# Patient Record
Sex: Female | Born: 1950 | Race: White | Hispanic: No | Smoking: Current every day smoker
Health system: Southern US, Community
[De-identification: ages and names within clinical notes are randomized; demographics above are authoritative.]

## PROBLEM LIST (undated history)

## (undated) ENCOUNTER — Emergency Department (HOSPITAL_COMMUNITY): Discharge status: Absent without leave | Payer: Medicare Other | Source: Home / Self Care

## (undated) DIAGNOSIS — IMO0001 Reserved for inherently not codable concepts without codable children: Secondary | ICD-10-CM

## (undated) DIAGNOSIS — M109 Gout, unspecified: Secondary | ICD-10-CM

## (undated) DIAGNOSIS — E1140 Type 2 diabetes mellitus with diabetic neuropathy, unspecified: Secondary | ICD-10-CM

## (undated) DIAGNOSIS — K219 Gastro-esophageal reflux disease without esophagitis: Secondary | ICD-10-CM

## (undated) DIAGNOSIS — F329 Major depressive disorder, single episode, unspecified: Secondary | ICD-10-CM

## (undated) DIAGNOSIS — R519 Headache, unspecified: Secondary | ICD-10-CM

## (undated) DIAGNOSIS — M1990 Unspecified osteoarthritis, unspecified site: Secondary | ICD-10-CM

## (undated) DIAGNOSIS — L853 Xerosis cutis: Secondary | ICD-10-CM

## (undated) DIAGNOSIS — G629 Polyneuropathy, unspecified: Secondary | ICD-10-CM

## (undated) DIAGNOSIS — D649 Anemia, unspecified: Secondary | ICD-10-CM

## (undated) DIAGNOSIS — K5792 Diverticulitis of intestine, part unspecified, without perforation or abscess without bleeding: Secondary | ICD-10-CM

## (undated) DIAGNOSIS — F32A Depression, unspecified: Secondary | ICD-10-CM

## (undated) DIAGNOSIS — K5900 Constipation, unspecified: Secondary | ICD-10-CM

## (undated) DIAGNOSIS — H547 Unspecified visual loss: Secondary | ICD-10-CM

## (undated) DIAGNOSIS — I10 Essential (primary) hypertension: Secondary | ICD-10-CM

## (undated) DIAGNOSIS — F419 Anxiety disorder, unspecified: Secondary | ICD-10-CM

## (undated) DIAGNOSIS — N189 Chronic kidney disease, unspecified: Secondary | ICD-10-CM

## (undated) DIAGNOSIS — K922 Gastrointestinal hemorrhage, unspecified: Secondary | ICD-10-CM

## (undated) DIAGNOSIS — R51 Headache: Secondary | ICD-10-CM

## (undated) DIAGNOSIS — C4490 Unspecified malignant neoplasm of skin, unspecified: Secondary | ICD-10-CM

## (undated) HISTORY — PX: TONSILLECTOMY: SUR1361

## (undated) HISTORY — DX: Gastro-esophageal reflux disease without esophagitis: K21.9

## (undated) HISTORY — DX: Unspecified malignant neoplasm of skin, unspecified: C44.90

## (undated) HISTORY — DX: Gastrointestinal hemorrhage, unspecified: K92.2

## (undated) HISTORY — DX: Unspecified visual loss: H54.7

## (undated) HISTORY — PX: ABDOMINAL HYSTERECTOMY: SHX81

## (undated) HISTORY — PX: SKIN CANCER EXCISION: SHX779

## (undated) HISTORY — PX: ADENOIDECTOMY: SUR15

## (undated) HISTORY — DX: Essential (primary) hypertension: I10

## (undated) HISTORY — PX: BACK SURGERY: SHX140

## (undated) HISTORY — PX: FOOT SURGERY: SHX648

## (undated) HISTORY — PX: APPENDECTOMY: SHX54

---

## 1999-02-27 ENCOUNTER — Ambulatory Visit (HOSPITAL_COMMUNITY): Admission: RE | Admit: 1999-02-27 | Discharge: 1999-02-27 | Payer: Self-pay | Admitting: *Deleted

## 2009-07-28 ENCOUNTER — Encounter: Payer: Self-pay | Admitting: Cardiology

## 2010-02-09 ENCOUNTER — Encounter: Payer: Self-pay | Admitting: Cardiology

## 2010-02-10 ENCOUNTER — Encounter: Payer: Self-pay | Admitting: Cardiology

## 2010-02-16 ENCOUNTER — Encounter: Payer: Self-pay | Admitting: Cardiology

## 2010-02-17 ENCOUNTER — Ambulatory Visit: Payer: Self-pay | Admitting: Cardiology

## 2010-02-17 ENCOUNTER — Encounter (INDEPENDENT_AMBULATORY_CARE_PROVIDER_SITE_OTHER): Payer: Self-pay | Admitting: *Deleted

## 2010-02-17 DIAGNOSIS — I10 Essential (primary) hypertension: Secondary | ICD-10-CM

## 2010-02-17 DIAGNOSIS — E1121 Type 2 diabetes mellitus with diabetic nephropathy: Secondary | ICD-10-CM | POA: Insufficient documentation

## 2010-02-17 DIAGNOSIS — R072 Precordial pain: Secondary | ICD-10-CM

## 2010-02-17 DIAGNOSIS — R5381 Other malaise: Secondary | ICD-10-CM

## 2010-02-17 DIAGNOSIS — R5383 Other fatigue: Secondary | ICD-10-CM

## 2010-02-17 DIAGNOSIS — R0602 Shortness of breath: Secondary | ICD-10-CM

## 2010-02-25 ENCOUNTER — Ambulatory Visit: Payer: Self-pay | Admitting: Cardiology

## 2010-02-25 ENCOUNTER — Encounter: Payer: Self-pay | Admitting: Cardiology

## 2010-03-03 ENCOUNTER — Encounter: Payer: Self-pay | Admitting: Cardiology

## 2010-03-04 ENCOUNTER — Encounter: Payer: Self-pay | Admitting: Cardiology

## 2010-03-04 DIAGNOSIS — N259 Disorder resulting from impaired renal tubular function, unspecified: Secondary | ICD-10-CM | POA: Insufficient documentation

## 2010-03-18 ENCOUNTER — Ambulatory Visit: Payer: Self-pay | Admitting: Cardiology

## 2010-03-31 ENCOUNTER — Encounter: Payer: Self-pay | Admitting: Cardiology

## 2010-10-06 NOTE — Letter (Signed)
Summary: External Correspondence/ OFFICE VISIT [place]  External Correspondence/ OFFICE VISIT [place]   Imported By: Bartholomew Boards 02/13/2010 14:07:56  _____________________________________________________________________  External Attachment:    Type:   Image     Comment:   External Document

## 2010-10-06 NOTE — Letter (Signed)
Summary: External Correspondence/ VISIT DR. Lowanda Foster  External Correspondence/ VISIT DR. Lowanda Foster   Imported By: Bartholomew Boards 04/10/2010 14:44:53  _____________________________________________________________________  External Attachment:    Type:   Image     Comment:   External Document

## 2010-10-06 NOTE — Miscellaneous (Signed)
Summary: fish oil  Clinical Lists Changes  Medications: Added new medication of FISH OIL 1000 MG CAPS (OMEGA-3 FATTY ACIDS) Take 1 tablet by mouth two times a day

## 2010-10-06 NOTE — Letter (Signed)
Summary: Internal Other/ PATIENT HISTORY FORM  Internal Other/ PATIENT HISTORY FORM   Imported By: Bartholomew Boards 02/18/2010 11:01:40  _____________________________________________________________________  External Attachment:    Type:   Image     Comment:   External Document

## 2010-10-06 NOTE — Miscellaneous (Signed)
Summary: Orders Update - Nephrology  Clinical Lists Changes  Problems: Added new problem of RENAL INSUFFICIENCY (ICD-588.9) Orders: Added new Referral order of Nephrology Referral (Nephro) - Signed

## 2010-10-06 NOTE — Assessment & Plan Note (Signed)
Summary: NP-ABN EKG & ANGINA W/SOB NIDDM   Visit Type:  Initial Consult Primary Provider:  Dr.Cynthia Melina Copa  CC:  some sob with chest pain.  History of Present Illness: the patient is a60-year-old female with history of multiple cardiac risk factors including diabetes mellitus, tobacco use since age 60, family history as well as hypertension. Cholesterol status is unknown. The patient has been referred for 2 months history of increasing shortness of breath upon exertion. The patient works as a Quarry manager and is noticed when she does house chores for her residence that she becomes very dyspneic. Just walking to the mailbox into the dumpster she states makes her dyspneic and also causes substernal chest pain. She states that just pressures heavy but without radiation to the neck or shoulder. There is some associated diaphoresis. Typically her symptoms abate 1-2 minutes after resting. She found that over the last week her symptoms have slightly improved. She also has noticed that her symptoms worsen if she does any exercise soon after eating.  The patient's blood pressure is poorly controlled she states she has difficulty affording her medications. She has lower extremity pain secondary to diabetic neuropathy which has improved with Cymbalta. She denies any palpitations or syncope. She denies any orthopnea PND. Of note is that sometimes her blood pressures over 200 mmHg when she checks it at home.   Current Medications (verified): 1)  Naprosyn 500 Mg Tabs (Naproxen) .... Take 1 Tab Two Times A Day 2)  Amlodipine Besylate 10 Mg Tabs (Amlodipine Besylate) .... Take 1 Tab Daily 3)  Caltrate 600+d Plus 600-400 Mg-Unit Tabs (Calcium Carbonate-Vit D-Min) .... Take 1 Tab Daily 4)  Omeprazole 20 Mg Cpdr (Omeprazole) .... Take 1 Tab Daily 5)  Bystolic 10 Mg Tabs (Nebivolol Hcl) .... Take 1 Tab Daily 6)  Metformin Hcl 500 Mg Tabs (Metformin Hcl) .... Take 1 Tab Two Times A Day 7)  Hydrocodone-Acetaminophen 5-500 Mg  Tabs (Hydrocodone-Acetaminophen) .... Take As Directed For Pain 8)  Alprazolam 0.5 Mg Tabs (Alprazolam) .... Take 1 Tab Two Times A Day 9)  Cymbalta 60 Mg Cpep (Duloxetine Hcl) .... Take 1 Tab Daily 10)  Apidra 100 Unit/ml Soln (Insulin Glulisine) .... 7 Units Before Each Meal 11)  Levemir 100 Unit/ml Soln (Insulin Detemir) .... 40 Units At Bedtime  Allergies (verified): 1)  ! Penicillin  Past History:  Past Medical History: diabetes mellitus Hypertension GERD Back surgery 2 foot surgeries Surgery for skin cancer.  Family History: Reviewed history and no changes required. patient's mother had a CVA and died from heart failure. Father had apparently a leg amputation done died from myocardial infarction she has several siblings who have no coronary artery disease.  Social History: Reviewed history and no changes required. the patient smokes less than one pack a day but she has been smoking since age 24. She works as a Quarry manager.  Review of Systems       The patient complains of fatigue, chest pain, shortness of breath, and muscle weakness.  The patient denies malaise, fever, weight gain/loss, vision loss, decreased hearing, hoarseness, palpitations, prolonged cough, wheezing, sleep apnea, coughing up blood, abdominal pain, blood in stool, nausea, vomiting, diarrhea, heartburn, incontinence, blood in urine, joint pain, leg swelling, rash, skin lesions, headache, fainting, dizziness, depression, anxiety, enlarged lymph nodes, easy bruising or bleeding, and environmental allergies.    Vital Signs:  Patient profile:   60 year old female Height:      64 inches Weight:      193  pounds BMI:     33.25 Pulse rate:   74 / minute BP sitting:   187 / 79  (right arm)  Vitals Entered By: Doretha Sou, CNA (February 17, 2010 1:48 PM)  Physical Exam  Additional Exam:  General: Well-developed, well-nourished in no distress head: Normocephalic and atraumatic eyes PERRLA/EOMI intact, conjunctiva and  lids normal nose: No deformity or lesions mouth normal dentition, normal posterior pharynx neck: Supple, no JVD.  No masses, thyromegaly or abnormal cervical nodes lungs: Normal breath sounds bilaterally without wheezing.  Normal percussion heart: regular rate and rhythm with normal S1 and S2, no S3 or S4.  PMI is normal.  No pathological murmurs abdomen: Normal bowel sounds, abdomen is soft and nontender without masses, organomegaly or hernias noted.  No hepatosplenomegaly musculoskeletal: Back normal, normal gait muscle strength and tone normal pulsus: Pulse is normal in all 4 extremities Extremities: 2+ peripheral pitting edema neurologic: Alert and oriented x 3 skin: Intact without lesions or rashes cervical nodes: No significant adenopathy psychologic: Normal affect    EKG  Procedure date:  02/17/2010  Findings:      normal sinus rhythm. Significant left ventricular hypertrophy by EKG inferior Q waves. Nonspecific ST-T wave changes.  Impression & Recommendations:  Problem # 1:  CHEST PAIN, PRECORDIAL (ICD-786.51) the patient has a high pretest probability for coronary artery disease. I recommended a diagnostic heart catheterization but the patient was initially proceed with medical therapy and a noninvasive workup. I have ordered a dobutamine stress echo.the patient will also be started on Imdur and sublingual nitroglycerin. Her updated medication list for this problem includes:    Amlodipine Besylate 10 Mg Tabs (Amlodipine besylate) .Marland Kitchen... Take 1 tab daily    Bystolic 10 Mg Tabs (Nebivolol hcl) .Marland Kitchen... Take 1 tab daily    Aspirin Ec 325 Mg Tbec (Aspirin) .Marland Kitchen... Take one tablet by mouth daily    Lisinopril 10 Mg Tabs (Lisinopril) .Marland Kitchen... Take one tablet by mouth daily  Orders: T-Comprehensive Metabolic Panel ([id number]) T-CBC No Diff [id number]) T-Lipid Profile [id number]) T-TSH [id number]) Echo- Dobutamine (Dobutamine Echo)  Problem # 2:  SHORTNESS OF BREATH  (ICD-786.05) shortness of breath is likely an angina equivalent although the patient is a long-standing history of tobacco abuse and could have COPD/emphysema. Stress testing is negative she needs a further workup with pulmonary function studies. Her updated medication list for this problem includes:    Amlodipine Besylate 10 Mg Tabs (Amlodipine besylate) .Marland Kitchen... Take 1 tab daily    Bystolic 10 Mg Tabs (Nebivolol hcl) .Marland Kitchen... Take 1 tab daily    Aspirin Ec 325 Mg Tbec (Aspirin) .Marland Kitchen... Take one tablet by mouth daily    Lisinopril 10 Mg Tabs (Lisinopril) .Marland Kitchen... Take one tablet by mouth daily    Chlorthalidone 25 Mg Tabs (Chlorthalidone) .Marland Kitchen... Take one tablet by mouth daily  Orders: T-Comprehensive Metabolic Panel ([id number]) T-CBC No Diff [id number]) T-Lipid Profile [id number]) T-TSH [id number]) Echo- Dobutamine (Dobutamine Echo)  Problem # 3:  HYPERTENSION (ICD-401.9) blood pressure is poorly controlled and we will add lisinopril 10 milligrams p.o. q. daily to her medical regimen as well as chlorthalidone 25 mg p.o. q. daily. Worked work will be obtained in one week including a CBC and electrolyte panel. Her updated medication list for this problem includes:    Amlodipine Besylate 10 Mg Tabs (Amlodipine besylate) .Marland Kitchen... Take 1 tab daily    Bystolic 10 Mg Tabs (Nebivolol hcl) .Marland Kitchen... Take 1 tab daily    Aspirin Ec 325 Mg Tbec (Aspirin) .Marland Kitchen... Take  one tablet by mouth daily    Lisinopril 10 Mg Tabs (Lisinopril) .Marland Kitchen... Take one tablet by mouth daily    Chlorthalidone 25 Mg Tabs (Chlorthalidone) .Marland Kitchen... Take one tablet by mouth daily  Patient Instructions: 1)  Your physician discussed the risks, benefits and indications for preventive aspirin therapy. It is recommended that you start (or continue) taking 325 mg of aspirin a day. 2)  Start Lisinopirl 10mg  by mouth once daily. 3)  Start Chlorthalidone 25mg  by mouth every morning. 4)  Your physician recommends that you go to the [facility] for lab  work: DO IN 1 WEEK. Do not eat or drink after midnight.  5)  Your physician has requested that you have a dobutamine echocardiogram.  For further information please visit [email or web address].  Please follow instruction sheet as given. 6)  Follow up appt with Dr. Lutricia Feil on Wed, March 18, 2010 at 1:30pm. Prescriptions: CHLORTHALIDONE 25 MG TABS (CHLORTHALIDONE) Take one tablet by mouth daily  #30 x 6   Entered by:   Gurney Maxin, RN, BSN   Authorized by:   Terald Sleeper, MD, [facility]   Signed by:   Gurney Maxin, RN, BSN on 02/17/2010   Method used:   Electronically to        [facility] (retail)       [address]       [place], [place]  [address]       Ph: [phone]       Fax: [phone]   [id number]   Handout requested. LISINOPRIL 10 MG TABS (LISINOPRIL) Take one tablet by mouth daily  #30 x 6   Entered by:   Gurney Maxin, RN, BSN   Authorized by:   Terald Sleeper, MD, [facility]   Signed by:   Gurney Maxin, RN, BSN on 02/17/2010   Method used:   Electronically to        [facility] (retail)       [address]       [place], [place]  [address]       Ph: [phone]       Fax: [phone]   RxID:   [id number]   Handout requested.

## 2010-10-06 NOTE — Medication Information (Signed)
Summary: RX Folder/ [facility] MED LIST   Imported By: Bartholomew Boards 02/13/2010 14:06:21  _____________________________________________________________________  External Attachment:    Type:   Image     Comment:   External Document

## 2010-10-06 NOTE — Letter (Signed)
Summary: Dobutamine Echocardiogram  [facility] [place]. [address]   [place], [place] [address]   Phone: [phone]  Fax: [phone]      [facility]  Dobutamine Echocardiogram    [facility]  Appointment Date:_  Appointment Time:_   Your doctor has ordered a Dobutamine echo to help determine the condition of our heart. If you take blood pressure medication, ask your doctor if you should take your medications the day of the procedure. Do not eat and drink 4 hours before the test is scheduled.  You will be asked to undress from the waist up and given a hospital gown to wear, so dress comfortaly from the waist down.  You will need to register at the Outpatient Department at [facility], located at the front enttrance of the hospital. Make sure to bring your insurance information and a form of ID.  Your appointment will last approximately one and one-half hours.   Do not take Bystolic for 24 hours before test.  If the results of your test are normal or stable, you will receive a letter. If they are abnormal, the nurse will contact you by phone.

## 2010-10-06 NOTE — Assessment & Plan Note (Signed)
Summary: 1 MO F/U PER 02-20-23 OV-JM   Visit Type:  Follow-up Primary Provider:  Dr.Cynthia Melina Copa   History of Present Illness: the patient is a 60 year old female with history of multiple cardiac risk factors including diabetes mellitus and tobacco use as well as a family history and hypertension.  The patient was consulted for shortness of breath and chest pain of 614 2011.  A dobutamine stress echocardiographic study showed however no ischemia and LV function was normal.  However I ordered blood work and the patient has a significant anemia with a hemoglobin of 8.7 in addition her creatinine is 3.09 with EGFR 15 ml/min consistent with chronic renal failure.  Potassium is 5.0.  Glucose is 173.I do not have previous blood work on this patient.  The patient currently does not report chest pain but does report ongoing shortness of breath.  She also has evidence of COPD.  Her blood pressure is better controlled but not optimal.  She has developed a cough secondary to lisinopril which was previously started.  Preventive Screening-Counseling & Management  Alcohol-Tobacco     Smoking Status: current     Smoking Cessation Counseling: yes     Packs/Day: 1/2 PPD  Current Medications (verified): 1)  Naprosyn 500 Mg Tabs (Naproxen) .... Take 1 Tablet By Mouth Two Times A Day 2)  Amlodipine Besylate 10 Mg Tabs (Amlodipine Besylate) .... Take 1 Tablet By Mouth Once A Day 3)  Caltrate 600+d Plus 600-400 Mg-Unit Tabs (Calcium Carbonate-Vit D-Min) .... Take 1 Tablet By Mouth Two Times A Day 4)  Omeprazole 20 Mg Cpdr (Omeprazole) .... Take 1 Tablet By Mouth Once A Day 5)  Bystolic 10 Mg Tabs (Nebivolol Hcl) .... Take 1 Tablet By Mouth Once A Day 6)  Metformin Hcl 500 Mg Tabs (Metformin Hcl) .... Take 2 Tablet By Mouth Two Times A Day 7)  Hydrocodone-Acetaminophen 5-500 Mg Tabs (Hydrocodone-Acetaminophen) .... Take 1 Tablet By Mouth Three Times A Day As Needed 8)  Alprazolam 0.5 Mg Tabs (Alprazolam) .... Take 1  Tablet By Mouth Two Times A Day 9)  Cymbalta 60 Mg Cpep (Duloxetine Hcl) .... Take 1 Tablet By Mouth Once A Day 10)  Apidra 100 Unit/ml Soln (Insulin Glulisine) .... 7 Units Before Each Meal 11)  Levemir 100 Unit/ml Soln (Insulin Detemir) .... 40 Units At Bedtime 12)  Aspirin Ec 325 Mg Tbec (Aspirin) .... Take One Tablet By Mouth Daily 13)  Chlorthalidone 50 Mg Tabs (Chlorthalidone) .... Take 1 Tablet By Mouth Once A Day 14)  Fish Oil 1000 Mg Caps (Omega-3 Fatty Acids) .... Take 1 Tablet By Mouth Two Times A Day 15)  Hydralazine Hcl 10 Mg Tabs (Hydralazine Hcl) .... Take 2 Tabs (20mg ) Three Times A Day  Allergies: 1)  ! Penicillin  Comments:  Nurse/Medical Assistant: The patient's medication bottles and allergies were reviewed with the patient and were updated in the Medication and Allergy Lists.  Past History:  Past Medical History: Last updated: February 19, 2010 diabetes mellitus Hypertension GERD Back surgery 2 foot surgeries Surgery for skin cancer.  Past Surgical History: Last updated: 02/19/2010 Hysterectomy Tonsillectomy Adenoidectomy operations on feet  Family History: Last updated: 2010/02/19 Father died of heart attack age 8 Mother had multiple and myeloma & diabetes  Social History: Last updated: February 19, 2010 Has no children  Risk Factors: Smoking Status: current (03/18/2010) Packs/Day: 1/2 PPD (03/18/2010)  Social History: Smoking Status:  current Packs/Day:  1/2 PPD  Review of Systems       The patient complains of  shortness of breath.  The patient denies fatigue, malaise, fever, weight gain/loss, vision loss, decreased hearing, hoarseness, chest pain, palpitations, prolonged cough, wheezing, sleep apnea, coughing up blood, abdominal pain, blood in stool, nausea, vomiting, diarrhea, heartburn, incontinence, blood in urine, muscle weakness, joint pain, leg swelling, rash, skin lesions, headache, fainting, dizziness, depression, anxiety, enlarged lymph nodes,  easy bruising or bleeding, and environmental allergies.    Vital Signs:  Patient profile:   60 year old female Height:      64 inches Weight:      188 pounds Pulse rate:   68 / minute BP sitting:   150 / 78  (left arm) Cuff size:   large  Vitals Entered By: Georgina Peer (March 18, 2010 1:19 PM)  Physical Exam  Additional Exam:  General: Well-developed, well-nourished in no distress head: Normocephalic and atraumatic eyes PERRLA/EOMI intact, conjunctiva and lids normal nose: No deformity or lesions mouth normal dentition, normal posterior pharynx neck: Supple, no JVD.  No masses, thyromegaly or abnormal cervical nodes lungs: Normal breath sounds bilaterally without wheezing.  Normal percussion heart: regular rate and rhythm with normal S1 and S2, no S3 or S4.  PMI is normal.  No pathological murmurs abdomen: Normal bowel sounds, abdomen is soft and nontender without masses, organomegaly or hernias noted.  No hepatosplenomegaly musculoskeletal: Back normal, normal gait muscle strength and tone normal pulsus: Pulse is normal in all 4 extremities Extremities: 2+ peripheral pitting edema neurologic: Alert and oriented x 3 skin: Intact without lesions or rashes cervical nodes: No significant adenopathy psychologic: Normal affect    Impression & Recommendations:  Problem # 1:  RENAL INSUFFICIENCY (ICD-588.9) patient will be referred to nephrology.  I suspect the renal insufficiencies chronic and what likely related to diabetes and hypertension although she needs a complete workup.  Problem # 2:  SHORTNESS OF BREATH (ICD-786.05) shortness of breath is likely multifactorial related to her anemia which is significant, hypertension which is not controlled and COPD.  However, the patient is noted to havethe patient does not appear to have high risk anatomybased on noninvasive stress testing. The following medications were removed from the medication list:    Lisinopril 10 Mg Tabs  (Lisinopril) .Marland Kitchen... Take one tablet by mouth daily Her updated medication list for this problem includes:    Amlodipine Besylate 10 Mg Tabs (Amlodipine besylate) .Marland Kitchen... Take 1 tablet by mouth once a day    Bystolic 10 Mg Tabs (Nebivolol hcl) .Marland Kitchen... Take 1 tablet by mouth once a day    Aspirin Ec 325 Mg Tbec (Aspirin) .Marland Kitchen... Take one tablet by mouth daily    Chlorthalidone 50 Mg Tabs (Chlorthalidone) .Marland Kitchen... Take 1 tablet by mouth once a day  Problem # 3:  HYPERTENSION (ICD-401.9) will discontinue lisinopril particularly in light of her renal insufficiency and start hydralazine 20 mg p.o. t.i.d. The following medications were removed from the medication list:    Lisinopril 10 Mg Tabs (Lisinopril) .Marland Kitchen... Take one tablet by mouth daily Her updated medication list for this problem includes:    Amlodipine Besylate 10 Mg Tabs (Amlodipine besylate) .Marland Kitchen... Take 1 tablet by mouth once a day    Bystolic 10 Mg Tabs (Nebivolol hcl) .Marland Kitchen... Take 1 tablet by mouth once a day    Aspirin Ec 325 Mg Tbec (Aspirin) .Marland Kitchen... Take one tablet by mouth daily    Chlorthalidone 50 Mg Tabs (Chlorthalidone) .Marland Kitchen... Take 1 tablet by mouth once a day    Hydralazine Hcl 10 Mg Tabs (Hydralazine hcl) .Marland KitchenMarland KitchenMarland KitchenMarland Kitchen  Take 2 tabs (20mg ) three times a day  Patient Instructions: 1)  Stop Lisinopril 2)  Change to Hydralazine 20mg  three times a day  3)  Increase Chlorthalidone to 50mg  daily 4)  Follow up in  3 months Prescriptions: HYDRALAZINE HCL 10 MG TABS (HYDRALAZINE HCL) take 2 tabs (20mg ) three times a day  #180 x 6   Entered by:   Lovina Reach, LPN   Authorized by:   Terald Sleeper, MD, [facility]   Signed by:   Lovina Reach, LPN on [id number]   Method used:   Electronically to        [facility] (retail)       [address]       [place], [place]  [address]       Ph: [phone]       Fax: [phone]   [id number]   Handout requested. CHLORTHALIDONE 50 MG TABS (CHLORTHALIDONE) Take 1 tablet  by mouth once a day  #30 x 6   Entered by:   Lovina Reach, LPN   Authorized by:   Terald Sleeper, MD, [facility]   Signed by:   Lovina Reach, LPN on [id number]   Method used:   Electronically to        [facility] (retail)       [address]       [place], [place]  [address]       Ph: [phone]       Fax: [phone]   RxID:   [id number]

## 2011-03-02 ENCOUNTER — Encounter: Payer: Self-pay | Admitting: Cardiology

## 2013-06-15 ENCOUNTER — Encounter (HOSPITAL_COMMUNITY): Payer: Self-pay | Admitting: Pharmacy Technician

## 2013-06-27 NOTE — Patient Instructions (Signed)
Your procedure is scheduled on: 07/02/2013  Report to [facility] at  0900  AM.  Call this number if you have problems the morning of surgery: [phone]   Do not eat food or drink liquids :After Midnight.      Take these medicines the morning of surgery with A SIP OF WATER: xanax, cymbalta, norvasc, percocet, protonix   Do not wear jewelry, make-up or nail polish.  Do not wear lotions, powders, or perfumes.   Do not shave 48 hours prior to surgery.  Do not bring valuables to the hospital.  Contacts, dentures or bridgework may not be worn into surgery.  Leave suitcase in the car. After surgery it may be brought to your room.  For patients admitted to the hospital, checkout time is 11:00 AM the day of discharge.   Patients discharged the day of surgery will not be allowed to drive home.  :     Please read over the following fact sheets that you were given: Coughing and Deep Breathing, Surgical Site Infection Prevention, Anesthesia Post-op Instructions and Care and Recovery After Surgery    Cataract A cataract is a clouding of the lens of the eye. When a lens becomes cloudy, vision is reduced based on the degree and nature of the clouding. Many cataracts reduce vision to some degree. Some cataracts make people more near-sighted as they develop. Other cataracts increase glare. Cataracts that are ignored and become worse can sometimes look white. The white color can be seen through the pupil. CAUSES   Aging. However, cataracts may occur at any age, even in newborns.   Certain drugs.   Trauma to the eye.   Certain diseases such as diabetes.   Specific eye diseases such as chronic inflammation inside the eye or a sudden attack of a rare form of glaucoma.   Inherited or acquired medical problems.  SYMPTOMS   Gradual, progressive drop in vision in the affected eye.   Severe, rapid visual loss. This most often happens when trauma is the cause.  DIAGNOSIS  To detect a cataract, an eye  doctor examines the lens. Cataracts are best diagnosed with an exam of the eyes with the pupils enlarged (dilated) by drops.  TREATMENT  For an early cataract, vision may improve by using different eyeglasses or stronger lighting. If that does not help your vision, surgery is the only effective treatment. A cataract needs to be surgically removed when vision loss interferes with your everyday activities, such as driving, reading, or watching TV. A cataract may also have to be removed if it prevents examination or treatment of another eye problem. Surgery removes the cloudy lens and usually replaces it with a substitute lens (intraocular lens, IOL).  At a time when both you and your doctor agree, the cataract will be surgically removed. If you have cataracts in both eyes, only one is usually removed at a time. This allows the operated eye to heal and be out of danger from any possible problems after surgery (such as infection or poor wound healing). In rare cases, a cataract may be doing damage to your eye. In these cases, your caregiver may advise surgical removal right away. The vast majority of people who have cataract surgery have better vision afterward. HOME CARE INSTRUCTIONS  If you are not planning surgery, you may be asked to do the following:  Use different eyeglasses.   Use stronger or brighter lighting.   Ask your eye doctor about reducing your medicine dose  or changing medicines if it is thought that a medicine caused your cataract. Changing medicines does not make the cataract go away on its own.   Become familiar with your surroundings. Poor vision can lead to injury. Avoid bumping into things on the affected side. You are at a higher risk for tripping or falling.   Exercise extreme care when driving or operating machinery.   Wear sunglasses if you are sensitive to bright light or experiencing problems with glare.  SEEK IMMEDIATE MEDICAL CARE IF:   You have a worsening or sudden  vision loss.   You notice redness, swelling, or increasing pain in the eye.   You have a fever.  Document Released: 08/23/2005 Document Revised: 08/12/2011 Document Reviewed: 04/16/2011 [facility] Patient Information 2012 [place].PATIENT INSTRUCTIONS POST-ANESTHESIA  IMMEDIATELY FOLLOWING SURGERY:  Do not drive or operate machinery for the first twenty four hours after surgery.  Do not make any important decisions for twenty four hours after surgery or while taking narcotic pain medications or sedatives.  If you develop intractable nausea and vomiting or a severe headache please notify your doctor immediately.  FOLLOW-UP:  Please make an appointment with your surgeon as instructed. You do not need to follow up with anesthesia unless specifically instructed to do so.  WOUND CARE INSTRUCTIONS (if applicable):  Keep a dry clean dressing on the anesthesia/puncture wound site if there is drainage.  Once the wound has quit draining you may leave it open to air.  Generally you should leave the bandage intact for twenty four hours unless there is drainage.  If the epidural site drains for more than 36-48 hours please call the anesthesia department.  QUESTIONS?:  Please feel free to call your physician or the hospital operator if you have any questions, and they will be happy to assist you.

## 2013-06-28 ENCOUNTER — Encounter (HOSPITAL_COMMUNITY)
Admission: RE | Admit: 2013-06-28 | Discharge: 2013-06-28 | Disposition: A | Discharge status: Home / Self Care | Payer: Medicare Other | Source: Ambulatory Visit | Attending: Ophthalmology | Admitting: Ophthalmology

## 2013-06-28 ENCOUNTER — Encounter (HOSPITAL_COMMUNITY): Payer: Self-pay

## 2013-06-28 ENCOUNTER — Other Ambulatory Visit: Payer: Self-pay

## 2013-06-28 DIAGNOSIS — Z01818 Encounter for other preprocedural examination: Secondary | ICD-10-CM | POA: Insufficient documentation

## 2013-06-28 DIAGNOSIS — Z01812 Encounter for preprocedural laboratory examination: Secondary | ICD-10-CM | POA: Insufficient documentation

## 2013-06-28 HISTORY — DX: Major depressive disorder, single episode, unspecified: F32.9

## 2013-06-28 HISTORY — DX: Anemia, unspecified: D64.9

## 2013-06-28 HISTORY — DX: Gout, unspecified: M10.9

## 2013-06-28 HISTORY — DX: Depression, unspecified: F32.A

## 2013-06-28 HISTORY — DX: Chronic kidney disease, unspecified: N18.9

## 2013-06-28 HISTORY — DX: Anxiety disorder, unspecified: F41.9

## 2013-06-28 HISTORY — DX: Unspecified osteoarthritis, unspecified site: M19.90

## 2013-06-28 LAB — BASIC METABOLIC PANEL
BUN: 41 mg/dL — ABNORMAL HIGH (ref 6–23)
Calcium: 9.9 mg/dL (ref 8.4–10.5)
Creatinine, Ser: 2.51 mg/dL — ABNORMAL HIGH (ref 0.50–1.10)
GFR calc Af Amer: 22 mL/min — ABNORMAL LOW (ref >90)
GFR calc non Af Amer: 19 mL/min — ABNORMAL LOW (ref >90)
Glucose, Bld: 181 mg/dL — ABNORMAL HIGH (ref 70–99)
Potassium: 4.7 mEq/L (ref 3.5–5.1)

## 2013-06-28 LAB — HEMOGLOBIN AND HEMATOCRIT, BLOOD: Hemoglobin: 9.1 g/dL — ABNORMAL LOW (ref 12.0–15.0)

## 2013-06-29 MED ORDER — LIDOCAINE HCL (PF) 1 % IJ SOLN
INTRAMUSCULAR | Status: AC
  Filled 2013-06-29: qty 2

## 2013-06-29 MED ORDER — CYCLOPENTOLATE-PHENYLEPHRINE OP SOLN OPTIME - NO CHARGE
OPHTHALMIC | Status: AC
  Filled 2013-06-29: qty 2

## 2013-06-29 MED ORDER — LIDOCAINE HCL 3.5 % OP GEL
OPHTHALMIC | Status: AC
  Filled 2013-06-29: qty 1

## 2013-06-29 MED ORDER — PHENYLEPHRINE HCL 2.5 % OP SOLN
OPHTHALMIC | Status: AC
  Filled 2013-06-29: qty 15

## 2013-06-29 MED ORDER — TETRACAINE HCL 0.5 % OP SOLN
OPHTHALMIC | Status: AC
  Filled 2013-06-29: qty 2

## 2013-06-29 MED ORDER — NEOMYCIN-POLYMYXIN-DEXAMETH 3.5-10000-0.1 OP SUSP
OPHTHALMIC | Status: AC
  Filled 2013-06-29: qty 5

## 2013-06-29 NOTE — OR Nursing (Signed)
Abnormal labs reported to Dr. Patsey Berthold,   order given.

## 2013-07-02 ENCOUNTER — Ambulatory Visit (HOSPITAL_COMMUNITY)
Admission: RE | Admit: 2013-07-02 | Discharge: 2013-07-02 | Disposition: A | Discharge status: Home / Self Care | Payer: Medicare Other | Source: Ambulatory Visit | Attending: Ophthalmology | Admitting: Ophthalmology

## 2013-07-02 ENCOUNTER — Encounter (HOSPITAL_COMMUNITY): Payer: Medicare Other | Admitting: Anesthesiology

## 2013-07-02 ENCOUNTER — Encounter (HOSPITAL_COMMUNITY): Payer: Self-pay | Admitting: *Deleted

## 2013-07-02 ENCOUNTER — Encounter (HOSPITAL_COMMUNITY)
Admission: RE | Disposition: A | Discharge status: Home / Self Care | Payer: Self-pay | Source: Ambulatory Visit | Attending: Ophthalmology

## 2013-07-02 ENCOUNTER — Ambulatory Visit (HOSPITAL_COMMUNITY): Payer: Medicare Other | Admitting: Anesthesiology

## 2013-07-02 DIAGNOSIS — Z01812 Encounter for preprocedural laboratory examination: Secondary | ICD-10-CM | POA: Insufficient documentation

## 2013-07-02 DIAGNOSIS — E119 Type 2 diabetes mellitus without complications: Secondary | ICD-10-CM | POA: Insufficient documentation

## 2013-07-02 DIAGNOSIS — H2589 Other age-related cataract: Secondary | ICD-10-CM | POA: Insufficient documentation

## 2013-07-02 DIAGNOSIS — I10 Essential (primary) hypertension: Secondary | ICD-10-CM | POA: Insufficient documentation

## 2013-07-02 HISTORY — PX: CATARACT EXTRACTION W/PHACO: SHX586

## 2013-07-02 SURGERY — PHACOEMULSIFICATION, CATARACT, WITH IOL INSERTION
Anesthesia: Monitor Anesthesia Care | Site: Eye | Laterality: Left | Wound class: Clean

## 2013-07-02 MED ORDER — MIDAZOLAM HCL 2 MG/2ML IJ SOLN
INTRAMUSCULAR | Status: AC
  Filled 2013-07-02: qty 2

## 2013-07-02 MED ORDER — SODIUM CHLORIDE 0.9 % IV SOLN
Freq: Once | INTRAVENOUS | Status: AC
  Administered 2013-07-02: 500 mL via INTRAVENOUS

## 2013-07-02 MED ORDER — FENTANYL CITRATE 0.05 MG/ML IJ SOLN
INTRAMUSCULAR | Status: AC
  Filled 2013-07-02: qty 2

## 2013-07-02 MED ORDER — EPINEPHRINE HCL 1 MG/ML IJ SOLN
INTRAOCULAR | Status: DC | PRN
  Administered 2013-07-02: 10:00:00

## 2013-07-02 MED ORDER — EPINEPHRINE HCL 1 MG/ML IJ SOLN
INTRAMUSCULAR | Status: AC
  Filled 2013-07-02: qty 1

## 2013-07-02 MED ORDER — CYCLOPENTOLATE-PHENYLEPHRINE 0.2-1 % OP SOLN
1.0000 [drp] | OPHTHALMIC | Status: AC
Start: 2013-07-02 — End: 2013-07-02
  Administered 2013-07-02 (×3): 1 [drp] via OPHTHALMIC

## 2013-07-02 MED ORDER — MIDAZOLAM HCL 5 MG/5ML IJ SOLN
INTRAMUSCULAR | Status: DC | PRN
  Administered 2013-07-02: 2 mg via INTRAVENOUS

## 2013-07-02 MED ORDER — MIDAZOLAM HCL 2 MG/2ML IJ SOLN
1.0000 mg | INTRAMUSCULAR | Status: DC | PRN
  Administered 2013-07-02: 2 mg via INTRAVENOUS

## 2013-07-02 MED ORDER — POVIDONE-IODINE 5 % OP SOLN
OPHTHALMIC | Status: DC | PRN
  Administered 2013-07-02: 1 via OPHTHALMIC

## 2013-07-02 MED ORDER — LIDOCAINE HCL (PF) 1 % IJ SOLN
INTRAMUSCULAR | Status: DC | PRN
  Administered 2013-07-02: .3 mL

## 2013-07-02 MED ORDER — PROVISC 10 MG/ML IO SOLN
INTRAOCULAR | Status: DC | PRN
  Administered 2013-07-02: 8.5 mg via INTRAOCULAR

## 2013-07-02 MED ORDER — LACTATED RINGERS IV SOLN: INTRAVENOUS | Status: DC

## 2013-07-02 MED ORDER — LIDOCAINE 3.5 % OP GEL OPTIME - NO CHARGE
OPHTHALMIC | Status: DC | PRN
  Administered 2013-07-02: 2 [drp] via OPHTHALMIC

## 2013-07-02 MED ORDER — FENTANYL CITRATE 0.05 MG/ML IJ SOLN
25.0000 ug | INTRAMUSCULAR | Status: AC
  Administered 2013-07-02 (×2): 25 ug via INTRAVENOUS

## 2013-07-02 MED ORDER — LIDOCAINE HCL 3.5 % OP GEL
1.0000 "application " | Freq: Once | OPHTHALMIC | Status: AC
  Administered 2013-07-02: 1 via OPHTHALMIC

## 2013-07-02 MED ORDER — BSS IO SOLN
INTRAOCULAR | Status: DC | PRN
  Administered 2013-07-02: 15 mL via INTRAOCULAR

## 2013-07-02 MED ORDER — TETRACAINE HCL 0.5 % OP SOLN
1.0000 [drp] | OPHTHALMIC | Status: AC
  Administered 2013-07-02 (×3): 1 [drp] via OPHTHALMIC

## 2013-07-02 MED ORDER — SODIUM CHLORIDE 0.9 % IV SOLN
INTRAVENOUS | Status: DC | PRN
  Administered 2013-07-02: 10:00:00 via INTRAVENOUS

## 2013-07-02 MED ORDER — NEOMYCIN-POLYMYXIN-DEXAMETH 3.5-10000-0.1 OP SUSP
OPHTHALMIC | Status: DC | PRN
  Administered 2013-07-02: 2 [drp] via OPHTHALMIC

## 2013-07-02 MED ORDER — PHENYLEPHRINE HCL 2.5 % OP SOLN
1.0000 [drp] | OPHTHALMIC | Status: AC
  Administered 2013-07-02 (×3): 1 [drp] via OPHTHALMIC

## 2013-07-02 SURGICAL SUPPLY — 31 items
CAPSULAR TENSION RING-AMO (OPHTHALMIC RELATED) IMPLANT
CLOTH BEACON ORANGE TIMEOUT ST (SAFETY) ×1 IMPLANT
EYE SHIELD UNIVERSAL CLEAR (GAUZE/BANDAGES/DRESSINGS) ×1 IMPLANT
GLOVE BIO SURGEON STRL SZ 6.5 (GLOVE) IMPLANT
GLOVE BIOGEL PI IND STRL 6.5 (GLOVE) ×1 IMPLANT
GLOVE BIOGEL PI IND STRL 7.0 (GLOVE) IMPLANT
GLOVE BIOGEL PI IND STRL 7.5 (GLOVE) IMPLANT
GLOVE BIOGEL PI INDICATOR 6.5 (GLOVE) ×1
GLOVE BIOGEL PI INDICATOR 7.0 (GLOVE) ×1
GLOVE BIOGEL PI INDICATOR 7.5 (GLOVE)
GLOVE ECLIPSE 6.5 STRL STRAW (GLOVE) IMPLANT
GLOVE ECLIPSE 7.0 STRL STRAW (GLOVE) IMPLANT
GLOVE ECLIPSE 7.5 STRL STRAW (GLOVE) IMPLANT
GLOVE EXAM NITRILE LRG STRL (GLOVE) IMPLANT
GLOVE EXAM NITRILE MD LF STRL (GLOVE) IMPLANT
GLOVE SKINSENSE NS SZ6.5 (GLOVE)
GLOVE SKINSENSE NS SZ7.0 (GLOVE)
GLOVE SKINSENSE STRL SZ6.5 (GLOVE) IMPLANT
GLOVE SKINSENSE STRL SZ7.0 (GLOVE) IMPLANT
KIT VITRECTOMY (OPHTHALMIC RELATED) IMPLANT
PAD ARMBOARD 7.5X6 YLW CONV (MISCELLANEOUS) ×1 IMPLANT
PROC W NO LENS (INTRAOCULAR LENS)
PROC W SPEC LENS (INTRAOCULAR LENS)
PROCESS W NO LENS (INTRAOCULAR LENS) IMPLANT
PROCESS W SPEC LENS (INTRAOCULAR LENS) IMPLANT
RING MALYGIN (MISCELLANEOUS) IMPLANT
SIGHTPATH CAT PROC W REG LENS (Ophthalmic Related) ×2 IMPLANT
SYR TB 1ML LL NO SAFETY (SYRINGE) ×1 IMPLANT
TAPE CLOTH SOFT 2X10 (GAUZE/BANDAGES/DRESSINGS) ×1 IMPLANT
VISCOELASTIC ADDITIONAL (OPHTHALMIC RELATED) IMPLANT
WATER STERILE IRR 250ML POUR (IV SOLUTION) ×1 IMPLANT

## 2013-07-02 NOTE — Op Note (Signed)
Date of Admission: 07/02/2013  Date of Surgery: 07/02/2013  Pre-Op Dx: Cataract  Left  Eye  Post-Op Dx: Combined Cataract  Left  Eye,  Dx Code 366.19  Surgeon: [name], M.D.  Assistants: None  Anesthesia: Topical with MAC  Indications: Painless, progressive loss of vision with compromise of daily activities.  Surgery: Cataract Extraction with Intraocular lens Implant Left Eye  Discription: The patient had dilating drops and viscous lidocaine placed into the left eye in the pre-op holding area. After transfer to the operating room, a time out was performed. The patient was then prepped and draped. Beginning with a 67 degree blade a paracentesis port was made at the surgeon's 2 o'clock position. The anterior chamber was then filled with 1% non-preserved lidocaine. This was followed by filling the anterior chamber with Provisc. A 2.46mm keratome blade was used to make a clear corneal incision at the temporal limbus. A bent cystatome needle was used to create a continuous tear capsulotomy. Hydrodissection was performed with balanced salt solution on a Fine canula. The lens nucleus was then removed using the phacoemulsification handpiece. Residual cortex was removed with the I&A handpiece. The anterior chamber and capsular bag were refilled with Provisc. A posterior chamber intraocular lens was placed into the capsular bag with it's injector. The implant was positioned with the Kuglan hook. The Provisc was then removed from the anterior chamber and capsular bag with the I&A handpiece. Stromal hydration of the main incision and paracentesis port was performed with BSS on a Fine canula. The wounds were tested for leak which was negative. The patient tolerated the procedure well. There were no operative complications. The patient was then transferred to the recovery room in stable condition.  Complications: None  Specimen: None  EBL: None  Prosthetic device: B&L enVista, MX60, power 16.0D, SN  [id number].

## 2013-07-02 NOTE — H&P (Signed)
I have reviewed the H&P, the patient was re-examined, and I have identified no interval changes in medical condition and plan of care since the history and physical of record  

## 2013-07-02 NOTE — Transfer of Care (Signed)
Immediate Anesthesia Transfer of Care Note  Patient: Tanya Lucero  Procedure(s) Performed: Procedure(s) with comments: CATARACT EXTRACTION PHACO AND INTRAOCULAR LENS PLACEMENT (IOC) (Left) - CDE:  6.46  Patient Location: Short Stay  Anesthesia Type:MAC  Level of Consciousness: awake, alert , oriented and patient cooperative  Airway & Oxygen Therapy: Patient Spontanous Breathing  Post-op Assessment: Report given to PACU RN  Post vital signs: Reviewed and stable  Complications: No apparent anesthesia complications

## 2013-07-02 NOTE — Anesthesia Postprocedure Evaluation (Signed)
  Anesthesia Post-op Note  Patient: Tanya Lucero  Procedure(s) Performed: Procedure(s) with comments: CATARACT EXTRACTION PHACO AND INTRAOCULAR LENS PLACEMENT (IOC) (Left) - CDE:  6.46  Patient Location: Short Stay  Anesthesia Type:MAC  Level of Consciousness: awake, alert , oriented and patient cooperative  Airway and Oxygen Therapy: Patient Spontanous Breathing  Post-op Pain: none  Post-op Assessment: Post-op Vital signs reviewed, Patient's Cardiovascular Status Stable, Respiratory Function Stable, Patent Airway and Pain level controlled  Post-op Vital Signs: Reviewed and stable  Complications: No apparent anesthesia complications

## 2013-07-02 NOTE — Anesthesia Preprocedure Evaluation (Signed)
Anesthesia Evaluation  Patient identified by MRN, date of birth, ID band Patient awake    Reviewed: Allergy & Precautions, H&P , NPO status , Patient's Chart, lab work & pertinent test results  Airway Mallampati: II TM Distance: >3 FB     Dental  (+) Teeth Intact   Pulmonary shortness of breath,  breath sounds clear to auscultation        Cardiovascular hypertension, Pt. on medications Rhythm:Regular Rate:Normal     Neuro/Psych PSYCHIATRIC DISORDERS Anxiety Depression    GI/Hepatic GERD-  Medicated,  Endo/Other  diabetes, Type 2, Oral Hypoglycemic Agents  Renal/GU Renal disease     Musculoskeletal   Abdominal   Peds  Hematology   Anesthesia Other Findings   Reproductive/Obstetrics                           Anesthesia Physical Anesthesia Plan  ASA: III  Anesthesia Plan: MAC   Post-op Pain Management:    Induction: Intravenous  Airway Management Planned: Nasal Cannula  Additional Equipment:   Intra-op Plan:   Post-operative Plan:   Informed Consent: I have reviewed the patients History and Physical, chart, labs and discussed the procedure including the risks, benefits and alternatives for the proposed anesthesia with the patient or authorized representative who has indicated his/her understanding and acceptance.     Plan Discussed with:   Anesthesia Plan Comments:         Anesthesia Quick Evaluation

## 2013-07-04 ENCOUNTER — Encounter (HOSPITAL_COMMUNITY): Payer: Self-pay | Admitting: Ophthalmology

## 2013-07-09 ENCOUNTER — Encounter (HOSPITAL_COMMUNITY): Payer: Self-pay | Admitting: Pharmacy Technician

## 2013-07-10 ENCOUNTER — Encounter (HOSPITAL_COMMUNITY): Payer: Self-pay | Admitting: *Deleted

## 2013-07-10 NOTE — Progress Notes (Signed)
Pt's pre-op labwork showed Hgb 9.1 and Hmt 26.2. Dr. Patsey Berthold made aware, no orders given.

## 2013-07-11 ENCOUNTER — Encounter (HOSPITAL_COMMUNITY)
Admission: RE | Admit: 2013-07-11 | Discharge: 2013-07-11 | Disposition: A | Discharge status: Home / Self Care | Payer: Medicare Other | Source: Ambulatory Visit

## 2013-07-11 MED ORDER — TETRACAINE HCL 0.5 % OP SOLN
OPHTHALMIC | Status: AC
  Filled 2013-07-11: qty 2

## 2013-07-11 MED ORDER — NEOMYCIN-POLYMYXIN-DEXAMETH 3.5-10000-0.1 OP SUSP
OPHTHALMIC | Status: AC
  Filled 2013-07-11: qty 5

## 2013-07-11 MED ORDER — CYCLOPENTOLATE-PHENYLEPHRINE OP SOLN OPTIME - NO CHARGE
OPHTHALMIC | Status: AC
  Filled 2013-07-11: qty 2

## 2013-07-11 MED ORDER — PHENYLEPHRINE HCL 2.5 % OP SOLN
OPHTHALMIC | Status: AC
  Filled 2013-07-11: qty 15

## 2013-07-11 MED ORDER — LIDOCAINE HCL (PF) 1 % IJ SOLN
INTRAMUSCULAR | Status: AC
  Filled 2013-07-11: qty 2

## 2013-07-11 MED ORDER — LIDOCAINE HCL 3.5 % OP GEL
OPHTHALMIC | Status: AC
  Filled 2013-07-11: qty 1

## 2013-07-12 ENCOUNTER — Encounter (HOSPITAL_COMMUNITY)
Admission: RE | Disposition: A | Discharge status: Home / Self Care | Payer: Self-pay | Source: Ambulatory Visit | Attending: Ophthalmology

## 2013-07-12 ENCOUNTER — Encounter (HOSPITAL_COMMUNITY): Payer: Medicare Other | Admitting: Anesthesiology

## 2013-07-12 ENCOUNTER — Ambulatory Visit (HOSPITAL_COMMUNITY): Payer: Medicare Other | Admitting: Anesthesiology

## 2013-07-12 ENCOUNTER — Encounter (HOSPITAL_COMMUNITY): Payer: Self-pay | Admitting: Ophthalmology

## 2013-07-12 ENCOUNTER — Ambulatory Visit (HOSPITAL_COMMUNITY)
Admission: RE | Admit: 2013-07-12 | Discharge: 2013-07-12 | Disposition: A | Discharge status: Home / Self Care | Payer: Medicare Other | Source: Ambulatory Visit | Attending: Ophthalmology | Admitting: Ophthalmology

## 2013-07-12 DIAGNOSIS — E119 Type 2 diabetes mellitus without complications: Secondary | ICD-10-CM | POA: Insufficient documentation

## 2013-07-12 DIAGNOSIS — I10 Essential (primary) hypertension: Secondary | ICD-10-CM | POA: Insufficient documentation

## 2013-07-12 DIAGNOSIS — Z01812 Encounter for preprocedural laboratory examination: Secondary | ICD-10-CM | POA: Insufficient documentation

## 2013-07-12 DIAGNOSIS — H2589 Other age-related cataract: Secondary | ICD-10-CM | POA: Insufficient documentation

## 2013-07-12 HISTORY — PX: CATARACT EXTRACTION W/PHACO: SHX586

## 2013-07-12 LAB — GLUCOSE, CAPILLARY: Glucose-Capillary: 112 mg/dL — ABNORMAL HIGH (ref 70–99)

## 2013-07-12 SURGERY — PHACOEMULSIFICATION, CATARACT, WITH IOL INSERTION
Anesthesia: Monitor Anesthesia Care | Site: Eye | Laterality: Right | Wound class: Clean

## 2013-07-12 MED ORDER — PHENYLEPHRINE HCL 2.5 % OP SOLN
1.0000 [drp] | OPHTHALMIC | Status: AC
  Administered 2013-07-12 (×3): 1 [drp] via OPHTHALMIC

## 2013-07-12 MED ORDER — EPINEPHRINE HCL 1 MG/ML IJ SOLN
INTRAOCULAR | Status: DC | PRN
  Administered 2013-07-12: 13:00:00

## 2013-07-12 MED ORDER — LIDOCAINE 3.5 % OP GEL OPTIME - NO CHARGE
OPHTHALMIC | Status: DC | PRN
  Administered 2013-07-12: 2 [drp] via OPHTHALMIC

## 2013-07-12 MED ORDER — MIDAZOLAM HCL 2 MG/2ML IJ SOLN
INTRAMUSCULAR | Status: AC
  Filled 2013-07-12: qty 2

## 2013-07-12 MED ORDER — LIDOCAINE HCL 3.5 % OP GEL: 1.0000 "application " | Freq: Once | OPHTHALMIC | Status: DC

## 2013-07-12 MED ORDER — CYCLOPENTOLATE-PHENYLEPHRINE 0.2-1 % OP SOLN
1.0000 [drp] | OPHTHALMIC | Status: AC
  Administered 2013-07-12 (×3): 1 [drp] via OPHTHALMIC

## 2013-07-12 MED ORDER — MIDAZOLAM HCL 2 MG/2ML IJ SOLN
1.0000 mg | INTRAMUSCULAR | Status: DC | PRN
  Administered 2013-07-12: 2 mg via INTRAVENOUS

## 2013-07-12 MED ORDER — TETRACAINE HCL 0.5 % OP SOLN
1.0000 [drp] | OPHTHALMIC | Status: AC
  Administered 2013-07-12 (×3): 1 [drp] via OPHTHALMIC

## 2013-07-12 MED ORDER — FENTANYL CITRATE 0.05 MG/ML IJ SOLN
25.0000 ug | INTRAMUSCULAR | Status: AC
  Administered 2013-07-12 (×2): 25 ug via INTRAVENOUS

## 2013-07-12 MED ORDER — POVIDONE-IODINE 5 % OP SOLN
OPHTHALMIC | Status: DC | PRN
  Administered 2013-07-12: 1 via OPHTHALMIC

## 2013-07-12 MED ORDER — NEOMYCIN-POLYMYXIN-DEXAMETH 3.5-10000-0.1 OP SUSP
OPHTHALMIC | Status: DC | PRN
  Administered 2013-07-12: 2 [drp] via OPHTHALMIC

## 2013-07-12 MED ORDER — PROVISC 10 MG/ML IO SOLN
INTRAOCULAR | Status: DC | PRN
  Administered 2013-07-12: 0.85 mL via INTRAOCULAR

## 2013-07-12 MED ORDER — LACTATED RINGERS IV SOLN
INTRAVENOUS | Status: DC
  Administered 2013-07-12: 1000 mL via INTRAVENOUS

## 2013-07-12 MED ORDER — LIDOCAINE HCL (PF) 1 % IJ SOLN
INTRAMUSCULAR | Status: DC | PRN
  Administered 2013-07-12: .4 mL

## 2013-07-12 MED ORDER — BSS IO SOLN
INTRAOCULAR | Status: DC | PRN
  Administered 2013-07-12: 15 mL via INTRAOCULAR

## 2013-07-12 MED ORDER — FENTANYL CITRATE 0.05 MG/ML IJ SOLN
INTRAMUSCULAR | Status: AC
  Filled 2013-07-12: qty 2

## 2013-07-12 SURGICAL SUPPLY — 32 items
CAPSULAR TENSION RING-AMO (OPHTHALMIC RELATED) IMPLANT
CLOTH BEACON ORANGE TIMEOUT ST (SAFETY) ×1 IMPLANT
EYE SHIELD UNIVERSAL CLEAR (GAUZE/BANDAGES/DRESSINGS) ×1 IMPLANT
GLOVE BIO SURGEON STRL SZ 6.5 (GLOVE) IMPLANT
GLOVE BIOGEL PI IND STRL 6.5 (GLOVE) ×1 IMPLANT
GLOVE BIOGEL PI IND STRL 7.0 (GLOVE) IMPLANT
GLOVE BIOGEL PI IND STRL 7.5 (GLOVE) IMPLANT
GLOVE BIOGEL PI INDICATOR 6.5 (GLOVE) ×1
GLOVE BIOGEL PI INDICATOR 7.0 (GLOVE)
GLOVE BIOGEL PI INDICATOR 7.5 (GLOVE)
GLOVE ECLIPSE 6.5 STRL STRAW (GLOVE) IMPLANT
GLOVE ECLIPSE 7.0 STRL STRAW (GLOVE) IMPLANT
GLOVE ECLIPSE 7.5 STRL STRAW (GLOVE) IMPLANT
GLOVE EXAM NITRILE LRG STRL (GLOVE) ×1 IMPLANT
GLOVE EXAM NITRILE MD LF STRL (GLOVE) IMPLANT
GLOVE SKINSENSE NS SZ6.5 (GLOVE)
GLOVE SKINSENSE NS SZ7.0 (GLOVE)
GLOVE SKINSENSE STRL SZ6.5 (GLOVE) IMPLANT
GLOVE SKINSENSE STRL SZ7.0 (GLOVE) IMPLANT
KIT VITRECTOMY (OPHTHALMIC RELATED) IMPLANT
PAD ARMBOARD 7.5X6 YLW CONV (MISCELLANEOUS) ×1 IMPLANT
PROC W NO LENS (INTRAOCULAR LENS)
PROC W SPEC LENS (INTRAOCULAR LENS)
PROCESS W NO LENS (INTRAOCULAR LENS) IMPLANT
PROCESS W SPEC LENS (INTRAOCULAR LENS) IMPLANT
RING MALYGIN (MISCELLANEOUS) IMPLANT
SIGHTPATH CAT PROC W REG LENS (Ophthalmic Related) ×2 IMPLANT
SYR TB 1ML LL NO SAFETY (SYRINGE) ×1 IMPLANT
TAPE SURG TRANSPORE 1 IN (GAUZE/BANDAGES/DRESSINGS) IMPLANT
TAPE SURGICAL TRANSPORE 1 IN (GAUZE/BANDAGES/DRESSINGS) ×1
VISCOELASTIC ADDITIONAL (OPHTHALMIC RELATED) IMPLANT
WATER STERILE IRR 250ML POUR (IV SOLUTION) ×1 IMPLANT

## 2013-07-12 NOTE — Op Note (Signed)
Date of Admission: 07/12/2013  Date of Surgery: 07/12/2013  Pre-Op Dx: Cataract  Right  Eye  Post-Op Dx: Cataract  Right  Eye,  Dx Code 366.19  Surgeon: [name], M.D.  Assistants: None  Anesthesia: Topical with MAC  Indications: Painless, progressive loss of vision with compromise of daily activities.  Surgery: Cataract Extraction with Intraocular lens Implant Right Eye  Discription: The patient had dilating drops and viscous lidocaine placed into the right eye in the pre-op holding area. After transfer to the operating room, a time out was performed. The patient was then prepped and draped. Beginning with a 49 degree blade a paracentesis port was made at the surgeon's 2 o'clock position. The anterior chamber was then filled with 1% non-preserved lidocaine. This was followed by filling the anterior chamber with Provisc.  A 2.17mm keratome blade was used to make a clear corneal incision at the temporal limbus.  A bent cystatome needle was used to create a continuous tear capsulotomy. Hydrodissection was performed with balanced salt solution on a Fine canula. The lens nucleus was then removed using the phacoemulsification handpiece. Residual cortex was removed with the I&A handpiece. The anterior chamber and capsular bag were refilled with Provisc. A posterior chamber intraocular lens was placed into the capsular bag with it's injector. The implant was positioned with the Kuglan hook. The Provisc was then removed from the anterior chamber and capsular bag with the I&A handpiece. Stromal hydration of the main incision and paracentesis port was performed with BSS on a Fine canula. The wounds were tested for leak which was negative. The patient tolerated the procedure well. There were no operative complications. The patient was then transferred to the recovery room in stable condition.  Complications: None  Specimen: None  EBL: None  Prosthetic device: B&L enVista, MX60, power 16.0D, SN  [id number].

## 2013-07-12 NOTE — Anesthesia Preprocedure Evaluation (Signed)
Anesthesia Evaluation  Patient identified by MRN, date of birth, ID band Patient awake    Reviewed: Allergy & Precautions, H&P , NPO status , Patient's Chart, lab work & pertinent test results  Airway Mallampati: II TM Distance: >3 FB     Dental  (+) Teeth Intact   Pulmonary shortness of breath,  breath sounds clear to auscultation        Cardiovascular hypertension, Pt. on medications Rhythm:Regular Rate:Normal     Neuro/Psych PSYCHIATRIC DISORDERS Anxiety Depression    GI/Hepatic GERD-  Medicated,  Endo/Other  diabetes, Type 2, Oral Hypoglycemic Agents  Renal/GU Renal disease     Musculoskeletal   Abdominal   Peds  Hematology   Anesthesia Other Findings   Reproductive/Obstetrics                           Anesthesia Physical Anesthesia Plan  ASA: III  Anesthesia Plan: MAC   Post-op Pain Management:    Induction: Intravenous  Airway Management Planned: Nasal Cannula  Additional Equipment:   Intra-op Plan:   Post-operative Plan:   Informed Consent: I have reviewed the patients History and Physical, chart, labs and discussed the procedure including the risks, benefits and alternatives for the proposed anesthesia with the patient or authorized representative who has indicated his/her understanding and acceptance.     Plan Discussed with:   Anesthesia Plan Comments:         Anesthesia Quick Evaluation

## 2013-07-12 NOTE — Transfer of Care (Signed)
Immediate Anesthesia Transfer of Care Note  Patient: Tanya Lucero  Procedure(s) Performed: Procedure(s) with comments: CATARACT EXTRACTION PHACO AND INTRAOCULAR LENS PLACEMENT (IOC) (Right) - CDE:12.48  Patient Location: Short Stay  Anesthesia Type:MAC  Level of Consciousness: awake  Airway & Oxygen Therapy: Patient Spontanous Breathing  Post-op Assessment: Report given to PACU RN  Post vital signs: Reviewed  Complications: No apparent anesthesia complications

## 2013-07-12 NOTE — Transfer of Care (Signed)
Immediate Anesthesia Transfer of Care Note  Patient: Tanya Lucero  Procedure(s) Performed: Procedure(s) with comments: CATARACT EXTRACTION PHACO AND INTRAOCULAR LENS PLACEMENT (IOC) (Right) - CDE:12.48  Patient Location: Short Stay  Anesthesia Type:MAC  Level of Consciousness: awake, alert  and oriented  Airway & Oxygen Therapy: Patient Spontanous Breathing  Post-op Assessment: Report given to PACU RN  Post vital signs: Reviewed and stable  Complications: No apparent anesthesia complications

## 2013-07-12 NOTE — Brief Op Note (Signed)
TOOK ONE HALF OF INSULIN DOSE YESTERDAY AT 2200  22 UNITS

## 2013-07-12 NOTE — H&P (Signed)
I have reviewed the H&P, the patient was re-examined, and I have identified no interval changes in medical condition and plan of care since the history and physical of record  

## 2013-07-12 NOTE — Anesthesia Postprocedure Evaluation (Signed)
  Anesthesia Post-op Note  Patient: Tanya Lucero  Procedure(s) Performed: Procedure(s) with comments: CATARACT EXTRACTION PHACO AND INTRAOCULAR LENS PLACEMENT (IOC) (Right) - CDE:12.48  Patient Location: Short Stay  Anesthesia Type:MAC  Level of Consciousness: awake, alert  and oriented  Airway and Oxygen Therapy: Patient Spontanous Breathing  Post-op Pain: none  Post-op Assessment: Post-op Vital signs reviewed, Patient's Cardiovascular Status Stable, Respiratory Function Stable, Patent Airway and No signs of Nausea or vomiting  Post-op Vital Signs: Reviewed and stable  Complications: No apparent anesthesia complications

## 2013-07-16 ENCOUNTER — Encounter (HOSPITAL_COMMUNITY): Payer: Self-pay | Admitting: Ophthalmology

## 2014-08-20 ENCOUNTER — Other Ambulatory Visit: Payer: Self-pay | Admitting: *Deleted

## 2014-08-20 DIAGNOSIS — Z01810 Encounter for preprocedural cardiovascular examination: Secondary | ICD-10-CM

## 2014-08-20 DIAGNOSIS — N184 Chronic kidney disease, stage 4 (severe): Secondary | ICD-10-CM

## 2014-08-26 ENCOUNTER — Encounter: Payer: Self-pay | Admitting: Vascular Surgery

## 2014-08-27 ENCOUNTER — Ambulatory Visit (HOSPITAL_COMMUNITY)
Admission: RE | Admit: 2014-08-27 | Discharge: 2014-08-27 | Disposition: A | Discharge status: Home / Self Care | Payer: Medicare Other | Source: Ambulatory Visit | Attending: Vascular Surgery | Admitting: Vascular Surgery

## 2014-08-27 ENCOUNTER — Other Ambulatory Visit: Payer: Self-pay

## 2014-08-27 ENCOUNTER — Encounter: Payer: Self-pay | Admitting: Vascular Surgery

## 2014-08-27 ENCOUNTER — Other Ambulatory Visit: Payer: Self-pay | Admitting: Vascular Surgery

## 2014-08-27 ENCOUNTER — Ambulatory Visit (INDEPENDENT_AMBULATORY_CARE_PROVIDER_SITE_OTHER): Payer: Medicare Other | Admitting: Vascular Surgery

## 2014-08-27 VITALS — BP 158/79 | HR 90 | Resp 14 | Ht 64.0 in | Wt 201.0 lb

## 2014-08-27 DIAGNOSIS — Z01810 Encounter for preprocedural cardiovascular examination: Secondary | ICD-10-CM

## 2014-08-27 DIAGNOSIS — N184 Chronic kidney disease, stage 4 (severe): Secondary | ICD-10-CM

## 2014-08-27 NOTE — Progress Notes (Signed)
Patient name: Tanya Lucero MRN: [id number] DOB: 1951/08/11 Sex: female   Referred by: Lowanda Foster  Reason for referral:  Chief Complaint  Patient presents with  . New Evaluation    eval for access placement    HISTORY OF PRESENT ILLNESS: Patient resents today for evaluation of AV access placement. She is a chronic kidney disease stage IV and is seen today for discussion of AV access. She has not been on dialysis. She is right-handed.  Past Medical History  Diagnosis Date  . Diabetes mellitus   . Hypertension   . GERD (gastroesophageal reflux disease)   . Skin cancer   . Anxiety   . Depression   . Chronic kidney disease     neuphrotic syndrome-05/2013  . Arthritis   . Gout   . Anemia     Past Surgical History  Procedure Laterality Date  . Back surgery    . Foot surgery Right     x2-heel spur   . Skin cancer excision      forehead  . Abdominal hysterectomy    . Tonsillectomy    . Adenoidectomy    . Cataract extraction w/phaco Left 07/02/2013    Procedure: CATARACT EXTRACTION PHACO AND INTRAOCULAR LENS PLACEMENT (IOC);  Surgeon: [name], MD;  Location: [facility];  Service: Ophthalmology;  Laterality: Left;  CDE:  6.46  . Cataract extraction w/phaco Right 07/12/2013    Procedure: CATARACT EXTRACTION PHACO AND INTRAOCULAR LENS PLACEMENT (IOC);  Surgeon: [name], MD;  Location: [facility];  Service: Ophthalmology;  Laterality: Right;  CDE:12.48    History   Social History  . Marital Status: Divorced    Spouse Name: N/A    Number of Children: N/A  . Years of Education: N/A   Occupational History  . Not on file.   Social History Main Topics  . Smoking status: Former Smoker -- 0.50 packs/day for 40 years    Types: Cigarettes    Quit date: 05/29/2013  . Smokeless tobacco: Never Used  . Alcohol Use: No  . Drug Use: No  . Sexual Activity: Yes    Birth Control/ Protection: Surgical   Other Topics Concern  . Not on file   Social History Narrative   No  children    Family History  Problem Relation Age of Onset  . Multiple myeloma Mother   . Diabetes Mother   . Heart attack Father 57    Allergies as of 08/27/2014 - Review Complete 08/27/2014  Allergen Reaction Noted  . Penicillins      Current Outpatient Prescriptions on File Prior to Visit  Medication Sig Dispense Refill  . ALPRAZolam (XANAX) 0.5 MG tablet Take 0.5 mg by mouth 2 (two) times daily.      Marland Kitchen amLODipine (NORVASC) 10 MG tablet Take 10 mg by mouth daily.      . Calcium Carbonate-Vitamin D (CALTRATE 600+D) 600-400 MG-UNIT per tablet Take 1 tablet by mouth 2 (two) times daily.      . DULoxetine (CYMBALTA) 60 MG capsule Take 60 mg by mouth daily.      Marland Kitchen glipiZIDE (GLUCOTROL) 5 MG tablet Take 5 mg by mouth daily.    Marland Kitchen oxyCODONE-acetaminophen (PERCOCET/ROXICET) 5-325 MG per tablet Take 1 tablet by mouth every 6 (six) hours as needed for pain.    . pantoprazole (PROTONIX) 40 MG tablet Take 40 mg by mouth daily.    Marland Kitchen aspirin EC 81 MG tablet Take 81 mg by mouth daily.    Marland Kitchen  fish oil-omega-3 fatty acids 1000 MG capsule Take 1 capsule by mouth 2 (two) times daily.      . insulin aspart (NOVOLOG) 100 UNIT/ML injection Inject 15-35 Units into the skin 4 (four) times daily. 15 units with each meal and 35 units at bedtime    . naproxen (NAPROSYN) 500 MG tablet Take 500 mg by mouth 2 (two) times daily with a meal.       No current facility-administered medications on file prior to visit.     REVIEW OF SYSTEMS:  Positives indicated with an "X"  CARDIOVASCULAR:  _0  chest pain   _1  chest pressure   _2  palpitations   _3  orthopnea   [x ] dyspnea on exertion   _4  claudication   _5  rest pain   _6  DVT   _7  phlebitis PULMONARY:   [x ] productive cough   _8  asthma   x_9  wheezing NEUROLOGIC:   [x ] weakness  x_10  paresthesias  _11  aphasia  _12  amaurosis  [x ] dizziness HEMATOLOGIC:   _13  bleeding problems   _14  clotting disorders MUSCULOSKELETAL:  _15  joint pain   _16  joint  swelling GASTROINTESTINAL: _17   blood in stool  _18   hematemesis GENITOURINARY:  _19   dysuria  _20   hematuria PSYCHIATRIC:  _21  history of major depression INTEGUMENTARY:  _22  rashes  _23  ulcers CONSTITUTIONAL:  _24  fever   _25  chills  PHYSICAL EXAMINATION:  General: The patient is a well-nourished female, in no acute distress. Vital signs are BP 158/79 mmHg  Pulse 90  Resp 14  Ht _26  (1.626 m)  Wt 201 lb (91.173 kg)  BMI 34.48 kg/m2 Pulmonary: There is a good air exchange   Musculoskeletal: There are no major deformities.  There is no significant extremity pain. Neurologic: No focal weakness or paresthesias are detected, Skin: There are no ulcer or rashes noted. Psychiatric: The patient has normal affect. Cardiovascular: 2+ radial pulses bilaterally   Surface veins are moderate size in the right and smaller on the left   [facility] Vascular Lab Studies:  Ordered and Independently Reviewed normal triphasic arterial signals at the level of the wrist. Does have a moderate-sized cephalic vein bilaterally. I imaged this myself with SonoSite ultrasound this does appear to be better throughout her right arm and is more superficial. The rain vein run significantly deeper on the left   Impression and Plan:  I discussed options with patient. Explained the possible options of catheter placement for immediate hemodialysis, AV fistula and AV graft placement. Explained the purpose of a to have fistula working prior to initiation for dialysis. Her right vein does appear to be her best option for a radiocephalic fistula. We will coordinate this at her earliest convenience as an outpatient    [facility] [place] Office: [phone]

## 2014-09-12 ENCOUNTER — Encounter (HOSPITAL_COMMUNITY): Payer: Self-pay | Admitting: *Deleted

## 2014-09-12 MED ORDER — VANCOMYCIN HCL IN DEXTROSE 1-5 GM/200ML-% IV SOLN
1000.0000 mg | INTRAVENOUS | Status: AC
  Administered 2014-09-13: 1000 mg via INTRAVENOUS
  Filled 2014-09-12 (×3): qty 200

## 2014-09-12 MED ORDER — SODIUM CHLORIDE 0.9 % IV SOLN
INTRAVENOUS | Status: DC
Start: 2014-09-12 — End: 2014-09-13
  Administered 2014-09-13: 07:00:00 via INTRAVENOUS

## 2014-09-12 MED ORDER — CHLORHEXIDINE GLUCONATE CLOTH 2 % EX PADS: 6.0000 | MEDICATED_PAD | Freq: Once | CUTANEOUS | Status: DC

## 2014-09-13 ENCOUNTER — Ambulatory Visit (HOSPITAL_COMMUNITY): Payer: Medicare Other | Admitting: Anesthesiology

## 2014-09-13 ENCOUNTER — Telehealth: Payer: Self-pay | Admitting: Vascular Surgery

## 2014-09-13 ENCOUNTER — Ambulatory Visit (HOSPITAL_COMMUNITY)
Admission: RE | Admit: 2014-09-13 | Discharge: 2014-09-13 | Disposition: A | Discharge status: Home / Self Care | Payer: Medicare Other | Source: Ambulatory Visit | Attending: Vascular Surgery | Admitting: Vascular Surgery

## 2014-09-13 ENCOUNTER — Other Ambulatory Visit: Payer: Self-pay | Admitting: *Deleted

## 2014-09-13 ENCOUNTER — Encounter (HOSPITAL_COMMUNITY)
Admission: RE | Disposition: A | Discharge status: Home / Self Care | Payer: Self-pay | Source: Ambulatory Visit | Attending: Vascular Surgery

## 2014-09-13 ENCOUNTER — Encounter (HOSPITAL_COMMUNITY): Payer: Self-pay | Admitting: Anesthesiology

## 2014-09-13 DIAGNOSIS — F419 Anxiety disorder, unspecified: Secondary | ICD-10-CM | POA: Insufficient documentation

## 2014-09-13 DIAGNOSIS — N186 End stage renal disease: Secondary | ICD-10-CM

## 2014-09-13 DIAGNOSIS — Z880 Allergy status to penicillin: Secondary | ICD-10-CM | POA: Insufficient documentation

## 2014-09-13 DIAGNOSIS — F329 Major depressive disorder, single episode, unspecified: Secondary | ICD-10-CM | POA: Diagnosis not present

## 2014-09-13 DIAGNOSIS — Z794 Long term (current) use of insulin: Secondary | ICD-10-CM | POA: Diagnosis not present

## 2014-09-13 DIAGNOSIS — Z85828 Personal history of other malignant neoplasm of skin: Secondary | ICD-10-CM | POA: Diagnosis not present

## 2014-09-13 DIAGNOSIS — N184 Chronic kidney disease, stage 4 (severe): Secondary | ICD-10-CM | POA: Diagnosis present

## 2014-09-13 DIAGNOSIS — Z87891 Personal history of nicotine dependence: Secondary | ICD-10-CM | POA: Insufficient documentation

## 2014-09-13 DIAGNOSIS — Z807 Family history of other malignant neoplasms of lymphoid, hematopoietic and related tissues: Secondary | ICD-10-CM | POA: Diagnosis not present

## 2014-09-13 DIAGNOSIS — Z4931 Encounter for adequacy testing for hemodialysis: Secondary | ICD-10-CM

## 2014-09-13 DIAGNOSIS — I129 Hypertensive chronic kidney disease with stage 1 through stage 4 chronic kidney disease, or unspecified chronic kidney disease: Secondary | ICD-10-CM | POA: Diagnosis not present

## 2014-09-13 DIAGNOSIS — K219 Gastro-esophageal reflux disease without esophagitis: Secondary | ICD-10-CM | POA: Insufficient documentation

## 2014-09-13 DIAGNOSIS — E119 Type 2 diabetes mellitus without complications: Secondary | ICD-10-CM | POA: Insufficient documentation

## 2014-09-13 DIAGNOSIS — M109 Gout, unspecified: Secondary | ICD-10-CM | POA: Diagnosis not present

## 2014-09-13 DIAGNOSIS — Z833 Family history of diabetes mellitus: Secondary | ICD-10-CM | POA: Insufficient documentation

## 2014-09-13 DIAGNOSIS — Z8249 Family history of ischemic heart disease and other diseases of the circulatory system: Secondary | ICD-10-CM | POA: Diagnosis not present

## 2014-09-13 HISTORY — DX: Xerosis cutis: L85.3

## 2014-09-13 HISTORY — DX: Headache, unspecified: R51.9

## 2014-09-13 HISTORY — DX: Headache: R51

## 2014-09-13 HISTORY — DX: Reserved for inherently not codable concepts without codable children: IMO0001

## 2014-09-13 HISTORY — DX: Constipation, unspecified: K59.00

## 2014-09-13 HISTORY — DX: Type 2 diabetes mellitus with diabetic neuropathy, unspecified: E11.40

## 2014-09-13 HISTORY — PX: AV FISTULA PLACEMENT: SHX1204

## 2014-09-13 LAB — GLUCOSE, CAPILLARY
Glucose-Capillary: 238 mg/dL — ABNORMAL HIGH (ref 70–99)
Glucose-Capillary: 251 mg/dL — ABNORMAL HIGH (ref 70–99)

## 2014-09-13 LAB — POCT I-STAT 4, (NA,K, GLUC, HGB,HCT)
Glucose, Bld: 237 mg/dL — ABNORMAL HIGH (ref 70–99)
HEMATOCRIT: 27 % — AB (ref 36.0–46.0)
Hemoglobin: 9.2 g/dL — ABNORMAL LOW (ref 12.0–15.0)
POTASSIUM: 3.6 mmol/L (ref 3.5–5.1)
Sodium: 141 mmol/L (ref 135–145)

## 2014-09-13 SURGERY — ARTERIOVENOUS (AV) FISTULA CREATION
Anesthesia: Monitor Anesthesia Care | Site: Arm Lower | Laterality: Right

## 2014-09-13 MED ORDER — FENTANYL CITRATE 0.05 MG/ML IJ SOLN
INTRAMUSCULAR | Status: DC | PRN
  Administered 2014-09-13: 50 ug via INTRAVENOUS
  Administered 2014-09-13: 25 ug via INTRAVENOUS

## 2014-09-13 MED ORDER — OXYCODONE HCL 5 MG PO TABS
ORAL_TABLET | ORAL | Status: AC
  Filled 2014-09-13: qty 1

## 2014-09-13 MED ORDER — FENTANYL CITRATE 0.05 MG/ML IJ SOLN
INTRAMUSCULAR | Status: AC
  Filled 2014-09-13: qty 5

## 2014-09-13 MED ORDER — INSULIN ASPART 100 UNIT/ML ~~LOC~~ SOLN
4.0000 [IU] | Freq: Once | SUBCUTANEOUS | Status: AC
  Administered 2014-09-13: 4 [IU] via SUBCUTANEOUS

## 2014-09-13 MED ORDER — 0.9 % SODIUM CHLORIDE (POUR BTL) OPTIME
TOPICAL | Status: DC | PRN
  Administered 2014-09-13: 1000 mL

## 2014-09-13 MED ORDER — INSULIN ASPART 100 UNIT/ML ~~LOC~~ SOLN
SUBCUTANEOUS | Status: AC
  Filled 2014-09-13: qty 4

## 2014-09-13 MED ORDER — FENTANYL CITRATE 0.05 MG/ML IJ SOLN: 25.0000 ug | INTRAMUSCULAR | Status: DC | PRN

## 2014-09-13 MED ORDER — LIDOCAINE HCL (CARDIAC) 20 MG/ML IV SOLN
INTRAVENOUS | Status: AC
  Filled 2014-09-13: qty 5

## 2014-09-13 MED ORDER — PROPOFOL INFUSION 10 MG/ML OPTIME
INTRAVENOUS | Status: DC | PRN
  Administered 2014-09-13: 100 ug/kg/min via INTRAVENOUS

## 2014-09-13 MED ORDER — HEPARIN SODIUM (PORCINE) 1000 UNIT/ML IJ SOLN
INTRAMUSCULAR | Status: AC
  Filled 2014-09-13: qty 1

## 2014-09-13 MED ORDER — ROCURONIUM BROMIDE 50 MG/5ML IV SOLN
INTRAVENOUS | Status: AC
  Filled 2014-09-13: qty 1

## 2014-09-13 MED ORDER — SODIUM CHLORIDE 0.9 % IR SOLN
Status: DC | PRN
  Administered 2014-09-13: 500 mL

## 2014-09-13 MED ORDER — PROMETHAZINE HCL 25 MG/ML IJ SOLN: 6.2500 mg | INTRAMUSCULAR | Status: DC | PRN

## 2014-09-13 MED ORDER — SODIUM CHLORIDE 0.9 % IV SOLN
INTRAVENOUS | Status: DC | PRN
  Administered 2014-09-13: 07:00:00 via INTRAVENOUS

## 2014-09-13 MED ORDER — MIDAZOLAM HCL 2 MG/2ML IJ SOLN
INTRAMUSCULAR | Status: AC
  Filled 2014-09-13: qty 2

## 2014-09-13 MED ORDER — OXYCODONE HCL 5 MG PO TABS
5.0000 mg | ORAL_TABLET | Freq: Once | ORAL | Status: AC
  Administered 2014-09-13: 5 mg via ORAL

## 2014-09-13 MED ORDER — LIDOCAINE-EPINEPHRINE 0.5 %-1:200000 IJ SOLN
INTRAMUSCULAR | Status: AC
  Filled 2014-09-13: qty 1

## 2014-09-13 MED ORDER — LIDOCAINE-EPINEPHRINE 0.5 %-1:200000 IJ SOLN
INTRAMUSCULAR | Status: DC | PRN
  Administered 2014-09-13: 5 mL

## 2014-09-13 MED ORDER — OXYCODONE-ACETAMINOPHEN 10-325 MG PO TABS: 1.0000 | ORAL_TABLET | Freq: Every day | ORAL | Status: DC

## 2014-09-13 MED ORDER — MIDAZOLAM HCL 5 MG/5ML IJ SOLN
INTRAMUSCULAR | Status: DC | PRN
  Administered 2014-09-13: 2 mg via INTRAVENOUS

## 2014-09-13 MED ORDER — ONDANSETRON HCL 4 MG/2ML IJ SOLN
INTRAMUSCULAR | Status: AC
  Filled 2014-09-13: qty 2

## 2014-09-13 SURGICAL SUPPLY — 40 items
APL SKNCLS STERI-STRIP NONHPOA (GAUZE/BANDAGES/DRESSINGS) ×1
ARMBAND PINK RESTRICT EXTREMIT (MISCELLANEOUS) ×3 IMPLANT
BENZOIN TINCTURE PRP APPL 2/3 (GAUZE/BANDAGES/DRESSINGS) ×3 IMPLANT
BLADE SURG 10 STRL SS (BLADE) ×3 IMPLANT
CANISTER SUCTION 2500CC (MISCELLANEOUS) ×3 IMPLANT
CANNULA VESSEL 3MM 2 BLNT TIP (CANNULA) ×2 IMPLANT
CLIP LIGATING EXTRA MED SLVR (CLIP) ×3 IMPLANT
CLIP LIGATING EXTRA SM BLUE (MISCELLANEOUS) ×3 IMPLANT
CLOSURE WOUND 1/2 X4 (GAUZE/BANDAGES/DRESSINGS) ×1
COVER PROBE W GEL 5X96 (DRAPES) ×3 IMPLANT
COVER SURGICAL LIGHT HANDLE (MISCELLANEOUS) ×3 IMPLANT
DECANTER SPIKE VIAL GLASS SM (MISCELLANEOUS) ×3 IMPLANT
ELECT REM PT RETURN 9FT ADLT (ELECTROSURGICAL) ×3
ELECTRODE REM PT RTRN 9FT ADLT (ELECTROSURGICAL) ×1 IMPLANT
GAUZE SPONGE 4X4 12PLY STRL (GAUZE/BANDAGES/DRESSINGS) ×3 IMPLANT
GEL ULTRASOUND 20GR AQUASONIC (MISCELLANEOUS) IMPLANT
GLOVE BIO SURGEON STRL SZ 6.5 (GLOVE) ×1 IMPLANT
GLOVE BIO SURGEONS STRL SZ 6.5 (GLOVE) ×1
GLOVE BIOGEL PI IND STRL 6.5 (GLOVE) IMPLANT
GLOVE BIOGEL PI INDICATOR 6.5 (GLOVE) ×2
GLOVE SKINSENSE NS SZ7.0 (GLOVE) ×2
GLOVE SKINSENSE STRL SZ7.0 (GLOVE) IMPLANT
GLOVE SS BIOGEL STRL SZ 7.5 (GLOVE) ×1 IMPLANT
GLOVE SUPERSENSE BIOGEL SZ 7.5 (GLOVE) ×2
GOWN STRL REUS W/ TWL LRG LVL3 (GOWN DISPOSABLE) ×3 IMPLANT
GOWN STRL REUS W/TWL LRG LVL3 (GOWN DISPOSABLE) ×9
KIT BASIN OR (CUSTOM PROCEDURE TRAY) ×3 IMPLANT
KIT ROOM TURNOVER OR (KITS) ×3 IMPLANT
NS IRRIG 1000ML POUR BTL (IV SOLUTION) ×3 IMPLANT
PACK CV ACCESS (CUSTOM PROCEDURE TRAY) ×3 IMPLANT
PAD ARMBOARD 7.5X6 YLW CONV (MISCELLANEOUS) ×6 IMPLANT
PROBE PENCIL 8 MHZ STRL DISP (MISCELLANEOUS) IMPLANT
SPONGE GAUZE 4X4 12PLY STER LF (GAUZE/BANDAGES/DRESSINGS) ×3 IMPLANT
STRIP CLOSURE SKIN 1/2X4 (GAUZE/BANDAGES/DRESSINGS) ×2 IMPLANT
SUT PROLENE 6 0 CC (SUTURE) ×3 IMPLANT
SUT VIC AB 3-0 SH 27 (SUTURE) ×3
SUT VIC AB 3-0 SH 27X BRD (SUTURE) ×1 IMPLANT
TAPE CLOTH SURG 4X10 WHT LF (GAUZE/BANDAGES/DRESSINGS) ×2 IMPLANT
UNDERPAD 30X30 INCONTINENT (UNDERPADS AND DIAPERS) ×3 IMPLANT
WATER STERILE IRR 1000ML POUR (IV SOLUTION) ×3 IMPLANT

## 2014-09-13 NOTE — Op Note (Signed)
    OPERATIVE REPORT  DATE OF SURGERY: 09/13/2014  PATIENT: Tanya Lucero, 64 y.o. female MRN: [id number]  DOB: 08-03-1951  PRE-OPERATIVE DIAGNOSIS: Chronic renal insufficiency  POST-OPERATIVE DIAGNOSIS:  Same  PROCEDURE: Right wrist Cimino radiocephalic AV fistula  SURGEON:  Curt Jews, M.D.  PHYSICIAN ASSISTANT: Trinh  ANESTHESIA:  Local with sedation  EBL: Minimal ml  Total I/O In: 400 [I.V.:400] Out: -   BLOOD ADMINISTERED: None  DRAINS: None  SPECIMEN: None  COUNTS CORRECT:  YES  PLAN OF CARE: PACU   PATIENT DISPOSITION:  PACU - hemodynamically stable  PROCEDURE DETAILS: Patient was taken to the operating placed supine position where the area of the right arm was prepped and draped in usual sterile fashion. SonoSite ultrasound was used to revisualize cephalic vein which was of good caliber and patent throughout the forearm. Using local anesthesia incision made between the level of the cephalic vein and the radial artery at the wrist. The cephalic vein was mobilized. It was relatively thin-walled but of good caliber. Tributary branches were ligated with 30 divided. The vein was mobilized and was ligated distally and was transected and mobilized to the level of the radial artery. The radial artery was exposed the same incision. The artery was of good caliber with no atherosclerotic change. Tributary branches of the radial artery were ligated with 4-0 silk ties and divided. The artery was occluded proximally and distally and was opened with an 11 blade and extended longitudinally with Potts scissors. The vein was cut to appropriate length and was spatulated and sewn end-to-side to the artery with a running 6-0 Prolene suture. Clamps were removed and good thrill was noted. The wounds were irrigated with saline. Hemostasis tablet cautery. Wounds were closed with 3-0 Vicryl the subcutaneous and subcuticular tissue. Benzoin and Steri-Strips were applied   Curt Jews,  M.D. 09/13/2014 8:43 AM

## 2014-09-13 NOTE — Anesthesia Postprocedure Evaluation (Signed)
  Anesthesia Post-op Note  Patient: Tanya Lucero  Procedure(s) Performed: Procedure(s): Creation of Right Arm RADIOCEPHALIC ARTERIOVENOUS (AV) FISTULA  (Right)  Patient Location: PACU  Anesthesia Type:MAC  Level of Consciousness: awake and alert   Airway and Oxygen Therapy: Patient Spontanous Breathing  Post-op Pain: none  Post-op Assessment: Post-op Vital signs reviewed  Post-op Vital Signs: stable  Last Vitals:  Filed Vitals:   09/13/14 0930  BP: 169/80  Pulse: 88  Temp:   Resp: 13    Complications: No apparent anesthesia complications

## 2014-09-13 NOTE — Transfer of Care (Signed)
Immediate Anesthesia Transfer of Care Note  Patient: Tanya Lucero  Procedure(s) Performed: Procedure(s): Creation of Right Arm RADIOCEPHALIC ARTERIOVENOUS (AV) FISTULA  (Right)  Patient Location: PACU  Anesthesia Type:MAC  Level of Consciousness: awake, alert  and oriented  Airway & Oxygen Therapy: Patient Spontanous Breathing and Patient connected to nasal cannula oxygen  Post-op Assessment: Report given to PACU RN, Post -op Vital signs reviewed and stable and Patient moving all extremities X 4  Post vital signs: Reviewed and stable  Complications: No apparent anesthesia complications

## 2014-09-13 NOTE — Anesthesia Procedure Notes (Addendum)
Procedure Name: MAC Date/Time: 09/13/2014 7:28 AM Performed by: Neldon Newport Pre-anesthesia Checklist: Patient being monitored, Suction available, Emergency Drugs available, Patient identified and Timeout performed Patient Re-evaluated:Patient Re-evaluated prior to inductionOxygen Delivery Method: Simple face mask Placement Confirmation: positive ETCO2

## 2014-09-13 NOTE — H&P (View-Only) (Signed)
   Patient name: Tanya Lucero MRN: [id number] DOB: 06/18/1951 Sex: female   Referred by: [place]  Reason for referral:  Chief Complaint  Patient presents with  . New Evaluation    eval for access placement    HISTORY OF PRESENT ILLNESS: Patient resents today for evaluation of AV access placement. She is a chronic kidney disease stage IV and is seen today for discussion of AV access. She has not been on dialysis. She is right-handed.  Past Medical History  Diagnosis Date  . Diabetes mellitus   . Hypertension   . GERD (gastroesophageal reflux disease)   . Skin cancer   . Anxiety   . Depression   . Chronic kidney disease     neuphrotic syndrome-05/2013  . Arthritis   . Gout   . Anemia     Past Surgical History  Procedure Laterality Date  . Back surgery    . Foot surgery Right     x2-heel spur   . Skin cancer excision      forehead  . Abdominal hysterectomy    . Tonsillectomy    . Adenoidectomy    . Cataract extraction w/phaco Left 07/02/2013    Procedure: CATARACT EXTRACTION PHACO AND INTRAOCULAR LENS PLACEMENT (IOC);  Surgeon: Kerry Hunt, MD;  Location: [facility];  Service: Ophthalmology;  Laterality: Left;  CDE:  6.46  . Cataract extraction w/phaco Right 07/12/2013    Procedure: CATARACT EXTRACTION PHACO AND INTRAOCULAR LENS PLACEMENT (IOC);  Surgeon: Kerry Hunt, MD;  Location: [facility];  Service: Ophthalmology;  Laterality: Right;  CDE:12.48    History   Social History  . Marital Status: Divorced    Spouse Name: N/A    Number of Children: N/A  . Years of Education: N/A   Occupational History  . Not on file.   Social History Main Topics  . Smoking status: Former Smoker -- 0.50 packs/day for 40 years    Types: Cigarettes    Quit date: 05/29/2013  . Smokeless tobacco: Never Used  . Alcohol Use: No  . Drug Use: No  . Sexual Activity: Yes    Birth Control/ Protection: Surgical   Other Topics Concern  . Not on file   Social History Narrative   No  children    Family History  Problem Relation Age of Onset  . Multiple myeloma Mother   . Diabetes Mother   . Heart attack Father 83    Allergies as of 08/27/2014 - Review Complete 08/27/2014  Allergen Reaction Noted  . Penicillins      Current Outpatient Prescriptions on File Prior to Visit  Medication Sig Dispense Refill  . ALPRAZolam (XANAX) 0.5 MG tablet Take 0.5 mg by mouth 2 (two) times daily.      . amLODipine (NORVASC) 10 MG tablet Take 10 mg by mouth daily.      . Calcium Carbonate-Vitamin D (CALTRATE 600+D) 600-400 MG-UNIT per tablet Take 1 tablet by mouth 2 (two) times daily.      . DULoxetine (CYMBALTA) 60 MG capsule Take 60 mg by mouth daily.      . glipiZIDE (GLUCOTROL) 5 MG tablet Take 5 mg by mouth daily.    . oxyCODONE-acetaminophen (PERCOCET/ROXICET) 5-325 MG per tablet Take 1 tablet by mouth every 6 (six) hours as needed for pain.    . pantoprazole (PROTONIX) 40 MG tablet Take 40 mg by mouth daily.    . aspirin EC 81 MG tablet Take 81 mg by mouth daily.    .   fish oil-omega-3 fatty acids 1000 MG capsule Take 1 capsule by mouth 2 (two) times daily.      . insulin aspart (NOVOLOG) 100 UNIT/ML injection Inject 15-35 Units into the skin 4 (four) times daily. 15 units with each meal and 35 units at bedtime    . naproxen (NAPROSYN) 500 MG tablet Take 500 mg by mouth 2 (two) times daily with a meal.       No current facility-administered medications on file prior to visit.     REVIEW OF SYSTEMS:  Positives indicated with an "X"  CARDIOVASCULAR:  [ ] chest pain   [ ] chest pressure   [ ] palpitations   [ ] orthopnea   [x ] dyspnea on exertion   [ ] claudication   [ ] rest pain   [ ] DVT   [ ] phlebitis PULMONARY:   [x ] productive cough   [ ] asthma   x[ ] wheezing NEUROLOGIC:   [x ] weakness  x[ ] paresthesias  [ ] aphasia  [ ] amaurosis  [x ] dizziness HEMATOLOGIC:   [ ] bleeding problems   [ ] clotting disorders MUSCULOSKELETAL:  [ ] joint pain   [ ] joint  swelling GASTROINTESTINAL: [ ]  blood in stool  [ ]  hematemesis GENITOURINARY:  [ ]  dysuria  [ ]  hematuria PSYCHIATRIC:  [ ] history of major depression INTEGUMENTARY:  [ ] rashes  [ ] ulcers CONSTITUTIONAL:  [ ] fever   [ ] chills  PHYSICAL EXAMINATION:  General: The patient is a well-nourished female, in no acute distress. Vital signs are BP 158/79 mmHg  Pulse 90  Resp 14  Ht 5' 4" (1.626 m)  Wt 201 lb (91.173 kg)  BMI 34.48 kg/m2 Pulmonary: There is a good air exchange   Musculoskeletal: There are no major deformities.  There is no significant extremity pain. Neurologic: No focal weakness or paresthesias are detected, Skin: There are no ulcer or rashes noted. Psychiatric: The patient has normal affect. Cardiovascular: 2+ radial pulses bilaterally   Surface veins are moderate size in the right and smaller on the left   [facility] Vascular Lab Studies:  Ordered and Independently Reviewed normal triphasic arterial signals at the level of the wrist. Does have a moderate-sized cephalic vein bilaterally. I imaged this myself with SonoSite ultrasound this does appear to be better throughout her right arm and is more superficial. The rain vein run significantly deeper on the left   Impression and Plan:  I discussed options with patient. Explained the possible options of catheter placement for immediate hemodialysis, AV fistula and AV graft placement. Explained the purpose of a to have fistula working prior to initiation for dialysis. Her right vein does appear to be her best option for a radiocephalic fistula. We will coordinate this at her earliest convenience as an outpatient    Kathlee Barnhardt [facility] Office: [phone]         

## 2014-09-13 NOTE — Telephone Encounter (Addendum)
-----   Message from Mena Goes, RN sent at 09/13/2014  9:16 AM EST ----- Regarding: Schedule   ----- Message -----    From: Alvia Grove, PA-C    Sent: 09/13/2014   8:37 AM      To: [facility] Charge Pool  S/p right radial cephalic AVF [id number]  F/u with Dr. Donnetta Hutching in 4 weeks with duplex  Thanks Kim   09/13/2014: no answer, no vm- mailed letter, dpm

## 2014-09-13 NOTE — Anesthesia Preprocedure Evaluation (Addendum)
Anesthesia Evaluation  Patient identified by MRN, date of birth, ID band Patient awake    Reviewed: Allergy & Precautions, NPO status , Patient's Chart, lab work & pertinent test results  Airway Mallampati: II       Dental  (+) Poor Dentition, Dental Advidsory Given   Pulmonary shortness of breath and with exertion, former smoker,  breath sounds clear to auscultation        Cardiovascular hypertension, Rhythm:Regular Rate:Normal     Neuro/Psych  Headaches, PSYCHIATRIC DISORDERS    GI/Hepatic GERD-  Poorly Controlled and Medicated,  Endo/Other  diabetes  Renal/GU ESRFRenal disease     Musculoskeletal  (+) Arthritis -,   Abdominal   Peds  Hematology  (+) anemia ,   Anesthesia Other Findings   Reproductive/Obstetrics                            Anesthesia Physical Anesthesia Plan  ASA: III  Anesthesia Plan: MAC   Post-op Pain Management:    Induction: Intravenous  Airway Management Planned: Natural Airway and Simple Face Mask  Additional Equipment:   Intra-op Plan:   Post-operative Plan:   Informed Consent:   Dental advisory given and Dental Advisory Given  Plan Discussed with: CRNA, Surgeon and Anesthesiologist  Anesthesia Plan Comments:        Anesthesia Quick Evaluation

## 2014-09-13 NOTE — Interval H&P Note (Signed)
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 After consideration of risks, benefits and other options for treatment, the patient has consented to  Procedure(s): RADIOCEPHALIC ARTERIOVENOUS (AV) FISTULA CREATION (Right) as a surgical intervention .  The patient's history has been reviewed, patient examined, no change in status, stable for surgery.  I have reviewed the patient's chart and labs.  Questions were answered to the patient's satisfaction.     Charlyn Vialpando

## 2014-09-16 ENCOUNTER — Encounter (HOSPITAL_COMMUNITY): Payer: Self-pay | Admitting: Vascular Surgery

## 2014-10-11 ENCOUNTER — Encounter: Payer: Self-pay | Admitting: Nephrology

## 2014-10-14 ENCOUNTER — Encounter: Payer: Self-pay | Admitting: Vascular Surgery

## 2014-10-15 ENCOUNTER — Ambulatory Visit (INDEPENDENT_AMBULATORY_CARE_PROVIDER_SITE_OTHER): Payer: Self-pay | Admitting: Vascular Surgery

## 2014-10-15 ENCOUNTER — Ambulatory Visit (HOSPITAL_COMMUNITY)
Admission: RE | Admit: 2014-10-15 | Discharge: 2014-10-15 | Disposition: A | Discharge status: Home / Self Care | Payer: Medicare Other | Source: Ambulatory Visit | Attending: Vascular Surgery | Admitting: Vascular Surgery

## 2014-10-15 ENCOUNTER — Encounter: Payer: Self-pay | Admitting: Vascular Surgery

## 2014-10-15 VITALS — BP 179/82 | HR 102 | Ht 64.0 in | Wt 202.0 lb

## 2014-10-15 DIAGNOSIS — N186 End stage renal disease: Secondary | ICD-10-CM | POA: Insufficient documentation

## 2014-10-15 DIAGNOSIS — Z4931 Encounter for adequacy testing for hemodialysis: Secondary | ICD-10-CM | POA: Insufficient documentation

## 2014-10-15 DIAGNOSIS — N184 Chronic kidney disease, stage 4 (severe): Secondary | ICD-10-CM

## 2014-10-15 NOTE — Progress Notes (Signed)
Patient resisted for follow-up of right wrist Cimino AV fistula creation by myself on 09/13/2014. She reports that she is not on hemodialysis and has stable renal insufficiency currently.  On physical exam: She has excellent Jase Himmelberger maturation in her Cimino fistula. Her cephalic vein is close to the surface and has good size maturation. She has an excellent thrill in the fistula as well.  She underwent venous duplex today of this. This shows good size maturation with a diameter ranging between 5 and 6 mm throughout its course. She does have some elevated velocities at the anastomosis but this may be related to the configuration anastomosis. She certainly does not have any clinical evidence of flow limitation.  Impression and plan: Excellent Deatra Mcmahen maturation of her fistula. She will continue her usual activities. She will follow-up with [place] on as-needed basis. I think that she has a very good chance of having a usable fistula if she progresses to renal failure. Would recommend waiting a total of 3 months from the time of placement before access of her fistula

## 2015-10-28 ENCOUNTER — Encounter (INDEPENDENT_AMBULATORY_CARE_PROVIDER_SITE_OTHER): Payer: Self-pay | Admitting: *Deleted

## 2015-11-19 ENCOUNTER — Encounter (INDEPENDENT_AMBULATORY_CARE_PROVIDER_SITE_OTHER): Payer: Self-pay | Admitting: *Deleted

## 2015-11-19 ENCOUNTER — Ambulatory Visit (INDEPENDENT_AMBULATORY_CARE_PROVIDER_SITE_OTHER): Payer: Medicare Other | Admitting: Internal Medicine

## 2015-11-19 ENCOUNTER — Other Ambulatory Visit (INDEPENDENT_AMBULATORY_CARE_PROVIDER_SITE_OTHER): Payer: Self-pay | Admitting: *Deleted

## 2015-11-19 ENCOUNTER — Other Ambulatory Visit (INDEPENDENT_AMBULATORY_CARE_PROVIDER_SITE_OTHER): Payer: Self-pay | Admitting: Internal Medicine

## 2015-11-19 ENCOUNTER — Encounter (INDEPENDENT_AMBULATORY_CARE_PROVIDER_SITE_OTHER): Payer: Self-pay | Admitting: Internal Medicine

## 2015-11-19 VITALS — BP 136/84 | HR 64 | Temp 97.8°F | Ht 64.0 in | Wt 192.8 lb

## 2015-11-19 DIAGNOSIS — R195 Other fecal abnormalities: Secondary | ICD-10-CM

## 2015-11-19 NOTE — Progress Notes (Signed)
Subjective:    Patient ID: Tanya Lucero, female    DOB: 1950/12/12, 65 y.o.   MRN: [id number]  HPI Referred by Dr. Hinda Lenis for 3 positive stool cards. Hx of CRF, stage 5 and is followed by Dr. Hinda Lenis. Has been seeing Dr Hinda Lenis for about 2 years.  Patient states she really cannot tell me if she has seen any blood in her stools.  Her stools are dark per patient from the iron she takes. Hx of chronic anemia.  Appetite is good. No weight loss. No abdominal pain. She usually has a BM 1-2 a day.   Hx of chronic renal failure, diabetes for several years, and hypertension. Fistula in rtfore arm.  Hx of anemia for several months.  Receives Procrit injections 12,000 units every 2 weeks for anemia.   Hx of colon cancer in a 1st and 2nd cousin who are now deceased.   10/17/15 Iron 55.43, TIBC 201, %Saturation 28 10-17-15: Hemoglobin 8.7, Hematocrit 25.8 09/30/2015 Ferritin 434 10/17/2015 Glucose 210, BUN 55, Creatinine 3.70, GFR 12,     Review of Systems Past Medical History  Diagnosis Date  . Hypertension   . GERD (gastroesophageal reflux disease)   . Skin cancer   . Anxiety   . Depression   . Chronic kidney disease     neuphrotic syndrome-05/2013  . Arthritis   . Gout   . Anemia   . Diabetes mellitus     Type 2  . Shortness of breath dyspnea   . Headache   . Diabetic neuropathy ([place])   . Constipation   . Dry skin     Past Surgical History  Procedure Laterality Date  . Foot surgery Right     x2-heel spur   . Skin cancer excision      forehead  . Abdominal hysterectomy    . Tonsillectomy    . Adenoidectomy    . Cataract extraction w/phaco Left 07/02/2013    Procedure: CATARACT EXTRACTION PHACO AND INTRAOCULAR LENS PLACEMENT (IOC);  Surgeon: [name], MD;  Location: [facility];  Service: Ophthalmology;  Laterality: Left;  CDE:  6.46  . Cataract extraction w/phaco Right 07/12/2013    Procedure: CATARACT EXTRACTION PHACO AND INTRAOCULAR LENS PLACEMENT (IOC);  Surgeon:  [name], MD;  Location: [facility];  Service: Ophthalmology;  Laterality: Right;  CDE:12.48  . Back surgery      lumbar -   . Appendectomy      taken out with hysterectomy  . Av fistula placement Right 09/13/2014    Procedure: Creation of Right Arm RADIOCEPHALIC ARTERIOVENOUS (AV) FISTULA ;  Surgeon: [name], MD;  Location: [facility];  Service: Vascular;  Laterality: Right;    Allergies  Allergen Reactions  . Penicillins     REACTION: rash    Current Outpatient Prescriptions on File Prior to Visit  Medication Sig Dispense Refill  . acetaminophen (TYLENOL) 325 MG tablet Take 650 mg by mouth every 6 (six) hours as needed for headache.    . ALPRAZolam (XANAX) 0.5 MG tablet Take 0.5 mg by mouth 2 (two) times daily.      Marland Kitchen b complex vitamins capsule Take 1 capsule by mouth daily.    . calcium carbonate (OS-CAL) 600 MG TABS tablet Take 600 mg by mouth daily with breakfast.    . cholecalciferol (VITAMIN D) 1000 UNITS tablet Take 1,000 Units by mouth daily.    Marland Kitchen docusate sodium (COLACE) 100 MG capsule Take 100 mg by mouth 2 (two) times  daily.    . DULoxetine (CYMBALTA) 60 MG capsule Take 60 mg by mouth daily.      . ferrous fumarate-iron polysaccharide complex (TANDEM) 162-115.2 MG CAPS Take 1 capsule by mouth daily with breakfast.    . furosemide (LASIX) 20 MG tablet Take 20 mg by mouth daily.    Marland Kitchen gabapentin (NEURONTIN) 300 MG capsule Take 300 mg by mouth 3 (three) times daily.    Marland Kitchen glipiZIDE (GLUCOTROL) 5 MG tablet Take 5 mg by mouth 2 (two) times daily before a meal.     . hydrALAZINE (APRESOLINE) 50 MG tablet Take 1 tablet by mouth daily.  3  . insulin lispro protamine-lispro (HUMALOG 75/25 MIX) (75-25) 100 UNIT/ML SUSP injection Inject 22 Units into the skin 3 (three) times daily before meals.     . IRON PO Take 1 tablet by mouth daily.     Marland Kitchen oxyCODONE-acetaminophen (PERCOCET) 10-325 MG per tablet Take 1 tablet by mouth at bedtime. 15 tablet 0  . sodium bicarbonate 650 MG tablet Take 650  mg by mouth 3 (three) times daily.     No current facility-administered medications on file prior to visit.        Objective:   Physical Exam Blood pressure 136/84, pulse 64, temperature 97.8 F (36.6 C), height 5\' 4"  (1.626 m), weight 192 lb 12.8 oz (87.454 kg). Alert and oriented. Skin warm and dry. Oral mucosa is moist.   . Sclera anicteric, conjunctivae is pink. Thyroid not enlarged. No cervical lymphadenopathy. Lungs clear. Heart regular rate and rhythm.  Abdomen is soft. Bowel sounds are positive. No hepatomegaly. No abdominal masses felt. No tenderness.  No edema to lower extremities.  Stool brown and guaiac negative   Lot [id number] Ex 9/17     Assessment & Plan:  Guaiac + stool. She has never undergone a colonoscopy in the past. Colonic neoplasm needs to be ruled out.  The risks and benefits such as perforation, bleeding, and infection were reviewed with the patient and is agreeable.

## 2015-11-19 NOTE — Patient Instructions (Signed)
Colonoscopy.  The risks and benefits such as perforation, bleeding, and infection were reviewed with the patient and is agreeable. 

## 2015-11-19 NOTE — Telephone Encounter (Signed)
Patient needs trilyte 

## 2015-11-20 MED ORDER — PEG 3350-KCL-NA BICARB-NACL 420 G PO SOLR: 4000.0000 mL | Freq: Once | ORAL | Status: DC

## 2015-11-21 ENCOUNTER — Encounter (INDEPENDENT_AMBULATORY_CARE_PROVIDER_SITE_OTHER): Payer: Self-pay

## 2015-12-11 ENCOUNTER — Ambulatory Visit (HOSPITAL_COMMUNITY)
Admission: RE | Admit: 2015-12-11 | Discharge: 2015-12-11 | Disposition: A | Discharge status: Home / Self Care | Payer: Medicare Other | Source: Ambulatory Visit | Attending: Internal Medicine | Admitting: Internal Medicine

## 2015-12-11 ENCOUNTER — Encounter (HOSPITAL_COMMUNITY)
Admission: RE | Disposition: A | Discharge status: Home / Self Care | Payer: Self-pay | Source: Ambulatory Visit | Attending: Internal Medicine

## 2015-12-11 ENCOUNTER — Encounter (HOSPITAL_COMMUNITY): Payer: Self-pay

## 2015-12-11 DIAGNOSIS — D127 Benign neoplasm of rectosigmoid junction: Secondary | ICD-10-CM | POA: Insufficient documentation

## 2015-12-11 DIAGNOSIS — Z79891 Long term (current) use of opiate analgesic: Secondary | ICD-10-CM | POA: Diagnosis not present

## 2015-12-11 DIAGNOSIS — N189 Chronic kidney disease, unspecified: Secondary | ICD-10-CM | POA: Insufficient documentation

## 2015-12-11 DIAGNOSIS — I129 Hypertensive chronic kidney disease with stage 1 through stage 4 chronic kidney disease, or unspecified chronic kidney disease: Secondary | ICD-10-CM | POA: Diagnosis not present

## 2015-12-11 DIAGNOSIS — K635 Polyp of colon: Secondary | ICD-10-CM | POA: Diagnosis not present

## 2015-12-11 DIAGNOSIS — E1140 Type 2 diabetes mellitus with diabetic neuropathy, unspecified: Secondary | ICD-10-CM | POA: Diagnosis not present

## 2015-12-11 DIAGNOSIS — D123 Benign neoplasm of transverse colon: Secondary | ICD-10-CM | POA: Diagnosis not present

## 2015-12-11 DIAGNOSIS — F419 Anxiety disorder, unspecified: Secondary | ICD-10-CM | POA: Diagnosis not present

## 2015-12-11 DIAGNOSIS — R195 Other fecal abnormalities: Secondary | ICD-10-CM | POA: Diagnosis present

## 2015-12-11 DIAGNOSIS — K644 Residual hemorrhoidal skin tags: Secondary | ICD-10-CM | POA: Diagnosis not present

## 2015-12-11 DIAGNOSIS — D500 Iron deficiency anemia secondary to blood loss (chronic): Secondary | ICD-10-CM | POA: Insufficient documentation

## 2015-12-11 DIAGNOSIS — E1122 Type 2 diabetes mellitus with diabetic chronic kidney disease: Secondary | ICD-10-CM | POA: Diagnosis not present

## 2015-12-11 DIAGNOSIS — F329 Major depressive disorder, single episode, unspecified: Secondary | ICD-10-CM | POA: Diagnosis not present

## 2015-12-11 DIAGNOSIS — Z79899 Other long term (current) drug therapy: Secondary | ICD-10-CM | POA: Insufficient documentation

## 2015-12-11 DIAGNOSIS — F17210 Nicotine dependence, cigarettes, uncomplicated: Secondary | ICD-10-CM | POA: Insufficient documentation

## 2015-12-11 DIAGNOSIS — K5730 Diverticulosis of large intestine without perforation or abscess without bleeding: Secondary | ICD-10-CM | POA: Diagnosis not present

## 2015-12-11 DIAGNOSIS — K219 Gastro-esophageal reflux disease without esophagitis: Secondary | ICD-10-CM | POA: Diagnosis not present

## 2015-12-11 DIAGNOSIS — Z794 Long term (current) use of insulin: Secondary | ICD-10-CM | POA: Insufficient documentation

## 2015-12-11 DIAGNOSIS — M1990 Unspecified osteoarthritis, unspecified site: Secondary | ICD-10-CM | POA: Insufficient documentation

## 2015-12-11 DIAGNOSIS — D120 Benign neoplasm of cecum: Secondary | ICD-10-CM | POA: Diagnosis not present

## 2015-12-11 HISTORY — PX: COLONOSCOPY: SHX5424

## 2015-12-11 LAB — GLUCOSE, CAPILLARY: Glucose-Capillary: 188 mg/dL — ABNORMAL HIGH (ref 65–99)

## 2015-12-11 SURGERY — COLONOSCOPY
Anesthesia: Moderate Sedation

## 2015-12-11 MED ORDER — MIDAZOLAM HCL 5 MG/5ML IJ SOLN
INTRAMUSCULAR | Status: AC
  Filled 2015-12-11: qty 10

## 2015-12-11 MED ORDER — SODIUM CHLORIDE 0.9 % IV SOLN
INTRAVENOUS | Status: DC
  Administered 2015-12-11: 14:00:00 via INTRAVENOUS

## 2015-12-11 MED ORDER — MEPERIDINE HCL 50 MG/ML IJ SOLN
INTRAMUSCULAR | Status: DC | PRN
  Administered 2015-12-11 (×2): 25 mg via INTRAVENOUS

## 2015-12-11 MED ORDER — SIMETHICONE 40 MG/0.6ML PO SUSP
ORAL | Status: DC | PRN
  Administered 2015-12-11: 15:00:00

## 2015-12-11 MED ORDER — MEPERIDINE HCL 50 MG/ML IJ SOLN
INTRAMUSCULAR | Status: AC
  Filled 2015-12-11: qty 1

## 2015-12-11 MED ORDER — MIDAZOLAM HCL 5 MG/5ML IJ SOLN
INTRAMUSCULAR | Status: DC | PRN
  Administered 2015-12-11: 2 mg via INTRAVENOUS
  Administered 2015-12-11: 1 mg via INTRAVENOUS
  Administered 2015-12-11: 3 mg via INTRAVENOUS
  Administered 2015-12-11: 2 mg via INTRAVENOUS

## 2015-12-11 NOTE — Discharge Instructions (Signed)
No aspirin or NSAIDs for 1 week. Resume usual medications and high fiber diet. No driving for 24 hours. Physician will call with biopsy results.  Colonoscopy, Care After Refer to this sheet in the next few weeks. These instructions provide you with information on caring for yourself after your procedure. Your health care provider may also give you more specific instructions. Your treatment has been planned according to current medical practices, but problems sometimes occur. Call your health care provider if you have any problems or questions after your procedure. WHAT TO EXPECT AFTER THE PROCEDURE  After your procedure, it is typical to have the following:  A small amount of blood in your stool.  Moderate amounts of gas and mild abdominal cramping or bloating. HOME CARE INSTRUCTIONS  Do not drive, operate machinery, or sign important documents for 24 hours.  You may shower and resume your regular physical activities, but move at a slower pace for the first 24 hours.  Take frequent rest periods for the first 24 hours.  Walk around or put a warm pack on your abdomen to help reduce abdominal cramping and bloating.  Drink enough fluids to keep your urine clear or pale yellow.  You may resume your normal diet as instructed by your health care provider. Avoid heavy or fried foods that are hard to digest.  Avoid drinking alcohol for 24 hours or as instructed by your health care provider.  Only take over-the-counter or prescription medicines as directed by your health care provider.  If a tissue sample (biopsy) was taken during your procedure:  Do not take aspirin or blood thinners for 7 days, or as instructed by your health care provider.  Do not drink alcohol for 7 days, or as instructed by your health care provider.  Eat soft foods for the first 24 hours. SEEK MEDICAL CARE IF: You have persistent spotting of blood in your stool 2-3 days after the procedure. SEEK IMMEDIATE MEDICAL  CARE IF:  You have more than a small spotting of blood in your stool.  You pass large blood clots in your stool.  Your abdomen is swollen (distended).  You have nausea or vomiting.  You have a fever.  You have increasing abdominal pain that is not relieved with medicine.   This information is not intended to replace advice given to you by your health care provider. Make sure you discuss any questions you have with your health care provider.   Document Released: 04/06/2004 Document Revised: 06/13/2013 Document Reviewed: 04/30/2013 Elsevier Interactive Patient Education 2016 Elsevier Inc.  Colon Polyps Polyps are lumps of extra tissue growing inside the body. Polyps can grow in the large intestine (colon). Most colon polyps are noncancerous (benign). However, some colon polyps can become cancerous over time. Polyps that are larger than a pea may be harmful. To be safe, caregivers remove and test all polyps. CAUSES  Polyps form when mutations in the genes cause your cells to grow and divide even though no more tissue is needed. RISK FACTORS There are a number of risk factors that can increase your chances of getting colon polyps. They include:  Being older than 50 years.  Family history of colon polyps or colon cancer.  Long-term colon diseases, such as colitis or Crohn disease.  Being overweight.  Smoking.  Being inactive.  Drinking too much alcohol. SYMPTOMS  Most small polyps do not cause symptoms. If symptoms are present, they may include:  Blood in the stool. The stool may look dark red  or black.  Constipation or diarrhea that lasts longer than 1 week. DIAGNOSIS People often do not know they have polyps until their caregiver finds them during a regular checkup. Your caregiver can use 4 tests to check for polyps:  Digital rectal exam. The caregiver wears gloves and feels inside the rectum. This test would find polyps only in the rectum.  Barium enema. The caregiver  puts a liquid called barium into your rectum before taking X-rays of your colon. Barium makes your colon look white. Polyps are dark, so they are easy to see in the X-ray pictures.  Sigmoidoscopy. A thin, flexible tube (sigmoidoscope) is placed into your rectum. The sigmoidoscope has a light and tiny camera in it. The caregiver uses the sigmoidoscope to look at the last third of your colon.  Colonoscopy. This test is like sigmoidoscopy, but the caregiver looks at the entire colon. This is the most common method for finding and removing polyps. TREATMENT  Any polyps will be removed during a sigmoidoscopy or colonoscopy. The polyps are then tested for cancer. PREVENTION  To help lower your risk of getting more colon polyps:  Eat plenty of fruits and vegetables. Avoid eating fatty foods.  Do not smoke.  Avoid drinking alcohol.  Exercise every day.  Lose weight if recommended by your caregiver.  Eat plenty of calcium and folate. Foods that are rich in calcium include milk, cheese, and broccoli. Foods that are rich in folate include chickpeas, kidney beans, and spinach. HOME CARE INSTRUCTIONS Keep all follow-up appointments as directed by your caregiver. You may need periodic exams to check for polyps. SEEK MEDICAL CARE IF: You notice bleeding during a bowel movement.   This information is not intended to replace advice given to you by your health care provider. Make sure you discuss any questions you have with your health care provider.   Document Released: 05/19/2004 Document Revised: 09/13/2014 Document Reviewed: 11/02/2011 Elsevier Interactive Patient Education 2016 Elsevier Inc.  High-Fiber Diet Fiber, also called dietary fiber, is a type of carbohydrate found in fruits, vegetables, whole grains, and beans. A high-fiber diet can have many health benefits. Your health care provider may recommend a high-fiber diet to help:  Prevent constipation. Fiber can make your bowel movements  more regular.  Lower your cholesterol.  Relieve hemorrhoids, uncomplicated diverticulosis, or irritable bowel syndrome.  Prevent overeating as part of a weight-loss plan.  Prevent heart disease, type 2 diabetes, and certain cancers. WHAT IS MY PLAN? The recommended daily intake of fiber includes:  38 grams for men under age 78.  52 grams for men over age 21.  64 grams for women under age 62.  32 grams for women over age 74. You can get the recommended daily intake of dietary fiber by eating a variety of fruits, vegetables, grains, and beans. Your health care provider may also recommend a fiber supplement if it is not possible to get enough fiber through your diet. WHAT DO I NEED TO KNOW ABOUT A HIGH-FIBER DIET?  Fiber supplements have not been widely studied for their effectiveness, so it is better to get fiber through food sources.  Always check the fiber content on thenutrition facts label of any prepackaged food. Look for foods that contain at least 5 grams of fiber per serving.  Ask your dietitian if you have questions about specific foods that are related to your condition, especially if those foods are not listed in the following section.  Increase your daily fiber consumption gradually. Increasing your  intake of dietary fiber too quickly may cause bloating, cramping, or gas.  Drink plenty of water. Water helps you to digest fiber. WHAT FOODS CAN I EAT? Grains Whole-grain breads. Multigrain cereal. Oats and oatmeal. Brown rice. Barley. Bulgur wheat. [place]. Bran muffins. Popcorn. Rye wafer crackers. Vegetables Sweet potatoes. Spinach. Kale. Artichokes. Cabbage. Broccoli. Green peas. Carrots. Squash. Fruits Berries. Pears. Apples. Oranges. Avocados. Prunes and raisins. Dried figs. Meats and Other Protein Sources [place], kidney, pinto, and soy beans. Split peas. Lentils. Nuts and seeds. Dairy Fiber-fortified yogurt. Beverages Fiber-fortified soy milk. Fiber-fortified  orange juice. Other Fiber bars. The items listed above may not be a complete list of recommended foods or beverages. Contact your dietitian for more options. WHAT FOODS ARE NOT RECOMMENDED? Grains White bread. Pasta made with refined flour. White rice. Vegetables Fried potatoes. Canned vegetables. Well-cooked vegetables.  Fruits Fruit juice. Cooked, strained fruit. Meats and Other Protein Sources Fatty cuts of meat. Fried Sales executive or fried fish. Dairy Milk. Yogurt. Cream cheese. Sour cream. Beverages Soft drinks. Other Cakes and pastries. Butter and oils. The items listed above may not be a complete list of foods and beverages to avoid. Contact your dietitian for more information. WHAT ARE SOME TIPS FOR INCLUDING HIGH-FIBER FOODS IN MY DIET?  Eat a wide variety of high-fiber foods.  Make sure that half of all grains consumed each day are whole grains.  Replace breads and cereals made from refined flour or white flour with whole-grain breads and cereals.  Replace white rice with brown rice, bulgur wheat, or millet.  Start the day with a breakfast that is high in fiber, such as a cereal that contains at least 5 grams of fiber per serving.  Use beans in place of meat in soups, salads, or pasta.  Eat high-fiber snacks, such as berries, raw vegetables, nuts, or popcorn.   This information is not intended to replace advice given to you by your health care provider. Make sure you discuss any questions you have with your health care provider.   Document Released: 08/23/2005 Document Revised: 09/13/2014 Document Reviewed: 02/05/2014 Elsevier Interactive Patient Education [employer].

## 2015-12-11 NOTE — Op Note (Signed)
[facility] Patient Name: Tanya Lucero Procedure Date: 12/11/2015 2:41 PM MRN: [id number] Date of Birth: 04/19/1951 Attending MD: Hildred Laser , MD CSN: [id number] Age: 65 Admit Type: Outpatient Procedure:                Colonoscopy Indications:              Heme positive stool, Iron deficiency anemia                            secondary to chronic blood loss Providers:                Hildred Laser, MD, Rometta Emery, RN, Bonnetta Barry, Technician Referring MD:             Fran Lowes MD (Referring MD) Medicines:                Meperidine 50 mg IV, Midazolam 8 mg IV Complications:            No immediate complications. Estimated Blood Loss:     Estimated blood loss was minimal. Procedure:                Pre-Anesthesia Assessment:                           - Prior to the procedure, a History and Physical                            was performed, and patient medications and                            allergies were reviewed. The patient's tolerance of                            previous anesthesia was also reviewed. The risks                            and benefits of the procedure and the sedation                            options and risks were discussed with the patient.                            All questions were answered, and informed consent                            was obtained. Prior Anticoagulants: The patient has                            taken no previous anticoagulant or antiplatelet                            agents. ASA Grade Assessment: III - A patient with  severe systemic disease. After reviewing the risks                            and benefits, the patient was deemed in                            satisfactory condition to undergo the procedure.                           After obtaining informed consent, the colonoscope                            was passed under direct vision. Throughout the                           procedure, the patient's blood pressure, pulse, and                            oxygen saturations were monitored continuously. The                            [id number]) scope was introduced through                            the anus and advanced to the the cecum, identified                            by appendiceal orifice and ileocecal valve. The                            colonoscopy was performed without difficulty. The                            patient tolerated the procedure well. The quality                            of the bowel preparation was adequate. The                            ileocecal valve, appendiceal orifice, and rectum                            were photographed. Scope In: 2:57:39 PM Scope Out: 3:25:45 PM Scope Withdrawal Time: 0 hours 23 minutes 3 seconds  Total Procedure Duration: 0 hours 28 minutes 6 seconds  Findings:      Two sessile polyps were found in the recto-sigmoid colon and cecum. The       polyps were 4 to 5 mm in size. These polyps were removed with a cold       snare. Resection and retrieval were complete. The pathology specimen was       placed into Bottle Number 2. Estimated blood loss was minimal.      A 12 mm polyp was found in the hepatic flexure. The polyp was       semi-pedunculated. The polyp was removed  with a hot snare. Resection and       retrieval were complete.      A 8 mm polyp was found in the transverse colon. The polyp was sessile.       The polyp was removed with a hot snare. Resection and retrieval were       complete.      A few small-mouthed diverticula were found in the sigmoid colon.      External hemorrhoids were found during retroflexion. The hemorrhoids       were small. Impression:               - Two 4 to 5 mm polyps at the recto-sigmoid colon                            and in the cecum, removed with a cold snare.                            Resected and retrieved.                            - One 12 mm polyp at the hepatic flexure, removed                            with a hot snare. Resected and retrieved.                           - One 8 mm polyp in the transverse colon, removed                            with a hot snare. Resected and retrieved.                           - Diverticulosis in the sigmoid colon.                           - External hemorrhoids. Moderate Sedation:      Moderate (conscious) sedation was administered by the endoscopy nurse       and supervised by the endoscopist. The following parameters were       monitored: oxygen saturation, heart rate, blood pressure, CO2       capnography and response to care. Total physician intraservice time was       31 minutes. Recommendation:           - Patient has a contact number available for                            emergencies. The signs and symptoms of potential                            delayed complications were discussed with the                            patient. Return to normal activities tomorrow.  Written discharge instructions were provided to the                            patient.                           - High fiber diet today.                           - Continue present medications.                           - No aspirin, ibuprofen, naproxen, or other                            non-steroidal anti-inflammatory drugs for 7 days                            after polyp removal.                           - Await pathology results.                           - Repeat colonoscopy for surveillance based on                            pathology results. Procedure Code(s):        --- Professional ---                           [phone], Colonoscopy, flexible; with removal of                            tumor(s), polyp(s), or other lesion(s) by snare                            technique                           99152, Moderate sedation services provided by the                             same physician or other qualified health care                            professional performing the diagnostic or                            therapeutic service that the sedation supports,                            requiring the presence of an independent trained                            observer to assist in the monitoring of the  patient's level of consciousness and physiological                            status; initial 15 minutes of intraservice time,                            patient age 65 years or older                           [phone], Moderate sedation services; each additional                            15 minutes intraservice time Diagnosis Code(s):        --- Professional ---                           D12.7, Benign neoplasm of rectosigmoid junction                           D12.0, Benign neoplasm of cecum                           D12.3, Benign neoplasm of transverse colon (hepatic                            flexure or splenic flexure)                           K64.4, Residual hemorrhoidal skin tags                           R19.5, Other fecal abnormalities                           K57.30, Diverticulosis of large intestine without                            perforation or abscess without bleeding CPT copyright 2016 American [employer]. All rights reserved. The codes documented in this report are preliminary and upon coder review may  be revised to meet current compliance requirements. Hildred Laser, MD Hildred Laser, MD 12/11/2015 3:36:31 PM This report has been signed electronically. Number of Addenda: 0

## 2015-12-11 NOTE — H&P (Signed)
Tanya Lucero is an 65 y.o. female.   Chief Complaint: Patient is here for colonoscopy. HPI: Patient is 65 year old Caucasian female with multiple medical problems including chronic kidney disease who also has chronic anemia. She was recently found to have heme-positive stools. She denies melena or rectal bleeding and diarrhea. She has good appetite. She has chronic GERD and heartburns well controlled with therapy. She does not take OTC NSAIDs. Family history is negative for CRC.  Past Medical History  Diagnosis Date  . Hypertension   . GERD (gastroesophageal reflux disease)   . Anxiety   . Depression   . Chronic kidney disease     neuphrotic syndrome-05/2013  . Arthritis   . Gout   . Anemia   . Diabetes mellitus     Type 2  . Shortness of breath dyspnea   . Headache   . Diabetic neuropathy ([place])   . Constipation   . Dry skin   . Skin cancer     Past Surgical History  Procedure Laterality Date  . Foot surgery Right     x2-heel spur   . Skin cancer excision      forehead  . Abdominal hysterectomy    . Tonsillectomy    . Adenoidectomy    . Cataract extraction w/phaco Left 07/02/2013    Procedure: CATARACT EXTRACTION PHACO AND INTRAOCULAR LENS PLACEMENT (IOC);  Surgeon: [name], MD;  Location: [facility];  Service: Ophthalmology;  Laterality: Left;  CDE:  6.46  . Cataract extraction w/phaco Right 07/12/2013    Procedure: CATARACT EXTRACTION PHACO AND INTRAOCULAR LENS PLACEMENT (IOC);  Surgeon: [name], MD;  Location: [facility];  Service: Ophthalmology;  Laterality: Right;  CDE:12.48  . Back surgery      lumbar -   . Appendectomy      taken out with hysterectomy  . Av fistula placement Right 09/13/2014    Procedure: Creation of Right Arm RADIOCEPHALIC ARTERIOVENOUS (AV) FISTULA ;  Surgeon: [name], MD;  Location: [facility] OR;  Service: Vascular;  Laterality: Right;    Family History  Problem Relation Age of Onset  . Multiple myeloma Mother   . Diabetes Mother   . Heart  attack Father 39   Social History:  reports that she has been smoking Cigarettes.  She has a 20 pack-year smoking history. She has never used smokeless tobacco. She reports that she does not drink alcohol or use illicit drugs.  Allergies:  Allergies  Allergen Reactions  . Penicillins     REACTION: rash    Medications Prior to Admission  Medication Sig Dispense Refill  . acetaminophen (TYLENOL) 325 MG tablet Take 650 mg by mouth every 6 (six) hours as needed for headache.    . ALPRAZolam (XANAX) 0.5 MG tablet Take 0.5 mg by mouth 2 (two) times daily.      Marland Kitchen amLODipine (NORVASC) 10 MG tablet Take 10 mg by mouth daily.    Marland Kitchen b complex vitamins capsule Take 1 capsule by mouth daily.    . calcitRIOL (ROCALTROL) 0.25 MCG capsule Take 0.25 mcg by mouth daily. M-W-F    . calcium carbonate (OS-CAL) 600 MG TABS tablet Take 600 mg by mouth daily with breakfast.    . carvedilol (COREG) 6.25 MG tablet Take 6.25 mg by mouth 2 (two) times daily with a meal.    . cholecalciferol (VITAMIN D) 1000 UNITS tablet Take 1,000 Units by mouth daily.    Marland Kitchen docusate sodium (COLACE) 100 MG capsule Take 100 mg  by mouth 2 (two) times daily.    . DULoxetine (CYMBALTA) 60 MG capsule Take 60 mg by mouth daily.      . ferrous fumarate-iron polysaccharide complex (TANDEM) 162-115.2 MG CAPS Take 1 capsule by mouth daily with breakfast.    . furosemide (LASIX) 20 MG tablet Take 20 mg by mouth daily.    Marland Kitchen gabapentin (NEURONTIN) 300 MG capsule Take 300 mg by mouth 3 (three) times daily.    Marland Kitchen glipiZIDE (GLUCOTROL) 5 MG tablet Take 5 mg by mouth 2 (two) times daily before a meal.     . hydrALAZINE (APRESOLINE) 50 MG tablet Take 1 tablet by mouth daily.  3  . insulin glargine (LANTUS) 100 UNIT/ML injection Inject 40 Units into the skin at bedtime.    . insulin lispro protamine-lispro (HUMALOG 75/25 MIX) (75-25) 100 UNIT/ML SUSP injection Inject 22 Units into the skin 3 (three) times daily before meals.     . IRON PO Take 1 tablet  by mouth daily.     Marland Kitchen oxyCODONE-acetaminophen (PERCOCET) 10-325 MG per tablet Take 1 tablet by mouth at bedtime. 15 tablet 0  . pantoprazole (PROTONIX) 40 MG tablet Take 40 mg by mouth daily.    . polyethylene glycol-electrolytes (NULYTELY/GOLYTELY) 420 g solution Take 4,000 mLs by mouth once. 4000 mL 0  . sodium bicarbonate 650 MG tablet Take 650 mg by mouth 3 (three) times daily.      Results for orders placed or performed during the hospital encounter of 12/11/15 (from the past 48 hour(s))  Glucose, capillary     Status: Abnormal   Collection Time: 12/11/15  2:04 PM  Result Value Ref Range   Glucose-Capillary 188 (H) 65 - 99 mg/dL   No results found.  ROS  Blood pressure 176/75, pulse 80, temperature 97.9 F (36.6 C), temperature source Oral, resp. rate 14, height '5\' 4"'  (1.626 m), weight 194 lb (87.998 kg), SpO2 100 %. Physical Exam  Constitutional: She appears well-developed and well-nourished.  HENT:  Mouth/Throat: Oropharynx is clear and moist.  Eyes: Conjunctivae are normal. No scleral icterus.  Neck: No thyromegaly present.  Cardiovascular: Normal rate, regular rhythm and normal heart sounds.   No murmur heard. Respiratory: Effort normal and breath sounds normal.  GI: Soft. She exhibits no distension and no mass. There is no tenderness.  Musculoskeletal: She exhibits edema (trace edema around ankles.).  Lymphadenopathy:    She has no cervical adenopathy.  Neurological: She is alert.  Skin: Skin is warm and dry.     Assessment/Plan Heme-positive stool. Chronic anemia. Iron studies not consistent with iron deficiency. Diagnostic colonoscopy.  [name], MD 12/11/2015, 2:48 PM

## 2015-12-12 ENCOUNTER — Encounter: Payer: Self-pay | Admitting: Vascular Surgery

## 2015-12-16 ENCOUNTER — Encounter (HOSPITAL_COMMUNITY): Payer: Self-pay | Admitting: Internal Medicine

## 2015-12-23 ENCOUNTER — Ambulatory Visit (INDEPENDENT_AMBULATORY_CARE_PROVIDER_SITE_OTHER): Payer: Medicare Other | Admitting: Vascular Surgery

## 2015-12-23 ENCOUNTER — Encounter: Payer: Self-pay | Admitting: Vascular Surgery

## 2015-12-23 VITALS — BP 164/72 | HR 76 | Temp 97.2°F | Resp 18 | Ht 64.0 in | Wt 161.2 lb

## 2015-12-23 DIAGNOSIS — N184 Chronic kidney disease, stage 4 (severe): Secondary | ICD-10-CM

## 2015-12-23 NOTE — Progress Notes (Signed)
Filed Vitals:   12/23/15 1531 12/23/15 1536  BP: 163/75 164/72  Pulse: 76   Temp: 97.2 F (36.2 C)   TempSrc: Oral   Resp: 18   Height: 5\' 4"  (1.626 m)   Weight: 161 lb 3.2 oz (73.12 kg)   SpO2: 96%

## 2015-12-23 NOTE — Progress Notes (Signed)
[facility]  Patient name: Tanya Lucero MRN: [id number] DOB: 04-30-1951 Sex: female  REASON FOR VISIT: Evaluation of right forearm AV fistula  HPI: Tanya Lucero is a 65 y.o. female seen today for evaluation of her right forearm AV fistula. This was created by myself on 09/13/2014 she is still not on hemodialysis. Her renal insufficiency has been progressive and her nephrologist is asked that we see her to determine if revision is necessary of her fistula. She has no steal symptoms.  Past Medical History  Diagnosis Date  . Hypertension   . GERD (gastroesophageal reflux disease)   . Anxiety   . Depression   . Chronic kidney disease     neuphrotic syndrome-05/2013  . Arthritis   . Gout   . Anemia   . Diabetes mellitus     Type 2  . Shortness of breath dyspnea   . Headache   . Diabetic neuropathy ([place])   . Constipation   . Dry skin   . Skin cancer   . Decreased vision     left eye    Family History  Problem Relation Age of Onset  . Multiple myeloma Mother   . Diabetes Mother   . Heart attack Father 66    SOCIAL HISTORY: Social History  Substance Use Topics  . Smoking status: Current Every Day Smoker -- 0.25 packs/day for 40 years    Types: Cigarettes  . Smokeless tobacco: Never Used     Comment: smokes 2 cigarettes three time s week  . Alcohol Use: No    Allergies  Allergen Reactions  . Penicillins     REACTION: rash    Current Outpatient Prescriptions  Medication Sig Dispense Refill  . acetaminophen (TYLENOL) 325 MG tablet Take 650 mg by mouth every 6 (six) hours as needed for headache.    . ALPRAZolam (XANAX) 0.5 MG tablet Take 0.5 mg by mouth 2 (two) times daily.      Marland Kitchen amLODipine (NORVASC) 10 MG tablet Take 10 mg by mouth daily.    Marland Kitchen b complex vitamins capsule Take 1 capsule by mouth daily.    . calcitRIOL (ROCALTROL) 0.25 MCG capsule Take 0.25 mcg by mouth daily. M-W-F    . calcium carbonate (OS-CAL) 600 MG TABS  tablet Take 600 mg by mouth daily with breakfast.    . carvedilol (COREG) 6.25 MG tablet Take 6.25 mg by mouth 2 (two) times daily with a meal.    . cholecalciferol (VITAMIN D) 1000 UNITS tablet Take 1,000 Units by mouth daily.    Marland Kitchen docusate sodium (COLACE) 100 MG capsule Take 100 mg by mouth 2 (two) times daily.    . DULoxetine (CYMBALTA) 60 MG capsule Take 60 mg by mouth daily.      . ferrous fumarate-iron polysaccharide complex (TANDEM) 162-115.2 MG CAPS Take 1 capsule by mouth daily with breakfast.    . furosemide (LASIX) 20 MG tablet Take 20 mg by mouth daily.    Marland Kitchen gabapentin (NEURONTIN) 300 MG capsule Take 300 mg by mouth 3 (three) times daily.    Marland Kitchen glipiZIDE (GLUCOTROL) 5 MG tablet Take 5 mg by mouth 2 (two) times daily before a meal.     . hydrALAZINE (APRESOLINE) 50 MG tablet Take 1 tablet by mouth daily.  3  . insulin glargine (LANTUS) 100 UNIT/ML injection Inject 40 Units into the skin at bedtime.    . insulin lispro protamine-lispro (HUMALOG 75/25 MIX) (75-25) 100 UNIT/ML SUSP injection  Inject 22 Units into the skin 3 (three) times daily before meals.     Marland Kitchen oxyCODONE-acetaminophen (PERCOCET) 10-325 MG per tablet Take 1 tablet by mouth at bedtime. 15 tablet 0  . pantoprazole (PROTONIX) 40 MG tablet Take 40 mg by mouth daily.    . sodium bicarbonate 650 MG tablet Take 650 mg by mouth 3 (three) times daily.    . IRON PO Take 1 tablet by mouth daily. Reported on 12/23/2015     No current facility-administered medications for this visit.    REVIEW OF SYSTEMS:  '[X]'  denotes positive finding, '[ ]'  denotes negative finding Cardiac  Comments:  Chest pain or chest pressure:    Shortness of breath upon exertion:    Short of breath when lying flat:    Irregular heart rhythm:        Vascular    Pain in calf, thigh, or hip brought on by ambulation: x hip  Pain in feet at night that wakes you up from your sleep:     Blood clot in your veins:    Leg swelling:  x       Pulmonary    Oxygen  at home:    Productive cough:     Wheezing:         Neurologic    Sudden weakness in arms or legs:  x   Sudden numbness in arms or legs:     Sudden onset of difficulty speaking or slurred speech:    Temporary loss of vision in one eye:  x   Problems with dizziness:  x       Gastrointestinal    Blood in stool:     Vomited blood:         Genitourinary    Burning when urinating:     Blood in urine:        Psychiatric    Major depression:         Hematologic    Bleeding problems:    Problems with blood clotting too easily:        Skin    Rashes or ulcers:        Constitutional    Fever or chills:      PHYSICAL EXAM: Filed Vitals:   12/23/15 1531 12/23/15 1536  BP: 163/75 164/72  Pulse: 76   Temp: 97.2 F (36.2 C)   TempSrc: Oral   Resp: 18   Height: '5\' 4"'  (1.626 m)   Weight: 161 lb 3.2 oz (73.12 kg)   SpO2: 96%     GENERAL: The patient is a well-nourished female, in no acute distress. The vital signs are documented above. VASCULAR: Palpable right radial pulse. Excellent thrill in her AV fistula. She does have a very tortuous course of her fistula from her wrist to her antecubital space. Has several sidebranches by physical exam PULMONARY: There is good air exchange   MUSCULOSKELETAL: There are no major deformities or cyanosis. NEUROLOGIC: No focal weakness or paresthesias are detected. SKIN: There are no ulcers or rashes noted. PSYCHIATRIC: The patient has a normal affect.   MEDICAL ISSUES: Discuss options with patient. I agree with her nephrologist that this would be very difficult to access for hemodialysis standpoint. She has extreme tortuosity of her cephalic vein in her forearm be difficult to find 2 places to safely access with a needle. I have recommended revision with the mobilization and straightening of her fistula. Explained that this would be through the initial incision at her wrist  and probably 1 at her antecubital space and one in her midforearm  with repeat tunneling of her fistula. She understands wished to proceed as soon as possible    Audreana Hancox [facility] [employer]: [phone]

## 2015-12-29 ENCOUNTER — Other Ambulatory Visit: Payer: Self-pay

## 2016-01-02 ENCOUNTER — Encounter (HOSPITAL_COMMUNITY): Payer: Self-pay | Admitting: *Deleted

## 2016-01-02 NOTE — Progress Notes (Signed)
   01/02/16 1624  OBSTRUCTIVE SLEEP APNEA  Have you ever been diagnosed with sleep apnea through a sleep study? No  Do you snore loudly (loud enough to be heard through closed doors)?  1  Do you often feel tired, fatigued, or sleepy during the daytime (such as falling asleep during driving or talking to someone)? 1  Has anyone observed you stop breathing during your sleep? 1  Do you have, or are you being treated for high blood pressure? 1  BMI more than 35 kg/m2? 0  Age > 78 (1-yes) 1  Female Gender (Yes=1) 0  Obstructive Sleep Apnea Score 5  Score 5 or greater  Results sent to PCP

## 2016-01-02 NOTE — Progress Notes (Signed)
Spoke with pt's sister, Gae Bon for pre-op call. She states pt does not have a cardiac history and does not complain of chest pain. She does have sob at times. Pt is diabetic, fasting blood sugar runs between 130 -300.   Instructed Ramona to have pt check her blood sugar at least 4 times a day for the next 2 days. Instructed her to have pt to take 1/2 of regular Lantus dose Sunday night (will take 20 units) and 1/2 of her Humalog 70/25 insulin Sunday evening dose. Instructed her to have pt check her blood sugar every 2 hours upon waking up until she leaves for the hospital. If blood sugar is 70 or less, pt is to treat with 1/2 cup of clear juice (apple or cranberry). Pt to recheck her blood sugar 15 minutes after drinking juice. If blood sugar is still 70 or less, instructed Ramona to call the Short Stay Unit and ask to speak with a nurse. She voiced understanding. These instructions given per Diabetes Medication Adjustment Guidelines Prior to Procedure and Surgery.  Pt's PCP is Dr. [name] Pt's nephrologist is Dr. Lowanda Foster

## 2016-01-04 MED ORDER — VANCOMYCIN HCL IN DEXTROSE 1-5 GM/200ML-% IV SOLN
1000.0000 mg | INTRAVENOUS | Status: AC
  Administered 2016-01-05: 1000 mg via INTRAVENOUS
  Filled 2016-01-04: qty 200

## 2016-01-04 MED ORDER — SODIUM CHLORIDE 0.9 % IV SOLN
INTRAVENOUS | Status: DC
  Administered 2016-01-05: 10 mL/h via INTRAVENOUS

## 2016-01-05 ENCOUNTER — Encounter (HOSPITAL_COMMUNITY)
Admission: RE | Disposition: A | Discharge status: Home / Self Care | Payer: Self-pay | Source: Ambulatory Visit | Attending: Vascular Surgery

## 2016-01-05 ENCOUNTER — Encounter (HOSPITAL_COMMUNITY): Payer: Self-pay | Admitting: *Deleted

## 2016-01-05 ENCOUNTER — Ambulatory Visit (HOSPITAL_COMMUNITY)
Admission: RE | Admit: 2016-01-05 | Discharge: 2016-01-05 | Disposition: A | Discharge status: Home / Self Care | Payer: Medicare Other | Source: Ambulatory Visit | Attending: Vascular Surgery | Admitting: Vascular Surgery

## 2016-01-05 ENCOUNTER — Ambulatory Visit (HOSPITAL_COMMUNITY): Payer: Medicare Other | Admitting: Certified Registered Nurse Anesthetist

## 2016-01-05 DIAGNOSIS — Z794 Long term (current) use of insulin: Secondary | ICD-10-CM | POA: Diagnosis not present

## 2016-01-05 DIAGNOSIS — F329 Major depressive disorder, single episode, unspecified: Secondary | ICD-10-CM | POA: Diagnosis not present

## 2016-01-05 DIAGNOSIS — K5900 Constipation, unspecified: Secondary | ICD-10-CM | POA: Diagnosis not present

## 2016-01-05 DIAGNOSIS — I868 Varicose veins of other specified sites: Secondary | ICD-10-CM | POA: Diagnosis present

## 2016-01-05 DIAGNOSIS — E1140 Type 2 diabetes mellitus with diabetic neuropathy, unspecified: Secondary | ICD-10-CM | POA: Diagnosis not present

## 2016-01-05 DIAGNOSIS — T82898A Other specified complication of vascular prosthetic devices, implants and grafts, initial encounter: Secondary | ICD-10-CM | POA: Insufficient documentation

## 2016-01-05 DIAGNOSIS — D649 Anemia, unspecified: Secondary | ICD-10-CM | POA: Diagnosis not present

## 2016-01-05 DIAGNOSIS — I120 Hypertensive chronic kidney disease with stage 5 chronic kidney disease or end stage renal disease: Secondary | ICD-10-CM | POA: Insufficient documentation

## 2016-01-05 DIAGNOSIS — K219 Gastro-esophageal reflux disease without esophagitis: Secondary | ICD-10-CM | POA: Insufficient documentation

## 2016-01-05 DIAGNOSIS — Z85828 Personal history of other malignant neoplasm of skin: Secondary | ICD-10-CM | POA: Insufficient documentation

## 2016-01-05 DIAGNOSIS — Z79899 Other long term (current) drug therapy: Secondary | ICD-10-CM | POA: Diagnosis not present

## 2016-01-05 DIAGNOSIS — Z992 Dependence on renal dialysis: Secondary | ICD-10-CM | POA: Diagnosis not present

## 2016-01-05 DIAGNOSIS — F17210 Nicotine dependence, cigarettes, uncomplicated: Secondary | ICD-10-CM | POA: Insufficient documentation

## 2016-01-05 DIAGNOSIS — N186 End stage renal disease: Secondary | ICD-10-CM | POA: Diagnosis not present

## 2016-01-05 DIAGNOSIS — E1122 Type 2 diabetes mellitus with diabetic chronic kidney disease: Secondary | ICD-10-CM | POA: Diagnosis not present

## 2016-01-05 DIAGNOSIS — F419 Anxiety disorder, unspecified: Secondary | ICD-10-CM | POA: Insufficient documentation

## 2016-01-05 DIAGNOSIS — Y832 Surgical operation with anastomosis, bypass or graft as the cause of abnormal reaction of the patient, or of later complication, without mention of misadventure at the time of the procedure: Secondary | ICD-10-CM | POA: Insufficient documentation

## 2016-01-05 HISTORY — DX: Polyneuropathy, unspecified: G62.9

## 2016-01-05 HISTORY — DX: Diverticulitis of intestine, part unspecified, without perforation or abscess without bleeding: K57.92

## 2016-01-05 HISTORY — PX: REVISON OF ARTERIOVENOUS FISTULA: SHX6074

## 2016-01-05 LAB — POCT I-STAT 4, (NA,K, GLUC, HGB,HCT)
GLUCOSE: 106 mg/dL — AB (ref 65–99)
HEMATOCRIT: 25 % — AB (ref 36.0–46.0)
Hemoglobin: 8.5 g/dL — ABNORMAL LOW (ref 12.0–15.0)
POTASSIUM: 4.7 mmol/L (ref 3.5–5.1)
Sodium: 144 mmol/L (ref 135–145)

## 2016-01-05 LAB — GLUCOSE, CAPILLARY: Glucose-Capillary: 134 mg/dL — ABNORMAL HIGH (ref 65–99)

## 2016-01-05 SURGERY — REVISON OF ARTERIOVENOUS FISTULA
Anesthesia: Monitor Anesthesia Care | Site: Arm Lower | Laterality: Right

## 2016-01-05 MED ORDER — SODIUM CHLORIDE 0.9 % IV SOLN
INTRAVENOUS | Status: DC | PRN
  Administered 2016-01-05: 500 mL

## 2016-01-05 MED ORDER — LIDOCAINE HCL (CARDIAC) 20 MG/ML IV SOLN
INTRAVENOUS | Status: DC | PRN
  Administered 2016-01-05: 40 mg via INTRATRACHEAL

## 2016-01-05 MED ORDER — LIDOCAINE-EPINEPHRINE 0.5 %-1:200000 IJ SOLN
INTRAMUSCULAR | Status: DC | PRN
  Administered 2016-01-05: 17 mL via INTRADERMAL

## 2016-01-05 MED ORDER — PROPOFOL 500 MG/50ML IV EMUL
INTRAVENOUS | Status: DC | PRN
  Administered 2016-01-05: 12:00:00 via INTRAVENOUS
  Administered 2016-01-05: 50 ug/kg/min via INTRAVENOUS

## 2016-01-05 MED ORDER — FENTANYL CITRATE (PF) 100 MCG/2ML IJ SOLN
INTRAMUSCULAR | Status: DC | PRN
  Administered 2016-01-05 (×2): 25 ug via INTRAVENOUS
  Administered 2016-01-05: 50 ug via INTRAVENOUS

## 2016-01-05 MED ORDER — FENTANYL CITRATE (PF) 250 MCG/5ML IJ SOLN
INTRAMUSCULAR | Status: AC
  Filled 2016-01-05: qty 5

## 2016-01-05 MED ORDER — ONDANSETRON HCL 4 MG/2ML IJ SOLN
INTRAMUSCULAR | Status: DC | PRN
  Administered 2016-01-05: 4 mg via INTRAVENOUS

## 2016-01-05 MED ORDER — CHLORHEXIDINE GLUCONATE CLOTH 2 % EX PADS: 6.0000 | MEDICATED_PAD | Freq: Once | CUTANEOUS | Status: DC

## 2016-01-05 MED ORDER — MIDAZOLAM HCL 2 MG/2ML IJ SOLN
INTRAMUSCULAR | Status: AC
  Filled 2016-01-05: qty 2

## 2016-01-05 MED ORDER — MIDAZOLAM HCL 5 MG/5ML IJ SOLN
INTRAMUSCULAR | Status: DC | PRN
  Administered 2016-01-05 (×2): 1 mg via INTRAVENOUS

## 2016-01-05 MED ORDER — DEXTROSE 5 % IV SOLN
INTRAVENOUS | Status: DC | PRN
  Administered 2016-01-05: 11:00:00 via INTRAVENOUS

## 2016-01-05 MED ORDER — OXYCODONE-ACETAMINOPHEN 10-325 MG PO TABS: 1.0000 | ORAL_TABLET | Freq: Four times a day (QID) | ORAL | Status: DC | PRN

## 2016-01-05 MED ORDER — PROPOFOL 10 MG/ML IV BOLUS
INTRAVENOUS | Status: DC | PRN
  Administered 2016-01-05: 10 mg via INTRAVENOUS
  Administered 2016-01-05: 20 mg via INTRAVENOUS

## 2016-01-05 MED ORDER — SODIUM CHLORIDE 0.9 % IR SOLN
Status: DC | PRN
  Administered 2016-01-05: 1000 mL

## 2016-01-05 MED ORDER — LIDOCAINE-EPINEPHRINE 0.5 %-1:200000 IJ SOLN
INTRAMUSCULAR | Status: AC
  Filled 2016-01-05: qty 1

## 2016-01-05 SURGICAL SUPPLY — 42 items
APL SKNCLS STERI-STRIP NONHPOA (GAUZE/BANDAGES/DRESSINGS) ×1
BANDAGE ELASTIC 4 VELCRO ST LF (GAUZE/BANDAGES/DRESSINGS) ×2 IMPLANT
BENZOIN TINCTURE PRP APPL 2/3 (GAUZE/BANDAGES/DRESSINGS) ×3 IMPLANT
BNDG GAUZE ELAST 4 BULKY (GAUZE/BANDAGES/DRESSINGS) ×2 IMPLANT
CANISTER SUCTION 2500CC (MISCELLANEOUS) ×3 IMPLANT
CANNULA VESSEL 3MM 2 BLNT TIP (CANNULA) ×2 IMPLANT
CLIP LIGATING EXTRA MED SLVR (CLIP) ×3 IMPLANT
CLIP LIGATING EXTRA SM BLUE (MISCELLANEOUS) ×3 IMPLANT
CLOSURE STERI-STRIP 1/2X4 (GAUZE/BANDAGES/DRESSINGS) ×1
CLOSURE WOUND 1/2 X4 (GAUZE/BANDAGES/DRESSINGS) ×1
CLSR STERI-STRIP ANTIMIC 1/2X4 (GAUZE/BANDAGES/DRESSINGS) ×1 IMPLANT
COVER PROBE W GEL 5X96 (DRAPES) ×1 IMPLANT
DECANTER SPIKE VIAL GLASS SM (MISCELLANEOUS) ×1 IMPLANT
ELECT REM PT RETURN 9FT ADLT (ELECTROSURGICAL) ×3
ELECTRODE REM PT RTRN 9FT ADLT (ELECTROSURGICAL) ×1 IMPLANT
GAUZE SPONGE 4X4 12PLY STRL (GAUZE/BANDAGES/DRESSINGS) ×3 IMPLANT
GEL ULTRASOUND 20GR AQUASONIC (MISCELLANEOUS) IMPLANT
GLOVE BIO SURGEON STRL SZ 6.5 (GLOVE) ×1 IMPLANT
GLOVE BIO SURGEONS STRL SZ 6.5 (GLOVE) ×1
GLOVE BIOGEL PI IND STRL 6.5 (GLOVE) IMPLANT
GLOVE BIOGEL PI IND STRL 7.0 (GLOVE) IMPLANT
GLOVE BIOGEL PI INDICATOR 6.5 (GLOVE) ×4
GLOVE BIOGEL PI INDICATOR 7.0 (GLOVE) ×4
GLOVE SS BIOGEL STRL SZ 7.5 (GLOVE) ×1 IMPLANT
GLOVE SUPERSENSE BIOGEL SZ 7.5 (GLOVE) ×2
GLOVE SURG SS PI 6.5 STRL IVOR (GLOVE) ×4 IMPLANT
GLOVE SURG SS PI 7.0 STRL IVOR (GLOVE) ×3 IMPLANT
GOWN STRL REUS W/ TWL LRG LVL3 (GOWN DISPOSABLE) ×3 IMPLANT
GOWN STRL REUS W/TWL LRG LVL3 (GOWN DISPOSABLE) ×9
KIT BASIN OR (CUSTOM PROCEDURE TRAY) ×3 IMPLANT
KIT ROOM TURNOVER OR (KITS) ×3 IMPLANT
NS IRRIG 1000ML POUR BTL (IV SOLUTION) ×3 IMPLANT
PACK CV ACCESS (CUSTOM PROCEDURE TRAY) ×3 IMPLANT
PAD ARMBOARD 7.5X6 YLW CONV (MISCELLANEOUS) ×6 IMPLANT
SPONGE GAUZE 4X4 12PLY STER LF (GAUZE/BANDAGES/DRESSINGS) ×3 IMPLANT
STRIP CLOSURE SKIN 1/2X4 (GAUZE/BANDAGES/DRESSINGS) ×2 IMPLANT
SUT PROLENE 6 0 CC (SUTURE) ×9 IMPLANT
SUT SILK 2 0 SH (SUTURE) ×2 IMPLANT
SUT VIC AB 3-0 SH 27 (SUTURE) ×6
SUT VIC AB 3-0 SH 27X BRD (SUTURE) ×1 IMPLANT
UNDERPAD 30X30 INCONTINENT (UNDERPADS AND DIAPERS) ×3 IMPLANT
WATER STERILE IRR 1000ML POUR (IV SOLUTION) ×3 IMPLANT

## 2016-01-05 NOTE — Discharge Instructions (Signed)
° ° °  01/05/2016 Tanya Lucero [id number] [birth year]  Surgeon(s): [name], MD  Procedure(s): REVISON OF ARTERIOVENOUS FISTULA- right forearm  x Do not stick graft for 4 weeks

## 2016-01-05 NOTE — Transfer of Care (Signed)
Immediate Anesthesia Transfer of Care Note  Patient: Tanya Lucero  Procedure(s) Performed: Procedure(s): REVISON OF ARTERIOVENOUS FISTULA- right forearm (Right)  Patient Location: PACU  Anesthesia Type:MAC  Level of Consciousness: awake, alert  and oriented  Airway & Oxygen Therapy: Patient Spontanous Breathing  Post-op Assessment: Report given to RN and Post -op Vital signs reviewed and stable  Post vital signs: Reviewed and stable  Last Vitals:  Filed Vitals:   01/05/16 0931  BP: 150/40  Pulse: 70  Temp: 36.7 C  Resp: 20    Last Pain: There were no vitals filed for this visit.    Patients Stated Pain Goal: 8 ([id number])  Complications: No apparent anesthesia complications

## 2016-01-05 NOTE — Op Note (Signed)
    OPERATIVE REPORT  DATE OF SURGERY: 01/05/2016  PATIENT: Tanya Lucero, 65 y.o. female MRN: [id number]  DOB: 12/09/1950  PRE-OPERATIVE DIAGNOSIS: Chronic renal insufficiency with torturous right  Forearm radiocephalic fistula  POST-OPERATIVE DIAGNOSIS:  Same  PROCEDURE: Mobilization and translocation of right cephalic fistula  SURGEON:  Curt Jews, M.D.  PHYSICIAN ASSISTANT: Avel Peace PA-C  ANESTHESIA:  Local with sedation  EBL: Minimal ml  Total I/O In: 450 [I.V.:450] Out: 25 [Blood:25]  BLOOD ADMINISTERED: None  DRAINS: None  SPECIMEN: None  COUNTS CORRECT:  YES  PLAN OF CARE: PACU   PATIENT DISPOSITION:  PACU - hemodynamically stable  PROCEDURE DETAILS: Patient was taken to the operating placed supine position where the area of the right arm was prepped and draped in usual sterile fashion. Incision was made using local anesthesia over the prior radiocephalic anastomosis. This was carried down by 6 cephalic vein at the radial anastomosis. SonoSite ultrasound was used for visualization of the fistula. It was a very good caliber throughout its course. The outlet was flow was into the basilic vein at the antecubital space. Separate incision was made using local anesthesia the antecubital space and carried down to isolate the fistula. Tributary branches were ligated. The cephalic vein outflow at the antecubital space was small and this was ligated and divided as well. Outflow was strictly into the basilic vein. A third incision was then made in the midforearm over the fistula. The fistula was mobilized in its entirety from the wrist to the antecubital space.  There were several large branches in the distal forearm. These were ligated with 3-0 silk ties and divided. The cephalic vein was occluded with a vascular clamp near the radial artery anastomosis was also occluded at the cubital space. The cephalic vein was transected and was brought out through the tunnel the vein had  been marked to prevent twisting. The vein was gently dilated was of excellent size. A new tunnel was created on the radial aspect of the forearm level of the incision at the wrist to the level of the antecubital incision. The vein was brought back through this tunnel back to the level of the radial artery to wrist. The vein was slightly shortened. The vein was spatulated and was sewn into into itself near the radial artery anastomosis with a running 6-0 Prolene suture. Clamps were removed and excellent thrill was noted. The wound irrigated with saline. Hemostasis tablet cautery. Wounds were closed with 3-0 Vicryl in the subcutaneous and subcuticular tissue. Sterile dressing was applied   Curt Jews, M.D. 01/05/2016 2:17 PM

## 2016-01-05 NOTE — Anesthesia Postprocedure Evaluation (Signed)
Anesthesia Post Note  Patient: Tanya Lucero  Procedure(s) Performed: Procedure(s) (LRB): REVISON OF ARTERIOVENOUS FISTULA- right forearm (Right)  Patient location during evaluation: PACU Anesthesia Type: MAC Level of consciousness: awake and alert Pain management: pain level controlled Vital Signs Assessment: post-procedure vital signs reviewed and stable Respiratory status: spontaneous breathing Cardiovascular status: stable Anesthetic complications: no    Last Vitals:  Filed Vitals:   01/05/16 1245 01/05/16 1300  BP:  155/56  Pulse: 66 68  Temp:  36.1 C  Resp: 14 14    Last Pain: There were no vitals filed for this visit.               Nolon Nations

## 2016-01-05 NOTE — H&P (View-Only) (Signed)
[facility]   Patient name: Tanya Lucero MRN: [id number] DOB: 1950-10-31 Sex: female  REASON FOR VISIT: Evaluation of right forearm AV fistula  HPI: Tanya Lucero is a 65 y.o. female seen today for evaluation of her right forearm AV fistula. This was created by myself on 09/13/2014 she is still not on hemodialysis. Her renal insufficiency has been progressive and her nephrologist is asked that we see her to determine if revision is necessary of her fistula. She has no steal symptoms.  Past Medical History  Diagnosis Date  . Hypertension   . GERD (gastroesophageal reflux disease)   . Anxiety   . Depression   . Chronic kidney disease     neuphrotic syndrome-05/2013  . Arthritis   . Gout   . Anemia   . Diabetes mellitus     Type 2  . Shortness of breath dyspnea   . Headache   . Diabetic neuropathy ([place])   . Constipation   . Dry skin   . Skin cancer   . Decreased vision     left eye    Family History  Problem Relation Age of Onset  . Multiple myeloma Mother   . Diabetes Mother   . Heart attack Father 80    SOCIAL HISTORY: Social History  Substance Use Topics  . Smoking status: Current Every Day Smoker -- 0.25 packs/day for 40 years    Types: Cigarettes  . Smokeless tobacco: Never Used     Comment: smokes 2 cigarettes three time s week  . Alcohol Use: No    Allergies  Allergen Reactions  . Penicillins     REACTION: rash    Current Outpatient Prescriptions  Medication Sig Dispense Refill  . acetaminophen (TYLENOL) 325 MG tablet Take 650 mg by mouth every 6 (six) hours as needed for headache.    . ALPRAZolam (XANAX) 0.5 MG tablet Take 0.5 mg by mouth 2 (two) times daily.      Marland Kitchen amLODipine (NORVASC) 10 MG tablet Take 10 mg by mouth daily.    Marland Kitchen b complex vitamins capsule Take 1 capsule by mouth daily.    . calcitRIOL (ROCALTROL) 0.25 MCG capsule Take 0.25 mcg by mouth daily. M-W-F    . calcium carbonate (OS-CAL) 600 MG TABS  tablet Take 600 mg by mouth daily with breakfast.    . carvedilol (COREG) 6.25 MG tablet Take 6.25 mg by mouth 2 (two) times daily with a meal.    . cholecalciferol (VITAMIN D) 1000 UNITS tablet Take 1,000 Units by mouth daily.    Marland Kitchen docusate sodium (COLACE) 100 MG capsule Take 100 mg by mouth 2 (two) times daily.    . DULoxetine (CYMBALTA) 60 MG capsule Take 60 mg by mouth daily.      . ferrous fumarate-iron polysaccharide complex (TANDEM) 162-115.2 MG CAPS Take 1 capsule by mouth daily with breakfast.    . furosemide (LASIX) 20 MG tablet Take 20 mg by mouth daily.    Marland Kitchen gabapentin (NEURONTIN) 300 MG capsule Take 300 mg by mouth 3 (three) times daily.    Marland Kitchen glipiZIDE (GLUCOTROL) 5 MG tablet Take 5 mg by mouth 2 (two) times daily before a meal.     . hydrALAZINE (APRESOLINE) 50 MG tablet Take 1 tablet by mouth daily.  3  . insulin glargine (LANTUS) 100 UNIT/ML injection Inject 40 Units into the skin at bedtime.    . insulin lispro protamine-lispro (HUMALOG 75/25 MIX) (75-25) 100 UNIT/ML SUSP injection  Inject 22 Units into the skin 3 (three) times daily before meals.     Marland Kitchen oxyCODONE-acetaminophen (PERCOCET) 10-325 MG per tablet Take 1 tablet by mouth at bedtime. 15 tablet 0  . pantoprazole (PROTONIX) 40 MG tablet Take 40 mg by mouth daily.    . sodium bicarbonate 650 MG tablet Take 650 mg by mouth 3 (three) times daily.    . IRON PO Take 1 tablet by mouth daily. Reported on 12/23/2015     No current facility-administered medications for this visit.    REVIEW OF SYSTEMS:  '[X]'  denotes positive finding, '[ ]'  denotes negative finding Cardiac  Comments:  Chest pain or chest pressure:    Shortness of breath upon exertion:    Short of breath when lying flat:    Irregular heart rhythm:        Vascular    Pain in calf, thigh, or hip brought on by ambulation: x hip  Pain in feet at night that wakes you up from your sleep:     Blood clot in your veins:    Leg swelling:  x       Pulmonary    Oxygen  at home:    Productive cough:     Wheezing:         Neurologic    Sudden weakness in arms or legs:  x   Sudden numbness in arms or legs:     Sudden onset of difficulty speaking or slurred speech:    Temporary loss of vision in one eye:  x   Problems with dizziness:  x       Gastrointestinal    Blood in stool:     Vomited blood:         Genitourinary    Burning when urinating:     Blood in urine:        Psychiatric    Major depression:         Hematologic    Bleeding problems:    Problems with blood clotting too easily:        Skin    Rashes or ulcers:        Constitutional    Fever or chills:      PHYSICAL EXAM: Filed Vitals:   12/23/15 1531 12/23/15 1536  BP: 163/75 164/72  Pulse: 76   Temp: 97.2 F (36.2 C)   TempSrc: Oral   Resp: 18   Height: '5\' 4"'  (1.626 m)   Weight: 161 lb 3.2 oz (73.12 kg)   SpO2: 96%     GENERAL: The patient is a well-nourished female, in no acute distress. The vital signs are documented above. VASCULAR: Palpable right radial pulse. Excellent thrill in her AV fistula. She does have a very tortuous course of her fistula from her wrist to her antecubital space. Has several sidebranches by physical exam PULMONARY: There is good air exchange   MUSCULOSKELETAL: There are no major deformities or cyanosis. NEUROLOGIC: No focal weakness or paresthesias are detected. SKIN: There are no ulcers or rashes noted. PSYCHIATRIC: The patient has a normal affect.   MEDICAL ISSUES: Discuss options with patient. I agree with her nephrologist that this would be very difficult to access for hemodialysis standpoint. She has extreme tortuosity of her cephalic vein in her forearm be difficult to find 2 places to safely access with a needle. I have recommended revision with the mobilization and straightening of her fistula. Explained that this would be through the initial incision at her wrist  and probably 1 at her antecubital space and one in her midforearm  with repeat tunneling of her fistula. She understands wished to proceed as soon as possible    Elester Apodaca [facility] [employer]: [phone]

## 2016-01-05 NOTE — Interval H&P Note (Signed)
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 After consideration of risks, benefits and other options for treatment, the patient has consented to  Procedure(s): REVISON OF ARTERIOVENOUS FISTULA- right forearm (Right) as a surgical intervention .  The patient's history has been reviewed, patient examined, no change in status, stable for surgery.  I have reviewed the patient's chart and labs.  Questions were answered to the patient's satisfaction.     Curt Jews

## 2016-01-05 NOTE — Anesthesia Preprocedure Evaluation (Signed)
Anesthesia Evaluation  Patient identified by MRN, date of birth, ID band Patient awake    Reviewed: Allergy & Precautions, NPO status , Patient's Chart, lab work & pertinent test results, reviewed documented beta blocker date and time   Airway Mallampati: II       Dental  (+) Poor Dentition, Dental Advidsory Given   Pulmonary shortness of breath and with exertion, Current Smoker, former smoker,    breath sounds clear to auscultation       Cardiovascular hypertension, Pt. on home beta blockers and Pt. on medications  Rhythm:Regular Rate:Normal     Neuro/Psych  Headaches, PSYCHIATRIC DISORDERS Anxiety Depression    GI/Hepatic GERD  Poorly Controlled and Medicated,  Endo/Other  diabetes  Renal/GU ESRFRenal disease     Musculoskeletal  (+) Arthritis ,   Abdominal   Peds  Hematology  (+) anemia ,   Anesthesia Other Findings   Reproductive/Obstetrics                             Anesthesia Physical  Anesthesia Plan  ASA: III  Anesthesia Plan: MAC   Post-op Pain Management:    Induction: Intravenous  Airway Management Planned: Natural Airway and Simple Face Mask  Additional Equipment:   Intra-op Plan:   Post-operative Plan:   Informed Consent: I have reviewed the patients History and Physical, chart, labs and discussed the procedure including the risks, benefits and alternatives for the proposed anesthesia with the patient or authorized representative who has indicated his/her understanding and acceptance.   Dental advisory given  Plan Discussed with: CRNA  Anesthesia Plan Comments:         Anesthesia Quick Evaluation

## 2016-01-06 ENCOUNTER — Telehealth: Payer: Self-pay | Admitting: Vascular Surgery

## 2016-01-06 ENCOUNTER — Encounter (HOSPITAL_COMMUNITY): Payer: Self-pay | Admitting: Vascular Surgery

## 2016-01-06 NOTE — Telephone Encounter (Signed)
-----   Message from Mena Goes, RN sent at 01/05/2016  2:16 PM EDT ----- Regarding: schedule   ----- Message -----    From: Alvia Grove, PA-C    Sent: 01/05/2016  11:54 AM      To: [facility] Charge Pool  S/p transposition of tortuous right forearm fistula 01/05/16  F/u with Dr. Donnetta Hutching in 4 weeks. No studies.  Thanks Maudie Mercury

## 2016-01-06 NOTE — Telephone Encounter (Signed)
sched appt 02/03/16 at 8:45. Hm# had no vm, spoke to pt's sister to inform pt of appt.

## 2016-01-28 ENCOUNTER — Encounter: Payer: Self-pay | Admitting: Vascular Surgery

## 2016-02-03 ENCOUNTER — Encounter: Payer: Self-pay | Admitting: Vascular Surgery

## 2016-02-03 ENCOUNTER — Ambulatory Visit (INDEPENDENT_AMBULATORY_CARE_PROVIDER_SITE_OTHER): Payer: Medicare Other | Admitting: Vascular Surgery

## 2016-02-03 VITALS — BP 158/80 | HR 74 | Ht 64.0 in | Wt 185.4 lb

## 2016-02-03 DIAGNOSIS — N184 Chronic kidney disease, stage 4 (severe): Secondary | ICD-10-CM

## 2016-02-03 NOTE — Progress Notes (Signed)
Patient name: Tanya Lucero MRN: [id number] DOB: 1951-07-13 Sex: female  REASON FOR VISIT: Follow-up of revision of right arm AV fistula on 01/05/2016.  HPI: Tanya Lucero is a 65 y.o. female today for follow-up of revision of her right forearm radiocephalic fistula. She had the nice size maturation but the vein was extremely tortuous and deep under the fat. She had the complete mobilization from the antecubital space to the wrist and then had the vein tunneled more laterally and reanastomosed. She has had no difficulty regarding her surgery. She was hospitalized with syncopal episode and continues to have some deterioration from an overall medical standpoint. He is here today with her daughter.  Current Outpatient Prescriptions  Medication Sig Dispense Refill  . acetaminophen (TYLENOL) 325 MG tablet Take 650 mg by mouth every 6 (six) hours as needed for headache.    Marland Kitchen amLODipine (NORVASC) 10 MG tablet Take 10 mg by mouth daily.    Marland Kitchen b complex vitamins capsule Take 1 capsule by mouth daily.    . calcitRIOL (ROCALTROL) 0.25 MCG capsule Take 0.25 mcg by mouth daily. M-W-F    . calcium carbonate (OS-CAL) 600 MG TABS tablet Take 600 mg by mouth daily with breakfast.    . carvedilol (COREG) 6.25 MG tablet Take 6.25 mg by mouth 2 (two) times daily with a meal.    . docusate sodium (COLACE) 100 MG capsule Take 100 mg by mouth 2 (two) times daily.    . ferrous fumarate-iron polysaccharide complex (TANDEM) 162-115.2 MG CAPS Take 1 capsule by mouth daily with breakfast.    . Ferrous Sulfate (FEROSUL PO) Take by mouth.    . furosemide (LASIX) 20 MG tablet Take 20 mg by mouth every evening.     . furosemide (LASIX) 40 MG tablet Take 40 mg by mouth every morning.    . gabapentin (NEURONTIN) 300 MG capsule Take 300 mg by mouth 3 (three) times daily.    Marland Kitchen glipiZIDE (GLUCOTROL) 5 MG tablet Take 5 mg by mouth 2 (two) times daily before a meal.     . hydrALAZINE (APRESOLINE) 50 MG tablet Take 1 tablet by  mouth 3 (three) times daily.   3  . insulin glargine (LANTUS) 100 UNIT/ML injection Inject 40 Units into the skin at bedtime.    . insulin lispro protamine-lispro (HUMALOG 75/25 MIX) (75-25) 100 UNIT/ML SUSP injection Inject 22 Units into the skin 3 (three) times daily before meals.     . IRON PO Take 1 tablet by mouth daily. Reported on 12/23/2015    . oxyCODONE-acetaminophen (PERCOCET) 10-325 MG tablet Take 1 tablet by mouth every 6 (six) hours as needed for pain. 15 tablet 0  . pantoprazole (PROTONIX) 40 MG tablet Take 40 mg by mouth every morning.     . sodium bicarbonate 650 MG tablet Take 1,300 mg by mouth 3 (three) times daily.     Marland Kitchen ALPRAZolam (XANAX) 0.5 MG tablet Take 0.5 mg by mouth 2 (two) times daily. Reported on 02/03/2016    . DULoxetine (CYMBALTA) 60 MG capsule Take 60 mg by mouth daily. Reported on 02/03/2016     No current facility-administered medications for this visit.      PHYSICAL EXAM: Filed Vitals:   02/03/16 0838 02/03/16 0840  BP: 168/78 158/80  Pulse: 74   Height: 5\' 4"  (1.626 m)   Weight: 185 lb 6.4 oz (84.097 kg)   SpO2: 98%     GENERAL: The patient is a well-nourished female, in no  acute distress. The vital signs are documented above. Her surgical incisions are all well-healed and her right forearm. Has excellent maturation in size of her fistula which runs extremely superficial under the skin and I does have some tortuosity near the wrist but is straight throughout her forearm*  MEDICAL ISSUES: Stable following surgical revision of her AV fistula. She is now one month out from the revision. Can use her right arm AV fistula for hemodialysis is any point. She barely is approaching the need for hemodialysis. Will follow-up with [place] on an as-needed basis  Ladon Heney [facility] [employer] [phone]

## 2016-02-26 ENCOUNTER — Encounter (INDEPENDENT_AMBULATORY_CARE_PROVIDER_SITE_OTHER): Payer: Self-pay | Admitting: *Deleted

## 2016-02-26 ENCOUNTER — Encounter (INDEPENDENT_AMBULATORY_CARE_PROVIDER_SITE_OTHER): Payer: Self-pay

## 2016-02-26 ENCOUNTER — Other Ambulatory Visit (INDEPENDENT_AMBULATORY_CARE_PROVIDER_SITE_OTHER): Payer: Self-pay | Admitting: *Deleted

## 2016-02-26 DIAGNOSIS — D509 Iron deficiency anemia, unspecified: Secondary | ICD-10-CM

## 2016-03-11 ENCOUNTER — Encounter (HOSPITAL_COMMUNITY): Payer: Self-pay

## 2016-03-11 ENCOUNTER — Ambulatory Visit (HOSPITAL_COMMUNITY)
Admission: RE | Admit: 2016-03-11 | Discharge: 2016-03-11 | Disposition: A | Discharge status: Home / Self Care | Payer: Medicare Other | Source: Ambulatory Visit | Attending: Internal Medicine | Admitting: Internal Medicine

## 2016-03-11 ENCOUNTER — Encounter (HOSPITAL_COMMUNITY)
Admission: RE | Disposition: A | Discharge status: Home / Self Care | Payer: Self-pay | Source: Ambulatory Visit | Attending: Internal Medicine

## 2016-03-11 ENCOUNTER — Ambulatory Visit (HOSPITAL_COMMUNITY): Admit: 2016-03-11 | Payer: Medicare Other | Admitting: Internal Medicine

## 2016-03-11 DIAGNOSIS — D509 Iron deficiency anemia, unspecified: Secondary | ICD-10-CM

## 2016-03-11 DIAGNOSIS — M1990 Unspecified osteoarthritis, unspecified site: Secondary | ICD-10-CM | POA: Insufficient documentation

## 2016-03-11 DIAGNOSIS — Z90710 Acquired absence of both cervix and uterus: Secondary | ICD-10-CM | POA: Diagnosis not present

## 2016-03-11 DIAGNOSIS — E1140 Type 2 diabetes mellitus with diabetic neuropathy, unspecified: Secondary | ICD-10-CM | POA: Diagnosis not present

## 2016-03-11 DIAGNOSIS — D500 Iron deficiency anemia secondary to blood loss (chronic): Secondary | ICD-10-CM | POA: Diagnosis not present

## 2016-03-11 DIAGNOSIS — Z85828 Personal history of other malignant neoplasm of skin: Secondary | ICD-10-CM | POA: Insufficient documentation

## 2016-03-11 DIAGNOSIS — M109 Gout, unspecified: Secondary | ICD-10-CM | POA: Insufficient documentation

## 2016-03-11 DIAGNOSIS — Z79899 Other long term (current) drug therapy: Secondary | ICD-10-CM | POA: Insufficient documentation

## 2016-03-11 DIAGNOSIS — R195 Other fecal abnormalities: Secondary | ICD-10-CM

## 2016-03-11 DIAGNOSIS — N189 Chronic kidney disease, unspecified: Secondary | ICD-10-CM | POA: Diagnosis not present

## 2016-03-11 DIAGNOSIS — I129 Hypertensive chronic kidney disease with stage 1 through stage 4 chronic kidney disease, or unspecified chronic kidney disease: Secondary | ICD-10-CM | POA: Insufficient documentation

## 2016-03-11 DIAGNOSIS — F419 Anxiety disorder, unspecified: Secondary | ICD-10-CM | POA: Insufficient documentation

## 2016-03-11 DIAGNOSIS — K317 Polyp of stomach and duodenum: Secondary | ICD-10-CM | POA: Insufficient documentation

## 2016-03-11 DIAGNOSIS — K3189 Other diseases of stomach and duodenum: Secondary | ICD-10-CM | POA: Diagnosis not present

## 2016-03-11 DIAGNOSIS — Z880 Allergy status to penicillin: Secondary | ICD-10-CM | POA: Diagnosis not present

## 2016-03-11 DIAGNOSIS — F329 Major depressive disorder, single episode, unspecified: Secondary | ICD-10-CM | POA: Insufficient documentation

## 2016-03-11 DIAGNOSIS — K219 Gastro-esophageal reflux disease without esophagitis: Secondary | ICD-10-CM | POA: Diagnosis not present

## 2016-03-11 DIAGNOSIS — Z794 Long term (current) use of insulin: Secondary | ICD-10-CM | POA: Diagnosis not present

## 2016-03-11 HISTORY — PX: ESOPHAGOGASTRODUODENOSCOPY: SHX5428

## 2016-03-11 LAB — GLUCOSE, CAPILLARY: GLUCOSE-CAPILLARY: 116 mg/dL — AB (ref 65–99)

## 2016-03-11 LAB — HEMOGLOBIN AND HEMATOCRIT, BLOOD
HEMATOCRIT: 25.8 % — AB (ref 36.0–46.0)
Hemoglobin: 8.6 g/dL — ABNORMAL LOW (ref 12.0–15.0)

## 2016-03-11 SURGERY — EGD (ESOPHAGOGASTRODUODENOSCOPY)
Anesthesia: Moderate Sedation

## 2016-03-11 SURGERY — DILATION, ESOPHAGUS
Anesthesia: Moderate Sedation

## 2016-03-11 MED ORDER — MEPERIDINE HCL 50 MG/ML IJ SOLN
INTRAMUSCULAR | Status: AC
  Filled 2016-03-11: qty 1

## 2016-03-11 MED ORDER — MEPERIDINE HCL 50 MG/ML IJ SOLN
INTRAMUSCULAR | Status: DC | PRN
  Administered 2016-03-11 (×2): 25 mg via INTRAVENOUS

## 2016-03-11 MED ORDER — MIDAZOLAM HCL 5 MG/5ML IJ SOLN
INTRAMUSCULAR | Status: DC | PRN
  Administered 2016-03-11 (×2): 2 mg via INTRAVENOUS

## 2016-03-11 MED ORDER — BUTAMBEN-TETRACAINE-BENZOCAINE 2-2-14 % EX AERO
INHALATION_SPRAY | CUTANEOUS | Status: DC | PRN
  Administered 2016-03-11: 2 via TOPICAL

## 2016-03-11 MED ORDER — ENALAPRILAT 1.25 MG/ML IV SOLN: 1.2500 mg | Freq: Once | INTRAVENOUS | Status: DC

## 2016-03-11 MED ORDER — ENALAPRILAT 1.25 MG/ML IV SOLN
INTRAVENOUS | Status: AC
  Filled 2016-03-11: qty 2

## 2016-03-11 MED ORDER — MIDAZOLAM HCL 5 MG/5ML IJ SOLN
INTRAMUSCULAR | Status: AC
  Filled 2016-03-11: qty 10

## 2016-03-11 MED ORDER — ENALAPRILAT 1.25 MG/ML IV SOLN
INTRAVENOUS | Status: DC | PRN
  Administered 2016-03-11: 1.25 mg via INTRAVENOUS

## 2016-03-11 MED ORDER — SODIUM CHLORIDE 0.9 % IV SOLN
INTRAVENOUS | Status: DC
  Administered 2016-03-11: 12:00:00 via INTRAVENOUS

## 2016-03-11 MED ORDER — SODIUM CHLORIDE 0.9 % IV SOLN: INTRAVENOUS | Status: DC

## 2016-03-11 NOTE — Discharge Instructions (Signed)
Discontinue ferrous sulfate but resume other medications as before. Resume usual diet. No driving for 24 hours. Physician will call with results of biopsy. Gastrointestinal Endoscopy, Care After Refer to this sheet in the next few weeks. These instructions provide you with information on caring for yourself after your procedure. Your caregiver may also give you more specific instructions. Your treatment has been planned according to current medical practices, but problems sometimes occur. Call your caregiver if you have any problems or questions after your procedure. HOME CARE INSTRUCTIONS  If you were given medicine to help you relax (sedative), do not drive, operate machinery, or sign important documents for 24 hours.  Avoid alcohol and hot or warm beverages for the first 24 hours after the procedure.  Only take over-the-counter or prescription medicines for pain, discomfort, or fever as directed by your caregiver. You may resume taking your normal medicines unless your caregiver tells you otherwise. Ask your caregiver when you may resume taking medicines that may cause bleeding, such as aspirin, clopidogrel, or warfarin.  You may return to your normal diet and activities on the day after your procedure, or as directed by your caregiver. Walking may help to reduce any bloated feeling in your abdomen.  Drink enough fluids to keep your urine clear or pale yellow.  You may gargle with salt water if you have a sore throat. SEEK IMMEDIATE MEDICAL CARE IF:  You have severe nausea or vomiting.  You have severe abdominal pain, abdominal cramps that last longer than 6 hours, or abdominal swelling (distention).  You have severe shoulder or back pain.  You have trouble swallowing.  You have shortness of breath, your breathing is shallow, or you are breathing faster than normal.  You have a fever or a rapid heartbeat.  You vomit blood or material that looks like coffee grounds.  You have  bloody, black, or tarry stools. MAKE SURE YOU:  Understand these instructions.  Will watch your condition.  Will get help right away if you are not doing well or get worse.   This information is not intended to replace advice given to you by your health care provider. Make sure you discuss any questions you have with your health care provider.   Document Released: 04/06/2004 Document Revised: 09/13/2014 Document Reviewed: 11/23/2011 Elsevier Interactive Patient Education [employer].

## 2016-03-11 NOTE — Op Note (Signed)
[facility] Patient Name: Tanya Lucero Procedure Date: 03/11/2016 12:35 PM MRN: [id number] Date of Birth: 1950/11/18 Attending MD: Hildred Laser , MD CSN: [id number] Age: 65 Admit Type: Outpatient Procedure:                Upper GI endoscopy Indications:              Iron deficiency anemia secondary to chronic blood                            loss, Heme positive stool Providers:                Hildred Laser, MD, Lurline Del, RN, Isabella Stalling,                            Technician Referring MD:             Peri Maris, MD Medicines:                Cetacaine spray, Meperidine 50 mg IV, Midazolam 4                            mg IV, enalapril [id number] mg IV Complications:            No immediate complications. Estimated Blood Loss:     Estimated blood loss was minimal. Procedure:                Pre-Anesthesia Assessment:                           - Prior to the procedure, a History and Physical                            was performed, and patient medications and                            allergies were reviewed. The patient's tolerance of                            previous anesthesia was also reviewed. The risks                            and benefits of the procedure and the sedation                            options and risks were discussed with the patient.                            All questions were answered, and informed consent                            was obtained. Prior Anticoagulants: The patient has                            taken no previous anticoagulant or antiplatelet  agents. ASA Grade Assessment: III - A patient with                            severe systemic disease. After reviewing the risks                            and benefits, the patient was deemed in                            satisfactory condition to undergo the procedure.                           After obtaining informed consent, the endoscope was           passed under direct vision. Throughout the                            procedure, the patient's blood pressure, pulse, and                            oxygen saturations were monitored continuously. The                            [id number]) scope was introduced through the                            mouth, and advanced to the second part of duodenum.                            The upper GI endoscopy was accomplished without                            difficulty. The patient tolerated the procedure                            well. Scope In: 12:59:27 PM Scope Out: 1:14:07 PM Total Procedure Duration: 0 hours 14 minutes 40 seconds  Findings:      The examined esophagus was normal.      The Z-line was regular and was found 40 cm from the incisors.      Linear inear streaks of nodular mucosa was found in the gastric antrum.       Biopsies were taken with a cold forceps for histology.      The exam of the stomach was otherwise normal.      The duodenal bulb was normal.      Mucosal changes characterized by diffuse pigmentation were found in the       second portion of the duodenum. Biopsies were taken with a cold forceps       for histology. Impression:               - Normal esophagus.                           - Z-line regular, 40 cm from the incisors.                           -  Nodular mucosa in the gastric antrum. 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 Biopsied.                           - Normal duodenal bulb.                           - Mucosal pigmentation in the duodenum. Biopsied. Moderate Sedation:      Moderate (conscious) sedation was administered by the endoscopy nurse       and supervised by the endoscopist. The following parameters were       monitored: oxygen saturation, heart rate, blood pressure, CO2       capnography and response to care. Total physician intraservice time was       22 minutes. Recommendation:           -  Patient has a contact number available for                            emergencies. The signs and symptoms of potential                            delayed complications were discussed with the                            patient. Return to normal activities tomorrow.                            Written discharge instructions were provided to the                            patient.                           - Resume previous diet today.                           - Continue present medications.                           - discontinue ferrous sulfate.                           - Await pathology results.                           - Check hemoglobin today. hemoglobin is 8.6 g today Procedure Code(s):        --- Professional ---                           [phone], Esophagogastroduodenoscopy, flexible,                            transoral; with biopsy, single or multiple  [id number], Moderate sedation services provided by the                            same physician or other qualified health care                            professional performing the diagnostic or                            therapeutic service that the sedation supports,                            requiring the presence of an independent trained                            observer to assist in the monitoring of the                            patient's level of consciousness and physiological                            status; initial 15 minutes of intraservice time,                            patient age 11 years or older Diagnosis Code(s):        --- Professional ---                           K31.89, Other diseases of stomach and duodenum                           D50.0, Iron deficiency anemia secondary to blood                            loss (chronic)                           R19.5, Other fecal abnormalities CPT copyright 2016 American Medical Association. All rights reserved. The codes documented in this  report are preliminary and upon coder review may  be revised to meet current compliance requirements. Hildred Laser, MD Hildred Laser, MD 03/11/2016 1:29:04 PM This report has been signed electronically. Number of Addenda: 0

## 2016-03-11 NOTE — H&P (Signed)
Tanya Lucero is an 65 y.o. female.   Chief Complaint: Patient is here for EGD. HPI: [name] is 65 year old Caucasian female with multiple medical problems including chronic kidney disease with GI of around 10% who has anemia and heme positive stool. She underwent colonoscopy in April this year with removal of 4 polyps in 2 over tubular adenomas. One point she was felt to have iron deficiency anemia but she has not responded to by mouth iron. She was briefly hospitalized at [facility] about 6 weeks ago and received 2 units of PRBCs. Her hemoglobin is dropping again. She is therefore undergoing diagnostic EGD. She has chronic GERD. She feels heartburn generally is well controlled with PPI.  Past Medical History  Diagnosis Date  . Hypertension   . GERD (gastroesophageal reflux disease)   . Anxiety   . Depression   . Chronic kidney disease     neuphrotic syndrome-05/2013  . Arthritis   . Gout   . Anemia   . Diabetes mellitus     Type 2  . Shortness of breath dyspnea   . Headache   . Diabetic neuropathy ([place])   . Constipation   . Dry skin   . Skin cancer   . Decreased vision     left eye  . Neuropathy ([place])   . Diverticulitis     Past Surgical History  Procedure Laterality Date  . Foot surgery Right     x2-heel spur   . Skin cancer excision      forehead  . Abdominal hysterectomy    . Tonsillectomy    . Adenoidectomy    . Cataract extraction w/phaco Left 07/02/2013    Procedure: CATARACT EXTRACTION PHACO AND INTRAOCULAR LENS PLACEMENT (IOC);  Surgeon: [name], MD;  Location: [facility];  Service: Ophthalmology;  Laterality: Left;  CDE:  6.46  . Cataract extraction w/phaco Right 07/12/2013    Procedure: CATARACT EXTRACTION PHACO AND INTRAOCULAR LENS PLACEMENT (IOC);  Surgeon: [name], MD;  Location: [facility];  Service: Ophthalmology;  Laterality: Right;  CDE:12.48  . Back surgery      lumbar -   . Appendectomy      taken out with hysterectomy  . Av fistula placement Right 09/13/2014     Procedure: Creation of Right Arm RADIOCEPHALIC ARTERIOVENOUS (AV) FISTULA ;  Surgeon: [name], MD;  Location: [facility];  Service: Vascular;  Laterality: Right;  . Colonoscopy N/A 12/11/2015    Procedure: COLONOSCOPY;  Surgeon: [name], MD;  [facility]: [facility];  Service: Endoscopy;  Laterality: N/A;  1:55  . Revison of arteriovenous fistula Right 01/05/2016    Procedure: REVISON OF ARTERIOVENOUS FISTULA- right forearm;  Surgeon: [name], MD;  [facility];  Service: Vascular;  Laterality: Right;    Family History  Problem Relation Age of Onset  . Multiple myeloma Mother   . Diabetes Mother   . Heart attack Father 13   Social History:  reports that she has been smoking Cigarettes.  She has a 10 pack-year smoking history. She has never used smokeless tobacco. She reports that she does not drink alcohol or use illicit drugs.  Allergies:  Allergies  Allergen Reactions  . Penicillins Rash    Medications Prior to Admission  Medication Sig Dispense Refill  . acetaminophen (TYLENOL) 325 MG tablet Take 650 mg by mouth every 6 (six) hours as needed for headache.    . ALPRAZolam (XANAX) 0.5 MG tablet Take 0.5 mg by mouth 2 (two)  times daily. Reported on 02/03/2016    . amLODipine (NORVASC) 10 MG tablet Take 10 mg by mouth daily.    Marland Kitchen b complex vitamins capsule Take 1 capsule by mouth daily.    . calcitRIOL (ROCALTROL) 0.25 MCG capsule Take 0.25 mcg by mouth daily. M-W-F    . calcium carbonate (OS-CAL) 600 MG TABS tablet Take 600 mg by mouth daily with breakfast.    . carvedilol (COREG) 6.25 MG tablet Take 6.25 mg by mouth 2 (two) times daily with a meal.    . docusate sodium (COLACE) 100 MG capsule Take 100 mg by mouth 2 (two) times daily.    . DULoxetine (CYMBALTA) 60 MG capsule Take 60 mg by mouth daily. Reported on 02/03/2016    . ferrous fumarate-iron polysaccharide complex (TANDEM) 162-115.2 MG CAPS Take 1 capsule by mouth daily with breakfast.    . Ferrous Sulfate  (FEROSUL PO) Take by mouth.    . furosemide (LASIX) 20 MG tablet Take 20 mg by mouth every evening.     . furosemide (LASIX) 40 MG tablet Take 40 mg by mouth every morning.    . gabapentin (NEURONTIN) 300 MG capsule Take 300 mg by mouth 3 (three) times daily.    Marland Kitchen glipiZIDE (GLUCOTROL) 5 MG tablet Take 5 mg by mouth 2 (two) times daily before a meal.     . hydrALAZINE (APRESOLINE) 50 MG tablet Take 1 tablet by mouth 3 (three) times daily.   3  . insulin glargine (LANTUS) 100 UNIT/ML injection Inject 65 Units into the skin at bedtime.     . insulin lispro protamine-lispro (HUMALOG 75/25 MIX) (75-25) 100 UNIT/ML SUSP injection Inject 22 Units into the skin 3 (three) times daily before meals.     Marland Kitchen oxyCODONE-acetaminophen (PERCOCET) 10-325 MG tablet Take 1 tablet by mouth every 6 (six) hours as needed for pain. 15 tablet 0  . pantoprazole (PROTONIX) 40 MG tablet Take 40 mg by mouth every morning.     . sodium bicarbonate 650 MG tablet Take 1,300 mg by mouth 3 (three) times daily.       No results found for this or any previous visit (from the past 48 hour(s)). No results found.  ROS  Blood pressure 181/73, pulse 72, temperature 98.5 F (36.9 C), temperature source Oral, resp. rate 11, height 5' 4" (1.626 m), weight 185 lb (83.915 kg), SpO2 96 %. Physical Exam  Constitutional: She appears well-developed and well-nourished.  HENT:  Mouth/Throat: Oropharynx is clear and moist.  Eyes:  Conjunctiva is pale. Sclera is nonicteric.  Neck: No thyromegaly present.  Cardiovascular: Normal rate and regular rhythm.   Murmur (faint systolic ejection murmur at left sternal border.) heard. Respiratory: Effort normal and breath sounds normal.  GI:  Abdomen is full. It is soft with mild midepigastric tenderness. No organomegaly or masses.     Assessment/Plan Anemia with heme positive stool and no bleeding lesion found on colonoscopy of April 2017. Chronic GERD. Diagnostic EGD.  Hildred Laser,  MD 03/11/2016, 12:39 PM

## 2016-03-17 ENCOUNTER — Encounter (HOSPITAL_COMMUNITY): Payer: Self-pay | Admitting: Internal Medicine

## 2016-05-07 ENCOUNTER — Emergency Department (HOSPITAL_COMMUNITY)
Admission: EM | Admit: 2016-05-07 | Discharge: 2016-05-07 | Disposition: A | Discharge status: Home / Self Care | Payer: Medicare Other | Attending: Emergency Medicine | Admitting: Emergency Medicine

## 2016-05-07 ENCOUNTER — Emergency Department (HOSPITAL_COMMUNITY): Payer: Medicare Other

## 2016-05-07 ENCOUNTER — Encounter (HOSPITAL_COMMUNITY): Payer: Self-pay | Admitting: Emergency Medicine

## 2016-05-07 DIAGNOSIS — I129 Hypertensive chronic kidney disease with stage 1 through stage 4 chronic kidney disease, or unspecified chronic kidney disease: Secondary | ICD-10-CM | POA: Insufficient documentation

## 2016-05-07 DIAGNOSIS — T82858A Stenosis of vascular prosthetic devices, implants and grafts, initial encounter: Secondary | ICD-10-CM

## 2016-05-07 DIAGNOSIS — Z794 Long term (current) use of insulin: Secondary | ICD-10-CM | POA: Diagnosis not present

## 2016-05-07 DIAGNOSIS — E1122 Type 2 diabetes mellitus with diabetic chronic kidney disease: Secondary | ICD-10-CM | POA: Insufficient documentation

## 2016-05-07 DIAGNOSIS — T829XXA Unspecified complication of cardiac and vascular prosthetic device, implant and graft, initial encounter: Secondary | ICD-10-CM

## 2016-05-07 DIAGNOSIS — Y69 Unspecified misadventure during surgical and medical care: Secondary | ICD-10-CM | POA: Diagnosis not present

## 2016-05-07 DIAGNOSIS — F17210 Nicotine dependence, cigarettes, uncomplicated: Secondary | ICD-10-CM | POA: Insufficient documentation

## 2016-05-07 DIAGNOSIS — N189 Chronic kidney disease, unspecified: Secondary | ICD-10-CM | POA: Insufficient documentation

## 2016-05-07 DIAGNOSIS — T827XXA Infection and inflammatory reaction due to other cardiac and vascular devices, implants and grafts, initial encounter: Secondary | ICD-10-CM | POA: Diagnosis not present

## 2016-05-07 DIAGNOSIS — Z85828 Personal history of other malignant neoplasm of skin: Secondary | ICD-10-CM | POA: Insufficient documentation

## 2016-05-07 DIAGNOSIS — Z79899 Other long term (current) drug therapy: Secondary | ICD-10-CM | POA: Diagnosis not present

## 2016-05-07 LAB — BASIC METABOLIC PANEL
ANION GAP: 10 (ref 5–15)
BUN: 65 mg/dL — AB (ref 6–20)
CHLORIDE: 109 mmol/L (ref 101–111)
CO2: 21 mmol/L — ABNORMAL LOW (ref 22–32)
Calcium: 8.5 mg/dL — ABNORMAL LOW (ref 8.9–10.3)
Creatinine, Ser: 4.37 mg/dL — ABNORMAL HIGH (ref 0.44–1.00)
GFR calc Af Amer: 11 mL/min — ABNORMAL LOW (ref >60)
GFR, EST NON AFRICAN AMERICAN: 10 mL/min — AB (ref >60)
GLUCOSE: 157 mg/dL — AB (ref 65–99)
POTASSIUM: 4.3 mmol/L (ref 3.5–5.1)
SODIUM: 140 mmol/L (ref 135–145)

## 2016-05-07 LAB — CBC
HCT: 27.1 % — ABNORMAL LOW (ref 36.0–46.0)
HEMOGLOBIN: 9.4 g/dL — AB (ref 12.0–15.0)
MCH: 32.3 pg (ref 26.0–34.0)
MCHC: 34.7 g/dL (ref 30.0–36.0)
MCV: 93.1 fL (ref 78.0–100.0)
PLATELETS: 141 10*3/uL — AB (ref 150–400)
RBC: 2.91 MIL/uL — AB (ref 3.87–5.11)
RDW: 15.2 % (ref 11.5–15.5)
WBC: 7.4 10*3/uL (ref 4.0–10.5)

## 2016-05-07 NOTE — ED Notes (Addendum)
[employer] called for transport. STates patient will have ride on the way.

## 2016-05-07 NOTE — ED Provider Notes (Signed)
[facility] DEPT Provider Note   CSN: [id number] Arrival date & time: 05/07/16  1337     History   Chief Complaint Chief Complaint  Patient presents with  . Vascular Access Problem    HPI Tanya Lucero is a 65 y.o. female.  HPI Patient presents to the emergency department from her dialysis center for difficulty with functioning of her right forearm AV fistula.  She's had this fistula for approximately a year and a half and never had issues with that before.  Her last full run of dialysis was on Monday 4 days ago.  On Wednesday when accessed for dialysis fistula she had some pain and swelling and therefore she was de-accessed.  Again today when her fistula was accessed she again had pain and swelling.  She was transferred to the ER for further evaluation and accepted in transfer by Dr. Rosana Hoes, vascular surgery.  On arrival to the emergency department the patient is without significant complaints.  She presents with a thrill in her right AV fistula no significant pain but does have mild swelling.  No fevers or chills.  No other complaints.  No shortness of breath.   Past Medical History:  Diagnosis Date  . Anemia   . Anxiety   . Arthritis   . Chronic kidney disease    neuphrotic syndrome-05/2013  . Constipation   . Decreased vision    left eye  . Depression   . Diabetes mellitus    Type 2  . Diabetic neuropathy ([place])   . Diverticulitis   . Dry skin   . GERD (gastroesophageal reflux disease)   . Gout   . Headache   . Hypertension   . Neuropathy ([place])   . Shortness of breath dyspnea   . Skin cancer     Patient Active Problem List   Diagnosis Date Noted  . RENAL INSUFFICIENCY 03/04/2010  . DM 02/17/2010  . HYPERTENSION 02/17/2010  . OTHER MALAISE AND FATIGUE 02/17/2010  . SHORTNESS OF BREATH 02/17/2010  . CHEST PAIN, PRECORDIAL 02/17/2010    Past Surgical History:  Procedure Laterality Date  . ABDOMINAL HYSTERECTOMY    . ADENOIDECTOMY    . APPENDECTOMY     taken out with hysterectomy  . AV FISTULA PLACEMENT Right 09/13/2014   Procedure: Creation of Right Arm RADIOCEPHALIC ARTERIOVENOUS (AV) FISTULA ;  Surgeon: [name], MD;  Location: [facility];  Service: Vascular;  Laterality: Right;  . BACK SURGERY     lumbar -   . CATARACT EXTRACTION W/PHACO Left 07/02/2013   Procedure: CATARACT EXTRACTION PHACO AND INTRAOCULAR LENS PLACEMENT (IOC);  Surgeon: [name], MD;  Location: [facility];  Service: Ophthalmology;  Laterality: Left;  CDE:  6.46  . CATARACT EXTRACTION W/PHACO Right 07/12/2013   Procedure: CATARACT EXTRACTION PHACO AND INTRAOCULAR LENS PLACEMENT (IOC);  Surgeon: [name], MD;  Location: [facility];  Service: Ophthalmology;  Laterality: Right;  CDE:12.48  . COLONOSCOPY N/A 12/11/2015   Procedure: COLONOSCOPY;  Surgeon: [name], MD;  [facility];  Service: Endoscopy;  Laterality: N/A;  1:55  . ESOPHAGOGASTRODUODENOSCOPY N/A 03/11/2016   Procedure: ESOPHAGOGASTRODUODENOSCOPY (EGD);  Surgeon: [name], MD;  Location: [facility];  Service: Endoscopy;  Laterality: N/A;  1230 - Last pt of the day-Dr. Laural Golden going to office to see pt's  . FOOT SURGERY Right    x2-heel spur   . REVISON OF ARTERIOVENOUS FISTULA Right 01/05/2016   Procedure: REVISON OF ARTERIOVENOUS FISTULA- right forearm;  Surgeon:  Larina Earthly, MD;  Location: [facility] OR;  Service: Vascular;  Laterality: Right;  . SKIN CANCER EXCISION     forehead  . TONSILLECTOMY      OB History    No data available       Home Medications    Prior to Admission medications   Medication Sig Start Date End Date Taking? Authorizing Provider  acetaminophen (TYLENOL) 325 MG tablet Take 650 mg by mouth every 6 (six) hours as needed for headache.    Historical Provider, MD  ALPRAZolam Prudy Feeler) 0.5 MG tablet Take 0.5 mg by mouth 2 (two) times daily. Reported on 02/03/2016    Historical Provider, MD  amLODipine (NORVASC) 10 MG tablet Take 10 mg by mouth daily.    Historical Provider, MD   b complex vitamins capsule Take 1 capsule by mouth daily.    Historical Provider, MD  calcitRIOL (ROCALTROL) 0.25 MCG capsule Take 0.25 mcg by mouth daily. M-W-F    Historical Provider, MD  calcium carbonate (OS-CAL) 600 MG TABS tablet Take 600 mg by mouth daily with breakfast.    Historical Provider, MD  carvedilol (COREG) 6.25 MG tablet Take 6.25 mg by mouth 2 (two) times daily with a meal.    Historical Provider, MD  docusate sodium (COLACE) 100 MG capsule Take 100 mg by mouth 2 (two) times daily.    Historical Provider, MD  DULoxetine (CYMBALTA) 60 MG capsule Take 60 mg by mouth daily. Reported on 02/03/2016    Historical Provider, MD  Ferrous Sulfate (FEROSUL PO) Take by mouth.    Historical Provider, MD  furosemide (LASIX) 20 MG tablet Take 20 mg by mouth every evening.     Historical Provider, MD  furosemide (LASIX) 40 MG tablet Take 40 mg by mouth every morning.    Historical Provider, MD  gabapentin (NEURONTIN) 300 MG capsule Take 300 mg by mouth 3 (three) times daily.    Historical Provider, MD  glipiZIDE (GLUCOTROL) 5 MG tablet Take 5 mg by mouth 2 (two) times daily before a meal.     Historical Provider, MD  hydrALAZINE (APRESOLINE) 50 MG tablet Take 1 tablet by mouth 3 (three) times daily.  09/03/14   Historical Provider, MD  insulin glargine (LANTUS) 100 UNIT/ML injection Inject 65 Units into the skin at bedtime.     Historical Provider, MD  insulin lispro protamine-lispro (HUMALOG 75/25 MIX) (75-25) 100 UNIT/ML SUSP injection Inject 22 Units into the skin 3 (three) times daily before meals.     Historical Provider, MD  oxyCODONE-acetaminophen (PERCOCET) 10-325 MG tablet Take 1 tablet by mouth every 6 (six) hours as needed for pain. 01/05/16   Raymond Gurney, PA-C  pantoprazole (PROTONIX) 40 MG tablet Take 40 mg by mouth every morning.     Historical Provider, MD  sodium bicarbonate 650 MG tablet Take 1,300 mg by mouth 3 (three) times daily.     Historical Provider, MD    Family  History Family History  Problem Relation Age of Onset  . Multiple myeloma Mother   . Diabetes Mother   . Heart attack Father 37    Social History Social History  Substance Use Topics  . Smoking status: Current Every Day Smoker    Packs/day: 0.25    Years: 40.00    Types: Cigarettes  . Smokeless tobacco: Never Used  . Alcohol use No     Allergies   Penicillins   Review of Systems Review of Systems  All other systems reviewed and are  negative.    Physical Exam Updated Vital Signs BP 154/63 (BP Location: Left Arm)   Pulse 77   Temp 98.1 F (36.7 C) (Oral)   Resp 16   Ht 5' 4" (1.626 m)   Wt 185 lb (83.9 kg)   SpO2 99%   BMI 31.76 kg/m   Physical Exam  Constitutional: She is oriented to person, place, and time. She appears well-developed and well-nourished.  HENT:  Head: Normocephalic.  Eyes: EOM are normal.  Neck: Normal range of motion.  Pulmonary/Chest: Effort normal.  Abdominal: She exhibits no distension.  Musculoskeletal: Normal range of motion.  Strong thrill in the distal aspect of the right anterior forearm fistula.  There is some mild swelling proximal to this.  There is no warmth or erythema or signs of infection of the right forearm.  Normal grip strength right hand.  Neurological: She is alert and oriented to person, place, and time.  Skin: Skin is warm. No erythema.  Psychiatric: She has a normal mood and affect.  Nursing note and vitals reviewed.    ED Treatments / Results  Labs (all labs ordered are listed, but only abnormal results are displayed) Labs Reviewed  CBC - Abnormal; Notable for the following:       Result Value   RBC 2.91 (*)    Hemoglobin 9.4 (*)    HCT 27.1 (*)    Platelets 141 (*)    All other components within normal limits  BASIC METABOLIC PANEL - Abnormal; Notable for the following:    CO2 21 (*)    Glucose, Bld 157 (*)    BUN 65 (*)    Creatinine, Ser 4.37 (*)    Calcium 8.5 (*)    GFR calc non Af Amer 10 (*)     GFR calc Af Amer 11 (*)    All other components within normal limits    EKG  EKG Interpretation None       Radiology [place] Dialysis Access  Result Date: 05/07/2016 CLINICAL DATA:  Arteriovenous fistula EXAM: ULTRASOUND DIALYSIS ACCESS COMPARISON:  None. FINDINGS: Images of vascular structures about the wrist are noted in both regular and color Doppler imaging. Doppler analysis demonstrates pulsatile flow. IMPRESSION: See above. Electronically Signed   By: Marybelle Killings M.D.   On: 05/07/2016 15:50    Procedures Procedures (including critical care time)  Medications Ordered in ED Medications - No data to display   Initial Impression / Assessment and Plan / ED Course  I have reviewed the triage vital signs and the nursing notes.  Pertinent labs & imaging results that were available during my care of the patient were reviewed by me and considered in my medical decision making (see chart for details).  Clinical Course    The patient was seen in consultation by Dr. Rosana Hoes.  She underwent ultrasound of her AV fistula which demonstrated pulsatile flow without obvious stenosis.  Based on her laboratory studies no indication for urgent/emergent dialysis as needed.  The case was discussed between vascular surgery and our nephrologist who will have the patient follow-up with her normal dialysis unit on Monday.  Please see vascular surgery consultation note for complete details  Final Clinical Impressions(s) / ED Diagnoses   Final diagnoses:  Complication of AV dialysis fistula, initial encounter    New Prescriptions New Prescriptions   No medications on file     Jola Schmidt, MD 05/07/16 1611

## 2016-05-07 NOTE — ED Triage Notes (Signed)
Patient states she went to dialysis on Wednesday and started having swelling and pain at graft site on right forearm. Patient states swelling and worsening pain today during dialysis. Site is swollen and hot. Patient was sent here by dialysis center.

## 2016-05-07 NOTE — Consult Note (Signed)
SURGICAL CONSULTATION NOTE (initial) - cpt: 03009 (ED)  HISTORY OF PRESENT ILLNESS (HPI):  65 y.o. 70 female with ESRD attributable to diabetes mellitus and HTN on MWF HD presented with inability of dialysis clinic to access her Right forearm radial-cephalic AV fistula today. She reports that she started hemodialysis 10 days ago via her Right forearm AV fistula. The AV fistula was used x3 for hemodialysis, most recently this past Monday. On Wednesday, however, the fistula became infiltrated when access of it was attempted, causing formation of a large and painful forearm hematoma. She at that time went home and was advised to apply ice to the hematoma. Today, she returned for hemodialysis, but the access center was again unable to access the fistula and, accordingly, referred the patient to [facility] for further evaluation and possible access + dialysis. Patient denies any SOB or lower extremity edema.  Surgery is consulted by ED physician Dr. Venora Maples in this context for evaluation and management of patient's AV fistula dysfunction.  PAST MEDICAL HISTORY (PMH):  Past Medical History:  Diagnosis Date  . Anemia   . Anxiety   . Arthritis   . Chronic kidney disease    neuphrotic syndrome-05/2013  . Constipation   . Decreased vision    left eye  . Depression   . Diabetes mellitus    Type 2  . Diabetic neuropathy ([place])   . Diverticulitis   . Dry skin   . GERD (gastroesophageal reflux disease)   . Gout   . Headache   . Hypertension   . Neuropathy ([place])   . Shortness of breath dyspnea   . Skin cancer      PAST SURGICAL HISTORY ([place]):  Past Surgical History:  Procedure Laterality Date  . ABDOMINAL HYSTERECTOMY    . ADENOIDECTOMY    . APPENDECTOMY     taken out with hysterectomy  . AV FISTULA PLACEMENT Right 09/13/2014   Procedure: Creation of Right Arm RADIOCEPHALIC ARTERIOVENOUS (AV) FISTULA ;  Surgeon: [name], MD;  Location: [facility];  Service: Vascular;  Laterality: Right;   . BACK SURGERY     lumbar -   . CATARACT EXTRACTION W/PHACO Left 07/02/2013   Procedure: CATARACT EXTRACTION PHACO AND INTRAOCULAR LENS PLACEMENT (IOC);  Surgeon: [name], MD;  [facility];  Service: Ophthalmology;  Laterality: Left;  CDE:  6.46  . CATARACT EXTRACTION W/PHACO Right 07/12/2013   Procedure: CATARACT EXTRACTION PHACO AND INTRAOCULAR LENS PLACEMENT (IOC);  Surgeon: [name], MD;  [facility];  Service: Ophthalmology;  Laterality: Right;  CDE:12.48  . COLONOSCOPY N/A 12/11/2015   Procedure: COLONOSCOPY;  Surgeon: [name], MD;  [facility];  Service: Endoscopy;  Laterality: N/A;  1:55  . ESOPHAGOGASTRODUODENOSCOPY N/A 03/11/2016   Procedure: ESOPHAGOGASTRODUODENOSCOPY (EGD);  Surgeon: [name], MD;  [facility];  Service: Endoscopy;  Laterality: N/A;  1230 - Last pt of the day-Dr. Laural Golden going to office to see pt's  . FOOT SURGERY Right    x2-heel spur   . REVISON OF ARTERIOVENOUS FISTULA Right 01/05/2016   Procedure: REVISON OF ARTERIOVENOUS FISTULA- right forearm;  Surgeon: [name], MD;  Location: [facility];  Service: Vascular;  Laterality: Right;  . SKIN CANCER EXCISION     forehead  . TONSILLECTOMY       MEDICATIONS:  Prior to Admission medications   Medication Sig Start Date End Date Taking? Authorizing Provider  acetaminophen (TYLENOL) 325 MG tablet Take 650 mg by mouth  every 6 (six) hours as needed for headache.    Historical Provider, MD  ALPRAZolam Duanne Moron) 0.5 MG tablet Take 0.5 mg by mouth 2 (two) times daily. Reported on 02/03/2016    Historical Provider, MD  amLODipine (NORVASC) 10 MG tablet Take 10 mg by mouth daily.    Historical Provider, MD  b complex vitamins capsule Take 1 capsule by mouth daily.    Historical Provider, MD  calcitRIOL (ROCALTROL) 0.25 MCG capsule Take 0.25 mcg by mouth daily. M-W-F    Historical Provider, MD  calcium carbonate (OS-CAL) 600 MG TABS tablet Take 600 mg by mouth daily with breakfast.     Historical Provider, MD  carvedilol (COREG) 6.25 MG tablet Take 6.25 mg by mouth 2 (two) times daily with a meal.    Historical Provider, MD  docusate sodium (COLACE) 100 MG capsule Take 100 mg by mouth 2 (two) times daily.    Historical Provider, MD  DULoxetine (CYMBALTA) 60 MG capsule Take 60 mg by mouth daily. Reported on 02/03/2016    Historical Provider, MD  Ferrous Sulfate (FEROSUL PO) Take by mouth.    Historical Provider, MD  furosemide (LASIX) 20 MG tablet Take 20 mg by mouth every evening.     Historical Provider, MD  furosemide (LASIX) 40 MG tablet Take 40 mg by mouth every morning.    Historical Provider, MD  gabapentin (NEURONTIN) 300 MG capsule Take 300 mg by mouth 3 (three) times daily.    Historical Provider, MD  glipiZIDE (GLUCOTROL) 5 MG tablet Take 5 mg by mouth 2 (two) times daily before a meal.     Historical Provider, MD  hydrALAZINE (APRESOLINE) 50 MG tablet Take 1 tablet by mouth 3 (three) times daily.  09/03/14   Historical Provider, MD  insulin glargine (LANTUS) 100 UNIT/ML injection Inject 65 Units into the skin at bedtime.     Historical Provider, MD  insulin lispro protamine-lispro (HUMALOG 75/25 MIX) (75-25) 100 UNIT/ML SUSP injection Inject 22 Units into the skin 3 (three) times daily before meals.     Historical Provider, MD  oxyCODONE-acetaminophen (PERCOCET) 10-325 MG tablet Take 1 tablet by mouth every 6 (six) hours as needed for pain. 01/05/16   Alvia Grove, PA-C  pantoprazole (PROTONIX) 40 MG tablet Take 40 mg by mouth every morning.     Historical Provider, MD  sodium bicarbonate 650 MG tablet Take 1,300 mg by mouth 3 (three) times daily.     Historical Provider, MD     ALLERGIES:  Allergies  Allergen Reactions  . Penicillins Rash     SOCIAL HISTORY:  Social History   Social History  . Marital status: Divorced    Spouse name: N/A  . Number of children: N/A  . Years of education: N/A   Occupational History  . Not on file.   Social History  Main Topics  . Smoking status: Current Every Day Smoker    Packs/day: 0.25    Years: 40.00    Types: Cigarettes  . Smokeless tobacco: Never Used  . Alcohol use No  . Drug use: No  . Sexual activity: Yes    Birth control/ protection: Surgical   Other Topics Concern  . Not on file   Social History Narrative   No children    The patient currently resides (home / rehab facility / nursing home): Skilled nursing facility The patient normally is (ambulatory / bedbound): Ambulatory  FAMILY HISTORY:  Family History  Problem Relation Age of Onset  . Multiple  myeloma Mother   . Diabetes Mother   . Heart attack Father 32     REVIEW OF SYSTEMS:  Constitutional: denies weight loss, fever, chills, or sweats  Eyes: denies any other vision changes, history of eye injury  ENT: denies sore throat, hearing problems  Respiratory: denies shortness of breath, wheezing  Cardiovascular: denies chest pain, palpitations  Gastrointestinal: denies abdominal pain, N/V, or diarrhea  Musculoskeletal: moderate Right forearm pain, denies any other joint pains or cramps  Skin: denies any other rashes or skin discolorations  Neurological: denies any other headache, dizziness, weakness  Psychiatric: denies any other depression, anxiety   All other review of systems were negative   VITAL SIGNS:  Temp:  [98.1 F (36.7 C)] 98.1 F (36.7 C) (09/01 1344) Pulse Rate:  [72-77] 73 (09/01 1600) Resp:  [14-16] 16 (09/01 1600) BP: (154-173)/(63-71) 173/71 (09/01 1600) SpO2:  [95 %-99 %] 95 % (09/01 1600) Weight:  [83.9 kg (185 lb)] 83.9 kg (185 lb) (09/01 1344)     Height: 5' 4" (162.6 cm) Weight: 83.9 kg (185 lb) BMI (Calculated): 31.8   INTAKE/OUTPUT:  This shift: No intake/output data recorded.  Last 2 shifts: _0 @   PHYSICAL EXAM:  Constitutional:  -- Normal body habitus  -- Awake, alert, and oriented x3  Eyes:  -- Pupils equally round and reactive to light  -- No scleral icterus  Ear,  nose, and throat:  -- No jugular venous distension  Pulmonary:  -- No crackles  -- Equal breath sounds bilaterally -- Breathing non-labored at rest Cardiovascular:  -- S1, S2 present  -- No pericardial rubs Gastrointestinal:  -- Abdomen soft, nontender, nondistended, no guarding/rebound  -- No abdominal masses appreciated, pulsatile or otherwise  Musculoskeletal / Integumentary:  -- Wounds or skin discoloration: Well-healed RUE radial-cephalic AVF with mid-forearm tender hematoma -- Extremities: B/L UE and LE FROM, hands and feet warm, no edema  Neurologic:  -- Motor function: intact and symmetric -- Sensation: intact and symmetric  Pulse/Doppler Exam: (p=palpable; d=doppler signals; 0=none)      Right    Left   AVF Easily palpable non-pulsatile thrill except where obscured and deeper due to surrounding Right forearm hematoma  Brach   p     p   Rad    p     p   Labs:  CBC:  CBC Latest Ref Rng & Units 05/07/2016 03/11/2016 01/05/2016  WBC 4.0 - 10.5 K/uL 7.4 - -  Hemoglobin 12.0 - 15.0 g/dL 9.4(L) 8.6(L) 8.5(L)  Hematocrit 36.0 - 46.0 % 27.1(L) 25.8(L) 25.0(L)  Platelets 150 - 400 K/uL 141(L) - -    BMP:  BMP Latest Ref Rng & Units 05/07/2016 01/05/2016 09/13/2014  Glucose 65 - 99 mg/dL 157(H) 106(H) 237(H)  BUN 6 - 20 mg/dL 65(H) - -  Creatinine 0.44 - 1.00 mg/dL 4.37(H) - -  Sodium 135 - 145 mmol/L 140 144 141  Potassium 3.5 - 5.1 mmol/L 4.3 4.7 3.6  Chloride 101 - 111 mmol/L 109 - -  CO2 22 - 32 mmol/L 21(L) - -  Calcium 8.9 - 10.3 mg/dL 8.5(L) - -    Imaging studies:  Right upper extremity radial-cephalic AV fistula duplex (05/07/2016) - plan determined in part based on personal review/interpretation of this study Patent Right upper extremity radial-cephalic AV fistula with large mid-forearm hematoma, increasing depth of fistula from skin surface, no evidence of focal stenosis or occlusion.  Assessment/Plan: (ICD-10's: T76.858A) 65 y.o. female with patent Right upper  extremity radial-cephalic AV  fistula and forearm hematoma, complicated by pertinent comorbidities including diabetes mellitus, HTN, .   - labs checked, no indication for urgent hemodialysis per Dr. Berton Bon  - Right upper extremity arteriovenous fistula personally reviewed and interpreted  - return to dialysis clinic for regularly scheduled dialysis on Monday per Dr. Berton Bon  - if RUE AVF still unable to be accessed on Monday, return to ED and may need to u/s-guided AVF access for inpatient HD  - okay for discharge/return to patient's home/facility  - DVT prophylaxis  All of the above findings and recommendations were discussed with the patient, and all of her questions were answered to her expressed satisfaction.  Thank you for the opportunity to participate in this patient's care.   -- Marilynne Drivers Rosana Hoes, MD, [place]: [facility] Office: [phone]

## 2016-10-19 ENCOUNTER — Ambulatory Visit (INDEPENDENT_AMBULATORY_CARE_PROVIDER_SITE_OTHER): Payer: Medicare Other | Admitting: Internal Medicine

## 2016-10-26 ENCOUNTER — Encounter (INDEPENDENT_AMBULATORY_CARE_PROVIDER_SITE_OTHER): Payer: Self-pay | Admitting: Internal Medicine

## 2016-10-26 ENCOUNTER — Encounter (INDEPENDENT_AMBULATORY_CARE_PROVIDER_SITE_OTHER): Payer: Self-pay

## 2016-10-26 ENCOUNTER — Ambulatory Visit (INDEPENDENT_AMBULATORY_CARE_PROVIDER_SITE_OTHER): Payer: Medicare Other | Admitting: Internal Medicine

## 2016-10-26 VITALS — BP 120/72 | HR 72 | Temp 98.3°F | Ht 64.0 in | Wt 196.8 lb

## 2016-10-26 DIAGNOSIS — D500 Iron deficiency anemia secondary to blood loss (chronic): Secondary | ICD-10-CM | POA: Diagnosis not present

## 2016-10-26 NOTE — Patient Instructions (Signed)
Given capsule

## 2016-10-26 NOTE — Progress Notes (Signed)
Subjective:    Patient ID: Tanya Lucero, female    DOB: 07/24/1951, 66 y.o.   MRN: [id number]  HPI Referred by Dr .Hinda Lenis for anemia.  Receives Epogen 14,000 units 3 x a week with dialysis. Takes Dialysis M-W-F Appetite is good. No weight loss.  Has a BM every day. Resident of [facility] in [place]  Her appetite is good. No weight loss.  Usually has a BM daily. States she really cannot tell if she has any blood in her stool.     09/27/2016 H and H 7.9 and 25.6. 10/02/2015 Ferritin 490, Iron Satu 23, Iron 45, TIBC 194, UIBC 149.    Hx of CRF, stage 5 and is followed by Dr. Hinda Lenis. Has been seeing Dr Hinda Lenis for about 2 years.  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  Heme positive stool.         Normal esophagus. Nodular mucosa in the gastric antrum. 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   Biopsy:  Duodenal biopsy shows normal mucosa and iron pigment/hemosiderin. Gastric biopsy shows hyperplastic polyp with iron pigment/hemosiderin. No changes to suggest GAVE. Patient advised to stop iron.. Send copy of recent H&H biopsy results to Dr. Hinda Lenis. Tammy, please ask staff at Dr. Rhona Leavens office to do Hemoccults on patient in one month If Hemoccult is positive will consider small bowel given capsule study.   12/21/2015 Colonoscopy:       were small. Impression:               - Two 4 to 5 mm polyps at the recto-sigmoid colon                            and in the cecum, removed with a cold snare.                            Resected and retrieved.                           - One 12 mm polyp at the hepatic flexure, removed                            with a hot snare. Resected and retrieved.                           - One 8 mm polyp in the transverse colon, removed                            with a hot snare. Resected and retrieved.                           - Diverticulosis in the sigmoid colon.                           - External  hemorrhoids.  Results reviewed with patient's sister Ramona. She had 4 polyps removed. 2 are tubular adenomas. Next colonoscopy in 5 years.   Review of Systems Past Medical History:  Diagnosis Date  . Anemia   . Anxiety   . Arthritis   . Chronic kidney disease  neuphrotic syndrome-05/2013  . Constipation   . Decreased vision    left eye  . Depression   . Diabetes mellitus    Type 2  . Diabetic neuropathy ([place])   . Diverticulitis   . Dry skin   . GERD (gastroesophageal reflux disease)   . Gout   . Headache   . Hypertension   . Neuropathy ([place])   . Shortness of breath dyspnea   . Skin cancer     Past Surgical History:  Procedure Laterality Date  . ABDOMINAL HYSTERECTOMY    . ADENOIDECTOMY    . APPENDECTOMY     taken out with hysterectomy  . AV FISTULA PLACEMENT Right 09/13/2014   Procedure: Creation of Right Arm RADIOCEPHALIC ARTERIOVENOUS (AV) FISTULA ;  Surgeon: [name], MD;  Location: [facility];  Service: Vascular;  Laterality: Right;  . BACK SURGERY     lumbar -   . CATARACT EXTRACTION W/PHACO Left 07/02/2013   Procedure: CATARACT EXTRACTION PHACO AND INTRAOCULAR LENS PLACEMENT (IOC);  Surgeon: [name], MD;  [facility];  Service: Ophthalmology;  Laterality: Left;  CDE:  6.46  . CATARACT EXTRACTION W/PHACO Right 07/12/2013   Procedure: CATARACT EXTRACTION PHACO AND INTRAOCULAR LENS PLACEMENT (IOC);  Surgeon: [name], MD;  [facility];  Service: Ophthalmology;  Laterality: Right;  CDE:12.48  . COLONOSCOPY N/A 12/11/2015   Procedure: COLONOSCOPY;  Surgeon: [name], MD;  [facility];  Service: Endoscopy;  Laterality: N/A;  1:55  . ESOPHAGOGASTRODUODENOSCOPY N/A 03/11/2016   Procedure: ESOPHAGOGASTRODUODENOSCOPY (EGD);  Surgeon: [name], MD;  [facility];  Service: Endoscopy;  Laterality: N/A;  1230 - Last pt of the day-Dr. Laural Golden going to office to see pt's  . FOOT SURGERY Right    x2-heel spur   . REVISON OF  ARTERIOVENOUS FISTULA Right 01/05/2016   Procedure: REVISON OF ARTERIOVENOUS FISTULA- right forearm;  Surgeon: [name], MD;  Location: [facility];  Service: Vascular;  Laterality: Right;  . SKIN CANCER EXCISION     forehead  . TONSILLECTOMY      Allergies  Allergen Reactions  . Penicillins Rash    Current Outpatient Prescriptions on File Prior to Visit  Medication Sig Dispense Refill  . acetaminophen (TYLENOL) 325 MG tablet Take 650 mg by mouth every 6 (six) hours as needed for headache.    . ALPRAZolam (XANAX) 0.5 MG tablet Take 0.5 mg by mouth 2 (two) times daily. Reported on 02/03/2016    . amLODipine (NORVASC) 10 MG tablet Take 10 mg by mouth daily.    Marland Kitchen b complex vitamins capsule Take 1 capsule by mouth daily.    . calcitRIOL (ROCALTROL) 0.25 MCG capsule Take 0.25 mcg by mouth daily. M-W-F    . calcium carbonate (OS-CAL) 600 MG TABS tablet Take 600 mg by mouth daily with breakfast.    . carvedilol (COREG) 6.25 MG tablet Take 6.25 mg by mouth 2 (two) times daily with a meal.    . docusate sodium (COLACE) 100 MG capsule Take 100 mg by mouth 2 (two) times daily.    . DULoxetine (CYMBALTA) 60 MG capsule Take 60 mg by mouth daily. Reported on 02/03/2016    . Ferrous Sulfate (FEROSUL PO) Take by mouth.    . furosemide (LASIX) 20 MG tablet Take 20 mg by mouth every evening.     . furosemide (LASIX) 40 MG tablet Take 40 mg by mouth every morning.    . gabapentin (NEURONTIN) 300  MG capsule Take 300 mg by mouth 3 (three) times daily.    Marland Kitchen glipiZIDE (GLUCOTROL) 5 MG tablet Take 5 mg by mouth 2 (two) times daily before a meal.     . hydrALAZINE (APRESOLINE) 50 MG tablet Take 1 tablet by mouth 3 (three) times daily.   3  . insulin glargine (LANTUS) 100 UNIT/ML injection Inject 60 Units into the skin at bedtime.     . pantoprazole (PROTONIX) 40 MG tablet Take 40 mg by mouth every morning.     . sodium bicarbonate 650 MG tablet Take 1,300 mg by mouth 3 (three) times daily.      No current  facility-administered medications on file prior to visit.        Objective:   Physical Exam Blood pressure 120/72, pulse 72, temperature 98.3 F (36.8 C), height 5\' 4"  (1.626 m), weight 196 lb 12.8 oz (89.3 kg). Alert and oriented. Skin warm and dry. Oral mucosa is moist.   . Sclera anicteric, conjunctivae is pink. Thyroid not enlarged. No cervical lymphadenopathy. Lungs clear. Heart regular rate and rhythm.  Abdomen is soft. Bowel sounds are positive. No hepatomegaly. No abdominal masses felt. No tenderness.  No edema to lower extremities.   Shunt rt arm. . Stool brown and guaiac positive.        Assessment & Plan:  IDA.  Given's capsule. I discussed with Dr Laural Golden.

## 2016-10-27 ENCOUNTER — Encounter (INDEPENDENT_AMBULATORY_CARE_PROVIDER_SITE_OTHER): Payer: Self-pay | Admitting: *Deleted

## 2016-10-27 DIAGNOSIS — D500 Iron deficiency anemia secondary to blood loss (chronic): Secondary | ICD-10-CM | POA: Insufficient documentation

## 2016-11-04 ENCOUNTER — Encounter (INDEPENDENT_AMBULATORY_CARE_PROVIDER_SITE_OTHER): Payer: Self-pay

## 2016-11-09 ENCOUNTER — Encounter (HOSPITAL_COMMUNITY)
Admission: RE | Disposition: A | Discharge status: Home / Self Care | Payer: Self-pay | Source: Ambulatory Visit | Attending: Internal Medicine

## 2016-11-09 ENCOUNTER — Ambulatory Visit (HOSPITAL_COMMUNITY)
Admission: RE | Admit: 2016-11-09 | Discharge: 2016-11-09 | Disposition: A | Discharge status: Home / Self Care | Payer: Medicare Other | Source: Ambulatory Visit | Attending: Internal Medicine | Admitting: Internal Medicine

## 2016-11-09 DIAGNOSIS — I120 Hypertensive chronic kidney disease with stage 5 chronic kidney disease or end stage renal disease: Secondary | ICD-10-CM | POA: Insufficient documentation

## 2016-11-09 DIAGNOSIS — Z79899 Other long term (current) drug therapy: Secondary | ICD-10-CM | POA: Insufficient documentation

## 2016-11-09 DIAGNOSIS — F329 Major depressive disorder, single episode, unspecified: Secondary | ICD-10-CM | POA: Diagnosis not present

## 2016-11-09 DIAGNOSIS — Z794 Long term (current) use of insulin: Secondary | ICD-10-CM | POA: Insufficient documentation

## 2016-11-09 DIAGNOSIS — F17210 Nicotine dependence, cigarettes, uncomplicated: Secondary | ICD-10-CM | POA: Insufficient documentation

## 2016-11-09 DIAGNOSIS — E1140 Type 2 diabetes mellitus with diabetic neuropathy, unspecified: Secondary | ICD-10-CM | POA: Insufficient documentation

## 2016-11-09 DIAGNOSIS — N186 End stage renal disease: Secondary | ICD-10-CM | POA: Insufficient documentation

## 2016-11-09 DIAGNOSIS — D500 Iron deficiency anemia secondary to blood loss (chronic): Secondary | ICD-10-CM

## 2016-11-09 DIAGNOSIS — R195 Other fecal abnormalities: Secondary | ICD-10-CM | POA: Diagnosis not present

## 2016-11-09 DIAGNOSIS — K219 Gastro-esophageal reflux disease without esophagitis: Secondary | ICD-10-CM | POA: Diagnosis not present

## 2016-11-09 DIAGNOSIS — K2970 Gastritis, unspecified, without bleeding: Secondary | ICD-10-CM | POA: Insufficient documentation

## 2016-11-09 DIAGNOSIS — E1122 Type 2 diabetes mellitus with diabetic chronic kidney disease: Secondary | ICD-10-CM | POA: Insufficient documentation

## 2016-11-09 DIAGNOSIS — F419 Anxiety disorder, unspecified: Secondary | ICD-10-CM | POA: Diagnosis not present

## 2016-11-09 DIAGNOSIS — D509 Iron deficiency anemia, unspecified: Secondary | ICD-10-CM | POA: Diagnosis present

## 2016-11-09 HISTORY — PX: GIVENS CAPSULE STUDY: SHX5432

## 2016-11-09 SURGERY — IMAGING PROCEDURE, GI TRACT, INTRALUMINAL, VIA CAPSULE

## 2016-11-09 MED ORDER — SIMETHICONE 40 MG/0.6ML PO SUSP
ORAL | Status: AC
  Filled 2016-11-09: qty 0.6

## 2016-11-09 NOTE — H&P (Signed)
Tanya Lucero is an 66 y.o. female.   Chief Complaint: Patient is here for small bowel given capsule study. HPI: Patient is 66 year old Caucasian female with multiple medical problems including chronic kidney disease who was evaluated last year for iron deficiency anemia and heme-positive stool. She underwent EGD and colonoscopy. She had 4 polyps removed from her colon and 2 were adenomas. Her hemoglobin has decreased. She is therefore returning for reevaluation.  Past Medical History:  Diagnosis Date  . Anemia   . Anxiety   . Arthritis   . Chronic kidney disease    neuphrotic syndrome-05/2013  . Constipation   . Decreased vision    left eye  . Depression   . Diabetes mellitus    Type 2  . Diabetic neuropathy ([place])   . Diverticulitis   . Dry skin   . GERD (gastroesophageal reflux disease)   . Gout   . Headache   . Hypertension   . Neuropathy ([place])   . Shortness of breath dyspnea   . Skin cancer     Past Surgical History:  Procedure Laterality Date  . ABDOMINAL HYSTERECTOMY    . ADENOIDECTOMY    . APPENDECTOMY     taken out with hysterectomy  . AV FISTULA PLACEMENT Right 09/13/2014   Procedure: Creation of Right Arm RADIOCEPHALIC ARTERIOVENOUS (AV) FISTULA ;  Surgeon: [name], MD;  Location: [facility];  Service: Vascular;  Laterality: Right;  . BACK SURGERY     lumbar -   . CATARACT EXTRACTION W/PHACO Left 07/02/2013   Procedure: CATARACT EXTRACTION PHACO AND INTRAOCULAR LENS PLACEMENT (IOC);  Surgeon: [name], MD;  [facility];  Service: Ophthalmology;  Laterality: Left;  CDE:  6.46  . CATARACT EXTRACTION W/PHACO Right 07/12/2013   Procedure: CATARACT EXTRACTION PHACO AND INTRAOCULAR LENS PLACEMENT (IOC);  Surgeon: [name], MD;  [facility];  Service: Ophthalmology;  Laterality: Right;  CDE:12.48  . COLONOSCOPY N/A 12/11/2015   Procedure: COLONOSCOPY;  Surgeon: [name], MD;  [facility];  Service: Endoscopy;  Laterality: N/A;  1:55  .  ESOPHAGOGASTRODUODENOSCOPY N/A 03/11/2016   Procedure: ESOPHAGOGASTRODUODENOSCOPY (EGD);  Surgeon: [name], MD;  [facility];  Service: Endoscopy;  Laterality: N/A;  1230 - Last pt of the day-Dr. Laural Golden going to office to see pt's  . FOOT SURGERY Right    x2-heel spur   . REVISON OF ARTERIOVENOUS FISTULA Right 01/05/2016   Procedure: REVISON OF ARTERIOVENOUS FISTULA- right forearm;  Surgeon: [name], MD;  Location: [facility];  Service: Vascular;  Laterality: Right;  . SKIN CANCER EXCISION     forehead  . TONSILLECTOMY      Family History  Problem Relation Age of Onset  . Multiple myeloma Mother   . Diabetes Mother   . Heart attack Father 58   Social History:  reports that she has been smoking Cigarettes.  She has a 10.00 pack-year smoking history. She has never used smokeless tobacco. She reports that she does not drink alcohol or use drugs.  Allergies:  Allergies  Allergen Reactions  . Penicillins Rash    No prescriptions prior to admission.    No results found for this or any previous visit (from the past 48 hour(s)). No results found.  ROS  Height _0  (1.626 m), weight 196 lb (88.9 kg). Physical Exam   Assessment/Plan Iron deficiency anemia secondary to chronic GI blood loss. No source found on EGD and colonoscopy of last  year. Small bowel given capsule study.  Hildred Laser, MD 11/09/2016, 10:10 AM

## 2016-11-10 ENCOUNTER — Encounter (HOSPITAL_COMMUNITY): Payer: Self-pay | Admitting: Internal Medicine

## 2016-11-12 NOTE — Op Note (Signed)
Small Bowel [place] Capsule Study Procedure date:  11/09/2016  Referring Provider:  Fran Lowes, MD PCP:  Dr. [name], DO  Indication for procedure:  Patient is 66 year old Caucasian female with multiple medical problems including end-stage renal disease on hemodialysis who was evaluated last year for iron deficiency anemia and chronic GI bleed. She underwent EGD and a colonoscopy. She returns with anemia and heme positive stool. She is undergoing small bowel given capsule study to complete her workup and hopefully find a source of GI blood loss.    Findings:   Patient was able to swallow given without any difficulty. Study duration 7 hours 54 minutes and 8 seconds. Gastric mucosa with erythema edema and to ileus covered with specks of blood or coffee-ground. No active bleeding noted. Diffuse pigmentation noted to do dental mucosa felt to be due to hemosiderin. This finding was also confirmed on EGD with biopsy in July 2017. No other small bowel lesions noted.  First Gastric image:  43 sec First Duodenal image: 4 min and 50 sec First Ileo-Cecal Valve image: 2 hrs 17 min and 48 sec First Cecal image: 2 hrs 18 min and 33 sec Gastric Passage time: 4 min Small Bowel Passage time:  2 hrs and 13 min  Summary & Recommendations:  Gastritis with specks of  blood and coffee-ground material coating the gastric mucosa. Nonspecific finding of extensive hemosiderin pigmentation of duodenal mucosa. No ulcers or AV malformations noted involving small bowel mucosa. [name] could possibly be losing blood from her upper GI tract. If she is having melena would consider another EGD.  Patient called twice but unable to contact her.

## 2016-11-30 ENCOUNTER — Telehealth (INDEPENDENT_AMBULATORY_CARE_PROVIDER_SITE_OTHER): Payer: Self-pay | Admitting: Internal Medicine

## 2016-11-30 NOTE — Telephone Encounter (Signed)
Gae Bon, patient's sister called to check on the results of her sisters capsule study.  She stated that the patient had this done 3 weeks ago and they've not heard anything.  Ramona is listed on the patients DPR and she states she has POA of the patient.  [phone]

## 2016-12-01 NOTE — Telephone Encounter (Signed)
Dr. Laural Golden has called the patient and has been unable to talk with her. She has been in dialysis, or eating.  Summary & Recommendations:  Gastritis with specks of  blood and coffee-ground material coating the gastric mucosa. Nonspecific finding of extensive hemosiderin pigmentation of duodenal mucosa. No ulcers or AV malformations noted involving small bowel mucosa. [name] could possibly be losing blood from her upper GI tract. If she is having melena would consider another EGD.  Patient called twice but unable to contact her.

## 2016-12-02 NOTE — Telephone Encounter (Signed)
Ramona ,patient's sister was called and told the results and the recommendation of Dr.Rehman. She states that she will check with her sister and those taking care of her to see if this is still going on. She also ask that all call be made to her as her sister,Talli cannot remember ect.  She also states that Dr.Bulter is still practicing and is in [facility] she will provide [place] a number so that we can be sure that the patient's reports are sent to her.

## 2016-12-17 ENCOUNTER — Other Ambulatory Visit: Payer: Self-pay | Admitting: Vascular Surgery

## 2016-12-17 DIAGNOSIS — M79605 Pain in left leg: Secondary | ICD-10-CM

## 2016-12-30 ENCOUNTER — Encounter (INDEPENDENT_AMBULATORY_CARE_PROVIDER_SITE_OTHER): Payer: Self-pay | Admitting: Internal Medicine

## 2016-12-30 ENCOUNTER — Encounter (INDEPENDENT_AMBULATORY_CARE_PROVIDER_SITE_OTHER): Payer: Self-pay

## 2016-12-30 ENCOUNTER — Other Ambulatory Visit (INDEPENDENT_AMBULATORY_CARE_PROVIDER_SITE_OTHER): Payer: Self-pay | Admitting: *Deleted

## 2016-12-30 ENCOUNTER — Ambulatory Visit (INDEPENDENT_AMBULATORY_CARE_PROVIDER_SITE_OTHER): Payer: Medicare Other | Admitting: Internal Medicine

## 2016-12-30 ENCOUNTER — Encounter: Payer: Self-pay | Admitting: Vascular Surgery

## 2016-12-30 VITALS — BP 140/74 | HR 76 | Temp 98.4°F | Ht 64.0 in | Wt 203.3 lb

## 2016-12-30 DIAGNOSIS — K922 Gastrointestinal hemorrhage, unspecified: Secondary | ICD-10-CM

## 2016-12-30 DIAGNOSIS — D508 Other iron deficiency anemias: Secondary | ICD-10-CM

## 2016-12-30 LAB — CBC WITH DIFFERENTIAL/PLATELET
BASOS ABS: 0 {cells}/uL (ref 0–200)
Basophils Relative: 0 %
Eosinophils Absolute: 112 cells/uL (ref 15–500)
Eosinophils Relative: 2 %
HEMATOCRIT: 29.1 % — AB (ref 35.0–45.0)
HEMOGLOBIN: 9.6 g/dL — AB (ref 11.7–15.5)
LYMPHS ABS: 1680 {cells}/uL (ref 850–3900)
Lymphocytes Relative: 30 %
MCH: 33.2 pg — AB (ref 27.0–33.0)
MCHC: 33 g/dL (ref 32.0–36.0)
MCV: 100.7 fL — AB (ref 80.0–100.0)
MONO ABS: 448 {cells}/uL (ref 200–950)
MPV: 8.8 fL (ref 7.5–12.5)
Monocytes Relative: 8 %
NEUTROS PCT: 60 %
Neutro Abs: 3360 cells/uL (ref 1500–7800)
Platelets: 150 10*3/uL (ref 140–400)
RBC: 2.89 MIL/uL — ABNORMAL LOW (ref 3.80–5.10)
RDW: 17 % — ABNORMAL HIGH (ref 11.0–15.0)
WBC: 5.6 10*3/uL (ref 3.8–10.8)

## 2016-12-30 NOTE — Patient Instructions (Signed)
Labs today. OV in 6 months.  

## 2016-12-30 NOTE — Progress Notes (Addendum)
Subjective:    Patient ID: Tanya Lucero, female    DOB: Feb 22, 1951, 66 y.o.   MRN: [id number]  HPI Here today for f/u. Hx of IDA and chronic GI bleed.  Underwent an EGD and colonoscopy in 2017. She tells me she is doing okay. She feels tired all the time. Underwent a Given Capsule last month.  She denies rectal bleeding. She says her stools are black from the iron. She has a BM daily. Appetite is good. No weight loss.  No abdominal pain. Lives in an assisted living facility in [place].      Hx of CKD and takes dialysis on M-W-F. Given Capsule 11/09/2016 Gastritis with specks of  blood and coffee-ground material coating the gastric mucosa. Nonspecific finding of extensive hemosiderin pigmentation of duodenal mucosa. No ulcers or AV malformations noted involving small bowel mucosa. [name] could possibly be losing blood from her upper GI tract. If she is having melena would consider another EGD.   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 Heme positive stool.  Impression:               - Normal esophagus.                           - Z-line regular, 40 cm from the incisors.                           - Nodular mucosa in the gastric antrum. 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 Biopsied.                           - Normal duodenal bulb.                           - Mucosal pigmentation in the duodenum. Biopsied.  12/11/2015 Colonoscopy: Guaiac positive stool:  Impression:               - Two 4 to 5 mm polyps at the recto-sigmoid colon                            and in the cecum, removed with a cold snare.                            Resected and retrieved.                           - One 12 mm polyp at the hepatic flexure, removed                            with a hot snare. Resected and retrieved.                           - One 8 mm polyp in the transverse colon, removed                            with a hot snare.  Resected and  retrieved.                           - Diverticulosis in the sigmoid colon.                           - External hemorrhoids.  He had 4 polyps removed. 2 are tubular adenomas. Next colonoscopy in 5 years. Hx o   Review of Systems Past Medical History:  Diagnosis Date  . Anemia   . Anxiety   . Arthritis   . Chronic kidney disease    neuphrotic syndrome-05/2013  . Constipation   . Decreased vision    left eye  . Depression   . Diabetes mellitus    Type 2  . Diabetic neuropathy ([place])   . Diverticulitis   . Dry skin   . GERD (gastroesophageal reflux disease)   . Gout   . Headache   . Hypertension   . Neuropathy   . Shortness of breath dyspnea   . Skin cancer     Past Surgical History:  Procedure Laterality Date  . ABDOMINAL HYSTERECTOMY    . ADENOIDECTOMY    . APPENDECTOMY     taken out with hysterectomy  . AV FISTULA PLACEMENT Right 09/13/2014   Procedure: Creation of Right Arm RADIOCEPHALIC ARTERIOVENOUS (AV) FISTULA ;  Surgeon: [name], MD;  Location: [facility];  Service: Vascular;  Laterality: Right;  . BACK SURGERY     lumbar -   . CATARACT EXTRACTION W/PHACO Left 07/02/2013   Procedure: CATARACT EXTRACTION PHACO AND INTRAOCULAR LENS PLACEMENT (IOC);  Surgeon: [name], MD;  [facility];  Service: Ophthalmology;  Laterality: Left;  CDE:  6.46  . CATARACT EXTRACTION W/PHACO Right 07/12/2013   Procedure: CATARACT EXTRACTION PHACO AND INTRAOCULAR LENS PLACEMENT (IOC);  Surgeon: [name], MD;  [facility]: [facility];  Service: Ophthalmology;  Laterality: Right;  CDE:12.48  . COLONOSCOPY N/A 12/11/2015   Procedure: COLONOSCOPY;  Surgeon: [name], MD;  [facility];  Service: Endoscopy;  Laterality: N/A;  1:55  . ESOPHAGOGASTRODUODENOSCOPY N/A 03/11/2016   Procedure: ESOPHAGOGASTRODUODENOSCOPY (EGD);  Surgeon: [name], MD;  [facility];  Service: Endoscopy;  Laterality: N/A;  1230 - Last pt of the day-Dr. Laural Golden going to office to see  pt's  . FOOT SURGERY Right    x2-heel spur   . GIVENS CAPSULE STUDY N/A 11/09/2016   Procedure: GIVENS CAPSULE STUDY;  Surgeon: [name], MD;  [facility];  Service: Endoscopy;  Laterality: N/A;  7:30  . REVISON OF ARTERIOVENOUS FISTULA Right 01/05/2016   Procedure: REVISON OF ARTERIOVENOUS FISTULA- right forearm;  Surgeon: [name], MD;  Location: [facility];  Service: Vascular;  Laterality: Right;  . SKIN CANCER EXCISION     forehead  . TONSILLECTOMY      Allergies  Allergen Reactions  . Penicillins Rash    Current Outpatient Prescriptions  Medication Sig Dispense Refill  . acetaminophen (TYLENOL) 325 MG tablet Take 650 mg by mouth every 6 (six) hours as needed for headache.    . ALPRAZolam (XANAX) 0.5 MG tablet Take 0.5 mg by mouth 2 (two) times daily. Reported on 02/03/2016    . amLODipine (NORVASC) 10 MG tablet Take 10 mg by mouth daily.    Marland Kitchen b complex vitamins capsule Take 1 capsule by mouth daily.    . calcium  carbonate (OS-CAL) 600 MG TABS tablet Take 600 mg by mouth daily with breakfast.    . carvedilol (COREG) 6.25 MG tablet Take 6.25 mg by mouth 2 (two) times daily with a meal.    . cholecalciferol (VITAMIN D) 1000 units tablet Take 1,000 Units by mouth daily.    Marland Kitchen docusate sodium (COLACE) 100 MG capsule Take 100 mg by mouth 2 (two) times daily.    . Ferrous Sulfate (FEROSUL PO) Take by mouth.    . furosemide (LASIX) 20 MG tablet Take 20 mg by mouth as needed.     . gabapentin (NEURONTIN) 300 MG capsule Take 300 mg by mouth 3 (three) times daily.    Marland Kitchen glipiZIDE (GLUCOTROL) 5 MG tablet Take 5 mg by mouth 2 (two) times daily before a meal.     . hydrALAZINE (APRESOLINE) 50 MG tablet Take 25 mg by mouth 3 (three) times daily.   3  . insulin aspart (NOVOLOG) 100 UNIT/ML injection Inject 10 Units into the skin 3 (three) times daily before meals.    . insulin glargine (LANTUS) 100 UNIT/ML injection Inject 60 Units into the skin at bedtime.     . pantoprazole  (PROTONIX) 40 MG tablet Take 40 mg by mouth every morning.     . sevelamer carbonate (RENVELA) 800 MG tablet Take 800 mg by mouth 3 (three) times daily with meals.     No current facility-administered medications for this visit.         Objective:   Physical Exam Blood pressure 140/74, pulse 76, temperature 98.4 F (36.9 C), height 5\' 4"  (1.626 m), weight 203 lb 4.8 oz (92.2 kg). Alert and oriented. Skin warm and dry. Oral mucosa is moist.   . Sclera anicteric, conjunctivae is pink. Thyroid not enlarged. No cervical lymphadenopathy. Lungs clear. Heart regular rate and rhythm.  Abdomen is soft. Bowel sounds are positive. No hepatomegaly. No abdominal masses felt. No tenderness.  No edema to lower extremities.         Assessment & Plan:  Chronic GI bleed. Will get a CBC today. OV in 6 months.

## 2017-01-03 ENCOUNTER — Other Ambulatory Visit (INDEPENDENT_AMBULATORY_CARE_PROVIDER_SITE_OTHER): Payer: Self-pay | Admitting: *Deleted

## 2017-01-03 DIAGNOSIS — D500 Iron deficiency anemia secondary to blood loss (chronic): Secondary | ICD-10-CM

## 2017-01-03 DIAGNOSIS — K922 Gastrointestinal hemorrhage, unspecified: Secondary | ICD-10-CM

## 2017-01-06 ENCOUNTER — Ambulatory Visit (HOSPITAL_COMMUNITY)
Admission: RE | Admit: 2017-01-06 | Discharge: 2017-01-06 | Disposition: A | Discharge status: Home / Self Care | Payer: Medicare Other | Source: Ambulatory Visit | Attending: Vascular Surgery | Admitting: Vascular Surgery

## 2017-01-06 DIAGNOSIS — M79605 Pain in left leg: Secondary | ICD-10-CM | POA: Insufficient documentation

## 2017-01-11 ENCOUNTER — Ambulatory Visit (INDEPENDENT_AMBULATORY_CARE_PROVIDER_SITE_OTHER): Payer: Medicare Other | Admitting: Family

## 2017-01-11 ENCOUNTER — Encounter: Payer: Self-pay | Admitting: Family

## 2017-01-11 ENCOUNTER — Ambulatory Visit: Payer: Medicare Other | Admitting: Vascular Surgery

## 2017-01-11 VITALS — BP 119/65 | HR 80 | Temp 99.2°F | Resp 20 | Ht 63.0 in | Wt 205.0 lb

## 2017-01-11 DIAGNOSIS — N186 End stage renal disease: Secondary | ICD-10-CM

## 2017-01-11 DIAGNOSIS — I770 Arteriovenous fistula, acquired: Secondary | ICD-10-CM

## 2017-01-11 DIAGNOSIS — M79641 Pain in right hand: Secondary | ICD-10-CM | POA: Diagnosis not present

## 2017-01-11 DIAGNOSIS — Z992 Dependence on renal dialysis: Secondary | ICD-10-CM | POA: Diagnosis not present

## 2017-01-11 NOTE — Progress Notes (Signed)
CC: Problem with Dialysis Access  History of Present Illness  Tanya Lucero is a 66 y.o. (February 09, 1951) female who is s/p revision of her right forearm radiocephalic fistula on 09-07-15 by Dr. Donnetta Hutching.  She had a nice sized maturation but the vein was extremely tortuous and deep under the fat. She had the complete mobilization from the antecubital space to the wrist and then had the vein tunneled more laterally and reanastomosed. She was hospitalized with syncopal episode and continues to have some deterioration from an overall medical standpoint.   She is here today with a staff member of the nursing facility.  She returns today with c/o pain and numbness in right hand for about a month, worse while on hemodialysis.  She dialyzes M-W-F at [facility].   She also reports a herniated disc in her neck tha has caused tingling and numbness in her hands, and neuropathy in her feet for uncontrolled DM; states her serum glucose checks at the NH are 300's -400's, states adjustments were made to her insulin doses.   She is a resident of [facility] facility.   The patient is right hand dominant. This is her first HD access placement. She has been on HD for over a year.   Past Medical History:  Diagnosis Date  . Anemia   . Anxiety   . Arthritis   . Chronic kidney disease    neuphrotic syndrome-05/2013  . Constipation   . Decreased vision    left eye  . Depression   . Diabetes mellitus    Type 2  . Diabetic neuropathy ([place])   . Diverticulitis   . Dry skin   . GERD (gastroesophageal reflux disease)   . Gout   . Headache   . Hypertension   . Neuropathy   . Shortness of breath dyspnea   . Skin cancer     Social History Social History  Substance Use Topics  . Smoking status: Current Every Day Smoker    Packs/day: 0.25    Years: 40.00    Types: Cigarettes  . Smokeless tobacco: Never Used  . Alcohol use No    Family History Family History  Problem Relation Age of Onset  .  Multiple myeloma Mother   . Diabetes Mother   . Heart attack Father 48    Surgical History Past Surgical History:  Procedure Laterality Date  . ABDOMINAL HYSTERECTOMY    . ADENOIDECTOMY    . APPENDECTOMY     taken out with hysterectomy  . AV FISTULA PLACEMENT Right 09/13/2014   Procedure: Creation of Right Arm RADIOCEPHALIC ARTERIOVENOUS (AV) FISTULA ;  Surgeon: [name], MD;  Location: [facility];  Service: Vascular;  Laterality: Right;  . BACK SURGERY     lumbar -   . CATARACT EXTRACTION W/PHACO Left 07/02/2013   Procedure: CATARACT EXTRACTION PHACO AND INTRAOCULAR LENS PLACEMENT (IOC);  Surgeon: [name], MD;  [facility];  Service: Ophthalmology;  Laterality: Left;  CDE:  6.46  . CATARACT EXTRACTION W/PHACO Right 07/12/2013   Procedure: CATARACT EXTRACTION PHACO AND INTRAOCULAR LENS PLACEMENT (IOC);  Surgeon: [name], MD;  [facility];  Service: Ophthalmology;  Laterality: Right;  CDE:12.48  . COLONOSCOPY N/A 12/11/2015   Procedure: COLONOSCOPY;  Surgeon: [name], MD;  [facility];  Service: Endoscopy;  Laterality: N/A;  1:55  . ESOPHAGOGASTRODUODENOSCOPY N/A 03/11/2016   Procedure: ESOPHAGOGASTRODUODENOSCOPY (EGD);  Surgeon: [name], MD;  [facility];  Service: Endoscopy;  Laterality: N/A;  1230 - Last pt of the day-Dr. Laural Golden going to office to see pt's  . FOOT SURGERY Right    x2-heel spur   . GIVENS CAPSULE STUDY N/A 11/09/2016   Procedure: GIVENS CAPSULE STUDY;  Surgeon: [name], MD;  [facility];  Service: Endoscopy;  Laterality: N/A;  7:30  . REVISON OF ARTERIOVENOUS FISTULA Right 01/05/2016   Procedure: REVISON OF ARTERIOVENOUS FISTULA- right forearm;  Surgeon: [name], MD;  Location: [facility];  Service: Vascular;  Laterality: Right;  . SKIN CANCER EXCISION     forehead  . TONSILLECTOMY      Allergies  Allergen Reactions  . Penicillins Rash    Current Outpatient Prescriptions  Medication Sig Dispense  Refill  . acetaminophen (TYLENOL) 325 MG tablet Take 650 mg by mouth every 6 (six) hours as needed for headache.    . ALPRAZolam (XANAX) 0.5 MG tablet Take 0.5 mg by mouth 2 (two) times daily. Reported on 02/03/2016    . amLODipine (NORVASC) 10 MG tablet Take 10 mg by mouth daily.    Marland Kitchen b complex vitamins capsule Take 1 capsule by mouth daily.    . calcium carbonate (OS-CAL) 600 MG TABS tablet Take 600 mg by mouth daily with breakfast.    . carvedilol (COREG) 6.25 MG tablet Take 6.25 mg by mouth 2 (two) times daily with a meal.    . cholecalciferol (VITAMIN D) 1000 units tablet Take 1,000 Units by mouth daily.    Marland Kitchen docusate sodium (COLACE) 100 MG capsule Take 100 mg by mouth 2 (two) times daily.    . Ferrous Sulfate (FEROSUL PO) Take by mouth.    . furosemide (LASIX) 20 MG tablet Take 20 mg by mouth as needed.     . gabapentin (NEURONTIN) 300 MG capsule Take 300 mg by mouth 3 (three) times daily.    Marland Kitchen glipiZIDE (GLUCOTROL) 5 MG tablet Take 5 mg by mouth 2 (two) times daily before a meal.     . hydrALAZINE (APRESOLINE) 50 MG tablet Take 25 mg by mouth 3 (three) times daily.   3  . insulin aspart (NOVOLOG) 100 UNIT/ML injection Inject 10 Units into the skin 3 (three) times daily before meals.    . insulin glargine (LANTUS) 100 UNIT/ML injection Inject 60 Units into the skin at bedtime.     . pantoprazole (PROTONIX) 40 MG tablet Take 40 mg by mouth every morning.     . sevelamer carbonate (RENVELA) 800 MG tablet Take 800 mg by mouth 3 (three) times daily with meals.     No current facility-administered medications for this visit.      REVIEW OF SYSTEMS: see HPI for pertinent positives and negatives    PHYSICAL EXAMINATION:  Vitals:   01/11/17 1534  BP: 119/65  Pulse: 80  Resp: 20  Temp: 99.2 F (37.3 C)  TempSrc: Oral  SpO2: 94%  Weight: 205 lb (93 kg)  Height: '5\' 3"'  (1.6 m)   Body mass index is 36.31 kg/m.  General: The patient appears their stated age. Using rolling walker to  ambulate.   HEENT:  No gross abnormalities Pulmonary: Respirations are non-labored, CTAB, adequate air movment Abdomen: Soft and non-tender with normal pitched bowel sounds. Musculoskeletal: There are no major deformities.  M/S 5/5 in upper and lower extremities, bilateral hand grip 5/5.  Neurologic: No focal weakness or paresthesias are detected, equal sensation to light touch in both hands. Skin: There are no  ulcers or rashes noted. Psychiatric: The patient has normal affect. Cardiovascular: There is a regular rate and rhythm without significant murmur appreciated. Bilateral pedal pulses are palpable. Left forearm AV fistula with palpable thrill. Bilateral radial pulses are 2+ palpable.   Non-Invasive Vascular Imaging  right arm Access Duplex  (Date: 01/11/2017):   Diameters:  1.03 cm (vein outflow)  PSV:  524 c/s  Resting radial velocity of 90 cm/s, with compression 77 cm/s, suggestive of no steal syndrome.    Medical Decision Making  Tanya Lucero is a 66 y.o. female who is s/p revision of her right forearm radiocephalic fistula on 09-09-62 by Dr. Donnetta Hutching.  Right arm AV fistula duplex with no evidence for steal syndrome. 2+ palpable right radial pulse, palpable thrill at AV fistula.  Consider other differentials for right hand tingling and numbness: known HNP of c-spine, known DM neuropathy.   Follow up with [place] as needed.   Vishnu Moeller, Sharmon Leyden, RN, MSN, FNP-C [facility] Office: [phone]  01/11/2017, 3:41 PM  Clinic MD: Early

## 2017-03-02 ENCOUNTER — Encounter (INDEPENDENT_AMBULATORY_CARE_PROVIDER_SITE_OTHER): Payer: Self-pay | Admitting: *Deleted

## 2017-03-02 ENCOUNTER — Other Ambulatory Visit (INDEPENDENT_AMBULATORY_CARE_PROVIDER_SITE_OTHER): Payer: Self-pay | Admitting: *Deleted

## 2017-03-02 DIAGNOSIS — D508 Other iron deficiency anemias: Secondary | ICD-10-CM

## 2017-03-31 LAB — CBC
HCT: 29.6 % — ABNORMAL LOW (ref 35.0–45.0)
Hemoglobin: 10.1 g/dL — ABNORMAL LOW (ref 11.7–15.5)
MCH: 33.7 pg — AB (ref 27.0–33.0)
MCHC: 34.1 g/dL (ref 32.0–36.0)
MCV: 98.7 fL (ref 80.0–100.0)
MPV: 9 fL (ref 7.5–12.5)
PLATELETS: 174 10*3/uL (ref 140–400)
RBC: 3 MIL/uL — ABNORMAL LOW (ref 3.80–5.10)
RDW: 17.5 % — AB (ref 11.0–15.0)
WBC: 6.5 10*3/uL (ref 3.8–10.8)

## 2017-04-12 ENCOUNTER — Telehealth (INDEPENDENT_AMBULATORY_CARE_PROVIDER_SITE_OTHER): Payer: Self-pay | Admitting: Internal Medicine

## 2017-04-12 NOTE — Telephone Encounter (Signed)
err

## 2017-07-05 ENCOUNTER — Encounter (INDEPENDENT_AMBULATORY_CARE_PROVIDER_SITE_OTHER): Payer: Self-pay

## 2017-07-05 ENCOUNTER — Encounter (INDEPENDENT_AMBULATORY_CARE_PROVIDER_SITE_OTHER): Payer: Self-pay | Admitting: Internal Medicine

## 2017-07-05 ENCOUNTER — Ambulatory Visit (INDEPENDENT_AMBULATORY_CARE_PROVIDER_SITE_OTHER): Payer: Medicare Other | Admitting: Internal Medicine

## 2017-07-05 VITALS — BP 132/72 | HR 60 | Temp 97.6°F | Ht 64.0 in | Wt 212.7 lb

## 2017-07-05 DIAGNOSIS — D500 Iron deficiency anemia secondary to blood loss (chronic): Secondary | ICD-10-CM

## 2017-07-05 DIAGNOSIS — K922 Gastrointestinal hemorrhage, unspecified: Secondary | ICD-10-CM

## 2017-07-05 HISTORY — DX: Gastrointestinal hemorrhage, unspecified: K92.2

## 2017-07-05 LAB — CBC WITH DIFFERENTIAL/PLATELET
BASOS PCT: 0.3 %
Basophils Absolute: 20 cells/uL (ref 0–200)
Eosinophils Absolute: 99 cells/uL (ref 15–500)
Eosinophils Relative: 1.5 %
HCT: 23.9 % — ABNORMAL LOW (ref 35.0–45.0)
Hemoglobin: 8.2 g/dL — ABNORMAL LOW (ref 11.7–15.5)
Lymphs Abs: 1498 cells/uL (ref 850–3900)
MCH: 34.7 pg — AB (ref 27.0–33.0)
MCHC: 34.3 g/dL (ref 32.0–36.0)
MCV: 101.3 fL — ABNORMAL HIGH (ref 80.0–100.0)
MPV: 9.5 fL (ref 7.5–12.5)
Monocytes Relative: 7.3 %
NEUTROS ABS: 4501 {cells}/uL (ref 1500–7800)
Neutrophils Relative %: 68.2 %
PLATELETS: 188 10*3/uL (ref 140–400)
RBC: 2.36 10*6/uL — ABNORMAL LOW (ref 3.80–5.10)
RDW: 17.7 % — AB (ref 11.0–15.0)
TOTAL LYMPHOCYTE: 22.7 %
WBC: 6.6 10*3/uL (ref 3.8–10.8)
WBCMIX: 482 {cells}/uL (ref 200–950)

## 2017-07-05 NOTE — Progress Notes (Addendum)
Subjective:    Patient ID: Tanya Lucero, female    DOB: [birth year], 66 y.o.   MRN: [id number]  HPI Here today for f/u. Last seen in April of this year.  Ix of IDA and chronic GI bleed. Underwent a Given Capsule in March of this year which revealed:   Findings:   Patient was able to swallow given without any difficulty. Study duration 7 hours 54 minutes and 8 seconds. Gastric mucosa with erythema edema and to ileus covered with specks of blood or coffee-ground. No active bleeding noted. Diffuse pigmentation noted to do dental mucosa felt to be due to hemosiderin. This finding was also confirmed on EGD with biopsy in July 2017. No other small bowel lesions noted. Hemoglobin has remained stable. She tells me she is okay. Just doesn't feel good. She says dialysis drains her. She tells me her stools are black from the iron. She is eating 3 meals a day.  Appetite is good.  No exercising.  Lives in an assisted living in [place].       Hx of CKD and takes dialysis on M-W-F.   CBC Latest Ref Rng & Units 03/31/2017 12/30/2016 05/07/2016  WBC 3.8 - 10.8 K/uL 6.5 5.6 7.4  Hemoglobin 11.7 - 15.5 g/dL 10.1(L) 9.6(L) 9.4(L)  Hematocrit 35.0 - 45.0 % 29.6(L) 29.1(L) 27.1(L)  Platelets 140 - 400 K/uL 174 150 141(L)        Given Capsule 11/09/2016 Gastritis with specks of blood and coffee-ground material coating the gastric mucosa. Nonspecific finding of extensive hemosiderin pigmentationof duodenal mucosa. No ulcers or AV malformations noted involving small bowel mucosa. [name] could possibly be losing blood from her upper GI tract. If she is having melena would consider another EGD.   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 Heme positive stool.  Impression: - Normal esophagus. - Z-line regular, 40 cm from the incisors. - Nodular mucosa in the gastric antrum. 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 Biopsied. - Normal duodenal bulb. - Mucosal pigmentation in the duodenum. Biopsied.  12/11/2015 Colonoscopy: Guaiac positive stool:  Impression: - Two 4 to 5 mm polyps at the recto-sigmoid colon  and in the cecum, removed with a cold snare.  Resected and retrieved. - One 12 mm polyp at the hepatic flexure, removed  with a hot snare. Resected and retrieved. - One 8 mm polyp in the transverse colon, removed  with a hot snare. Resected and retrieved. - Diverticulosis in the sigmoid colon. - External hemorrhoids.  He had 4 polyps removed. 2 are tubular adenomas. Next colonoscopy in 5 years. Hx o  Review of Systems Past Medical History:  Diagnosis Date  . Anemia   . Anxiety   . Arthritis   . Chronic kidney disease    neuphrotic syndrome-05/2013  . Constipation   . Decreased vision    left eye  . Depression   . Diabetes mellitus    Type 2  . Diabetic neuropathy ([place])   . Diverticulitis   . Dry skin   . GERD (gastroesophageal reflux disease)   . GI bleed 07/05/2017  . Gout   . Headache   . Hypertension   . Neuropathy   . Shortness of breath dyspnea   . Skin cancer     Past Surgical History:  Procedure Laterality Date  . ABDOMINAL HYSTERECTOMY    . ADENOIDECTOMY    . APPENDECTOMY     taken out with hysterectomy  . AV FISTULA PLACEMENT Right 09/13/2014  Procedure: Creation of Right Arm RADIOCEPHALIC ARTERIOVENOUS (AV) FISTULA ;  Surgeon: [name], MD;  Location: [facility];  Service: Vascular;  Laterality: Right;  . BACK SURGERY     lumbar -   . CATARACT EXTRACTION W/PHACO Left 07/02/2013   Procedure: CATARACT EXTRACTION PHACO AND INTRAOCULAR LENS PLACEMENT (IOC);  Surgeon:  [name], MD;  [facility];  Service: Ophthalmology;  Laterality: Left;  CDE:  6.46  . CATARACT EXTRACTION W/PHACO Right 07/12/2013   Procedure: CATARACT EXTRACTION PHACO AND INTRAOCULAR LENS PLACEMENT (IOC);  Surgeon: [name], MD;  [facility];  Service: Ophthalmology;  Laterality: Right;  CDE:12.48  . COLONOSCOPY N/A 12/11/2015   Procedure: COLONOSCOPY;  Surgeon: [name], MD;  [facility];  Service: Endoscopy;  Laterality: N/A;  1:55  . ESOPHAGOGASTRODUODENOSCOPY N/A 03/11/2016   Procedure: ESOPHAGOGASTRODUODENOSCOPY (EGD);  Surgeon: [name], MD;  [facility];  Service: Endoscopy;  Laterality: N/A;  1230 - Last pt of the day-Dr. Laural Golden going to office to see pt's  . FOOT SURGERY Right    x2-heel spur   . GIVENS CAPSULE STUDY N/A 11/09/2016   Procedure: GIVENS CAPSULE STUDY;  Surgeon: [name], MD;  [facility];  Service: Endoscopy;  Laterality: N/A;  7:30  . REVISON OF ARTERIOVENOUS FISTULA Right 01/05/2016   Procedure: REVISON OF ARTERIOVENOUS FISTULA- right forearm;  Surgeon: [name], MD;  Location: [facility];  Service: Vascular;  Laterality: Right;  . SKIN CANCER EXCISION     forehead  . TONSILLECTOMY      Allergies  Allergen Reactions  . Penicillins Rash    Current Outpatient Prescriptions on File Prior to Visit  Medication Sig Dispense Refill  . acetaminophen (TYLENOL) 325 MG tablet Take 650 mg by mouth every 6 (six) hours as needed for headache.    . ALPRAZolam (XANAX) 0.5 MG tablet Take 0.5 mg by mouth 2 (two) times daily. Reported on 02/03/2016    . amLODipine (NORVASC) 10 MG tablet Take 10 mg by mouth daily.    Marland Kitchen b complex vitamins capsule Take 1 capsule by mouth daily.    . carvedilol (COREG) 6.25 MG tablet Take 6.25 mg by mouth 2 (two) times daily with a meal.    . cholecalciferol (VITAMIN D) 1000 units tablet Take 1,000 Units by mouth daily.    Marland Kitchen docusate sodium (COLACE) 100 MG capsule Take 100 mg by mouth 2  (two) times daily.    . Ferrous Sulfate (FEROSUL PO) Take by mouth.    . furosemide (LASIX) 20 MG tablet Take 20 mg by mouth as needed.     . gabapentin (NEURONTIN) 300 MG capsule Take 300 mg by mouth 3 (three) times daily.    Marland Kitchen glipiZIDE (GLUCOTROL) 5 MG tablet Take 5 mg by mouth 2 (two) times daily before a meal.     . hydrALAZINE (APRESOLINE) 50 MG tablet Take 25 mg by mouth 3 (three) times daily.   3  . insulin glargine (LANTUS) 100 UNIT/ML injection Inject 55 Units into the skin at bedtime.     . pantoprazole (PROTONIX) 40 MG tablet Take 40 mg by mouth every morning.     . sevelamer carbonate (RENVELA) 800 MG tablet Take 800 mg by mouth 3 (three) times daily with meals.     No current facility-administered medications on file prior to visit.         Objective:   Physical Exam Blood pressure 132/72, pulse 60,  temperature 97.6 F (36.4 C), height 5\' 4"  (1.626 m), weight 212 lb 11.2 oz (96.5 kg). Alert and oriented. Skin warm and dry. Oral mucosa is moist.   . Sclera anicteric, conjunctivae is pink. Thyroid not enlarged. No cervical lymphadenopathy. Lungs clear. Heart regular rate and rhythm.  Abdomen is soft. Bowel sounds are positive. No hepatomegaly. No abdominal masses felt. No tenderness.  No edema to lower extremities.           Assessment & Plan:  IDA. GI bleed. CBC today. OV in 6 months.  3 stool cards home with caregiver who is present in room.

## 2017-07-05 NOTE — Patient Instructions (Signed)
CBC. OV in 6 months.

## 2017-07-14 ENCOUNTER — Telehealth (INDEPENDENT_AMBULATORY_CARE_PROVIDER_SITE_OTHER): Payer: Self-pay | Admitting: *Deleted

## 2017-07-14 NOTE — Telephone Encounter (Signed)
   Diagnosis:    Result(s)   Card 1: Positive    Card 2: Positive   Card 3: Positive    Completed by: Porter Nakama,LPN   HEMOCCULT SENSA DEVELOPER: LOT#:  [id number] EXPIRATION DATE: 2020/10   HEMOCCULT SENSA CARD:  LOT#:  [id number] EXPIRATION DATE: 05-20   CARD CONTROL RESULTS:  POSITIVE: Positive NEGATIVE: Negative   ADDITIONAL COMMENTS: Forwarded to Lelon Perla

## 2017-07-18 ENCOUNTER — Telehealth (INDEPENDENT_AMBULATORY_CARE_PROVIDER_SITE_OTHER): Payer: Self-pay | Admitting: Internal Medicine

## 2017-07-18 ENCOUNTER — Other Ambulatory Visit (INDEPENDENT_AMBULATORY_CARE_PROVIDER_SITE_OTHER): Payer: Self-pay | Admitting: Internal Medicine

## 2017-07-18 DIAGNOSIS — D500 Iron deficiency anemia secondary to blood loss (chronic): Secondary | ICD-10-CM

## 2017-07-18 DIAGNOSIS — R195 Other fecal abnormalities: Secondary | ICD-10-CM

## 2017-07-18 NOTE — Progress Notes (Signed)
Ann, EGD. I discussed with Dr Laural Golden. Needs to be soon. IDA and guaiac positive stool

## 2017-07-18 NOTE — Telephone Encounter (Signed)
Will discuss with Dr.Rehman. 

## 2017-07-18 NOTE — Telephone Encounter (Signed)
Tanya Lucero, EGD. Needs to be soon. I discussed with Dr Laural Golden. Anemia and guaiac positive stool

## 2017-07-18 NOTE — Telephone Encounter (Signed)
err

## 2017-07-19 ENCOUNTER — Encounter (INDEPENDENT_AMBULATORY_CARE_PROVIDER_SITE_OTHER): Payer: Self-pay | Admitting: *Deleted

## 2017-07-19 DIAGNOSIS — R195 Other fecal abnormalities: Secondary | ICD-10-CM | POA: Insufficient documentation

## 2017-07-19 NOTE — Telephone Encounter (Signed)
EGD sch'd 07/21/17 at 100 (1200), spoke to [facility], instructions faxed to [facility] @ [phone]

## 2017-07-21 ENCOUNTER — Encounter (HOSPITAL_COMMUNITY)
Admission: RE | Disposition: A | Discharge status: Home / Self Care | Payer: Self-pay | Source: Ambulatory Visit | Attending: Internal Medicine

## 2017-07-21 ENCOUNTER — Other Ambulatory Visit: Payer: Self-pay

## 2017-07-21 ENCOUNTER — Ambulatory Visit (HOSPITAL_COMMUNITY)
Admission: RE | Admit: 2017-07-21 | Discharge: 2017-07-21 | Disposition: A | Discharge status: Home / Self Care | Payer: Medicare Other | Source: Ambulatory Visit | Attending: Internal Medicine | Admitting: Internal Medicine

## 2017-07-21 ENCOUNTER — Encounter (HOSPITAL_COMMUNITY): Payer: Self-pay | Admitting: *Deleted

## 2017-07-21 DIAGNOSIS — K31819 Angiodysplasia of stomach and duodenum without bleeding: Secondary | ICD-10-CM | POA: Insufficient documentation

## 2017-07-21 DIAGNOSIS — Z8249 Family history of ischemic heart disease and other diseases of the circulatory system: Secondary | ICD-10-CM | POA: Insufficient documentation

## 2017-07-21 DIAGNOSIS — Z794 Long term (current) use of insulin: Secondary | ICD-10-CM | POA: Insufficient documentation

## 2017-07-21 DIAGNOSIS — K3189 Other diseases of stomach and duodenum: Secondary | ICD-10-CM | POA: Insufficient documentation

## 2017-07-21 DIAGNOSIS — F17210 Nicotine dependence, cigarettes, uncomplicated: Secondary | ICD-10-CM | POA: Diagnosis not present

## 2017-07-21 DIAGNOSIS — F329 Major depressive disorder, single episode, unspecified: Secondary | ICD-10-CM | POA: Insufficient documentation

## 2017-07-21 DIAGNOSIS — Z85828 Personal history of other malignant neoplasm of skin: Secondary | ICD-10-CM | POA: Insufficient documentation

## 2017-07-21 DIAGNOSIS — E1122 Type 2 diabetes mellitus with diabetic chronic kidney disease: Secondary | ICD-10-CM | POA: Diagnosis not present

## 2017-07-21 DIAGNOSIS — M1990 Unspecified osteoarthritis, unspecified site: Secondary | ICD-10-CM | POA: Insufficient documentation

## 2017-07-21 DIAGNOSIS — K219 Gastro-esophageal reflux disease without esophagitis: Secondary | ICD-10-CM | POA: Insufficient documentation

## 2017-07-21 DIAGNOSIS — Z79899 Other long term (current) drug therapy: Secondary | ICD-10-CM | POA: Insufficient documentation

## 2017-07-21 DIAGNOSIS — D130 Benign neoplasm of esophagus: Secondary | ICD-10-CM | POA: Insufficient documentation

## 2017-07-21 DIAGNOSIS — K317 Polyp of stomach and duodenum: Secondary | ICD-10-CM | POA: Diagnosis not present

## 2017-07-21 DIAGNOSIS — Z90710 Acquired absence of both cervix and uterus: Secondary | ICD-10-CM | POA: Diagnosis not present

## 2017-07-21 DIAGNOSIS — K921 Melena: Secondary | ICD-10-CM | POA: Insufficient documentation

## 2017-07-21 DIAGNOSIS — M109 Gout, unspecified: Secondary | ICD-10-CM | POA: Insufficient documentation

## 2017-07-21 DIAGNOSIS — D500 Iron deficiency anemia secondary to blood loss (chronic): Secondary | ICD-10-CM | POA: Insufficient documentation

## 2017-07-21 DIAGNOSIS — N186 End stage renal disease: Secondary | ICD-10-CM | POA: Insufficient documentation

## 2017-07-21 DIAGNOSIS — I120 Hypertensive chronic kidney disease with stage 5 chronic kidney disease or end stage renal disease: Secondary | ICD-10-CM | POA: Insufficient documentation

## 2017-07-21 DIAGNOSIS — Z880 Allergy status to penicillin: Secondary | ICD-10-CM | POA: Insufficient documentation

## 2017-07-21 DIAGNOSIS — K228 Other specified diseases of esophagus: Secondary | ICD-10-CM | POA: Diagnosis not present

## 2017-07-21 DIAGNOSIS — R195 Other fecal abnormalities: Secondary | ICD-10-CM | POA: Insufficient documentation

## 2017-07-21 DIAGNOSIS — F419 Anxiety disorder, unspecified: Secondary | ICD-10-CM | POA: Insufficient documentation

## 2017-07-21 DIAGNOSIS — Z8719 Personal history of other diseases of the digestive system: Secondary | ICD-10-CM | POA: Diagnosis not present

## 2017-07-21 DIAGNOSIS — K766 Portal hypertension: Secondary | ICD-10-CM | POA: Diagnosis not present

## 2017-07-21 HISTORY — PX: ESOPHAGOGASTRODUODENOSCOPY: SHX5428

## 2017-07-21 LAB — GLUCOSE, CAPILLARY
Glucose-Capillary: 113 mg/dL — ABNORMAL HIGH (ref 65–99)
Glucose-Capillary: 53 mg/dL — ABNORMAL LOW (ref 65–99)
Glucose-Capillary: 104 mg/dL — ABNORMAL HIGH (ref 65–99)
Glucose-Capillary: 67 mg/dL (ref 65–99)

## 2017-07-21 SURGERY — EGD (ESOPHAGOGASTRODUODENOSCOPY)
Anesthesia: Moderate Sedation

## 2017-07-21 MED ORDER — FENTANYL CITRATE (PF) 100 MCG/2ML IJ SOLN
INTRAMUSCULAR | Status: AC
  Filled 2017-07-21: qty 2

## 2017-07-21 MED ORDER — DEXTROSE 50 % IV SOLN
INTRAVENOUS | Status: AC
  Filled 2017-07-21: qty 50

## 2017-07-21 MED ORDER — MIDAZOLAM HCL 5 MG/5ML IJ SOLN
INTRAMUSCULAR | Status: AC
  Filled 2017-07-21: qty 10

## 2017-07-21 MED ORDER — STERILE WATER FOR IRRIGATION IR SOLN
Status: DC | PRN
  Administered 2017-07-21: 13:00:00

## 2017-07-21 MED ORDER — SODIUM CHLORIDE 0.9 % IV SOLN
INTRAVENOUS | Status: DC
  Administered 2017-07-21: 13:00:00 via INTRAVENOUS

## 2017-07-21 MED ORDER — FENTANYL CITRATE (PF) 100 MCG/2ML IJ SOLN
INTRAMUSCULAR | Status: DC | PRN
  Administered 2017-07-21 (×2): 25 ug via INTRAVENOUS

## 2017-07-21 MED ORDER — MEPERIDINE HCL 50 MG/ML IJ SOLN
INTRAMUSCULAR | Status: AC
  Filled 2017-07-21: qty 1

## 2017-07-21 MED ORDER — LIDOCAINE VISCOUS 2 % MT SOLN
OROMUCOSAL | Status: DC | PRN
  Administered 2017-07-21: 6 mL via OROMUCOSAL

## 2017-07-21 MED ORDER — MIDAZOLAM HCL 5 MG/5ML IJ SOLN
INTRAMUSCULAR | Status: DC | PRN
  Administered 2017-07-21: 2 mg via INTRAVENOUS
  Administered 2017-07-21: 1 mg via INTRAVENOUS

## 2017-07-21 MED ORDER — LIDOCAINE VISCOUS 2 % MT SOLN
OROMUCOSAL | Status: AC
  Filled 2017-07-21: qty 15

## 2017-07-21 MED ORDER — DEXTROSE 50 % IV SOLN
INTRAVENOUS | Status: DC | PRN
  Administered 2017-07-21: 12.5 g via INTRAVENOUS

## 2017-07-21 NOTE — Op Note (Signed)
[facility] Patient Name: Tanya Lucero Procedure Date: 07/21/2017 12:31 PM MRN: [id number] Date of Birth: 05/11/1951 Attending MD: Hildred Laser , MD CSN: [id number] Age: 66 Admit Type: Outpatient Procedure:                Upper GI endoscopy Indications:              Iron deficiency anemia secondary to chronic blood                            loss, Heme positive stool, Melena Providers:                Hildred Laser, MD, Hinton Rao, RN, Zoila Shutter,                            Technologist Referring MD:             [name], DO and Cornell Barman, MD Medicines:                Lidocaine spray, Fentanyl 50 micrograms IV,                            Midazolam 3 mg IV Complications:            No immediate complications. Estimated Blood Loss:     Estimated blood loss was minimal. Procedure:                Pre-Anesthesia Assessment:                           - Prior to the procedure, a History and Physical                            was performed, and patient medications and                            allergies were reviewed. The patient's tolerance of                            previous anesthesia was also reviewed. The risks                            and benefits of the procedure and the sedation                            options and risks were discussed with the patient.                            All questions were answered, and informed consent                            was obtained. Prior Anticoagulants: The patient has                            taken no previous anticoagulant or antiplatelet  agents. ASA Grade Assessment: III - A patient with                            severe systemic disease. After reviewing the risks                            and benefits, the patient was deemed in                            satisfactory condition to undergo the procedure.                           After obtaining informed consent, the endoscope was                             passed under direct vision. Throughout the                            procedure, the patient's blood pressure, pulse, and                            oxygen saturations were monitored continuously. The                            [id number]-[id number] ([id number]) scope was introduced through the                            mouth, and advanced to the second part of duodenum.                            The upper GI endoscopy was accomplished without                            difficulty. The patient tolerated the procedure                            well. Scope In: 1:24:04 PM Scope Out: 1:33:07 PM Total Procedure Duration: 0 hours 9 minutes 3 seconds  Findings:      A single 3 mm polyp was found 28 cm from the incisors. Biopsies were       taken with a cold forceps for histology.      The Z-line was irregular and was found 34 cm from the incisors.      Mild portal hypertensive gastropathy was found in the gastric fundus.      Mild gastric antral vascular ectasia without bleeding was present in the       gastric antrum and in the prepyloric region of the stomach.      A few small sessile polyps were found in the gastric antrum.      The cardia, gastric body and pylorus were normal.      The duodenal bulb and second portion of the duodenum were normal. Impression:               - Esophageal polyp(s) were found. Biopsied.                           -  Z-line irregular, 34 cm from the incisors.                           - Portal hypertensive gastropathy.                           - Gastric antral vascular ectasia without bleeding.                           - Two gastric polyps.                           - Normal cardia, gastric body and pylorus.                           - Normal duodenal bulb and second portion of the                            duodenum. Moderate Sedation:      Moderate (conscious) sedation was administered by the endoscopy nurse       and supervised by the  endoscopist. The following parameters were       monitored: oxygen saturation, heart rate, blood pressure, CO2       capnography and response to care. Total physician intraservice time was       14 minutes. Recommendation:           - Patient has a contact number available for                            emergencies. The signs and symptoms of potential                            delayed complications were discussed with the                            patient. Return to normal activities tomorrow.                            Written discharge instructions were provided to the                            patient.                           - Resume previous diet today.                           - Continue present medications.                           - Await pathology results.                           - Hold po iron for now.                           - Serum iron,TIBC and  Ferritin at the time of                            Dialysis, Procedure Code(s):        --- Professional ---                           [phone], Esophagogastroduodenoscopy, flexible,                            transoral; with biopsy, single or multiple                           99152, Moderate sedation services provided by the                            same physician or other qualified health care                            professional performing the diagnostic or                            therapeutic service that the sedation supports,                            requiring the presence of an independent trained                            observer to assist in the monitoring of the                            patient's level of consciousness and physiological                            status; initial 15 minutes of intraservice time,                            patient age 10 years or older Diagnosis Code(s):        --- Professional ---                           K22.8, Other specified diseases of esophagus                            K76.6, Portal hypertension                           K31.89, Other diseases of stomach and duodenum                           K31.819, Angiodysplasia of stomach and duodenum                            without bleeding  K31.7, Polyp of stomach and duodenum                           D50.0, Iron deficiency anemia secondary to blood                            loss (chronic)                           R19.5, Other fecal abnormalities                           K92.1, Melena (includes Hematochezia) CPT copyright 2016 American Medical Association. All rights reserved. The codes documented in this report are preliminary and upon coder review may  be revised to meet current compliance requirements. Hildred Laser, MD Hildred Laser, MD 07/21/2017 1:49:16 PM This report has been signed electronically. Number of Addenda: 0

## 2017-07-21 NOTE — Discharge Instructions (Addendum)
Gastric Polyps A gastric polyp, also called a stomach polyp, is a growth on the lining of the stomach. Most polyps are not dangerous, but some can be harmful because of their size, location, or type. Polyps that can become harmful include:  Large polyps. These can turn into sores (ulcers). Ulcers can lead to stomach bleeding.  Polyps that block food from moving from the stomach to the small intestine (gastric outlet obstruction).  A type of polyp called an adenoma. This type of polyp can become cancerous.  What are the causes? Gastric polyps form when the lining of the stomach gets inflamed or damaged. Stomach inflammation and damage may be caused by:  A long-lasting stomach condition, such as gastritis.  Certain medicines used to reduce stomach acid.  An inherited condition called familial adenomatous polyposis.  What are the signs or symptoms? Usually, this condition does not cause any symptoms. If you do have symptoms, they may include:  Pain or tenderness in the abdomen.  Nausea.  Trouble eating or swallowing.  Blood in the stool.  Anemia.  How is this diagnosed? Gastric polyps are diagnosed with:  A medical procedure called endoscopy.  A lab test in which a part of the polyp is examined. This test is done with a sample of polyp tissue (biopsy) taken during an endoscopy.  How is this treated? Treatment depends on the type, location, and size of the polyps. Treatment may involve:  Having the polyps checked regularly with an endoscopy.  Having the polyps removed with an endoscopy. This may be done if the polyps are harmful or can become harmful. Removing a polyp often prevents problems from developing.  Having the polyps removed with a surgery called a partial gastrectomy. This may be done in rare cases to remove very large polyps.  Treating the underlying condition that caused the polyps.  Follow these instructions at home:  Take over-the-counter and prescription  medicines only as told by your health care provider.  Keep all follow-up visits as told by your health care provider. This is important. Contact a health care provider if:  You develop new symptoms.  Your symptoms get worse. Get help right away if:  You vomit blood.  You have severe abdominal pain.  You cannot eat or drink.  You have blood in your stool. This information is not intended to replace advice given to you by your health care provider. Make sure you discuss any questions you have with your health care provider. Document Released: 08/09/2012 Document Revised: 01/12/2016 Document Reviewed: 09/07/2015 Elsevier Interactive Patient Education  2018 [employer]. Esophagogastroduodenoscopy Esophagogastroduodenoscopy (EGD) is a procedure to examine the lining of the esophagus, stomach, and first part of the small intestine (duodenum). This procedure is done to check for problems such as inflammation, bleeding, ulcers, or growths. During this procedure, a long, flexible, lighted tube with a camera attached (endoscope) is inserted down the throat. Tell a health care provider about:  Any allergies you have.  All medicines you are taking, including vitamins, herbs, eye drops, creams, and over-the-counter medicines.  Any problems you or family members have had with anesthetic medicines.  Any blood disorders you have.  Any surgeries you have had.  Any medical conditions you have.  Whether you are pregnant or may be pregnant. What are the risks? Generally, this is a safe procedure. However, problems may occur, including:  Infection.  Bleeding.  A tear (perforation) in the esophagus, stomach, or duodenum.  Trouble breathing.  Excessive sweating.  Spasms  of the larynx.  A slowed heartbeat.  Low blood pressure.  What happens before the procedure?  Follow instructions from your health care provider about eating or drinking restrictions.  Ask your health care  provider about: ? Changing or stopping your regular medicines. This is especially important if you are taking diabetes medicines or blood thinners. ? Taking medicines such as aspirin and ibuprofen. These medicines can thin your blood. Do not take these medicines before your procedure if your health care provider instructs you not to.  Plan to have someone take you home after the procedure.  If you wear dentures, be ready to remove them before the procedure. What happens during the procedure?  To reduce your risk of infection, your health care team will wash or sanitize their hands.  An IV tube will be put in a vein in your hand or arm. You will get medicines and fluids through this tube.  You will be given one or more of the following: ? A medicine to help you relax (sedative). ? A medicine to numb the area (local anesthetic). This medicine may be sprayed into your throat. It will make you feel more comfortable and keep you from gagging or coughing during the procedure. ? A medicine for pain.  A mouth guard may be placed in your mouth to protect your teeth and to keep you from biting on the endoscope.  You will be asked to lie on your left side.  The endoscope will be lowered down your throat into your esophagus, stomach, and duodenum.  Air will be put into the endoscope. This will help your health care provider see better.  The lining of your esophagus, stomach, and duodenum will be examined.  Your health care provider may: ? Take a tissue sample so it can be looked at in a lab (biopsy). ? Remove growths. ? Remove objects (foreign bodies) that are stuck. ? Treat any bleeding with medicines or other devices that stop tissue from bleeding. ? Widen (dilate) or stretch narrowed areas of your esophagus and stomach.  The endoscope will be taken out. The procedure may vary among health care providers and hospitals. What happens after the procedure?  Your blood pressure, heart rate,  breathing rate, and blood oxygen level will be monitored often until the medicines you were given have worn off.  Do not eat or drink anything until the numbing medicine has worn off and your gag reflex has returned. This information is not intended to replace advice given to you by your health care provider. Make sure you discuss any questions you have with your health care provider. Document Released: 12/24/2004 Document Revised: 01/29/2016 Document Reviewed: 07/17/2015 Elsevier Interactive Patient Education  2018 [place] not take iron pills by mouth for now. Resume other medications and diet as before. Physician will call with biopsy results.

## 2017-07-21 NOTE — H&P (Signed)
Tanya Lucero is an 66 y.o. female.   Chief Complaint: Patient is here for EGD. HPI: Patient is 66 year old Caucasian female with multiple medical problems including end-stage renal disease who is on was found to have iron deficiency anemia and heme positive stool last year.  She underwent Colonoscopy in April 2017 with removal of 4 polyps and 2 were tubular adenomas.  Also had sigmoid colon diverticulosis and hemorrhoids but no lesion was found to account for GI blood loss.  She returned for EGD July 2017 she had nodularity to gastric mucosa with iron pigmentation.  There was a question of gave but biopsy was negative.  Similarly the biopsy was negative.  She had small bowel given capsule in March 2018 for drop in her H&H.  This study revealed some coffee-ground material and specks of blood in the stomach.  Therefore follow-up EGD was recommended. Her hemoglobin has dropped again.  2 weeks ago was 8.2 g.  She says her stools are black.  She is also on p.o. iron.  We did Hemoccults and these are positive.  She has occasional heartburn.  She is on a PPI.  She denies nausea vomiting dysphagia or abdominal pain.  She also denies rectal bleeding but she said it would be hard for her to discern color because of visual problems. She does not take OTC NSAIDs. Past Medical History:  Diagnosis Date  . Anemia   . Anxiety   . Arthritis   . Chronic kidney disease    neuphrotic syndrome-05/2013  . Constipation   . Decreased vision    left eye  . Depression   . Diabetes mellitus    Type 2  . Diabetic neuropathy ([place])   . Diverticulitis   . Dry skin   . GERD (gastroesophageal reflux disease)   . GI bleed 07/05/2017  . Gout   . Headache   . Hypertension   . Neuropathy   . Shortness of breath dyspnea   . Skin cancer     Past Surgical History:  Procedure Laterality Date  . ABDOMINAL HYSTERECTOMY    . ADENOIDECTOMY    . APPENDECTOMY     taken out with hysterectomy  . AV FISTULA PLACEMENT Right  09/13/2014   Procedure: Creation of Right Arm RADIOCEPHALIC ARTERIOVENOUS (AV) FISTULA ;  Surgeon: [name], MD;  Location: [facility];  Service: Vascular;  Laterality: Right;  . BACK SURGERY     lumbar -   . CATARACT EXTRACTION W/PHACO Left 07/02/2013   Procedure: CATARACT EXTRACTION PHACO AND INTRAOCULAR LENS PLACEMENT (IOC);  Surgeon: [name], MD;  Location: [facility];  Service: Ophthalmology;  Laterality: Left;  CDE:  6.46  . CATARACT EXTRACTION W/PHACO Right 07/12/2013   Procedure: CATARACT EXTRACTION PHACO AND INTRAOCULAR LENS PLACEMENT (IOC);  Surgeon: [name], MD;  Location: [facility];  Service: Ophthalmology;  Laterality: Right;  CDE:12.48  . COLONOSCOPY N/A 12/11/2015   Procedure: COLONOSCOPY;  Surgeon: [name], MD;  [facility];  Service: Endoscopy;  Laterality: N/A;  1:55  . ESOPHAGOGASTRODUODENOSCOPY N/A 03/11/2016   Procedure: ESOPHAGOGASTRODUODENOSCOPY (EGD);  Surgeon: [name], MD;  [facility];  Service: Endoscopy;  Laterality: N/A;  1230 - Last pt of the day-Dr. Laural Golden going to office to see pt's  . FOOT SURGERY Right    x2-heel spur   . GIVENS CAPSULE STUDY N/A 11/09/2016   Procedure: GIVENS CAPSULE STUDY;  Surgeon: [name], MD;  [facility];  Service:  Endoscopy;  Laterality: N/A;  7:30  . REVISON OF ARTERIOVENOUS FISTULA Right 01/05/2016   Procedure: REVISON OF ARTERIOVENOUS FISTULA- right forearm;  Surgeon: [name], MD;  Location: [facility] [place] [facility];  Service: Vascular;  Laterality: Right;  . SKIN CANCER EXCISION     forehead  . TONSILLECTOMY      Family History  Problem Relation Age of Onset  . Multiple myeloma Mother   . Diabetes Mother   . Heart attack Father 81   Social History:  reports that she has been smoking cigarettes.  She has a 10.00 pack-year smoking history. she has never used smokeless tobacco. She reports that she does not drink alcohol or use drugs.  Allergies:  Allergies  Allergen Reactions  . Penicillins  Rash    Medications Prior to Admission  Medication Sig Dispense Refill  . ALPRAZolam (XANAX) 0.25 MG tablet Take 0.25-0.5 mg 2 (two) times daily by mouth. (0800 & 2000)Take 2 tablets (0.5 mg) in the morning & 1 tablet (0.25 mg) by mouth at bedtime    . amLODipine (NORVASC) 10 MG tablet Take 10 mg daily by mouth. (0800)    . anti-nausea (EMETROL) solution Take 30 mLs every 15 (fifteen) minutes as needed by mouth for nausea or vomiting (X4 doses only).    Marland Kitchen b complex vitamins capsule Take 1 capsule daily by mouth. (0800)    . buPROPion (WELLBUTRIN) 75 MG tablet Take 75 mg daily by mouth. (0800)    . carvedilol (COREG) 6.25 MG tablet Take 6.25 mg 2 (two) times daily with a meal by mouth. (0800&2000)    . cholecalciferol (VITAMIN D) 1000 units tablet Take 1,000 Units by mouth daily.    Marland Kitchen docusate sodium (COLACE) 100 MG capsule Take 100 mg by mouth 2 (two) times daily.    . Ferrous Fumarate (HEMOCYTE) 324 (106 Fe) MG TABS tablet Take 1 tablet daily by mouth. (0800)    . fexofenadine (ALLEGRA) 180 MG tablet Take 180 mg daily by mouth. (0800)    . furosemide (LASIX) 40 MG tablet Take 40 mg daily by mouth. (0800)    . gabapentin (NEURONTIN) 300 MG capsule Take 300 mg 3 (three) times daily by mouth. (0800, 1400, 2000)    . glipiZIDE (GLUCOTROL) 5 MG tablet Take 5 mg 2 (two) times daily before a meal by mouth. (0800&2000)    . hydrALAZINE (APRESOLINE) 25 MG tablet Take 25 mg 3 (three) times daily by mouth.    . insulin glargine (LANTUS) 100 unit/mL SOPN Inject 56 Units 2 (two) times daily into the skin. (0800&2000)    . insulin lispro (HUMALOG) 100 UNIT/ML KiwkPen Inject 10 Units 3 (three) times daily before meals into the skin.    Marland Kitchen loperamide (IMODIUM A-D) 2 MG tablet Take 2 mg 4 (four) times daily as needed by mouth for diarrhea or loose stools (up to 8 doses in 24 hrs.).    Marland Kitchen Melatonin 3 MG TABS Take 3 mg at bedtime by mouth.    . mupirocin ointment (BACTROBAN) 2 % Apply 1 application daily  topically. Clean ingrown nail once daily on right foot with wound wash solution, dress with mupirocin and light gauze dressing until healed    . pantoprazole (PROTONIX) 40 MG tablet Take 40 mg daily at 6 (six) AM by mouth.     . sertraline (ZOLOFT) 50 MG tablet Take 50 mg by mouth daily.    . sevelamer carbonate (RENVELA) 800 MG tablet Take 800-1,600 mg See admin instructions by mouth.  Take 2 tablets (1600 mg) by mouth 3 times daily with meals and 1 tablet (800 mg) by mouth twice daily with snacks.    . Sodium Chloride (SALINE WOUND [place] EX) Apply 1 application See admin instructions topically. Clean ingrown nail once daily on right foot with wound wash solution, dress with mupirocin and light gauze dressing until healed    . acetaminophen (TYLENOL) 500 MG tablet Take 1,000 mg every 6 (six) hours as needed by mouth (FOR HEADACHES/MINOR DISCOMFORT).    . furosemide (LASIX) 20 MG tablet Take 20 mg at bedtime as needed by mouth (for swelling.).    Marland Kitchen guaifenesin (ROBITUSSIN) 100 MG/5ML syrup Take 200 mg every 6 (six) hours as needed by mouth for cough.    Marland Kitchen guaiFENesin-codeine (ROBITUSSIN AC) 100-10 MG/5ML syrup Take 5 mLs every 6 (six) hours as needed by mouth for cough.    . insulin regular (NOVOLIN R,HUMULIN R) 100 units/mL injection Inject 15 Units daily as needed into the skin for high blood sugar (SUGARS OVER 400).    . polyvinyl alcohol (LIQUIFILM TEARS) 1.4 % ophthalmic solution Place 1 drop 5 (five) times daily as needed into both eyes for dry eyes.      Results for orders placed or performed during the hospital encounter of 07/21/17 (from the past 48 hour(s))  Glucose, capillary     Status: Abnormal   Collection Time: 07/21/17 12:53 PM  Result Value Ref Range   Glucose-Capillary 53 (L) 65 - 99 mg/dL   No results found.  ROS  Pulse 73, temperature 98.2 F (36.8 C), temperature source Oral, resp. rate 13, SpO2 97 %. Physical Exam  Constitutional: She appears well-developed and  well-nourished.  HENT:  Mouth/Throat: Oropharynx is clear and moist.  Eyes: No scleral icterus.  Conjunctivae is pale.  Neck: No thyromegaly present.  Cardiovascular: Normal rate and regular rhythm.  Murmur heard. Faint systolic murmur noted at aortic area.  Respiratory: Effort normal and breath sounds normal.  GI: Soft. She exhibits no distension and no mass. There is no tenderness.  Musculoskeletal: She exhibits no edema.  Lymphadenopathy:    She has no cervical adenopathy.  Neurological: She is alert.  Skin: Skin is warm and dry.     Assessment/Plan Anemia and heme positive stool. Iron deficiency anemia but will studies from February this year would suggest anemia of chronic disease. Abnormal small bowel given capsule study suggesting stomach as source of blood loss. Diagnostic EGD.  Hildred Laser, MD 07/21/2017, 1:07 PM

## 2017-07-21 NOTE — Op Note (Signed)
Patient blood sugar 63 in Recovery room.  Dr Laural Golden consulted and coke and pie was given at patients request.  Blood sugar increased to 104. No further intervention needed.

## 2017-07-25 ENCOUNTER — Encounter (HOSPITAL_COMMUNITY): Payer: Self-pay | Admitting: Internal Medicine

## 2017-12-20 ENCOUNTER — Other Ambulatory Visit: Payer: Self-pay

## 2017-12-20 ENCOUNTER — Encounter: Payer: Self-pay | Admitting: Physical Therapy

## 2017-12-20 ENCOUNTER — Ambulatory Visit: Payer: Medicare Other | Attending: *Deleted | Admitting: Physical Therapy

## 2017-12-20 DIAGNOSIS — R2681 Unsteadiness on feet: Secondary | ICD-10-CM | POA: Insufficient documentation

## 2017-12-20 NOTE — Therapy (Signed)
[facility] [place], [place], [address] Phone: [phone]   Fax:  [phone]  Physical Therapy Evaluation  Patient Details  Name: Tanya Lucero MRN: [id number] Date of Birth: 03-31-1951 Referring Provider: [name] DO   Encounter Date: 12/20/2017  PT End of Session - 12/20/17 1404    Visit Number  1    Number of Visits  12    Date for PT Re-Evaluation  03/14/18    PT Start Time  0954    PT Stop Time  1033    PT Time Calculation (min)  39 min    Activity Tolerance  Patient tolerated treatment well    Behavior During Therapy  [id number] for tasks assessed/performed       Past Medical History:  Diagnosis Date  . Anemia   . Anxiety   . Arthritis   . Chronic kidney disease    neuphrotic syndrome-05/2013  . Constipation   . Decreased vision    left eye  . Depression   . Diabetes mellitus    Type 2  . Diabetic neuropathy ([place])   . Diverticulitis   . Dry skin   . GERD (gastroesophageal reflux disease)   . GI bleed 07/05/2017  . Gout   . Headache   . Hypertension   . Neuropathy   . Shortness of breath dyspnea   . Skin cancer     Past Surgical History:  Procedure Laterality Date  . ABDOMINAL HYSTERECTOMY    . ADENOIDECTOMY    . APPENDECTOMY     taken out with hysterectomy  . AV FISTULA PLACEMENT Right 09/13/2014   Procedure: Creation of Right Arm RADIOCEPHALIC ARTERIOVENOUS (AV) FISTULA ;  Surgeon: [name], MD;  Location: [place];  Service: Vascular;  Laterality: Right;  . BACK SURGERY     lumbar -   . CATARACT EXTRACTION W/PHACO Left 07/02/2013   Procedure: CATARACT EXTRACTION PHACO AND INTRAOCULAR LENS PLACEMENT (IOC);  Surgeon: [name], MD;  [facility];  Service: Ophthalmology;  Laterality: Left;  CDE:  6.46  . CATARACT EXTRACTION W/PHACO Right 07/12/2013   Procedure: CATARACT EXTRACTION PHACO AND INTRAOCULAR LENS PLACEMENT (IOC);  Surgeon: [name], MD;  [facility];  Service:  Ophthalmology;  Laterality: Right;  CDE:12.48  . COLONOSCOPY N/A 12/11/2015   Procedure: COLONOSCOPY;  Surgeon: [name], MD;  [facility];  Service: Endoscopy;  Laterality: N/A;  1:55  . ESOPHAGOGASTRODUODENOSCOPY N/A 03/11/2016   Procedure: ESOPHAGOGASTRODUODENOSCOPY (EGD);  Surgeon: [name], MD;  [facility];  Service: Endoscopy;  Laterality: N/A;  1230 - Last pt of the day-Dr. Laural Golden going to office to see pt's  . ESOPHAGOGASTRODUODENOSCOPY N/A 07/21/2017   Procedure: ESOPHAGOGASTRODUODENOSCOPY (EGD);  Surgeon: [name], MD;  [facility];  Service: Endoscopy;  Laterality: N/A;  3:00  . FOOT SURGERY Right    x2-heel spur   . GIVENS CAPSULE STUDY N/A 11/09/2016   Procedure: GIVENS CAPSULE STUDY;  Surgeon: [name], MD;  [facility];  Service: Endoscopy;  Laterality: N/A;  7:30  . REVISON OF ARTERIOVENOUS FISTULA Right 01/05/2016   Procedure: REVISON OF ARTERIOVENOUS FISTULA- right forearm;  Surgeon: [name], MD;  Location: [place];  Service: Vascular;  Laterality: Right;  . SKIN CANCER EXCISION     forehead  . TONSILLECTOMY      There were no vitals filed for this visit.   Subjective Assessment - 12/20/17 1408  Subjective  Patient presents with c/o unsteadiness due to diabetic neuopathy. She reports one fall after reaching forward and then returning to standing (falling backwards) and spraining her R wrist. She also slipped from sitting off the bed once.     Pertinent History  CKD stage 4; HTN, DM, lmbar disc 1991, depression, anxiety    Patient Stated Goals  to stop falling    Currently in Pain?  No/denies         [facility] PT Assessment - 12/20/17 0001      Assessment   Medical Diagnosis  poor balance; diabetic neuropathy    Referring Provider  [name] DO    Onset Date/Surgical Date  11/22/17    Hand Dominance  Right    Next MD Visit  [name]      Precautions   Precautions  Other (comment)     Precaution Comments  R wrist sprain      Balance Screen   Has the patient fallen in the past 6 months  Yes    How many times?  1 fell backwards after leaning forward to pick something up    Has the patient had a decrease in activity level because of a fear of falling?   Yes    Is the patient reluctant to leave their home because of a fear of falling?   Yes      [place] - 4 wheels      Prior Function   Level of Independence  Independent with household mobility with device      Posture/Postural Control   Posture/Postural Control  Postural limitations    Postural Limitations  Rounded Shoulders;Forward head      ROM / Strength   AROM / PROM / Strength  Strength      Strength   Overall Strength Comments  grossly 5/5 in hips/knees in sitting; Ankle DF 5/5, Inv 4-/5, Ever 4+/5 Bil      Balance   Balance Assessed  Yes      Standardized Balance Assessment   Standardized Balance Assessment  Five Times Sit to Stand;Berg Balance Test    Five times sit to stand comments   27 sec      Berg Balance Test   Sit to Stand  Able to stand without using hands and stabilize independently    Standing Unsupported  Able to stand safely 2 minutes    Sitting with Back Unsupported but Feet Supported on Floor or Stool  Able to sit safely and securely 2 minutes    Stand to Sit  Sits safely with minimal use of hands    Transfers  Able to transfer safely, minor use of hands    Standing Unsupported with Eyes Closed  Able to stand 10 seconds with supervision    Standing Ubsupported with Feet Together  Needs help to attain position but able to stand for 30 seconds with feet together    From Standing, Reach Forward with Outstretched Arm  Can reach confidently >25 cm (10")    From Standing Position, Pick up Object from Floor  Able to pick up shoe safely and easily    From Standing Position, Turn to Look Behind Over each Shoulder  Looks  behind from both sides and weight shifts well    Turn 360 Degrees  Able to turn 360 degrees safely but slowly LOB when asked to go the other way  Standing Unsupported, Alternately Place Feet on Step/Stool  Needs assistance to keep from falling or unable to try    Standing Unsupported, One Foot in [employer] balance while stepping or standing    Standing on One Leg  Unable to try or needs assist to prevent fall    Total Score  38                Objective measurements completed on examination: See above findings.              PT Education - 12/20/17 1417    Education provided  Yes    Education Details  HEP    Person(s) Educated  Patient    Methods  Explanation;Handout;Demonstration    Comprehension  Verbalized understanding;Returned demonstration          PT Long Term Goals - 12/20/17 1424      PT LONG TERM GOAL #1   Title  Patient to improve BERG to 48/54 to reduce fall risk.    Time  6    Period  Weeks    Status  New    Target Date  01/31/18      PT LONG TERM GOAL #2   Title  Patient to report no falls.    Time  6    Period  Weeks    Status  New             Plan - 12/20/17 1419    Clinical Impression Statement  Patient presents for low complexity evaluation for unsteadines due to neuropathy. She amb with a rollator walker. She has difficulty with higher level balance activities but demonstrates good core and LE strength. Patient will benefit from PT to address her balance deficits.    History and Personal Factors relevant to plan of care:  Dialysis 3x/wk; lives in assisted living facility    Clinical Presentation  Stable    Clinical Decision Making  Low    Rehab Potential  Good    Clinical Impairments Affecting Rehab Potential  neuropathy, R wrist sprain, CKD stage 4    PT Frequency  2x / week    PT Duration  6 weeks    PT Treatment/Interventions  ADLs/Self Care Home Management;Gait training;Therapeutic exercise;Therapeutic  activities;Neuromuscular re-education;Balance training;Patient/family education    PT Next Visit Plan  Balance/gait training/sit to stand    PT Home Exercise Plan  sit to stand; SLS at counter    Consulted and Agree with Plan of Care  Patient       Patient will benefit from skilled therapeutic intervention in order to improve the following deficits and impairments:  Decreased balance, Postural dysfunction  Visit Diagnosis: Unsteadiness on feet - Plan: PT plan of care cert/re-cert     Problem List Patient Active Problem List   Diagnosis Date Noted  . Guaiac positive stools 07/19/2017  . GI bleed 07/05/2017  . Iron deficiency anemia due to chronic blood loss 10/27/2016  . RENAL INSUFFICIENCY 03/04/2010  . DM 02/17/2010  . HYPERTENSION 02/17/2010  . OTHER MALAISE AND FATIGUE 02/17/2010  . SHORTNESS OF BREATH 02/17/2010  . CHEST PAIN, PRECORDIAL 02/17/2010    Madelyn Flavors PT 12/20/2017, 2:33 PM  [facility] [address] [place], [place], [address] Phone: [phone]   Fax:  [phone]  Name: ERRIKA NARVAIZ MRN: [id number] Date of Birth: August 19, 1951

## 2017-12-27 ENCOUNTER — Ambulatory Visit: Payer: Medicare Other | Admitting: Physical Therapy

## 2017-12-27 DIAGNOSIS — R2681 Unsteadiness on feet: Secondary | ICD-10-CM

## 2017-12-27 NOTE — Therapy (Signed)
[facility] [place], [place], [address] Phone: [phone]   Fax:  [phone]  Physical Therapy Treatment  Patient Details  Name: Tanya Lucero MRN: [id number] Date of Birth: 07-04-1951 Referring Provider: [name] DO   Encounter Date: 12/27/2017  PT End of Session - 12/27/17 1039    Visit Number  2    Number of Visits  12    Date for PT Re-Evaluation  03/14/18    PT Start Time  1032    PT Stop Time  1115    PT Time Calculation (min)  43 min    Activity Tolerance  Patient tolerated treatment well    Behavior During Therapy  [facility] for tasks assessed/performed       Past Medical History:  Diagnosis Date  . Anemia   . Anxiety   . Arthritis   . Chronic kidney disease    neuphrotic syndrome-05/2013  . Constipation   . Decreased vision    left eye  . Depression   . Diabetes mellitus    Type 2  . Diabetic neuropathy ([place])   . Diverticulitis   . Dry skin   . GERD (gastroesophageal reflux disease)   . GI bleed 07/05/2017  . Gout   . Headache   . Hypertension   . Neuropathy   . Shortness of breath dyspnea   . Skin cancer     Past Surgical History:  Procedure Laterality Date  . ABDOMINAL HYSTERECTOMY    . ADENOIDECTOMY    . APPENDECTOMY     taken out with hysterectomy  . AV FISTULA PLACEMENT Right 09/13/2014   Procedure: Creation of Right Arm RADIOCEPHALIC ARTERIOVENOUS (AV) FISTULA ;  Surgeon: [name], MD;  Location: [place];  Service: Vascular;  Laterality: Right;  . BACK SURGERY     lumbar -   . CATARACT EXTRACTION W/PHACO Left 07/02/2013   Procedure: CATARACT EXTRACTION PHACO AND INTRAOCULAR LENS PLACEMENT (IOC);  Surgeon: [name], MD;  [facility];  Service: Ophthalmology;  Laterality: Left;  CDE:  6.46  . CATARACT EXTRACTION W/PHACO Right 07/12/2013   Procedure: CATARACT EXTRACTION PHACO AND INTRAOCULAR LENS PLACEMENT (IOC);  Surgeon: [name], MD;  [facility];  Service:  Ophthalmology;  Laterality: Right;  CDE:12.48  . COLONOSCOPY N/A 12/11/2015   Procedure: COLONOSCOPY;  Surgeon: [name], MD;  [facility];  Service: Endoscopy;  Laterality: N/A;  1:55  . ESOPHAGOGASTRODUODENOSCOPY N/A 03/11/2016   Procedure: ESOPHAGOGASTRODUODENOSCOPY (EGD);  Surgeon: [name], MD;  [facility];  Service: Endoscopy;  Laterality: N/A;  1230 - Last pt of the day-Dr. Laural Golden going to office to see pt's  . ESOPHAGOGASTRODUODENOSCOPY N/A 07/21/2017   Procedure: ESOPHAGOGASTRODUODENOSCOPY (EGD);  Surgeon: [name], MD;  [facility];  Service: Endoscopy;  Laterality: N/A;  3:00  . FOOT SURGERY Right    x2-heel spur   . GIVENS CAPSULE STUDY N/A 11/09/2016   Procedure: GIVENS CAPSULE STUDY;  Surgeon: [name], MD;  [facility];  Service: Endoscopy;  Laterality: N/A;  7:30  . REVISON OF ARTERIOVENOUS FISTULA Right 01/05/2016   Procedure: REVISON OF ARTERIOVENOUS FISTULA- right forearm;  Surgeon: [name], MD;  Location: [place];  Service: Vascular;  Laterality: Right;  . SKIN CANCER EXCISION     forehead  . TONSILLECTOMY      There were no vitals filed for this visit.  Subjective Assessment - 12/27/17 1038  Subjective  Patient reported feeling tired today.    Pertinent History  CKD stage 4; HTN, DM, lumbar disc 1991, depression, anxiety    Patient Stated Goals  to stop falling    Currently in Pain?  No/denies         [facility] PT Assessment - 12/27/17 0001      Assessment   Medical Diagnosis  poor balance; diabetic neuropathy    Onset Date/Surgical Date  11/22/17    Hand Dominance  Right    Next MD Visit  [name]      Precautions   Precautions  Other (comment)    Precaution Comments  R wrist sprain                   [facility] Adult PT Treatment/Exercise - 12/27/17 0001      Exercises   Exercises  Knee/Hip      Knee/Hip Exercises: Aerobic   Stationary Bike  Level 1 x8 minutes with  intermittent rests secondary to LE soreness      Knee/Hip Exercises: Standing   Forward Step Up  Both;1 set;10 reps;Hand Hold: 1;Step Height: 4"      Knee/Hip Exercises: Seated   Other Seated Knee/Hip Exercises  Heel raises/toe raises x20 each    Sit to Sand  3 sets;5 reps;without UE support          Balance Exercises - 12/27/17 1051      Balance Exercises: Standing   Standing Eyes Opened  Narrow base of support (BOS);Solid surface;2 reps;20 secs    SLS  Eyes open;Solid surface;Upper extremity support 1;Upper extremity support 2;4 reps;10 secs    Rockerboard  Anterior/posterior;Other time (comment) 1 minute    Gait with Head Turns  Forward;Upper extremity support;3 reps with rollator followed by  cognitive tasks             PT Long Term Goals - 12/20/17 1424      PT LONG TERM GOAL #1   Title  Patient to improve BERG to 48/54 to reduce fall risk.    Time  6    Period  Weeks    Status  New    Target Date  01/31/18      PT LONG TERM GOAL #2   Title  Patient to report no falls.    Time  6    Period  Weeks    Status  New            Plan - 12/27/17 1201    Clinical Impression Statement  Patient was able was able to complete treatment despite reports of low back pain and knee pain. Patient provided with seated rest breaks throughout the session for pain relief. Patient noted difficulty and fear of falling with rockerboard [facility] balance. Patient educated importance of challenging balance to gain confidence and to improve automaticity of ankle strategies during a loss of balance. Patient educated the importance of checking glucose levels especially when she feels dizzy or nauseous. Patient stated the facility checks glucose and stated she can ask to check often glucose levels if symptomatic.    Clinical Presentation  Stable    Clinical Decision Making  Low    Rehab Potential  Good    Clinical Impairments Affecting Rehab Potential  neuropathy, R wrist sprain, CKD stage 4     PT Frequency  2x / week    PT Duration  6 weeks    PT Treatment/Interventions  ADLs/Self Care Home Management;Gait training;Therapeutic exercise;Therapeutic activities;Neuromuscular  re-education;Balance training;Patient/family education    PT Next Visit Plan  Nustep; Balance/gait training/sit to stand    Consulted and Agree with Plan of Care  Patient       Patient will benefit from skilled therapeutic intervention in order to improve the following deficits and impairments:  Decreased balance, Postural dysfunction  Visit Diagnosis: Unsteadiness on feet     Problem List Patient Active Problem List   Diagnosis Date Noted  . Guaiac positive stools 07/19/2017  . GI bleed 07/05/2017  . Iron deficiency anemia due to chronic blood loss 10/27/2016  . RENAL INSUFFICIENCY 03/04/2010  . DM 02/17/2010  . HYPERTENSION 02/17/2010  . OTHER MALAISE AND FATIGUE 02/17/2010  . SHORTNESS OF BREATH 02/17/2010  . CHEST PAIN, PRECORDIAL 02/17/2010   Gabriela Eves, PT, DPT 12/27/2017, 12:15 PM  [facility] [address] [place], [place], [address] Phone: [phone]   Fax:  [phone]  Name: Tanya Lucero MRN: [id number] Date of Birth: 1951-04-06

## 2017-12-29 ENCOUNTER — Ambulatory Visit: Payer: Medicare Other | Admitting: *Deleted

## 2017-12-29 DIAGNOSIS — R2681 Unsteadiness on feet: Secondary | ICD-10-CM | POA: Diagnosis not present

## 2017-12-29 NOTE — Therapy (Signed)
[facility] [place], [place], [address] Phone: [phone]   Fax:  [phone]  Physical Therapy Treatment  Patient Details  Name: Tanya Lucero MRN: [id number] Date of Birth: August 07, 1951 Referring Provider: [name] DO   Encounter Date: 12/29/2017  PT End of Session - 12/29/17 1132    Visit Number  3    Number of Visits  12    Date for PT Re-Evaluation  03/14/18    PT Start Time  1030    PT Stop Time  1120    PT Time Calculation (min)  50 min       Past Medical History:  Diagnosis Date  . Anemia   . Anxiety   . Arthritis   . Chronic kidney disease    neuphrotic syndrome-05/2013  . Constipation   . Decreased vision    left eye  . Depression   . Diabetes mellitus    Type 2  . Diabetic neuropathy ([place])   . Diverticulitis   . Dry skin   . GERD (gastroesophageal reflux disease)   . GI bleed 07/05/2017  . Gout   . Headache   . Hypertension   . Neuropathy   . Shortness of breath dyspnea   . Skin cancer     Past Surgical History:  Procedure Laterality Date  . ABDOMINAL HYSTERECTOMY    . ADENOIDECTOMY    . APPENDECTOMY     taken out with hysterectomy  . AV FISTULA PLACEMENT Right 09/13/2014   Procedure: Creation of Right Arm RADIOCEPHALIC ARTERIOVENOUS (AV) FISTULA ;  Surgeon: [name], MD;  Location: [place];  Service: Vascular;  Laterality: Right;  . BACK SURGERY     lumbar -   . CATARACT EXTRACTION W/PHACO Left 07/02/2013   Procedure: CATARACT EXTRACTION PHACO AND INTRAOCULAR LENS PLACEMENT (IOC);  Surgeon: [name], MD;  [facility];  Service: Ophthalmology;  Laterality: Left;  CDE:  6.46  . CATARACT EXTRACTION W/PHACO Right 07/12/2013   Procedure: CATARACT EXTRACTION PHACO AND INTRAOCULAR LENS PLACEMENT (IOC);  Surgeon: [name], MD;  [facility];  Service: Ophthalmology;  Laterality: Right;  CDE:12.48  . COLONOSCOPY N/A 12/11/2015   Procedure: COLONOSCOPY;  Surgeon: [name], MD;  [facility];  Service: Endoscopy;  Laterality: N/A;  1:55  . ESOPHAGOGASTRODUODENOSCOPY N/A 03/11/2016   Procedure: ESOPHAGOGASTRODUODENOSCOPY (EGD);  Surgeon: [name], MD;  [facility];  Service: Endoscopy;  Laterality: N/A;  1230 - Last pt of the day-Dr. Laural Golden going to office to see pt's  . ESOPHAGOGASTRODUODENOSCOPY N/A 07/21/2017   Procedure: ESOPHAGOGASTRODUODENOSCOPY (EGD);  Surgeon: [name], MD;  [facility];  Service: Endoscopy;  Laterality: N/A;  3:00  . FOOT SURGERY Right    x2-heel spur   . GIVENS CAPSULE STUDY N/A 11/09/2016   Procedure: GIVENS CAPSULE STUDY;  Surgeon: [name], MD;  [facility];  Service: Endoscopy;  Laterality: N/A;  7:30  . REVISON OF ARTERIOVENOUS FISTULA Right 01/05/2016   Procedure: REVISON OF ARTERIOVENOUS FISTULA- right forearm;  Surgeon: [name], MD;  Location: [place];  Service: Vascular;  Laterality: Right;  . SKIN CANCER EXCISION     forehead  . TONSILLECTOMY      There were no vitals filed for this visit.                    [facility] Adult PT Treatment/Exercise - 12/29/17 0001  Exercises   Exercises  Knee/Hip      Knee/Hip Exercises: Aerobic   Nustep  L3 x10 mins seat 8      Knee/Hip Exercises: Standing   Other Standing Knee Exercises  standing on balance pad and pointing to numbers on the wall as they are called out      Knee/Hip Exercises: Seated   Other Seated Knee/Hip Exercises  Heel raises/toe raises x20 each    Sit to Sand  3 sets;5 reps;without UE support  x 5 16secs,was the best time          Balance Exercises - 12/29/17 1123      Balance Exercises: Standing   Standing Eyes Opened  Narrow base of support (BOS);Solid surface;2 reps;20 secs    SLS  Eyes open;Solid surface;Upper extremity support 1;Upper extremity support 2;4 reps;10 secs and on dyna disc    Gait with Head Turns  Forward;Upper extremity support;3 reps with 1 HHA   followed by  cognitive tasks             PT Long Term Goals - 12/20/17 1424      PT LONG TERM GOAL #1   Title  Patient to improve BERG to 48/54 to reduce fall risk.    Time  6    Period  Weeks    Status  New    Target Date  01/31/18      PT LONG TERM GOAL #2   Title  Patient to report no falls.    Time  6    Period  Weeks    Status  New            Plan - 12/29/17 1135    Clinical Impression Statement  Pt arrived today doing fair. She was able to complete all therex and balance act.'s today with decreased rest breaks than last Rx. Rocker board was not performed due to fear of falling. She was able to perform 5 sit to stands  with 16 secs  being her best time.    Clinical Presentation  Stable    Rehab Potential  Good    Clinical Impairments Affecting Rehab Potential  neuropathy, R wrist sprain, CKD stage 4    PT Frequency  2x / week    PT Duration  6 weeks    PT Treatment/Interventions  ADLs/Self Care Home Management;Gait training;Therapeutic exercise;Therapeutic activities;Neuromuscular re-education;Balance training;Patient/family education    PT Next Visit Plan  Nustep; Balance/gait training/sit to stand    PT Home Exercise Plan  sit to stand; SLS at counter    Consulted and Agree with Plan of Care  Patient       Patient will benefit from skilled therapeutic intervention in order to improve the following deficits and impairments:  Decreased balance, Postural dysfunction  Visit Diagnosis: Unsteadiness on feet     Problem List Patient Active Problem List   Diagnosis Date Noted  . Guaiac positive stools 07/19/2017  . GI bleed 07/05/2017  . Iron deficiency anemia due to chronic blood loss 10/27/2016  . RENAL INSUFFICIENCY 03/04/2010  . DM 02/17/2010  . HYPERTENSION 02/17/2010  . OTHER MALAISE AND FATIGUE 02/17/2010  . SHORTNESS OF BREATH 02/17/2010  . CHEST PAIN, PRECORDIAL 02/17/2010    Tanya Lucero,CHRIS, PTA 12/29/2017, 3:51 PM  [facility] [place], [place], [address] Phone: [phone]   Fax:  [phone]  Name: Tanya Lucero MRN: [id number] Date of Birth: 1951-07-15

## 2018-01-03 ENCOUNTER — Encounter (INDEPENDENT_AMBULATORY_CARE_PROVIDER_SITE_OTHER): Payer: Self-pay | Admitting: Internal Medicine

## 2018-01-03 ENCOUNTER — Ambulatory Visit (INDEPENDENT_AMBULATORY_CARE_PROVIDER_SITE_OTHER): Payer: Medicare Other | Admitting: Internal Medicine

## 2018-01-03 VITALS — BP 142/78 | HR 80 | Temp 98.7°F | Ht 64.0 in | Wt 216.0 lb

## 2018-01-03 DIAGNOSIS — K922 Gastrointestinal hemorrhage, unspecified: Secondary | ICD-10-CM | POA: Diagnosis not present

## 2018-01-03 LAB — CBC
HCT: 23.7 % — ABNORMAL LOW (ref 35.0–45.0)
Hemoglobin: 8.1 g/dL — ABNORMAL LOW (ref 11.7–15.5)
MCH: 32.7 pg (ref 27.0–33.0)
MCHC: 34.2 g/dL (ref 32.0–36.0)
MCV: 95.6 fL (ref 80.0–100.0)
MPV: 9.5 fL (ref 7.5–12.5)
PLATELETS: 182 10*3/uL (ref 140–400)
RBC: 2.48 10*6/uL — ABNORMAL LOW (ref 3.80–5.10)
RDW: 16.9 % — AB (ref 11.0–15.0)
WBC: 4.9 10*3/uL (ref 3.8–10.8)

## 2018-01-03 NOTE — Patient Instructions (Signed)
OV in 6 months. 

## 2018-01-03 NOTE — Progress Notes (Addendum)
Subjective:    Patient ID: Tanya Lucero, female    DOB: Jul 27, 1951, 67 y.o.   MRN: [id number]  HPI Here today for f/u. Last seen in October of 2018. Her stool were black at OV. Hx of IDA and chronic GI bleeding. 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  Esophageal polyp found. Portal hypertensive gastropathy.  Two gastric polyps.  Normal cardia,gastric body and pylorus. Normal duodenal bulb and second portion of the duodenum.  She tells me she is doing well. She has not had blood work a couple weeks ago at Dialysis.  She is not on iron at this time. Her stools are dark in color.  She sometimes her stools look black.  Appetite is okay. She has gained 4 pounds since her last office visit. She has about 3 stools a day and re usually early in the morning. She lives at [place].    Diabetic at least 10 yrs or more. She takes Dialysis M-W-F.  On dialysis x 1 year.       11/09/2016 Given's capsule. Anemia and heme positive stool. Findings:  Patient was able to swallow given without any difficulty. Study duration 7 hours 54 minutes and 8 seconds. Gastric mucosa with erythema edema and to ileus covered with specks of blood or coffee-ground. No active bleeding noted. Diffuse pigmentation noted to do dental mucosa felt to be due to hemosiderin. This finding was also confirmed on EGD with biopsy in July 2017. No other small bowel lesions noted.     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 Heme positive stool.  Impression: - Normal esophagus. - Z-line regular, 40 cm from the incisors. - Nodular mucosa in the gastric antrum. 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 Biopsied. - Normal duodenal bulb. - Mucosal pigmentation in the duodenum.  Biopsied.  12/11/2015 Colonoscopy: Guaiac positive stool:  Impression: - Two 4 to 5 mm polyps at the recto-sigmoid colon  and in the cecum, removed with a cold snare.  Resected and retrieved. - One 12 mm polyp at the hepatic flexure, removed  with a hot snare. Resected and retrieved. - One 8 mm polyp in the transverse colon, removed  with a hot snare. Resected and retrieved. - Diverticulosis in the sigmoid colon. - External hemorrhoids.  He had 4 polyps removed. 2 are tubular adenomas.     Review of Systems Past Medical History:  Diagnosis Date  . Anemia   . Anxiety   . Arthritis   . Chronic kidney disease    neuphrotic syndrome-05/2013  . Constipation   . Decreased vision    left eye  . Depression   . Diabetes mellitus    Type 2  . Diabetic neuropathy ([place])   . Diverticulitis   . Dry skin   . GERD (gastroesophageal reflux disease)   . GI bleed 07/05/2017  . Gout   . Headache   . Hypertension   . Neuropathy   . Shortness of breath dyspnea   . Skin cancer     Past Surgical History:  Procedure Laterality Date  . ABDOMINAL HYSTERECTOMY    . ADENOIDECTOMY    . APPENDECTOMY     taken out with hysterectomy  . AV FISTULA PLACEMENT Right 09/13/2014   Procedure: Creation of Right Arm RADIOCEPHALIC ARTERIOVENOUS (AV) FISTULA ;  Surgeon: [name], MD;  Location: [facility];  Service: Vascular;  Laterality: Right;  .  BACK SURGERY     lumbar -   . CATARACT EXTRACTION W/PHACO Left 07/02/2013   Procedure: CATARACT EXTRACTION PHACO AND INTRAOCULAR LENS PLACEMENT (IOC);  Surgeon: [name], MD;  [facility];  Service: Ophthalmology;  Laterality: Left;  CDE:  6.46  . CATARACT EXTRACTION W/PHACO Right 07/12/2013   Procedure: CATARACT EXTRACTION PHACO AND  INTRAOCULAR LENS PLACEMENT (IOC);  Surgeon: [name], MD;  [facility];  Service: Ophthalmology;  Laterality: Right;  CDE:12.48  . COLONOSCOPY N/A 12/11/2015   Procedure: COLONOSCOPY;  Surgeon: [name], MD;  [facility];  Service: Endoscopy;  Laterality: N/A;  1:55  . ESOPHAGOGASTRODUODENOSCOPY N/A 03/11/2016   Procedure: ESOPHAGOGASTRODUODENOSCOPY (EGD);  Surgeon: [name], MD;  [facility];  Service: Endoscopy;  Laterality: N/A;  1230 - Last pt of the day-Dr. Laural Golden going to office to see pt's  . ESOPHAGOGASTRODUODENOSCOPY N/A 07/21/2017   Procedure: ESOPHAGOGASTRODUODENOSCOPY (EGD);  Surgeon: [name], MD;  [facility];  Service: Endoscopy;  Laterality: N/A;  3:00  . FOOT SURGERY Right    x2-heel spur   . GIVENS CAPSULE STUDY N/A 11/09/2016   Procedure: GIVENS CAPSULE STUDY;  Surgeon: [name], MD;  [facility];  Service: Endoscopy;  Laterality: N/A;  7:30  . REVISON OF ARTERIOVENOUS FISTULA Right 01/05/2016   Procedure: REVISON OF ARTERIOVENOUS FISTULA- right forearm;  Surgeon: [name], MD;  Location: [facility];  Service: Vascular;  Laterality: Right;  . SKIN CANCER EXCISION     forehead  . TONSILLECTOMY      Allergies  Allergen Reactions  . Penicillins Rash    Current Outpatient Medications on File Prior to Visit  Medication Sig Dispense Refill  . ALPRAZolam (XANAX) 0.25 MG tablet Take 0.25-0.5 mg 2 (two) times daily by mouth. (0800 & 2000)Take 2 tablets (0.5 mg) in the morning & 1 tablet (0.25 mg) by mouth at bedtime    . amLODipine (NORVASC) 10 MG tablet Take 10 mg daily by mouth. (0800)    . B Complex Vitamins (VITAMIN B COMPLEX PO) Take by mouth.    Marland Kitchen buPROPion (WELLBUTRIN) 75 MG tablet Take 75 mg daily by mouth. (0800)    . carvedilol (COREG) 6.25 MG tablet Take 6.25 mg 2 (two) times daily with a meal by mouth. (0800&2000)    . fexofenadine (ALLEGRA) 180 MG tablet Take 180 mg daily by mouth. (0800)     . furosemide (LASIX) 40 MG tablet Take 40 mg daily by mouth. (0800)    . gabapentin (NEURONTIN) 300 MG capsule Take 300 mg 3 (three) times daily by mouth. (0800, 1400, 2000)    . glipiZIDE (GLUCOTROL) 5 MG tablet Take 5 mg 2 (two) times daily before a meal by mouth. (0800&2000)    . hydrALAZINE (APRESOLINE) 25 MG tablet Take 25 mg 3 (three) times daily by mouth.    . insulin glargine (LANTUS) 100 unit/mL SOPN Inject 56 Units 2 (two) times daily into the skin. (0800&2000)    . insulin lispro (HUMALOG) 100 UNIT/ML KiwkPen Inject 10 Units 3 (three) times daily before meals into the skin.    Marland Kitchen insulin regular (NOVOLIN R,HUMULIN R) 100 units/mL injection Inject 15 Units daily as needed into the skin for high blood sugar (SUGARS OVER 400).    Marland Kitchen loperamide (IMODIUM A-D) 2 MG tablet Take 2 mg 4 (four) times daily as needed by mouth for diarrhea or loose stools (up to 8 doses in 24 hrs.).    Marland Kitchen  Melatonin 3 MG TABS Take 3 mg at bedtime by mouth.    . mupirocin ointment (BACTROBAN) 2 % Apply 1 application daily topically. Clean ingrown nail once daily on right foot with wound wash solution, dress with mupirocin and light gauze dressing until healed    . pantoprazole (PROTONIX) 40 MG tablet Take 40 mg daily at 6 (six) AM by mouth.     . polyvinyl alcohol (LIQUIFILM TEARS) 1.4 % ophthalmic solution Place 1 drop 5 (five) times daily as needed into both eyes for dry eyes.    Marland Kitchen sertraline (ZOLOFT) 50 MG tablet Take 50 mg by mouth daily.    . sevelamer carbonate (RENVELA) 800 MG tablet Take 800-1,600 mg See admin instructions by mouth. Take 2 tablets (1600 mg) by mouth 3 times daily with meals and 1 tablet (800 mg) by mouth twice daily with snacks.    Marland Kitchen acetaminophen (TYLENOL) 500 MG tablet Take 1,000 mg every 6 (six) hours as needed by mouth (FOR HEADACHES/MINOR DISCOMFORT).    Marland Kitchen anti-nausea (EMETROL) solution Take 30 mLs every 15 (fifteen) minutes as needed by mouth for nausea or vomiting (X4 doses only).    Marland Kitchen b  complex vitamins capsule Take 1 capsule daily by mouth. (0800)    . cholecalciferol (VITAMIN D) 1000 units tablet Take 1,000 Units by mouth daily.    Marland Kitchen docusate sodium (COLACE) 100 MG capsule Take 100 mg by mouth 2 (two) times daily.    Marland Kitchen guaifenesin (ROBITUSSIN) 100 MG/5ML syrup Take 200 mg every 6 (six) hours as needed by mouth for cough.    Marland Kitchen guaiFENesin-codeine (ROBITUSSIN AC) 100-10 MG/5ML syrup Take 5 mLs every 6 (six) hours as needed by mouth for cough.     No current facility-administered medications on file prior to visit.         Objective:   Physical Exam Blood pressure (!) 142/78, pulse 80, temperature 98.7 F (37.1 C), height 5\' 4"  (1.626 m), weight 216 lb (98 kg). Alert and oriented. Skin warm and dry. Oral mucosa is moist.   . Sclera anicteric, conjunctivae is pink. Thyroid not enlarged. No cervical lymphadenopathy. Lungs clear. Heart regular rate and rhythm.  Abdomen is soft. Bowel sounds are positive. No hepatomegaly. No abdominal masses felt. No tenderness.  No edema to lower extremities.           Assessment & Plan:  GI bleed. Am going to get a CBC today. Further recommendations to follow.

## 2018-01-04 ENCOUNTER — Other Ambulatory Visit (INDEPENDENT_AMBULATORY_CARE_PROVIDER_SITE_OTHER): Payer: Self-pay | Admitting: *Deleted

## 2018-01-04 ENCOUNTER — Encounter (INDEPENDENT_AMBULATORY_CARE_PROVIDER_SITE_OTHER): Payer: Self-pay | Admitting: *Deleted

## 2018-01-04 DIAGNOSIS — K922 Gastrointestinal hemorrhage, unspecified: Secondary | ICD-10-CM

## 2018-01-04 DIAGNOSIS — D649 Anemia, unspecified: Secondary | ICD-10-CM

## 2018-01-05 ENCOUNTER — Encounter: Payer: Medicare Other | Admitting: Physical Therapy

## 2018-01-10 ENCOUNTER — Ambulatory Visit: Payer: Medicare Other | Attending: *Deleted | Admitting: Physical Therapy

## 2018-01-10 DIAGNOSIS — R2681 Unsteadiness on feet: Secondary | ICD-10-CM | POA: Diagnosis not present

## 2018-01-10 NOTE — Therapy (Signed)
Tanya Lucero, [place], [address] Phone: [phone]   Fax:  [phone]  Physical Therapy Treatment  Patient Details  Name: Tanya Lucero MRN: [id number] Date of Birth: 1951-08-09 Referring Provider: [name] DO   Encounter Date: 01/10/2018  PT End of Session - 01/10/18 1033    Visit Number  4    Number of Visits  12    Date for PT Re-Evaluation  03/14/18    PT Start Time  1030    PT Stop Time  1117    PT Time Calculation (min)  47 min    Activity Tolerance  Patient tolerated treatment well    Behavior During Therapy  [facility] for tasks assessed/performed       Past Medical History:  Diagnosis Date  . Anemia   . Anxiety   . Arthritis   . Chronic kidney disease    neuphrotic syndrome-05/2013  . Constipation   . Decreased vision    left eye  . Depression   . Diabetes mellitus    Type 2  . Diabetic neuropathy ([place])   . Diverticulitis   . Dry skin   . GERD (gastroesophageal reflux disease)   . GI bleed 07/05/2017  . Gout   . Headache   . Hypertension   . Neuropathy   . Shortness of breath dyspnea   . Skin cancer     Past Surgical History:  Procedure Laterality Date  . ABDOMINAL HYSTERECTOMY    . ADENOIDECTOMY    . APPENDECTOMY     taken out with hysterectomy  . AV FISTULA PLACEMENT Right 09/13/2014   Procedure: Creation of Right Arm RADIOCEPHALIC ARTERIOVENOUS (AV) FISTULA ;  Surgeon: [name], MD;  Location: [facility] [facility];  Service: Vascular;  Laterality: Right;  . BACK SURGERY     lumbar -   . CATARACT EXTRACTION W/PHACO Left 07/02/2013   Procedure: CATARACT EXTRACTION PHACO AND INTRAOCULAR LENS PLACEMENT (IOC);  Surgeon: [name], MD;  [facility];  Service: Ophthalmology;  Laterality: Left;  CDE:  6.46  . CATARACT EXTRACTION W/PHACO Right 07/12/2013   Procedure: CATARACT EXTRACTION PHACO AND INTRAOCULAR LENS PLACEMENT (IOC);  Surgeon: [name], MD;  [facility];  Service:  Ophthalmology;  Laterality: Right;  CDE:12.48  . COLONOSCOPY N/A 12/11/2015   Procedure: COLONOSCOPY;  Surgeon: [name], MD;  [facility];  Service: Endoscopy;  Laterality: N/A;  1:55  . ESOPHAGOGASTRODUODENOSCOPY N/A 03/11/2016   Procedure: ESOPHAGOGASTRODUODENOSCOPY (EGD);  Surgeon: [name], MD;  [facility];  Service: Endoscopy;  Laterality: N/A;  1230 - Last pt of the day-Dr. Laural Golden going to office to see pt's  . ESOPHAGOGASTRODUODENOSCOPY N/A 07/21/2017   Procedure: ESOPHAGOGASTRODUODENOSCOPY (EGD);  Surgeon: [name], MD;  [facility];  Service: Endoscopy;  Laterality: N/A;  3:00  . FOOT SURGERY Right    x2-heel spur   . GIVENS CAPSULE STUDY N/A 11/09/2016   Procedure: GIVENS CAPSULE STUDY;  Surgeon: [name], MD;  [facility];  Service: Endoscopy;  Laterality: N/A;  7:30  . REVISON OF ARTERIOVENOUS FISTULA Right 01/05/2016   Procedure: REVISON OF ARTERIOVENOUS FISTULA- right forearm;  Surgeon: [name], MD;  Location: [facility];  Service: Vascular;  Laterality: Right;  . SKIN CANCER EXCISION     forehead  . TONSILLECTOMY      There were no vitals filed for this visit.  Subjective Assessment - 01/10/18 1033  Subjective  "I'm sleepy."    Pertinent History  CKD stage 4; HTN, DM, lumbar disc 1991, depression, anxiety    Patient Stated Goals  to stop falling    Currently in Pain?  No/denies         [facility] PT Assessment - 01/10/18 0001      Assessment   Medical Diagnosis  poor balance; diabetic neuropathy    Onset Date/Surgical Date  11/22/17    Hand Dominance  Right    Next MD Visit  [name]      Precautions   Precautions  Other (comment)    Precaution Comments  R wrist sprain                   [facility] Adult PT Treatment/Exercise - 01/10/18 0001      Knee/Hip Exercises: Aerobic   Nustep  L3 x15 mins seat 8      Knee/Hip Exercises: Standing   Forward Step Up  Both;2 sets;10  reps;Hand Hold: 2;Step Height: 6"      Knee/Hip Exercises: Seated   Long Arc Quad  Strengthening;Both;1 set;10 reps;Weights    Long Arc Quad Weight  2 lbs.    Clamshell with TheraBand  Red x20    Marching  Strengthening;Both;2 sets;10 reps;Weights    Marching Weights  2 lbs.    Hamstring Curl  Both;2 sets;10 reps    Hamstring Limitations  red TB          Balance Exercises - 01/10/18 1105      Balance Exercises: Standing   SLS  Eyes open;Solid surface;Intermittent upper extremity support;4 reps;Time 7-13 seconds    SLS with Vectors  Solid surface;Upper extremity assist 1;1 rep;10 secs color pod tapping    Step Over Hurdles / Cones  stepping over colored pods 2 UE support x10             PT Long Term Goals - 12/20/17 1424      PT LONG TERM GOAL #1   Title  Patient to improve BERG to 48/54 to reduce fall risk.    Time  6    Period  Weeks    Status  New    Target Date  01/31/18      PT LONG TERM GOAL #2   Title  Patient to report no falls.    Time  6    Period  Weeks    Status  New            Plan - 01/10/18 1117    Clinical Impression Statement  Patient was able to tolerate treatment well however patient noted with increased LE fatigue during TEs and balance activities. Patient still requires close supervision/contact guard assist to maintain balance. Patient provided with seated rest breaks. Patient reported an improvement with balance however patient still reports near falls and loss of stability without external support. Patient stated still requries external support from rollator or from family member during community ambulation.     Clinical Presentation  Stable    Clinical Decision Making  Low    Rehab Potential  Good    Clinical Impairments Affecting Rehab Potential  neuropathy, R wrist sprain, CKD stage 4    PT Frequency  2x / week    PT Duration  6 weeks    PT Treatment/Interventions  ADLs/Self Care Home Management;Gait training;Therapeutic  exercise;Therapeutic activities;Neuromuscular re-education;Balance training;Patient/family education    PT Next Visit Plan  Assess goals; Nustep,  Balance, gait training with cane,  sit to stands.    Consulted and Agree with Plan of Care  Patient       Patient will benefit from skilled therapeutic intervention in order to improve the following deficits and impairments:  Decreased balance, Postural dysfunction  Visit Diagnosis: Unsteadiness on feet     Problem List Patient Active Problem List   Diagnosis Date Noted  . Guaiac positive stools 07/19/2017  . GI bleed 07/05/2017  . Iron deficiency anemia due to chronic blood loss 10/27/2016  . RENAL INSUFFICIENCY 03/04/2010  . DM 02/17/2010  . HYPERTENSION 02/17/2010  . OTHER MALAISE AND FATIGUE 02/17/2010  . SHORTNESS OF BREATH 02/17/2010  . CHEST PAIN, PRECORDIAL 02/17/2010   Gabriela Eves, PT, DPT 01/10/2018, 11:49 AM  [facility] [address] [place], [place], [address] Phone: [phone]   Fax:  [phone]  Name: GINNI EICHLER MRN: [id number] Date of Birth: February 21, 1951

## 2018-01-12 ENCOUNTER — Ambulatory Visit: Payer: Medicare Other | Admitting: *Deleted

## 2018-01-12 DIAGNOSIS — R2681 Unsteadiness on feet: Secondary | ICD-10-CM

## 2018-01-12 NOTE — Therapy (Signed)
[facility] [place], [place], [address] Phone: [phone]   Fax:  [phone]  Physical Therapy Treatment  Patient Details  Name: Tanya Lucero MRN: [id number] Date of Birth: Oct 25, 1950 Referring Provider: [name] DO   Encounter Date: 01/12/2018  PT End of Session - 01/12/18 1205    Visit Number  5    Number of Visits  12    Date for PT Re-Evaluation  03/14/18    PT Start Time  1115    PT Stop Time  2542    PT Time Calculation (min)  49 min       Past Medical History:  Diagnosis Date  . Anemia   . Anxiety   . Arthritis   . Chronic kidney disease    neuphrotic syndrome-05/2013  . Constipation   . Decreased vision    left eye  . Depression   . Diabetes mellitus    Type 2  . Diabetic neuropathy ([place])   . Diverticulitis   . Dry skin   . GERD (gastroesophageal reflux disease)   . GI bleed 07/05/2017  . Gout   . Headache   . Hypertension   . Neuropathy   . Shortness of breath dyspnea   . Skin cancer     Past Surgical History:  Procedure Laterality Date  . ABDOMINAL HYSTERECTOMY    . ADENOIDECTOMY    . APPENDECTOMY     taken out with hysterectomy  . AV FISTULA PLACEMENT Right 09/13/2014   Procedure: Creation of Right Arm RADIOCEPHALIC ARTERIOVENOUS (AV) FISTULA ;  Surgeon: [name], MD;  Location: [facility];  Service: Vascular;  Laterality: Right;  . BACK SURGERY     lumbar -   . CATARACT EXTRACTION W/PHACO Left 07/02/2013   Procedure: CATARACT EXTRACTION PHACO AND INTRAOCULAR LENS PLACEMENT (IOC);  Surgeon: [name], MD;  [facility];  Service: Ophthalmology;  Laterality: Left;  CDE:  6.46  . CATARACT EXTRACTION W/PHACO Right 07/12/2013   Procedure: CATARACT EXTRACTION PHACO AND INTRAOCULAR LENS PLACEMENT (IOC);  Surgeon: [name], MD;  [facility];  Service: Ophthalmology;  Laterality: Right;  CDE:12.48  . COLONOSCOPY N/A 12/11/2015   Procedure: COLONOSCOPY;  Surgeon: [name], MD;  [facility];  Service: Endoscopy;  Laterality: N/A;  1:55  . ESOPHAGOGASTRODUODENOSCOPY N/A 03/11/2016   Procedure: ESOPHAGOGASTRODUODENOSCOPY (EGD);  Surgeon: [name], MD;  [facility];  Service: Endoscopy;  Laterality: N/A;  1230 - Last pt of the day-Dr. Laural Golden going to office to see pt's  . ESOPHAGOGASTRODUODENOSCOPY N/A 07/21/2017   Procedure: ESOPHAGOGASTRODUODENOSCOPY (EGD);  Surgeon: [name], MD;  [facility];  Service: Endoscopy;  Laterality: N/A;  3:00  . FOOT SURGERY Right    x2-heel spur   . GIVENS CAPSULE STUDY N/A 11/09/2016   Procedure: GIVENS CAPSULE STUDY;  Surgeon: [name], MD;  [facility];  Service: Endoscopy;  Laterality: N/A;  7:30  . REVISON OF ARTERIOVENOUS FISTULA Right 01/05/2016   Procedure: REVISON OF ARTERIOVENOUS FISTULA- right forearm;  Surgeon: [name], MD;  Location: [place];  Service: Vascular;  Laterality: Right;  . SKIN CANCER EXCISION     forehead  . TONSILLECTOMY      There were no vitals filed for this visit.                    [facility] Adult PT Treatment/Exercise - 01/12/18 0001  Exercises   Exercises  Knee/Hip      Knee/Hip Exercises: Aerobic   Nustep  L3 x15 mins seat 8      Knee/Hip Exercises: Standing   Forward Step Up  Both;2 sets;10 reps;Hand Hold: 2;Step Height: 6"      Knee/Hip Exercises: Seated   Long Arc Quad  Strengthening;Both;10 reps;Weights;2 sets    Long Arc Quad Weight  2 lbs.    Clamshell with TheraBand  Red x20    Marching  --    Hamstring Curl  Both;2 sets;10 reps    Hamstring Limitations  red TB          Balance Exercises - 01/12/18 1140      Balance Exercises: Standing   SLS  Eyes open;Solid surface;Intermittent upper extremity support;4 reps;Time 7-13 seconds    SLS with Vectors  Solid surface;Upper extremity assist 1;1 rep;10 secs color pod tapping    Step Over Hurdles / Cones  stepping over colored pods 2 UE  support x10             PT Long Term Goals - 12/20/17 1424      PT LONG TERM GOAL #1   Title  Patient to improve BERG to 48/54 to reduce fall risk.    Time  6    Period  Weeks    Status  New    Target Date  01/31/18      PT LONG TERM GOAL #2   Title  Patient to report no falls.    Time  6    Period  Weeks    Status  New            Plan - 01/12/18 1347    Clinical Impression Statement  Pt arrived today with a little more energy and was able to complete everything with decreased rest times. Pt still requires SBA/ CGA during balance act.'s. SLS was still the most challenging    Clinical Presentation  Stable    Clinical Decision Making  Low    Rehab Potential  Good    Clinical Impairments Affecting Rehab Potential  neuropathy, R wrist sprain, CKD stage 4    PT Frequency  2x / week    PT Duration  6 weeks    PT Treatment/Interventions  ADLs/Self Care Home Management;Gait training;Therapeutic exercise;Therapeutic activities;Neuromuscular re-education;Balance training;Patient/family education    PT Next Visit Plan  Assess goals; Nustep,  Balance, gait training with cane, sit to stands.    PT Home Exercise Plan  sit to stand; SLS at counter    Consulted and Agree with Plan of Care  Patient       Patient will benefit from skilled therapeutic intervention in order to improve the following deficits and impairments:  Decreased balance, Postural dysfunction  Visit Diagnosis: Unsteadiness on feet     Problem List Patient Active Problem List   Diagnosis Date Noted  . Guaiac positive stools 07/19/2017  . GI bleed 07/05/2017  . Iron deficiency anemia due to chronic blood loss 10/27/2016  . RENAL INSUFFICIENCY 03/04/2010  . DM 02/17/2010  . HYPERTENSION 02/17/2010  . OTHER MALAISE AND FATIGUE 02/17/2010  . SHORTNESS OF BREATH 02/17/2010  . CHEST PAIN, PRECORDIAL 02/17/2010    Juvon Teater,CHRIS, PTA 01/12/2018, 4:49 PM  [facility] [place], [place], [address] Phone: [phone]   Fax:  [phone]  Name: SHAQUASHA GERSTEL MRN: [id number] Date of Birth: 20-Jan-1951

## 2018-01-19 ENCOUNTER — Ambulatory Visit: Payer: Medicare Other | Admitting: Physical Therapy

## 2018-01-19 ENCOUNTER — Encounter: Payer: Self-pay | Admitting: Physical Therapy

## 2018-01-19 DIAGNOSIS — R2681 Unsteadiness on feet: Secondary | ICD-10-CM

## 2018-01-19 NOTE — Patient Instructions (Signed)
  Toe-Up (Ankle Plantar Flexion and Dorsiflexion)   Holding a stable object, rise up on toes. Hold ____ seconds. Then rock back on heels and Hold 2____ seconds. Repeat _5-10___ times. Do _1-2___ sessions per day.  http://gt2.exer.us/409   Copyright  VHI. All rights reserved.  High Stepping   Using support, lift knees, taking high steps. Repeat __5-10__ times. Do __1-2__ sessions per day.  http://gt2.exer.us/533   Copyright  VHI. All rights reserved.  Walk to the Side   Step to the side with stronger leg and follow with involved leg. Then return. Hold chair if necessary. Repeat __5-10__ times. Do _1-2___ sessions per day.   Seated Hip Abduction with TB  Begin in a seated position with good posture and  both feet on the floor.   Tie an exercise band snugly around the knees, with both knees touching in the middle.  Slowly push your knees outward into the band. Hold, then return to the center.  Perform 1-2 times day

## 2018-01-19 NOTE — Therapy (Signed)
[facility] [place], [place], [address] Phone: [phone]   Fax:  [phone]  Physical Therapy Treatment  Patient Details  Name: Tanya Lucero MRN: [id number] Date of Birth: [birth year] Referring Provider: [name] DO   Encounter Date: 01/19/2018  PT End of Session - 01/19/18 1117    Visit Number  6    Number of Visits  12    Date for PT Re-Evaluation  03/14/18    PT Start Time  1031    PT Stop Time  1115    PT Time Calculation (min)  44 min    Activity Tolerance  Patient tolerated treatment well    Behavior During Therapy  [facility] for tasks assessed/performed       Past Medical History:  Diagnosis Date  . Anemia   . Anxiety   . Arthritis   . Chronic kidney disease    neuphrotic syndrome-05/2013  . Constipation   . Decreased vision    left eye  . Depression   . Diabetes mellitus    Type 2  . Diabetic neuropathy ([place])   . Diverticulitis   . Dry skin   . GERD (gastroesophageal reflux disease)   . GI bleed 07/05/2017  . Gout   . Headache   . Hypertension   . Neuropathy   . Shortness of breath dyspnea   . Skin cancer     Past Surgical History:  Procedure Laterality Date  . ABDOMINAL HYSTERECTOMY    . ADENOIDECTOMY    . APPENDECTOMY     taken out with hysterectomy  . AV FISTULA PLACEMENT Right 09/13/2014   Procedure: Creation of Right Arm RADIOCEPHALIC ARTERIOVENOUS (AV) FISTULA ;  Surgeon: [name], MD;  Location: [place];  Service: Vascular;  Laterality: Right;  . BACK SURGERY     lumbar -   . CATARACT EXTRACTION W/PHACO Left 07/02/2013   Procedure: CATARACT EXTRACTION PHACO AND INTRAOCULAR LENS PLACEMENT (IOC);  Surgeon: [name], MD;  [facility];  Service: Ophthalmology;  Laterality: Left;  CDE:  6.46  . CATARACT EXTRACTION W/PHACO Right 07/12/2013   Procedure: CATARACT EXTRACTION PHACO AND INTRAOCULAR LENS PLACEMENT (IOC);  Surgeon: [name], MD;  [facility];  Service:  Ophthalmology;  Laterality: Right;  CDE:12.48  . COLONOSCOPY N/A 12/11/2015   Procedure: COLONOSCOPY;  Surgeon: [name], MD;  [facility];  Service: Endoscopy;  Laterality: N/A;  1:55  . ESOPHAGOGASTRODUODENOSCOPY N/A 03/11/2016   Procedure: ESOPHAGOGASTRODUODENOSCOPY (EGD);  Surgeon: [name], MD;  [facility];  Service: Endoscopy;  Laterality: N/A;  1230 - Last pt of the day-Dr. Laural Golden going to office to see pt's  . ESOPHAGOGASTRODUODENOSCOPY N/A 07/21/2017   Procedure: ESOPHAGOGASTRODUODENOSCOPY (EGD);  Surgeon: [name], MD;  [facility];  Service: Endoscopy;  Laterality: N/A;  3:00  . FOOT SURGERY Right    x2-heel spur   . GIVENS CAPSULE STUDY N/A 11/09/2016   Procedure: GIVENS CAPSULE STUDY;  Surgeon: [name], MD;  [facility];  Service: Endoscopy;  Laterality: N/A;  7:30  . REVISON OF ARTERIOVENOUS FISTULA Right 01/05/2016   Procedure: REVISON OF ARTERIOVENOUS FISTULA- right forearm;  Surgeon: [name], MD;  Location: [place];  Service: Vascular;  Laterality: Right;  . SKIN CANCER EXCISION     forehead  . TONSILLECTOMY      There were no vitals filed for this visit.  Subjective Assessment - 01/19/18 1040  Subjective  Patient arrived and reported some ongoing fatigue, yet did well after last treatment    Pertinent History  CKD stage 4; HTN, DM, lumbar disc 1991, depression, anxiety    Patient Stated Goals  to stop falling    Currently in Pain?  No/denies                       [facility] Adult PT Treatment/Exercise - 01/19/18 0001      Knee/Hip Exercises: Aerobic   Nustep  L4 x15 mins seat 8      Knee/Hip Exercises: Seated   Long Arc Quad  Strengthening;Both;10 reps;Weights;3 sets    [employer] Weight  2 lbs.    Clamshell with TheraBand  Red 30    Marching  Strengthening;Both;2 sets;10 reps;Weights    Marching Weights  2 lbs.          Balance Exercises - 01/19/18 1054       Balance Exercises: Standing   Standing Eyes Opened  Wide (BOA);Foam/compliant surface;Time 39min    Standing Eyes Closed  --    Step Ups  4 inch;Forward;UE support 2    Marching Limitations  2x10    Heel Raises Limitations  2x10    Toe Raise Limitations  2x10    Sit to Stand Time  x10        PT Education - 01/19/18 1115    Education provided  Yes    Education Details  HEP    Person(s) Educated  Patient    Methods  Explanation;Demonstration;Handout    Comprehension  Verbalized understanding          PT Long Term Goals - 01/19/18 1118      PT LONG TERM GOAL #1   Title  Patient to improve BERG to 48/54 to reduce fall risk.    Time  6    Period  Weeks    Status  On-going      PT LONG TERM GOAL #2   Title  Patient to report no falls.    Time  6    Period  Weeks    Status  On-going            Plan - 01/19/18 1118    Clinical Impression Statement  Patient tolerated treatment well today. Patient had moments of fatigue and required rest break. Patient able to progress with strength and balance exercises today and HEP issued. Patient has limitations from dialysis 3 times a week causing extream fatigue. Patient has reported no falls and minimal LOB today. Goals ongoing.     Rehab Potential  Good    Clinical Impairments Affecting Rehab Potential  neuropathy, R wrist sprain, CKD stage 4    PT Frequency  2x / week    PT Duration  6 weeks    PT Treatment/Interventions  ADLs/Self Care Home Management;Gait training;Therapeutic exercise;Therapeutic activities;Neuromuscular re-education;Balance training;Patient/family education    PT Next Visit Plan  cont with POC Nustep,  Balance, gait training with cane, sit to stands.    Consulted and Agree with Plan of Care  Patient       Patient will benefit from skilled therapeutic intervention in order to improve the following deficits and impairments:  Decreased balance, Postural dysfunction  Visit Diagnosis: Unsteadiness on  feet     Problem List Patient Active Problem List   Diagnosis Date Noted  . Guaiac positive stools 07/19/2017  . GI bleed 07/05/2017  . Iron deficiency anemia due to  chronic blood loss 10/27/2016  . RENAL INSUFFICIENCY 03/04/2010  . DM 02/17/2010  . HYPERTENSION 02/17/2010  . OTHER MALAISE AND FATIGUE 02/17/2010  . SHORTNESS OF BREATH 02/17/2010  . CHEST PAIN, PRECORDIAL 02/17/2010    Haydin Dunn P, PTA 01/19/2018, 11:24 AM  [facility] [place], [place], [address] Phone: [phone]   Fax:  [phone]  Name: Tanya Lucero MRN: [id number] Date of Birth: [birth year]

## 2018-01-24 ENCOUNTER — Encounter: Payer: Medicare Other | Admitting: *Deleted

## 2018-01-31 ENCOUNTER — Ambulatory Visit: Payer: Medicare Other | Admitting: *Deleted

## 2018-01-31 DIAGNOSIS — R2681 Unsteadiness on feet: Secondary | ICD-10-CM | POA: Diagnosis not present

## 2018-01-31 NOTE — Therapy (Signed)
[facility] [place], [place], [address] Phone: [phone]   Fax:  [phone]  Physical Therapy Treatment  Patient Details  Name: Tanya Lucero MRN: [id number] Date of Birth: Jan 28, 1951 Referring Provider: [name] DO   Encounter Date: 01/31/2018  PT End of Session - 01/31/18 1207    Visit Number  7    Number of Visits  12    Date for PT Re-Evaluation  03/14/18    PT Start Time  1117    PT Stop Time  3382    PT Time Calculation (min)  47 min       Past Medical History:  Diagnosis Date  . Anemia   . Anxiety   . Arthritis   . Chronic kidney disease    neuphrotic syndrome-05/2013  . Constipation   . Decreased vision    left eye  . Depression   . Diabetes mellitus    Type 2  . Diabetic neuropathy ([place])   . Diverticulitis   . Dry skin   . GERD (gastroesophageal reflux disease)   . GI bleed 07/05/2017  . Gout   . Headache   . Hypertension   . Neuropathy   . Shortness of breath dyspnea   . Skin cancer     Past Surgical History:  Procedure Laterality Date  . ABDOMINAL HYSTERECTOMY    . ADENOIDECTOMY    . APPENDECTOMY     taken out with hysterectomy  . AV FISTULA PLACEMENT Right 09/13/2014   Procedure: Creation of Right Arm RADIOCEPHALIC ARTERIOVENOUS (AV) FISTULA ;  Surgeon: [name], MD;  Location: [place];  Service: Vascular;  Laterality: Right;  . BACK SURGERY     lumbar -   . CATARACT EXTRACTION W/PHACO Left 07/02/2013   Procedure: CATARACT EXTRACTION PHACO AND INTRAOCULAR LENS PLACEMENT (IOC);  Surgeon: [name], MD;  [facility];  Service: Ophthalmology;  Laterality: Left;  CDE:  6.46  . CATARACT EXTRACTION W/PHACO Right 07/12/2013   Procedure: CATARACT EXTRACTION PHACO AND INTRAOCULAR LENS PLACEMENT (IOC);  Surgeon: [name], MD;  [facility];  Service: Ophthalmology;  Laterality: Right;  CDE:12.48  . COLONOSCOPY N/A 12/11/2015   Procedure: COLONOSCOPY;  Surgeon: [name], MD;  [facility];  Service: Endoscopy;  Laterality: N/A;  1:55  . ESOPHAGOGASTRODUODENOSCOPY N/A 03/11/2016   Procedure: ESOPHAGOGASTRODUODENOSCOPY (EGD);  Surgeon: [name], MD;  [facility];  Service: Endoscopy;  Laterality: N/A;  1230 - Last pt of the day-Dr. Laural Golden going to office to see pt's  . ESOPHAGOGASTRODUODENOSCOPY N/A 07/21/2017   Procedure: ESOPHAGOGASTRODUODENOSCOPY (EGD);  Surgeon: [name], MD;  [facility];  Service: Endoscopy;  Laterality: N/A;  3:00  . FOOT SURGERY Right    x2-heel spur   . GIVENS CAPSULE STUDY N/A 11/09/2016   Procedure: GIVENS CAPSULE STUDY;  Surgeon: [name], MD;  [facility];  Service: Endoscopy;  Laterality: N/A;  7:30  . REVISON OF ARTERIOVENOUS FISTULA Right 01/05/2016   Procedure: REVISON OF ARTERIOVENOUS FISTULA- right forearm;  Surgeon: [name], MD;  Location: [place];  Service: Vascular;  Laterality: Right;  . SKIN CANCER EXCISION     forehead  . TONSILLECTOMY      There were no vitals filed for this visit.                    [place] Adult PT Treatment/Exercise - 01/31/18 0001  Exercises   Exercises  Knee/Hip      Knee/Hip Exercises: Aerobic   Nustep  L4 x16 mins seat 8      Knee/Hip Exercises: Standing   Forward Step Up  --      Knee/Hip Exercises: Seated   Long Arc Quad  Strengthening;Both;10 reps;Weights;3 sets    Long Arc Quad Weight  3 lbs.    Clamshell with [employer]raBand  -- 30    Marching  Strengthening;Both;2 sets;10 reps;Weights          Balance Exercises - 01/31/18 1130      Balance Exercises: Standing   Standing Eyes Opened  Wide (BOA);Foam/compliant surface;Time 64min    Step Ups  4 inch;Forward;UE support 2    Marching Limitations  2x10    Heel Raises Limitations  2x10    Toe Raise Limitations  2x10    Sit to Stand Time  x10             PT Long Term Goals - 01/19/18 1118      PT LONG TERM GOAL #1   Title   Patient to improve BERG to 48/54 to reduce fall risk.    Time  6    Period  Weeks    Status  On-going      PT LONG TERM GOAL #2   Title  Patient to report no falls.    Time  6    Period  Weeks    Status  On-going            Plan - 01/31/18 1208    Clinical Impression Statement  Pt arrived today feeling ok, but tired. She was able to complete all strengthening act.'s as well as balance with mainly fatigue complaints. She required 2 sitting rest breaks during standing act.'s.Marland Kitchen Pt did also complain of numbness in her RT hand while using //bars due to neuropathy    Clinical Presentation  Stable    Clinical Decision Making  Low    Rehab Potential  Good    Clinical Impairments Affecting Rehab Potential  neuropathy, R wrist sprain, CKD stage 4    PT Frequency  2x / week    PT Duration  6 weeks    PT Treatment/Interventions  ADLs/Self Care Home Management;Gait training;Therapeutic exercise;Therapeutic activities;Neuromuscular re-education;Balance training;Patient/family education    PT Next Visit Plan  cont with POC Nustep,  Balance, gait training with cane, sit to stands.    PT Home Exercise Plan  sit to stand; SLS at counter    Consulted and Agree with Plan of Care  Patient       Patient will benefit from skilled therapeutic intervention in order to improve the following deficits and impairments:  Decreased balance, Postural dysfunction  Visit Diagnosis: Unsteadiness on feet     Problem List Patient Active Problem List   Diagnosis Date Noted  . Guaiac positive stools 07/19/2017  . GI bleed 07/05/2017  . Iron deficiency anemia due to chronic blood loss 10/27/2016  . RENAL INSUFFICIENCY 03/04/2010  . DM 02/17/2010  . HYPERTENSION 02/17/2010  . OTHER MALAISE AND FATIGUE 02/17/2010  . SHORTNESS OF BREATH 02/17/2010  . CHEST PAIN, PRECORDIAL 02/17/2010    RAMSEUR,CHRIS, PTA 01/31/2018, 1:01 PM  [facility] [place], [place], [address] Phone: [phone]   Fax:  [phone]  Name: GENESYS COGGESHALL MRN: [id number] Date of Birth: 01-24-1951

## 2018-02-01 LAB — HEMOGLOBIN AND HEMATOCRIT, BLOOD
HEMATOCRIT: 26.3 % — AB (ref 35.0–45.0)
Hemoglobin: 8.9 g/dL — ABNORMAL LOW (ref 11.7–15.5)

## 2018-02-02 ENCOUNTER — Ambulatory Visit: Payer: Medicare Other | Admitting: *Deleted

## 2018-02-02 DIAGNOSIS — R2681 Unsteadiness on feet: Secondary | ICD-10-CM | POA: Diagnosis not present

## 2018-02-02 NOTE — Therapy (Signed)
[facility] [address] [place], [place] [address] Phone: [phone]   Fax:  [phone]  Physical Therapy Treatment  Patient Details  Name: Tanya Lucero MRN: [id number] Date of Birth: 05/23/1951 Referring Provider: Cynthia Butler DO   Encounter Date: 02/02/2018  PT End of Session - 02/02/18 0915    Visit Number  8    Number of Visits  12    Date for PT Re-Evaluation  03/14/18    PT Start Time  0900    PT Stop Time  0950    PT Time Calculation (min)  50 min       Past Medical History:  Diagnosis Date  . Anemia   . Anxiety   . Arthritis   . Chronic kidney disease    neuphrotic syndrome-05/2013  . Constipation   . Decreased vision    left eye  . Depression   . Diabetes mellitus    Type 2  . Diabetic neuropathy ([facility])   . Diverticulitis   . Dry skin   . GERD (gastroesophageal reflux disease)   . GI bleed 07/05/2017  . Gout   . Headache   . Hypertension   . Neuropathy   . Shortness of breath dyspnea   . Skin cancer     Past Surgical History:  Procedure Laterality Date  . ABDOMINAL HYSTERECTOMY    . ADENOIDECTOMY    . APPENDECTOMY     taken out with hysterectomy  . AV FISTULA PLACEMENT Right 09/13/2014   Procedure: Creation of Right Arm RADIOCEPHALIC ARTERIOVENOUS (AV) FISTULA ;  Surgeon: Todd F Early, MD;  [facility];  Service: Vascular;  Laterality: Right;  . BACK SURGERY     lumbar -   . CATARACT EXTRACTION W/PHACO Left 07/02/2013   Procedure: CATARACT EXTRACTION PHACO AND INTRAOCULAR LENS PLACEMENT (IOC);  Surgeon: Kerry Hunt, MD;  [facility];  Service: Ophthalmology;  Laterality: Left;  CDE:  6.46  . CATARACT EXTRACTION W/PHACO Right 07/12/2013   Procedure: CATARACT EXTRACTION PHACO AND INTRAOCULAR LENS PLACEMENT (IOC);  Surgeon: Kerry Hunt, MD;  [facility];  Service: Ophthalmology;  Laterality: Right;  CDE:12.48  . COLONOSCOPY N/A 12/11/2015   Procedure: COLONOSCOPY;  Surgeon: Najeeb U  Rehman, MD;  [facility];  Service: Endoscopy;  Laterality: N/A;  1:55  . ESOPHAGOGASTRODUODENOSCOPY N/A 03/11/2016   Procedure: ESOPHAGOGASTRODUODENOSCOPY (EGD);  Surgeon: Najeeb U Rehman, MD;  [facility];  Service: Endoscopy;  Laterality: N/A;  1230 - Last pt of the day-Dr. Rehman going to office to see pt's  . ESOPHAGOGASTRODUODENOSCOPY N/A 07/21/2017   Procedure: ESOPHAGOGASTRODUODENOSCOPY (EGD);  Surgeon: Rehman, Najeeb U, MD;  [facility];  Service: Endoscopy;  Laterality: N/A;  3:00  . FOOT SURGERY Right    x2-heel spur   . GIVENS CAPSULE STUDY N/A 11/09/2016   Procedure: GIVENS CAPSULE STUDY;  Surgeon: Najeeb U Rehman, MD;  [facility];  Service: Endoscopy;  Laterality: N/A;  7:30  . REVISON OF ARTERIOVENOUS FISTULA Right 01/05/2016   Procedure: REVISON OF ARTERIOVENOUS FISTULA- right forearm;  Surgeon: Todd F Early, MD;  [facility];  Service: Vascular;  Laterality: Right;  . SKIN CANCER EXCISION     forehead  . TONSILLECTOMY      There were no vitals filed for this visit.  Subjective Assessment - 02/02/18 0914    Subjective  Feeling fairly well today.    Pertinent History  CKD stage 4; HTN, DM, lumbar disc   1991, depression, anxiety    Patient Stated Goals  to stop falling    Currently in Pain?  No/denies         [facility] PT Assessment - 02/02/18 0001      Berg Balance Test   Sit to Stand  Able to stand without using hands and stabilize independently    Standing Unsupported  Able to stand safely 2 minutes    Sitting with Back Unsupported but Feet Supported on Floor or Stool  Able to sit safely and securely 2 minutes    Stand to Sit  Sits safely with minimal use of hands    Transfers  Able to transfer safely, minor use of hands    Standing Unsupported with Eyes Closed  Able to stand 10 seconds safely    Standing Ubsupported with Feet Together  Able to place feet together independently but unable to hold for 30 seconds    From  Standing, Reach Forward with Outstretched Arm  Can reach forward >12 cm safely (5")    From Standing Position, Pick up Object from Floor  Able to pick up shoe safely and easily    From Standing Position, Turn to Look Behind Over each Shoulder  Looks behind from both sides and weight shifts well    Turn 360 Degrees  Able to turn 360 degrees safely but slowly    Standing Unsupported, Alternately Place Feet on Step/Stool  Able to complete 4 steps without aid or supervision    Standing Unsupported, One Foot in Front  Needs help to step but can hold 15 seconds    Standing on One Leg  Tries to lift leg/unable to hold 3 seconds but remains standing independently    Total Score  43                   [facility] Adult PT Treatment/Exercise - 02/02/18 0001      Exercises   Exercises  Knee/Hip      Knee/Hip Exercises: Aerobic   Nustep  L4 x16 mins seat 8      Knee/Hip Exercises: Seated   Long Arc Quad  Strengthening;Both;10 reps;Weights;3 sets    [employer] Weight  3 lbs.                  PT Long Term Goals - 02/02/18 0944      PT LONG TERM GOAL #1   Title  Patient to improve BERG to 48/54 to reduce fall risk.    Baseline  02-02-18 43/54    Time  6    Period  Weeks    Status  On-going      PT LONG TERM GOAL #2   Title  Patient to report no falls.    Time  6    Period  Weeks    Status  Achieved            Plan - 02/02/18 9604    Clinical Impression Statement  Pt arrived today in good spirits and was able to complete nustep without a rest break. She was also able to improve her Berg score to 43 from 38 on IEV. LTG for no falls was met today.    Clinical Presentation  Stable    Rehab Potential  Good    Clinical Impairments Affecting Rehab Potential  neuropathy, R wrist sprain, CKD stage 4    PT Frequency  2x / week    PT Duration  6 weeks  PT Treatment/Interventions  ADLs/Self Care Home Management;Gait training;Therapeutic exercise;Therapeutic  activities;Neuromuscular re-education;Balance training;Patient/family education    PT Next Visit Plan  cont with POC Nustep,  Balance, gait training with cane, sit to stands.    PT Home Exercise Plan  sit to stand; SLS at counter    Consulted and Agree with Plan of Care  Patient       Patient will benefit from skilled therapeutic intervention in order to improve the following deficits and impairments:  Decreased balance, Postural dysfunction  Visit Diagnosis: Unsteadiness on feet     Problem List Patient Active Problem List   Diagnosis Date Noted  . Guaiac positive stools 07/19/2017  . GI bleed 07/05/2017  . Iron deficiency anemia due to chronic blood loss 10/27/2016  . RENAL INSUFFICIENCY 03/04/2010  . DM 02/17/2010  . HYPERTENSION 02/17/2010  . OTHER MALAISE AND FATIGUE 02/17/2010  . SHORTNESS OF BREATH 02/17/2010  . CHEST PAIN, PRECORDIAL 02/17/2010    RAMSEUR,CHRIS, PTA 02/02/2018, 10:05 AM  [facility] [address] [place], [place], [address] Phone: [phone]   Fax:  [phone]  Name: Karolyn K Lauderback MRN: [id number] Date of Birth: 11/12/1950   

## 2018-02-06 ENCOUNTER — Other Ambulatory Visit (INDEPENDENT_AMBULATORY_CARE_PROVIDER_SITE_OTHER): Payer: Self-pay | Admitting: *Deleted

## 2018-02-06 DIAGNOSIS — D649 Anemia, unspecified: Secondary | ICD-10-CM

## 2018-02-06 DIAGNOSIS — K922 Gastrointestinal hemorrhage, unspecified: Secondary | ICD-10-CM

## 2018-02-14 ENCOUNTER — Ambulatory Visit: Payer: Medicare Other | Attending: *Deleted | Admitting: Physical Therapy

## 2018-02-14 DIAGNOSIS — R2681 Unsteadiness on feet: Secondary | ICD-10-CM | POA: Insufficient documentation

## 2018-02-14 NOTE — Therapy (Signed)
[facility] [place], [place], [address] Phone: [phone]   Fax:  [phone]  Physical Therapy Treatment  Patient Details  Name: Tanya Lucero MRN: [id number] Date of Birth: August 25, 1951 Referring Provider: [name] DO   Encounter Date: 02/14/2018  PT End of Session - 02/14/18 1118    Visit Number  9    Number of Visits  12    Date for PT Re-Evaluation  03/14/18    PT Start Time  1115    PT Stop Time  1201    PT Time Calculation (min)  46 min    Activity Tolerance  Patient tolerated treatment well    Behavior During Therapy  [facility] for tasks assessed/performed       Past Medical History:  Diagnosis Date  . Anemia   . Anxiety   . Arthritis   . Chronic kidney disease    neuphrotic syndrome-05/2013  . Constipation   . Decreased vision    left eye  . Depression   . Diabetes mellitus    Type 2  . Diabetic neuropathy ([place])   . Diverticulitis   . Dry skin   . GERD (gastroesophageal reflux disease)   . GI bleed 07/05/2017  . Gout   . Headache   . Hypertension   . Neuropathy   . Shortness of breath dyspnea   . Skin cancer     Past Surgical History:  Procedure Laterality Date  . ABDOMINAL HYSTERECTOMY    . ADENOIDECTOMY    . APPENDECTOMY     taken out with hysterectomy  . AV FISTULA PLACEMENT Right 09/13/2014   Procedure: Creation of Right Arm RADIOCEPHALIC ARTERIOVENOUS (AV) FISTULA ;  Surgeon: [name], MD;  Location: [place];  Service: Vascular;  Laterality: Right;  . BACK SURGERY     lumbar -   . CATARACT EXTRACTION W/PHACO Left 07/02/2013   Procedure: CATARACT EXTRACTION PHACO AND INTRAOCULAR LENS PLACEMENT (IOC);  Surgeon: [name], MD;  [facility];  Service: Ophthalmology;  Laterality: Left;  CDE:  6.46  . CATARACT EXTRACTION W/PHACO Right 07/12/2013   Procedure: CATARACT EXTRACTION PHACO AND INTRAOCULAR LENS PLACEMENT (IOC);  Surgeon: [name], MD;  [facility];  Service:  Ophthalmology;  Laterality: Right;  CDE:12.48  . COLONOSCOPY N/A 12/11/2015   Procedure: COLONOSCOPY;  Surgeon: [name], MD;  [facility];  Service: Endoscopy;  Laterality: N/A;  1:55  . ESOPHAGOGASTRODUODENOSCOPY N/A 03/11/2016   Procedure: ESOPHAGOGASTRODUODENOSCOPY (EGD);  Surgeon: [name], MD;  [facility];  Service: Endoscopy;  Laterality: N/A;  1230 - Last pt of the day-Dr. Laural Golden going to office to see pt's  . ESOPHAGOGASTRODUODENOSCOPY N/A 07/21/2017   Procedure: ESOPHAGOGASTRODUODENOSCOPY (EGD);  Surgeon: [name], MD;  [facility];  Service: Endoscopy;  Laterality: N/A;  3:00  . FOOT SURGERY Right    x2-heel spur   . GIVENS CAPSULE STUDY N/A 11/09/2016   Procedure: GIVENS CAPSULE STUDY;  Surgeon: [name], MD;  [facility];  Service: Endoscopy;  Laterality: N/A;  7:30  . REVISON OF ARTERIOVENOUS FISTULA Right 01/05/2016   Procedure: REVISON OF ARTERIOVENOUS FISTULA- right forearm;  Surgeon: [name], MD;  Location: [place];  Service: Vascular;  Laterality: Right;  . SKIN CANCER EXCISION     forehead  . TONSILLECTOMY      There were no vitals filed for this visit.  Subjective Assessment - 02/14/18 1119  Subjective  Patient reported feeling tired today. She was unable to come last week due to sickness and her transportation was away.    Pertinent History  CKD stage 4; HTN, DM, lumbar disc 1991, depression, anxiety    Patient Stated Goals  to stop falling    Currently in Pain?  No/denies         [facility] PT Assessment - 02/14/18 0001      Assessment   Medical Diagnosis  poor balance; diabetic neuropathy    Onset Date/Surgical Date  11/22/17    Hand Dominance  Right    Next MD Visit  [name]      Precautions   Precautions  Other (comment)    Precaution Comments  R wrist sprain                   [facility] Adult PT Treatment/Exercise - 02/14/18 0001      Exercises   Exercises   Knee/Hip      Knee/Hip Exercises: Aerobic   Nustep  Level 5 x15 mins      Knee/Hip Exercises: Standing   Rocker Board  5 minutes      Knee/Hip Exercises: Seated   Long Arc Quad  Strengthening;Both;10 reps;Weights;3 sets    Long Arc Quad Weight  3 lbs.    Clamshell with TheraBand  Red x30    Marching  Strengthening;Both;2 sets;10 reps;Weights 3#          Balance Exercises - 02/14/18 1152      Balance Exercises: Standing   Step Ups  Lateral;Forward;4 inch;UE support 2 x15 each             PT Long Term Goals - 02/02/18 0944      PT LONG TERM GOAL #1   Title  Patient to improve BERG to 48/54 to reduce fall risk.    Baseline  02-02-18 43/54    Time  6    Period  Weeks    Status  On-going      PT LONG TERM GOAL #2   Title  Patient to report no falls.    Time  6    Period  Weeks    Status  Achieved            Plan - 02/14/18 1207    Clinical Impression Statement  Patient was able to tolerate treatment well. Patient reports some difficulty with step ups secondary to back pain but was able to complete with rest. Patient and PT discussed importance of proper footwear and her sandals were adjusted as she felt they were slipping. Patient reported feeling more secure after adjustment.     Clinical Presentation  Stable    Clinical Decision Making  Low    Rehab Potential  Good    Clinical Impairments Affecting Rehab Potential  neuropathy, R wrist sprain, CKD stage 4    PT Frequency  2x / week    PT Duration  6 weeks    PT Treatment/Interventions  ADLs/Self Care Home Management;Gait training;Therapeutic exercise;Therapeutic activities;Neuromuscular re-education;Balance training;Patient/family education    PT Next Visit Plan  cont with POC Nustep,  Balance, gait training with cane, sit to stands.    Consulted and Agree with Plan of Care  Patient       Patient will benefit from skilled therapeutic intervention in order to improve the following deficits and  impairments:  Decreased balance, Postural dysfunction  Visit Diagnosis: Unsteadiness on feet     Problem List Patient  Active Problem List   Diagnosis Date Noted  . Guaiac positive stools 07/19/2017  . GI bleed 07/05/2017  . Iron deficiency anemia due to chronic blood loss 10/27/2016  . RENAL INSUFFICIENCY 03/04/2010  . DM 02/17/2010  . HYPERTENSION 02/17/2010  . OTHER MALAISE AND FATIGUE 02/17/2010  . SHORTNESS OF BREATH 02/17/2010  . CHEST PAIN, PRECORDIAL 02/17/2010    Gabriela Eves, PT, DPT 02/14/2018, 12:49 PM  [facility] [address] [place], [place], [address] Phone: [phone]   Fax:  [phone]  Name: Tanya Lucero MRN: [id number] Date of Birth: Nov 13, 1950

## 2018-02-21 ENCOUNTER — Ambulatory Visit: Payer: Medicare Other | Admitting: Physical Therapy

## 2018-02-21 DIAGNOSIS — R2681 Unsteadiness on feet: Secondary | ICD-10-CM | POA: Diagnosis not present

## 2018-02-21 NOTE — Therapy (Signed)
[facility] [place], [place], [address] Phone: [phone]   Fax:  [phone]  Physical Therapy Treatment  Patient Details  Name: Tanya Lucero MRN: [id number] Date of Birth: 1951/01/01 Referring Provider: [name] DO   Encounter Date: 02/21/2018  PT End of Session - 02/21/18 0956    Visit Number  10    Number of Visits  12    Date for PT Re-Evaluation  03/14/18    PT Start Time  0945    PT Stop Time  1031    PT Time Calculation (min)  46 min    Activity Tolerance  Patient tolerated treatment well    Behavior During Therapy  [facility] for tasks assessed/performed       Past Medical History:  Diagnosis Date  . Anemia   . Anxiety   . Arthritis   . Chronic kidney disease    neuphrotic syndrome-05/2013  . Constipation   . Decreased vision    left eye  . Depression   . Diabetes mellitus    Type 2  . Diabetic neuropathy ([place])   . Diverticulitis   . Dry skin   . GERD (gastroesophageal reflux disease)   . GI bleed 07/05/2017  . Gout   . Headache   . Hypertension   . Neuropathy   . Shortness of breath dyspnea   . Skin cancer     Past Surgical History:  Procedure Laterality Date  . ABDOMINAL HYSTERECTOMY    . ADENOIDECTOMY    . APPENDECTOMY     taken out with hysterectomy  . AV FISTULA PLACEMENT Right 09/13/2014   Procedure: Creation of Right Arm RADIOCEPHALIC ARTERIOVENOUS (AV) FISTULA ;  Surgeon: [name], MD;  Location: [place];  Service: Vascular;  Laterality: Right;  . BACK SURGERY     lumbar -   . CATARACT EXTRACTION W/PHACO Left 07/02/2013   Procedure: CATARACT EXTRACTION PHACO AND INTRAOCULAR LENS PLACEMENT (IOC);  Surgeon: [name], MD;  [facility];  Service: Ophthalmology;  Laterality: Left;  CDE:  6.46  . CATARACT EXTRACTION W/PHACO Right 07/12/2013   Procedure: CATARACT EXTRACTION PHACO AND INTRAOCULAR LENS PLACEMENT (IOC);  Surgeon: [name], MD;  [facility];  Service:  Ophthalmology;  Laterality: Right;  CDE:12.48  . COLONOSCOPY N/A 12/11/2015   Procedure: COLONOSCOPY;  Surgeon: [name], MD;  [facility];  Service: Endoscopy;  Laterality: N/A;  1:55  . ESOPHAGOGASTRODUODENOSCOPY N/A 03/11/2016   Procedure: ESOPHAGOGASTRODUODENOSCOPY (EGD);  Surgeon: [name], MD;  [facility];  Service: Endoscopy;  Laterality: N/A;  1230 - Last pt of the day-Dr. Laural Golden going to office to see pt's  . ESOPHAGOGASTRODUODENOSCOPY N/A 07/21/2017   Procedure: ESOPHAGOGASTRODUODENOSCOPY (EGD);  Surgeon: [name], MD;  [facility];  Service: Endoscopy;  Laterality: N/A;  3:00  . FOOT SURGERY Right    x2-heel spur   . GIVENS CAPSULE STUDY N/A 11/09/2016   Procedure: GIVENS CAPSULE STUDY;  Surgeon: [name], MD;  [facility];  Service: Endoscopy;  Laterality: N/A;  7:30  . REVISON OF ARTERIOVENOUS FISTULA Right 01/05/2016   Procedure: REVISON OF ARTERIOVENOUS FISTULA- right forearm;  Surgeon: [name], MD;  Location: [place];  Service: Vascular;  Laterality: Right;  . SKIN CANCER EXCISION     forehead  . TONSILLECTOMY      There were no vitals filed for this visit.  Subjective Assessment - 02/21/18 1040  Subjective  Patient reported no new complaints.     Pertinent History  CKD stage 4; HTN, DM, lumbar disc 1991, depression, anxiety    Patient Stated Goals  to stop falling    Currently in Pain?  No/denies         [facility] PT Assessment - 02/21/18 0001      Assessment   Medical Diagnosis  poor balance; diabetic neuropathy                   [facility] Adult PT Treatment/Exercise - 02/21/18 0001      Exercises   Exercises  Knee/Hip      Knee/Hip Exercises: Aerobic   Nustep  Level 5 x15 mins      Knee/Hip Exercises: Standing   Hip Flexion  Both;2 sets;10 reps;Knee bent #1 Weight    Lateral Step Up  Both;2 sets;5 reps;Hand Hold: 2;Step Height: 6" Lateral "A" Step    Rocker Board  5 minutes       Knee/Hip Exercises: Seated   Hamstring Curl  Both;2 sets;10 reps    Hamstring Limitations  red TB    Sit to Sand  2 sets;10 reps;without UE support          Balance Exercises - 02/21/18 1023      Balance Exercises: Standing   Standing Eyes Opened  Narrow base of support (BOS);Foam/compliant surface;2 reps;30 secs    Standing Eyes Closed  Narrow base of support (BOS);Foam/compliant surface;2 reps;30 secs    Tandem Stance  Eyes open;Upper extremity support 2;2 reps;30 secs             PT Long Term Goals - 02/02/18 0944      PT LONG TERM GOAL #1   Title  Patient to improve BERG to 48/54 to reduce fall risk.    Baseline  02-02-18 43/54    Time  6    Period  Weeks    Status  On-going      PT LONG TERM GOAL #2   Title  Patient to report no falls.    Time  6    Period  Weeks    Status  Achieved            Plan - 02/21/18 1036    Clinical Impression Statement  Patient was able to tolerate treatment well and has noted improvements in endurance as she reported she did not required a rest break with the nustep today. Patient only required one standing rest break throughout the session. Patient continues to have difficulties with sit to stands especially when chair is low. Patient educated importance of strengthening ankles for adequate use ankle strategies. Patient reported understanding.    Clinical Presentation  Stable    Clinical Decision Making  Low    Rehab Potential  Good    Clinical Impairments Affecting Rehab Potential  neuropathy, R wrist sprain, CKD stage 4    PT Frequency  2x / week    PT Duration  6 weeks    PT Treatment/Interventions  ADLs/Self Care Home Management;Gait training;Therapeutic exercise;Therapeutic activities;Neuromuscular re-education;Balance training;Patient/family education    PT Next Visit Plan  cont with POC Nustep,  Balance, gait training with cane, sit to stands.    PT Home Exercise Plan  sit to stand; SLS at counter    Consulted  and Agree with Plan of Care  Patient       Patient will benefit from skilled therapeutic intervention in order to improve the following deficits and  impairments:  Decreased balance, Postural dysfunction  Visit Diagnosis: Unsteadiness on feet     Problem List Patient Active Problem List   Diagnosis Date Noted  . Guaiac positive stools 07/19/2017  . GI bleed 07/05/2017  . Iron deficiency anemia due to chronic blood loss 10/27/2016  . RENAL INSUFFICIENCY 03/04/2010  . DM 02/17/2010  . HYPERTENSION 02/17/2010  . OTHER MALAISE AND FATIGUE 02/17/2010  . SHORTNESS OF BREATH 02/17/2010  . CHEST PAIN, PRECORDIAL 02/17/2010    Gabriela Eves, PT, DPT 02/21/2018, 10:42 AM  [facility] [address] [place], [place], [address] Phone: [phone]   Fax:  [phone]  Name: KENNIDY LAMKE MRN: [id number] Date of Birth: 09-19-1950

## 2018-02-27 ENCOUNTER — Encounter (INDEPENDENT_AMBULATORY_CARE_PROVIDER_SITE_OTHER): Payer: Self-pay | Admitting: *Deleted

## 2018-02-27 ENCOUNTER — Other Ambulatory Visit (INDEPENDENT_AMBULATORY_CARE_PROVIDER_SITE_OTHER): Payer: Self-pay | Admitting: *Deleted

## 2018-02-27 DIAGNOSIS — D649 Anemia, unspecified: Secondary | ICD-10-CM

## 2018-02-27 DIAGNOSIS — K922 Gastrointestinal hemorrhage, unspecified: Secondary | ICD-10-CM

## 2018-02-28 ENCOUNTER — Ambulatory Visit: Payer: Medicare Other | Admitting: Physical Therapy

## 2018-02-28 DIAGNOSIS — R2681 Unsteadiness on feet: Secondary | ICD-10-CM | POA: Diagnosis not present

## 2018-02-28 NOTE — Therapy (Addendum)
[facility] [place], [place], [address] Phone: [phone]   Fax:  [phone]  Physical Therapy Treatment PHYSICAL THERAPY DISCHARGE SUMMARY  Visits from Start of Care: 11  Current functional level related to goals / functional outcomes: See below   Remaining deficits: See goals   Education / Equipment: HEP Plan: Patient agrees to discharge.  Patient goals were partially met. Patient is being discharged due to not returning since the last visit.  ?????     Gabriela Eves, PT, DPT 06/11/19  Patient Details  Name: Tanya Lucero MRN: [id number] Date of Birth: [birth year] Referring Provider: [name] DO   Encounter Date: 02/28/2018  PT End of Session - 02/28/18 0934    Visit Number  11    Number of Visits  12    Date for PT Re-Evaluation  03/14/18    PT Start Time  0900    PT Stop Time  0948    PT Time Calculation (min)  48 min    Activity Tolerance  Patient tolerated treatment well    Behavior During Therapy  [facility] for tasks assessed/performed       Past Medical History:  Diagnosis Date  . Anemia   . Anxiety   . Arthritis   . Chronic kidney disease    neuphrotic syndrome-05/2013  . Constipation   . Decreased vision    left eye  . Depression   . Diabetes mellitus    Type 2  . Diabetic neuropathy ([place])   . Diverticulitis   . Dry skin   . GERD (gastroesophageal reflux disease)   . GI bleed 07/05/2017  . Gout   . Headache   . Hypertension   . Neuropathy   . Shortness of breath dyspnea   . Skin cancer     Past Surgical History:  Procedure Laterality Date  . ABDOMINAL HYSTERECTOMY    . ADENOIDECTOMY    . APPENDECTOMY     taken out with hysterectomy  . AV FISTULA PLACEMENT Right 09/13/2014   Procedure: Creation of Right Arm RADIOCEPHALIC ARTERIOVENOUS (AV) FISTULA ;  Surgeon: [name], MD;  Location: [place];  Service: Vascular;  Laterality: Right;  . BACK SURGERY     lumbar -   .  CATARACT EXTRACTION W/PHACO Left 07/02/2013   Procedure: CATARACT EXTRACTION PHACO AND INTRAOCULAR LENS PLACEMENT (IOC);  Surgeon: [name], MD;  [facility];  Service: Ophthalmology;  Laterality: Left;  CDE:  6.46  . CATARACT EXTRACTION W/PHACO Right 07/12/2013   Procedure: CATARACT EXTRACTION PHACO AND INTRAOCULAR LENS PLACEMENT (IOC);  Surgeon: [name], MD;  [facility];  Service: Ophthalmology;  Laterality: Right;  CDE:12.48  . COLONOSCOPY N/A 12/11/2015   Procedure: COLONOSCOPY;  Surgeon: [name], MD;  [facility];  Service: Endoscopy;  Laterality: N/A;  1:55  . ESOPHAGOGASTRODUODENOSCOPY N/A 03/11/2016   Procedure: ESOPHAGOGASTRODUODENOSCOPY (EGD);  Surgeon: [name], MD;  [facility];  Service: Endoscopy;  Laterality: N/A;  1230 - Last pt of the day-Dr. Laural Golden going to office to see pt's  . ESOPHAGOGASTRODUODENOSCOPY N/A 07/21/2017   Procedure: ESOPHAGOGASTRODUODENOSCOPY (EGD);  Surgeon: [name], MD;  [facility];  Service: Endoscopy;  Laterality: N/A;  3:00  . FOOT SURGERY Right    x2-heel spur   . GIVENS CAPSULE STUDY N/A 11/09/2016   Procedure: GIVENS CAPSULE STUDY;  Surgeon: [name], MD;  [facility];  Service: Endoscopy;  Laterality:  N/A;  7:30  . REVISON OF ARTERIOVENOUS FISTULA Right 01/05/2016   Procedure: REVISON OF ARTERIOVENOUS FISTULA- right forearm;  Surgeon: [name], MD;  Location: [place];  Service: Vascular;  Laterality: Right;  . SKIN CANCER EXCISION     forehead  . TONSILLECTOMY      There were no vitals filed for this visit.      [facility] PT Assessment - 02/28/18 0001      Assessment   Medical Diagnosis  poor balance; diabetic neuropathy                   [facility] Adult PT Treatment/Exercise - 02/28/18 0001      Exercises   Exercises  Knee/Hip      Knee/Hip Exercises: Aerobic   Nustep  Level 5 x15 mins      Knee/Hip Exercises: Standing   Other Standing Knee  Exercises  theraball downward presses to activate core 2x10 5 second hold      Knee/Hip Exercises: Seated   Long Arc Quad  Strengthening;Both;10 reps;Weights;3 sets    Long Arc Quad Weight  3 lbs.    Clamshell with TheraBand  Red x40    Marching  Strengthening;Both;3 sets;10 reps;Weights 3#          Balance Exercises - 02/28/18 0930      Balance Exercises: Standing   Rockerboard  Anterior/posterior;Other time (comment) 5 minutes    Sidestepping  Upper extremity support;5 reps    Numbers 1-15  Static;3 reps Tandem stance               PT Long Term Goals - 02/02/18 0944      PT LONG TERM GOAL #1   Title  Patient to improve BERG to 48/54 to reduce fall risk.    Baseline  02-02-18 43/54    Time  6    Period  Weeks    Status  On-going      PT LONG TERM GOAL #2   Title  Patient to report no falls.    Time  6    Period  Weeks    Status  Achieved            Plan - 02/28/18 0956    Clinical Impression Statement  Patient was able to tolerate treatment well with no loss of balance. Patient was able to perform exercises with good form and with close supervision. Patient and PT discussed plan for discharge next visit with emphasis on HEP. Patient reported agreement to plan.    Clinical Presentation  Stable    Clinical Decision Making  Low    Rehab Potential  Good    Clinical Impairments Affecting Rehab Potential  neuropathy, R wrist sprain, CKD stage 4    PT Frequency  2x / week    PT Duration  6 weeks    PT Treatment/Interventions  ADLs/Self Care Home Management;Gait training;Therapeutic exercise;Therapeutic activities;Neuromuscular re-education;Balance training;Patient/family education    PT Next Visit Plan  ASSESS BERG and ongoing goals. Discharge next visit with HEP    Consulted and Agree with Plan of Care  Patient       Patient will benefit from skilled therapeutic intervention in order to improve the following deficits and impairments:  Decreased balance,  Postural dysfunction  Visit Diagnosis: Unsteadiness on feet     Problem List Patient Active Problem List   Diagnosis Date Noted  . Guaiac positive stools 07/19/2017  . GI bleed 07/05/2017  . Iron deficiency anemia due  to chronic blood loss 10/27/2016  . RENAL INSUFFICIENCY 03/04/2010  . DM 02/17/2010  . HYPERTENSION 02/17/2010  . OTHER MALAISE AND FATIGUE 02/17/2010  . SHORTNESS OF BREATH 02/17/2010  . CHEST PAIN, PRECORDIAL 02/17/2010   Gabriela Eves, PT, DPT 02/28/2018, 12:34 PM  [facility] [address] [place], [place], [address] Phone: [phone]   Fax:  [phone]  Name: Tanya Lucero MRN: [id number] Date of Birth: Feb 13, 1951

## 2018-03-02 ENCOUNTER — Encounter: Payer: Self-pay | Admitting: Physical Therapy

## 2018-03-02 ENCOUNTER — Ambulatory Visit: Payer: Medicare Other | Admitting: Physical Therapy

## 2018-03-02 DIAGNOSIS — R2681 Unsteadiness on feet: Secondary | ICD-10-CM

## 2018-03-02 NOTE — Patient Instructions (Signed)
Pelvic Tilt: Posterior - Legs Bent (Supine)  Tighten stomach and buttock and flatten back by rolling pelvis down. Hold _10___ seconds. Relax. Repeat _10-30___ times per set. Do __2__ sets per session. Do _2___ sessions per day.   Bent Leg Lift (Hook-Lying)  Tighten stomach and buttock and slowly raise right leg _5___ inches from floor. Keep trunk rigid. Hold _3___ seconds. Repeat _10___ times per set. Do ___2-3_ sets per session. Do __2__ sessions per day.   Heel Raise (Sitting)    Raise heels, keeping toes on floor.  Tighten abdomin and buttock (draw in) Repeat __10__ times per set. Do __1-2__ sets per session. Do __1-2__ sessions per day.   FLEXION: Sitting (Active)    Sit, both feet flat. Lift right knee toward ceiling. Tighten abdomin and buttock (draw in) Complete _10__ sets of _1-2__ repetitions. Perform _1-2__ sessions per day.   

## 2018-03-02 NOTE — Therapy (Signed)
[facility] [place], [place], [address] Phone: [phone]   Fax:  [phone]  Physical Therapy Treatment/Discharge  Patient Details  Name: Tanya Lucero MRN: [id number] Date of Birth: [birth year] Referring Provider: [name] DO   Encounter Date: 03/02/2018  PT End of Session - 03/02/18 0839    Visit Number  12    Number of Visits  12    Date for PT Re-Evaluation  03/14/18    PT Start Time  0820    PT Stop Time  0900    PT Time Calculation (min)  40 min    Activity Tolerance  Patient tolerated treatment well    Behavior During Therapy  [facility] for tasks assessed/performed       Past Medical History:  Diagnosis Date  . Anemia   . Anxiety   . Arthritis   . Chronic kidney disease    neuphrotic syndrome-05/2013  . Constipation   . Decreased vision    left eye  . Depression   . Diabetes mellitus    Type 2  . Diabetic neuropathy ([place])   . Diverticulitis   . Dry skin   . GERD (gastroesophageal reflux disease)   . GI bleed 07/05/2017  . Gout   . Headache   . Hypertension   . Neuropathy   . Shortness of breath dyspnea   . Skin cancer     Past Surgical History:  Procedure Laterality Date  . ABDOMINAL HYSTERECTOMY    . ADENOIDECTOMY    . APPENDECTOMY     taken out with hysterectomy  . AV FISTULA PLACEMENT Right 09/13/2014   Procedure: Creation of Right Arm RADIOCEPHALIC ARTERIOVENOUS (AV) FISTULA ;  Surgeon: [name], MD;  Location: [place];  Service: Vascular;  Laterality: Right;  . BACK SURGERY     lumbar -   . CATARACT EXTRACTION W/PHACO Left 07/02/2013   Procedure: CATARACT EXTRACTION PHACO AND INTRAOCULAR LENS PLACEMENT (IOC);  Surgeon: [name], MD;  [facility];  Service: Ophthalmology;  Laterality: Left;  CDE:  6.46  . CATARACT EXTRACTION W/PHACO Right 07/12/2013   Procedure: CATARACT EXTRACTION PHACO AND INTRAOCULAR LENS PLACEMENT (IOC);  Surgeon: [name], MD;  [facility]: [facility];   Service: Ophthalmology;  Laterality: Right;  CDE:12.48  . COLONOSCOPY N/A 12/11/2015   Procedure: COLONOSCOPY;  Surgeon: [name], MD;  [facility];  Service: Endoscopy;  Laterality: N/A;  1:55  . ESOPHAGOGASTRODUODENOSCOPY N/A 03/11/2016   Procedure: ESOPHAGOGASTRODUODENOSCOPY (EGD);  Surgeon: [name], MD;  [facility];  Service: Endoscopy;  Laterality: N/A;  1230 - Last pt of the day-Dr. Laural Golden going to office to see pt's  . ESOPHAGOGASTRODUODENOSCOPY N/A 07/21/2017   Procedure: ESOPHAGOGASTRODUODENOSCOPY (EGD);  Surgeon: [name], MD;  [facility];  Service: Endoscopy;  Laterality: N/A;  3:00  . FOOT SURGERY Right    x2-heel spur   . GIVENS CAPSULE STUDY N/A 11/09/2016   Procedure: GIVENS CAPSULE STUDY;  Surgeon: [name], MD;  [facility];  Service: Endoscopy;  Laterality: N/A;  7:30  . REVISON OF ARTERIOVENOUS FISTULA Right 01/05/2016   Procedure: REVISON OF ARTERIOVENOUS FISTULA- right forearm;  Surgeon: [name], MD;  Location: [place];  Service: Vascular;  Laterality: Right;  . SKIN CANCER EXCISION     forehead  . TONSILLECTOMY      There were no vitals filed for this visit.  Subjective Assessment - 03/02/18 7026  Subjective  Patient reported no new complaints.     Pertinent History  CKD stage 4; HTN, DM, lumbar disc 1991, depression, anxiety    Patient Stated Goals  to stop falling    Currently in Pain?  No/denies         [facility] PT Assessment - 03/02/18 0001      Berg Balance Test   Sit to Stand  Able to stand without using hands and stabilize independently    Standing Unsupported  Able to stand safely 2 minutes    Sitting with Back Unsupported but Feet Supported on Floor or Stool  Able to sit safely and securely 2 minutes    Stand to Sit  Sits safely with minimal use of hands    Transfers  Able to transfer safely, minor use of hands    Standing Unsupported with Eyes Closed  Able to stand 10 seconds  safely    Standing Ubsupported with Feet Together  Able to place feet together independently and stand for 1 minute with supervision    From Standing, Reach Forward with Outstretched Arm  Can reach confidently >25 cm (10")    From Standing Position, Pick up Object from Floor  Able to pick up shoe safely and easily    From Standing Position, Turn to Look Behind Over each Shoulder  Looks behind from both sides and weight shifts well    Turn 360 Degrees  Able to turn 360 degrees safely in 4 seconds or less    Standing Unsupported, Alternately Place Feet on Step/Stool  Able to complete 4 steps without aid or supervision    Standing Unsupported, One Foot in Front  Needs help to step but can hold 15 seconds    Standing on One Leg  Tries to lift leg/unable to hold 3 seconds but remains standing independently    Total Score  47                   [facility] Adult PT Treatment/Exercise - 03/02/18 0001      Knee/Hip Exercises: Aerobic   Nustep  Level 5 x15 mins UE/LE monitored      Knee/Hip Exercises: Seated   Long Arc Quad  Strengthening;Both;10 reps;Weights;2 sets    [employer] Weight  4 lbs.    Clamshell with TheraBand  Red x40    Marching  Strengthening;Both;10 reps;Weights;2 sets 4#          Balance Exercises - 03/02/18 0834      Balance Exercises: Standing   Standing Eyes Opened  Narrow base of support (BOS);Foam/compliant surface;2 reps;30 secs    Standing Eyes Closed  Narrow base of support (BOS);Foam/compliant surface;2 reps;30 secs    Tandem Stance  Eyes open;Upper extremity support 2;2 reps;30 secs    SLS  Limitations;2 reps    Step Ups  6 inch;Intermittent UE support;UE support 1;Forward toe taps    Sit to Stand Time  x10        PT Education - 03/02/18 0851    Education provided  Yes    Education Details  HEP posture/core activation strength to assist in balance and fall prevention    Person(s) Educated  Patient    Methods  Explanation;Demonstration;Handout     Comprehension  Verbalized understanding;Returned demonstration          PT Long Term Goals - 03/02/18 0826      PT LONG TERM GOAL #1   Title  Patient to improve BERG to 48/54 to  reduce fall risk.    Baseline  02-02-18 43/54    Time  6    Period  Weeks    Status  Not Met 47/56 03/02/18      PT LONG TERM GOAL #2   Title  Patient to report no falls.    Period  Weeks    Status  Achieved            Plan - 03/02/18 0840    Clinical Impression Statement  Patient tolerated treatment well today. Patient feels overall improvement and improved BERG score to 47/56. Patient has some occasions of LOB when in bathroom to sit on toilet. Patient was educated on fall prvention and HEP activities. Patient met 1 out of 2 goals.     Rehab Potential  Good    Clinical Impairments Affecting Rehab Potential  neuropathy, R wrist sprain, CKD stage 4    PT Frequency  2x / week    PT Duration  6 weeks    PT Treatment/Interventions  ADLs/Self Care Home Management;Gait training;Therapeutic exercise;Therapeutic activities;Neuromuscular re-education;Balance training;Patient/family education    PT Next Visit Plan  DC    Consulted and Agree with Plan of Care  Patient       Patient will benefit from skilled therapeutic intervention in order to improve the following deficits and impairments:  Decreased balance, Postural dysfunction  Visit Diagnosis: Unsteadiness on feet     Problem List Patient Active Problem List   Diagnosis Date Noted  . Guaiac positive stools 07/19/2017  . GI bleed 07/05/2017  . Iron deficiency anemia due to chronic blood loss 10/27/2016  . RENAL INSUFFICIENCY 03/04/2010  . DM 02/17/2010  . HYPERTENSION 02/17/2010  . OTHER MALAISE AND FATIGUE 02/17/2010  . SHORTNESS OF BREATH 02/17/2010  . CHEST PAIN, PRECORDIAL 02/17/2010    PHYSICAL THERAPY DISCHARGE SUMMARY  Visits from Start of Care: 12  Current functional level related to goals / functional outcomes: See above    Remaining deficits: See goals   Education / Equipment: HEP and theraband Plan: Patient agrees to discharge.  Patient goals were partially met. Patient is being discharged due to lack of progress.  ?????       Ladean Raya, PTA 03/02/18 9:00 AM  [facility] [address] [place], [place], [address] Phone: [phone]   Fax:  [phone]  Name: Tanya Lucero MRN: [id number] Date of Birth: 1951-03-06

## 2018-03-13 LAB — CBC
HCT: 24.4 % — ABNORMAL LOW (ref 35.0–45.0)
HEMOGLOBIN: 8 g/dL — AB (ref 11.7–15.5)
MCH: 31.6 pg (ref 27.0–33.0)
MCHC: 32.8 g/dL (ref 32.0–36.0)
MCV: 96.4 fL (ref 80.0–100.0)
MPV: 9.2 fL (ref 7.5–12.5)
Platelets: 157 10*3/uL (ref 140–400)
RBC: 2.53 10*6/uL — ABNORMAL LOW (ref 3.80–5.10)
RDW: 16.7 % — ABNORMAL HIGH (ref 11.0–15.0)
WBC: 5.2 10*3/uL (ref 3.8–10.8)

## 2018-03-20 ENCOUNTER — Other Ambulatory Visit (INDEPENDENT_AMBULATORY_CARE_PROVIDER_SITE_OTHER): Payer: Self-pay | Admitting: *Deleted

## 2018-03-20 ENCOUNTER — Encounter (INDEPENDENT_AMBULATORY_CARE_PROVIDER_SITE_OTHER): Payer: Self-pay | Admitting: *Deleted

## 2018-03-20 DIAGNOSIS — D649 Anemia, unspecified: Secondary | ICD-10-CM

## 2018-03-20 DIAGNOSIS — K922 Gastrointestinal hemorrhage, unspecified: Secondary | ICD-10-CM

## 2018-04-03 ENCOUNTER — Telehealth (INDEPENDENT_AMBULATORY_CARE_PROVIDER_SITE_OTHER): Payer: Self-pay | Admitting: Internal Medicine

## 2018-04-03 ENCOUNTER — Other Ambulatory Visit: Payer: Self-pay

## 2018-04-03 ENCOUNTER — Emergency Department (HOSPITAL_COMMUNITY)
Admission: EM | Admit: 2018-04-03 | Discharge: 2018-04-04 | Disposition: A | Discharge status: Home / Self Care | Payer: Medicare Other | Attending: Emergency Medicine | Admitting: Emergency Medicine

## 2018-04-03 ENCOUNTER — Other Ambulatory Visit (INDEPENDENT_AMBULATORY_CARE_PROVIDER_SITE_OTHER): Payer: Self-pay | Admitting: *Deleted

## 2018-04-03 ENCOUNTER — Encounter (HOSPITAL_COMMUNITY): Payer: Self-pay | Admitting: Emergency Medicine

## 2018-04-03 DIAGNOSIS — E1140 Type 2 diabetes mellitus with diabetic neuropathy, unspecified: Secondary | ICD-10-CM | POA: Diagnosis not present

## 2018-04-03 DIAGNOSIS — D649 Anemia, unspecified: Secondary | ICD-10-CM

## 2018-04-03 DIAGNOSIS — Z794 Long term (current) use of insulin: Secondary | ICD-10-CM | POA: Insufficient documentation

## 2018-04-03 DIAGNOSIS — I129 Hypertensive chronic kidney disease with stage 1 through stage 4 chronic kidney disease, or unspecified chronic kidney disease: Secondary | ICD-10-CM | POA: Insufficient documentation

## 2018-04-03 DIAGNOSIS — F17210 Nicotine dependence, cigarettes, uncomplicated: Secondary | ICD-10-CM | POA: Insufficient documentation

## 2018-04-03 DIAGNOSIS — E1122 Type 2 diabetes mellitus with diabetic chronic kidney disease: Secondary | ICD-10-CM | POA: Diagnosis not present

## 2018-04-03 DIAGNOSIS — N189 Chronic kidney disease, unspecified: Secondary | ICD-10-CM | POA: Diagnosis not present

## 2018-04-03 DIAGNOSIS — R531 Weakness: Secondary | ICD-10-CM | POA: Diagnosis present

## 2018-04-03 DIAGNOSIS — Z79899 Other long term (current) drug therapy: Secondary | ICD-10-CM | POA: Diagnosis not present

## 2018-04-03 LAB — CBC
HCT: 21.6 % — ABNORMAL LOW (ref 35.0–45.0)
Hemoglobin: 7.2 g/dL — ABNORMAL LOW (ref 11.7–15.5)
MCH: 32.3 pg (ref 27.0–33.0)
MCHC: 33.3 g/dL (ref 32.0–36.0)
MCV: 96.9 fL (ref 80.0–100.0)
MPV: 9.6 fL (ref 7.5–12.5)
Platelets: 170 10*3/uL (ref 140–400)
RBC: 2.23 10*6/uL — ABNORMAL LOW (ref 3.80–5.10)
RDW: 17 % — ABNORMAL HIGH (ref 11.0–15.0)
WBC: 5.7 10*3/uL (ref 3.8–10.8)

## 2018-04-03 LAB — CBC WITH DIFFERENTIAL/PLATELET
BASOS PCT: 0 %
Basophils Absolute: 0 10*3/uL (ref 0.0–0.1)
EOS ABS: 0.1 10*3/uL (ref 0.0–0.7)
EOS PCT: 2 %
HCT: 24.2 % — ABNORMAL LOW (ref 36.0–46.0)
Hemoglobin: 7.7 g/dL — ABNORMAL LOW (ref 12.0–15.0)
LYMPHS ABS: 1.3 10*3/uL (ref 0.7–4.0)
Lymphocytes Relative: 21 %
MCH: 32.2 pg (ref 26.0–34.0)
MCHC: 31.8 g/dL (ref 30.0–36.0)
MCV: 101.3 fL — ABNORMAL HIGH (ref 78.0–100.0)
MONOS PCT: 6 %
Monocytes Absolute: 0.4 10*3/uL (ref 0.1–1.0)
NEUTROS PCT: 71 %
Neutro Abs: 4.3 10*3/uL (ref 1.7–7.7)
PLATELETS: 169 10*3/uL (ref 150–400)
RBC: 2.39 MIL/uL — ABNORMAL LOW (ref 3.87–5.11)
RDW: 18 % — AB (ref 11.5–15.5)
WBC: 6 10*3/uL (ref 4.0–10.5)

## 2018-04-03 LAB — BASIC METABOLIC PANEL
Anion gap: 10 (ref 5–15)
BUN: 26 mg/dL — AB (ref 8–23)
CALCIUM: 8.6 mg/dL — AB (ref 8.9–10.3)
CHLORIDE: 100 mmol/L (ref 98–111)
CO2: 28 mmol/L (ref 22–32)
CREATININE: 4.68 mg/dL — AB (ref 0.44–1.00)
GFR calc Af Amer: 10 mL/min — ABNORMAL LOW (ref >60)
GFR, EST NON AFRICAN AMERICAN: 9 mL/min — AB (ref >60)
Glucose, Bld: 176 mg/dL — ABNORMAL HIGH (ref 70–99)
Potassium: 3.5 mmol/L (ref 3.5–5.1)
SODIUM: 138 mmol/L (ref 135–145)

## 2018-04-03 LAB — PROTIME-INR
INR: 0.99
PROTHROMBIN TIME: 13 s (ref 11.4–15.2)

## 2018-04-03 LAB — PREPARE RBC (CROSSMATCH)

## 2018-04-03 LAB — ABO/RH: ABO/RH(D): O POS

## 2018-04-03 LAB — POC OCCULT BLOOD, ED: Fecal Occult Bld: NEGATIVE

## 2018-04-03 NOTE — ED Notes (Signed)
Received report on pt, pt updated on plan of care, comfort measures provided,

## 2018-04-03 NOTE — Telephone Encounter (Signed)
Orders have been started.

## 2018-04-03 NOTE — Telephone Encounter (Signed)
Tammy, needs to be t and C for 2 units of PRBCs

## 2018-04-03 NOTE — ED Triage Notes (Signed)
Pt sent over by Dr. Daisey Must for hemoglobin of 7.2. States she had blood work drawn prior to dialysis. Completed dialysis with no issue. Pt hx of anemia. Endorses weakness, fatigue, and intermittent SOB. Pt pale upon assessment.

## 2018-04-03 NOTE — ED Notes (Signed)
Restricted extremity band placed on RT arm.

## 2018-04-03 NOTE — ED Provider Notes (Addendum)
[facility] EMERGENCY DEPARTMENT Provider Note   CSN: [id number] Arrival date & time: 04/03/18  1759     History   Chief Complaint Chief Complaint  Patient presents with  . Abnormal Lab    HPI Tanya Lucero is a 67 y.o. female.  HPI Patient presented to the emergency room for evaluation of abnormal lab tests.  According to the medical records.  The patient was sent over from Dr. Olevia Perches office.  She had outpatient laboratory test today that showed a hemoglobin of 7.2.  Patient was sent to the emergency room to receive 2 units of blood.  Patient noticed some dark stools over the weekend but has not noticed any blood in her stools or dark stools recently.  No vomiting.  She does feel generalized weakness and fatigue.  She is also had some intermittent shortness of breath.  Patient does have a history of chronic kidney disease and went to dialysis today.  She did not have any issues with that.  Patient denies any fevers or chills.  She denies any abdominal pain.  No other complaints. Past Medical History:  Diagnosis Date  . Anemia   . Anxiety   . Arthritis   . Chronic kidney disease    neuphrotic syndrome-05/2013  . Constipation   . Decreased vision    left eye  . Depression   . Diabetes mellitus    Type 2  . Diabetic neuropathy ([place])   . Diverticulitis   . Dry skin   . GERD (gastroesophageal reflux disease)   . GI bleed 07/05/2017  . Gout   . Headache   . Hypertension   . Neuropathy   . Shortness of breath dyspnea   . Skin cancer     Patient Active Problem List   Diagnosis Date Noted  . Guaiac positive stools 07/19/2017  . GI bleed 07/05/2017  . Iron deficiency anemia due to chronic blood loss 10/27/2016  . RENAL INSUFFICIENCY 03/04/2010  . DM 02/17/2010  . HYPERTENSION 02/17/2010  . OTHER MALAISE AND FATIGUE 02/17/2010  . SHORTNESS OF BREATH 02/17/2010  . CHEST PAIN, PRECORDIAL 02/17/2010    Past Surgical History:  Procedure Laterality Date  . ABDOMINAL  HYSTERECTOMY    . ADENOIDECTOMY    . APPENDECTOMY     taken out with hysterectomy  . AV FISTULA PLACEMENT Right 09/13/2014   Procedure: Creation of Right Arm RADIOCEPHALIC ARTERIOVENOUS (AV) FISTULA ;  Surgeon: [name], MD;  Location: [place];  Service: Vascular;  Laterality: Right;  . BACK SURGERY     lumbar -   . CATARACT EXTRACTION W/PHACO Left 07/02/2013   Procedure: CATARACT EXTRACTION PHACO AND INTRAOCULAR LENS PLACEMENT (IOC);  Surgeon: [name], MD;  [facility];  Service: Ophthalmology;  Laterality: Left;  CDE:  6.46  . CATARACT EXTRACTION W/PHACO Right 07/12/2013   Procedure: CATARACT EXTRACTION PHACO AND INTRAOCULAR LENS PLACEMENT (IOC);  Surgeon: [name], MD;  Location: [facility];  Service: Ophthalmology;  Laterality: Right;  CDE:12.48  . COLONOSCOPY N/A 12/11/2015   Procedure: COLONOSCOPY;  Surgeon: [name], MD;  [facility];  Service: Endoscopy;  Laterality: N/A;  1:55  . ESOPHAGOGASTRODUODENOSCOPY N/A 03/11/2016   Procedure: ESOPHAGOGASTRODUODENOSCOPY (EGD);  Surgeon: [name], MD;  [facility];  Service: Endoscopy;  Laterality: N/A;  1230 - Last pt of the day-Dr. Laural Golden going to office to see pt's  . ESOPHAGOGASTRODUODENOSCOPY N/A 07/21/2017   Procedure: ESOPHAGOGASTRODUODENOSCOPY (EGD);  Surgeon: [name],  MD;  [facility];  Service: Endoscopy;  Laterality: N/A;  3:00  . FOOT SURGERY Right    x2-heel spur   . GIVENS CAPSULE STUDY N/A 11/09/2016   Procedure: GIVENS CAPSULE STUDY;  Surgeon: [name], MD;  [facility];  Service: Endoscopy;  Laterality: N/A;  7:30  . REVISON OF ARTERIOVENOUS FISTULA Right 01/05/2016   Procedure: REVISON OF ARTERIOVENOUS FISTULA- right forearm;  Surgeon: [name], MD;  Location: [place];  Service: Vascular;  Laterality: Right;  . SKIN CANCER EXCISION     forehead  . TONSILLECTOMY       OB History   None      Home Medications    Prior to Admission  medications   Medication Sig Start Date End Date Taking? Authorizing Provider  acetaminophen (TYLENOL) 500 MG tablet Take 1,000 mg every 6 (six) hours as needed by mouth (FOR HEADACHES/MINOR DISCOMFORT).   Yes [provider]  ALPRAZolam (XANAX) 0.25 MG tablet Take 0.25-0.5 mg 2 (two) times daily by mouth. (0800 & 2000)Take 2 tablets (0.5 mg) in the morning & 1 tablet (0.25 mg) by mouth at bedtime   Yes [provider]  aluminum-magnesium hydroxide-simethicone (MAALOX) 469-629-52 MG/5ML SUSP Take 30 mLs by mouth 4 (four) times daily as needed (heartburn/indigestion).   Yes [provider]  amLODipine (NORVASC) 10 MG tablet Take 10 mg daily by mouth. (0800)   Yes [provider]  anti-nausea (EMETROL) solution Take 30 mLs every 15 (fifteen) minutes as needed by mouth for nausea or vomiting (X4 doses only).   Yes [provider]  b complex vitamins capsule Take 1 capsule daily by mouth. (0800)   Yes [provider]  buPROPion (WELLBUTRIN) 75 MG tablet Take 75 mg daily by mouth. (0800)   Yes [provider]  carvedilol (COREG) 6.25 MG tablet Take 6.25 mg 2 (two) times daily with a meal by mouth. (0800&2000)   Yes [provider]  cholecalciferol (VITAMIN D) 1000 units tablet Take 1,000 Units by mouth daily.   Yes [provider]  fexofenadine (ALLEGRA) 180 MG tablet Take 180 mg daily by mouth. (0800)   Yes [provider]  furosemide (LASIX) 40 MG tablet Take 40 mg daily by mouth. (0800)   Yes [provider]  gabapentin (NEURONTIN) 600 MG tablet Take 600 mg by mouth 3 (three) times daily. (0800, 1400, 2000)   Yes [provider]  glipiZIDE (GLUCOTROL) 5 MG tablet Take 5 mg 2 (two) times daily before a meal by mouth. (0800&2000)   Yes [provider]  guaifenesin (ROBITUSSIN) 100 MG/5ML syrup Take 200 mg every 6 (six) hours as needed by mouth for cough.   Yes [provider]    hydrALAZINE (APRESOLINE) 25 MG tablet Take 25 mg 3 (three) times daily by mouth.   Yes [provider]  insulin glargine (LANTUS) 100 unit/mL SOPN Inject 25-55 Units into the skin See admin instructions. 55units in the morning and 25 units in the evening   Yes [provider]  insulin lispro (HUMALOG) 100 UNIT/ML KiwkPen Inject 10 Units 3 (three) times daily before meals into the skin.   Yes [provider]  insulin regular (NOVOLIN R,HUMULIN R) 100 units/mL injection Inject 15 Units daily as needed into the skin for high blood sugar (SUGARS OVER 400).   Yes [provider]  loperamide (IMODIUM A-D) 2 MG tablet Take 2 mg 4 (four) times daily as needed by mouth  for diarrhea or loose stools (up to 8 doses in 24 hrs.).   Yes [provider]  Melatonin 5 MG CAPS Take 10 mg by mouth at bedtime.    Yes [provider]  pantoprazole (PROTONIX) 40 MG tablet Take 40 mg daily at 6 (six) AM by mouth.    Yes [provider]  polyvinyl alcohol (LIQUIFILM TEARS) 1.4 % ophthalmic solution Place 1 drop 5 (five) times daily as needed into both eyes for dry eyes.   Yes [provider]  sertraline (ZOLOFT) 50 MG tablet Take 75 mg by mouth daily.    Yes [provider]  sevelamer carbonate (RENVELA) 800 MG tablet Take 1,600-2,400 mg by mouth See admin instructions. 3 tablets with meals and 2 tablets with snacks   Yes [provider]    Family History Family History  Problem Relation Age of Onset  . Multiple myeloma Mother   . Diabetes Mother   . Heart attack Father 57    Social History Social History   Tobacco Use  . Smoking status: Current Every Day Smoker    Packs/day: 0.25    Years: 40.00    Pack years: 10.00    Types: Cigarettes  . Smokeless tobacco: Never Used  Substance Use Topics  . Alcohol use: No    Alcohol/week: 0.0 oz  . Drug use: No     Allergies   Penicillins   Review of Systems Review of  Systems  All other systems reviewed and are negative.    Physical Exam Updated Vital Signs BP (!) 145/61   Pulse 83   Temp 98.8 F (37.1 C) (Oral)   Resp 16   Ht 1.626 m (_0 )   Wt 98.4 kg (217 lb)   SpO2 94%   BMI 37.25 kg/m   Physical Exam  Constitutional: No distress.  HENT:  Head: Normocephalic and atraumatic.  Right Ear: External ear normal.  Left Ear: External ear normal.  Eyes: Conjunctivae are normal. Right eye exhibits no discharge. Left eye exhibits no discharge. No scleral icterus.  Neck: Neck supple. No tracheal deviation present.  Cardiovascular: Normal rate, regular rhythm and intact distal pulses.  Pulmonary/Chest: Effort normal and breath sounds normal. No stridor. No respiratory distress. She has no wheezes. She has no rales.  Abdominal: Soft. Bowel sounds are normal. She exhibits no distension. There is no tenderness. There is no rebound and no guarding.  Musculoskeletal: She exhibits no edema or tenderness.  Neurological: She is alert. She has normal strength. No cranial nerve deficit (no facial droop, extraocular movements intact, no slurred speech) or sensory deficit. She exhibits normal muscle tone. She displays no seizure activity. Coordination normal.  Skin: Skin is warm and dry. No rash noted. She is not diaphoretic. There is pallor.  Psychiatric: She has a normal mood and affect.  Nursing note and vitals reviewed.    ED Treatments / Results  Labs (all labs ordered are listed, but only abnormal results are displayed) Labs Reviewed  CBC WITH DIFFERENTIAL/PLATELET - Abnormal; Notable for the following components:      Result Value   RBC 2.39 (*)    Hemoglobin 7.7 (*)    HCT 24.2 (*)    MCV 101.3 (*)    RDW 18.0 (*)    All other components within normal limits  BASIC METABOLIC PANEL - Abnormal; Notable for the following components:   Glucose, Bld 176 (*)    BUN 26 (*)    Creatinine, Ser 4.68 (*)  Calcium 8.6 (*)    GFR calc non Af Amer 9  (*)    GFR calc Af Amer 10 (*)    All other components within normal limits  PROTIME-INR  POC OCCULT BLOOD, ED  TYPE AND SCREEN  PREPARE RBC (CROSSMATCH)  ABO/RH    EKG EKG Interpretation  Date/Time:  Monday April 03 2018 18:07:34 EDT Ventricular Rate:  82 PR Interval:    QRS Duration: 106 QT Interval:  403 QTC Calculation: 471 R Axis:   21 Text Interpretation:  Sinus rhythm Inferior infarct, old Lateral leads are also involved No significant change since last tracing Confirmed by Dorie Rank [phone]) on 04/03/2018 6:27:40 PM   Radiology No results found.  Procedures .Critical Care Performed by: Dorie Rank, MD Authorized by: Dorie Rank, MD   Critical care provider statement:    Critical care time (minutes):  45   Critical care was time spent personally by me on the following activities:  Discussions with consultants, evaluation of patient's response to treatment, examination of patient, ordering and performing treatments and interventions, ordering and review of laboratory studies, ordering and review of radiographic studies, pulse oximetry, re-evaluation of patient's condition, obtaining history from patient or surrogate and review of old charts   (including critical care time)  Medications Ordered in ED Medications - No data to display   Initial Impression / Assessment and Plan / ED Course  I have reviewed the triage vital signs and the nursing notes.  Pertinent labs & imaging results that were available during my care of the patient were reviewed by me and considered in my medical decision making (see chart for details).  Clinical Course as of Apr 03 2344  Mon Apr 03, 2018  1926 Hgb is 7.7  Improved from earlier today.   [JK]  1928 2 mo ago, hgb 8.9, 9 mo ago, hgb was 8.2   [JK]  1935 Old records reviewed.  Pt does have hx of iron deficiency anemia.  Pt has had endoscopy and colonoscopy before.  No active bleeding noted   [JK]  1940 D/w Dr Oneida Alar.  Will give one  unit of blood here in the ED tonight.  Pt can follow up with Dr Laural Golden, if he feels additional unit is needed could likely have it done during dialysis   [JK]  2344 Blood transfusion ordered however pt has antibodies.  It is taking time to find the appropriate blood.   [JK]    Clinical Course User Index [JK] Dorie Rank, MD    Pt presented to the ED for evaluation of anemia. Hemoccult negative.  Anemia may be multifactorial including her chronic kidney disease.  Plan is to transfuse one unit of blood.  Pt will follow up with her GI doctor.  Care turned over to oncoming MD as pt has not received her blood yet.  Final Clinical Impressions(s) / ED Diagnoses   Final diagnoses:  Symptomatic anemia     Dorie Rank, MD 04/03/18 [place]   Dorie Rank, MD 04/11/18 [phone]

## 2018-04-03 NOTE — ED Notes (Signed)
Pt given graham crackers, peanut butter and diet coke and pt informed of delay in blood;

## 2018-04-03 NOTE — ED Notes (Signed)
EDP at bedside  

## 2018-04-04 DIAGNOSIS — D649 Anemia, unspecified: Secondary | ICD-10-CM | POA: Diagnosis not present

## 2018-04-04 LAB — HEMOGLOBIN AND HEMATOCRIT, BLOOD
HEMATOCRIT: 27.2 % — AB (ref 36.0–46.0)
HEMOGLOBIN: 8.9 g/dL — AB (ref 12.0–15.0)

## 2018-04-04 LAB — CBG MONITORING, ED: Glucose-Capillary: 108 mg/dL — ABNORMAL HIGH (ref 70–99)

## 2018-04-04 MED ORDER — ACETAMINOPHEN 325 MG PO TABS
650.0000 mg | ORAL_TABLET | Freq: Once | ORAL | Status: AC
  Administered 2018-04-04: 650 mg via ORAL
  Filled 2018-04-04: qty 2

## 2018-04-04 NOTE — ED Notes (Signed)
Family will take pt back to [facility], still has many concerns. States they were here all night and orders was not performed. Sister will follow up with GI today.

## 2018-04-04 NOTE — ED Notes (Signed)
Spoke with Lauren tech in lab who advised that pt would need to be restuck for blood bank tube and then it would be sent to [facility] due to antibioties, Dr Tomi Bamberger, pt and family updated,  Pt placed in recliner per request, comfort measures provided,

## 2018-04-04 NOTE — ED Notes (Signed)
Pt resting in recliner, update given, no distress noted,

## 2018-04-04 NOTE — ED Notes (Signed)
Pt ambulatory to the restroom.  

## 2018-04-04 NOTE — ED Notes (Signed)
CONTACT INFORMATION RAMONA Earlie Server (SISTER/POA) [phone]

## 2018-04-04 NOTE — ED Notes (Signed)
Family at bedside. 

## 2018-04-04 NOTE — ED Provider Notes (Signed)
After completion of transfusion patient had repeat hemoglobin test performed, the value was 8.9, as high as her value has been in 2 months. I discussed this with family members, the patient, encouraged her to follow-up with primary care and GI for further evaluation of her persistent anemia.  We discussed her unsatisfying tests thus far, including capsule endoscopy, EGD, need for ongoing evaluation. Absent evidence for distress, after successful transfusion, patient discharged in stable condition.   Carmin Muskrat, MD 04/04/18 1059

## 2018-04-04 NOTE — ED Notes (Signed)
Patient given discharge instruction, verbalized understand. IV removed, band aid applied. Patient assisted in wheelchair out of the department.

## 2018-04-04 NOTE — Discharge Instructions (Addendum)
As discussed, it is important he follow-up with your gastroenterologist and your primary care physician for evaluation of your ongoing symptomatic anemia.  Return here for concerning changes in your condition.

## 2018-04-05 LAB — TYPE AND SCREEN
ABO/RH(D): O POS
ANTIBODY SCREEN: POSITIVE
Donor AG Type: NEGATIVE
PT AG Type: NEGATIVE
UNIT DIVISION: 0
Unit division: 0

## 2018-04-05 LAB — BPAM RBC
BLOOD PRODUCT EXPIRATION DATE: 201909012359
Blood Product Expiration Date: 201909012359
ISSUE DATE / TIME: 201907300546
ISSUE DATE / TIME: 201907300615
UNIT TYPE AND RH: 5100
Unit Type and Rh: 5100

## 2018-04-12 ENCOUNTER — Ambulatory Visit (INDEPENDENT_AMBULATORY_CARE_PROVIDER_SITE_OTHER): Payer: Medicare Other | Admitting: Internal Medicine

## 2018-05-02 ENCOUNTER — Ambulatory Visit (INDEPENDENT_AMBULATORY_CARE_PROVIDER_SITE_OTHER): Payer: Medicare Other | Admitting: Internal Medicine

## 2018-05-02 ENCOUNTER — Encounter (INDEPENDENT_AMBULATORY_CARE_PROVIDER_SITE_OTHER): Payer: Self-pay | Admitting: Internal Medicine

## 2018-05-02 VITALS — BP 150/78 | HR 80 | Temp 98.0°F | Ht 64.0 in | Wt 218.0 lb

## 2018-05-02 DIAGNOSIS — K921 Melena: Secondary | ICD-10-CM

## 2018-05-02 LAB — HEMOGLOBIN AND HEMATOCRIT, BLOOD
HEMATOCRIT: 24.2 % — AB (ref 35.0–45.0)
HEMOGLOBIN: 8.2 g/dL — AB (ref 11.7–15.5)

## 2018-05-02 NOTE — Progress Notes (Signed)
Subjective:    Patient ID: Tanya Lucero, female    DOB: 1951/02/10, 67 y.o.   MRN: [id number] PCP Dr. Melina Copa. Resident of [facility].  HPI Here today for f/u. Last seen In April of this year by me.  Hx of IDA and chronic GI  Bleeding.  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  Esophageal polyp found. Portal hypertensive gastropathy.  Two gastric polyps.  Normal cardia,gastric body and pylorus. Normal duodenal bulb and second portion of the duodenum.  Recently received 1 units of PRBC end of July.  She tells me she is doing okay. She feels draggy. Her appetite is okay. She has gained 2 pounds since her last visit. She has BM x 2-3 daily.  She says her stools are brown.  She has not seen any melena or BRRB.  04/04/2018 H and H 8.9. 04/03/2009 H and H 7.2   Diabetic at least 10 yrs or more. She takes Dialysis M-W-F.  On dialysis x 1 year.   11/09/2016 Given's capsule. Anemia and heme positive stool. Findings:  Patient was able to swallow given without any difficulty. Study duration 7 hours 54 minutes and 8 seconds. Gastric mucosa with erythema edema and to ileus covered with specks of blood or coffee-ground. No active bleeding noted. Diffuse pigmentation noted to do dental mucosa felt to be due to hemosiderin. This finding was also confirmed on EGD with biopsy in July 2017. No other small bowel lesions noted.     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 Heme positive stool.  Impression: - Normal esophagus. - Z-line regular, 40 cm from the incisors. - Nodular mucosa in the gastric antrum. 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 Biopsied. - Normal duodenal bulb. - Mucosal pigmentation in the duodenum. Biopsied.  12/11/2015 Colonoscopy: Guaiac  positive stool:  Impression: - Two 4 to 5 mm polyps at the recto-sigmoid colon  and in the cecum, removed with a cold snare.  Resected and retrieved. - One 12 mm polyp at the hepatic flexure, removed  with a hot snare. Resected and retrieved. - One 8 mm polyp in the transverse colon, removed  with a hot snare. Resected and retrieved. - Diverticulosis in the sigmoid colon. - External hemorrhoids.  He had 4 polyps removed. 2 are tubular adenomas.     Review of Systems   Past Medical History:  Diagnosis Date  . Anemia   . Anxiety   . Arthritis   . Chronic kidney disease    neuphrotic syndrome-05/2013  . Constipation   . Decreased vision    left eye  . Depression   . Diabetes mellitus    Type 2  . Diabetic neuropathy ([place])   . Diverticulitis   . Dry skin   . GERD (gastroesophageal reflux disease)   . GI bleed 07/05/2017  . Gout   . Headache   . Hypertension   . Neuropathy   . Shortness of breath dyspnea   . Skin cancer     Past Surgical History:  Procedure Laterality Date  . ABDOMINAL HYSTERECTOMY    . ADENOIDECTOMY    . APPENDECTOMY     taken out with hysterectomy  . AV FISTULA PLACEMENT Right 09/13/2014   Procedure: Creation of Right Arm RADIOCEPHALIC ARTERIOVENOUS (AV) FISTULA ;  Surgeon: [name], MD;  Location: [facility];  Service: Vascular;  Laterality: Right;  . BACK SURGERY  lumbar -   . CATARACT EXTRACTION W/PHACO Left 07/02/2013   Procedure: CATARACT EXTRACTION PHACO AND INTRAOCULAR LENS PLACEMENT (IOC);  Surgeon: [name], MD;  [facility]: [facility];  Service: Ophthalmology;  Laterality: Left;  CDE:  6.46  . CATARACT EXTRACTION W/PHACO Right 07/12/2013   Procedure: CATARACT EXTRACTION PHACO AND INTRAOCULAR LENS PLACEMENT (IOC);  Surgeon:  [name], MD;  Location: [facility];  Service: Ophthalmology;  Laterality: Right;  CDE:12.48  . COLONOSCOPY N/A 12/11/2015   Procedure: COLONOSCOPY;  Surgeon: [name], MD;  [facility];  Service: Endoscopy;  Laterality: N/A;  1:55  . ESOPHAGOGASTRODUODENOSCOPY N/A 03/11/2016   Procedure: ESOPHAGOGASTRODUODENOSCOPY (EGD);  Surgeon: [name], MD;  [facility];  Service: Endoscopy;  Laterality: N/A;  1230 - Last pt of the day-Dr. Laural Golden going to office to see pt's  . ESOPHAGOGASTRODUODENOSCOPY N/A 07/21/2017   Procedure: ESOPHAGOGASTRODUODENOSCOPY (EGD);  Surgeon: [name], MD;  [facility];  Service: Endoscopy;  Laterality: N/A;  3:00  . FOOT SURGERY Right    x2-heel spur   . GIVENS CAPSULE STUDY N/A 11/09/2016   Procedure: GIVENS CAPSULE STUDY;  Surgeon: [name], MD;  [facility];  Service: Endoscopy;  Laterality: N/A;  7:30  . REVISON OF ARTERIOVENOUS FISTULA Right 01/05/2016   Procedure: REVISON OF ARTERIOVENOUS FISTULA- right forearm;  Surgeon: [name], MD;  Location: [facility];  Service: Vascular;  Laterality: Right;  . SKIN CANCER EXCISION     forehead  . TONSILLECTOMY      Allergies  Allergen Reactions  . Penicillins Rash    Has patient had a PCN reaction causing immediate rash, facial/tongue/throat swelling, SOB or lightheadedness with hypotension: Unknown Has patient had a PCN reaction causing severe rash involving mucus membranes or skin necrosis: Unknown Has patient had a PCN reaction that required hospitalization: Unknown Has patient had a PCN reaction occurring within the last 10 years: Unknown If all of the above answers are "NO", then may proceed with Cephalosporin use.     Current Outpatient Medications on File Prior to Visit  Medication Sig Dispense Refill  . acetaminophen (TYLENOL) 500 MG tablet Take 1,000 mg every 6 (six) hours as needed by mouth (FOR HEADACHES/MINOR DISCOMFORT).    Marland Kitchen ALPRAZolam  (XANAX) 0.25 MG tablet Take 0.25-0.5 mg 2 (two) times daily by mouth. (0800 & 2000)Take 2 tablets (0.5 mg) in the morning & 1 tablet (0.25 mg) by mouth at bedtime    . aluminum-magnesium hydroxide-simethicone (MAALOX) 938-101-75 MG/5ML SUSP Take 30 mLs by mouth 4 (four) times daily as needed (heartburn/indigestion).    Marland Kitchen amLODipine (NORVASC) 10 MG tablet Take 10 mg daily by mouth. (0800)    . anti-nausea (EMETROL) solution Take 30 mLs every 15 (fifteen) minutes as needed by mouth for nausea or vomiting (X4 doses only).    Marland Kitchen b complex vitamins capsule Take 1 capsule daily by mouth. (0800)    . buPROPion (WELLBUTRIN) 75 MG tablet Take 75 mg daily by mouth. (0800)    . carvedilol (COREG) 6.25 MG tablet Take 6.25 mg 2 (two) times daily with a meal by mouth. (0800&2000)    . cholecalciferol (VITAMIN D) 1000 units tablet Take 1,000 Units by mouth daily.    . fexofenadine (ALLEGRA) 180 MG tablet Take 180 mg daily by mouth. (0800)    . furosemide (LASIX) 40 MG tablet Take 40 mg daily by mouth. (0800)    . gabapentin (NEURONTIN) 600 MG tablet Take  600 mg by mouth 3 (three) times daily. (0800, 1400, 2000)    . glipiZIDE (GLUCOTROL) 5 MG tablet Take 5 mg 2 (two) times daily before a meal by mouth. (0800&2000)    . guaifenesin (ROBITUSSIN) 100 MG/5ML syrup Take 200 mg every 6 (six) hours as needed by mouth for cough.    . hydrALAZINE (APRESOLINE) 25 MG tablet Take 25 mg 3 (three) times daily by mouth.    . insulin glargine (LANTUS) 100 unit/mL SOPN Inject 25-55 Units into the skin See admin instructions. 55units in the morning and 25 units in the evening    . insulin lispro (HUMALOG) 100 UNIT/ML KiwkPen Inject 10 Units 3 (three) times daily before meals into the skin.    Marland Kitchen insulin regular (NOVOLIN R,HUMULIN R) 100 units/mL injection Inject 15 Units daily as needed into the skin for high blood sugar (SUGARS OVER 400).    Marland Kitchen loperamide (IMODIUM A-D) 2 MG tablet Take 2 mg 4 (four) times daily as needed by mouth for  diarrhea or loose stools (up to 8 doses in 24 hrs.).    Marland Kitchen Melatonin 5 MG CAPS Take 10 mg by mouth at bedtime.     . pantoprazole (PROTONIX) 40 MG tablet Take 40 mg daily at 6 (six) AM by mouth.     . polyvinyl alcohol (LIQUIFILM TEARS) 1.4 % ophthalmic solution Place 1 drop 5 (five) times daily as needed into both eyes for dry eyes.    Marland Kitchen sertraline (ZOLOFT) 50 MG tablet Take 75 mg by mouth daily.     . sevelamer carbonate (RENVELA) 800 MG tablet Take 1,600-2,400 mg by mouth See admin instructions. 3 tablets with meals and 2 tablets with snacks     No current facility-administered medications on file prior to visit.         Objective:   Physical Exam  Blood pressure (!) 150/78, pulse 80, temperature 98 F (36.7 C), height 5\' 4"  (1.626 m), weight 218 lb (98.9 kg). Alert and oriented. Skin warm and dry. Oral mucosa is moist.   . Sclera anicteric, conjunctivae is pink. Thyroid not enlarged. No cervical lymphadenopathy. Lungs clear. Heart regular rate and rhythm.  Abdomen is soft. Bowel sounds are positive. No hepatomegaly. No abdominal masses felt. No tenderness.  No edema to lower extremities.          Assessment & Plan:  GI bleed. Am going to get an H and H today. Will see back in 6 months.

## 2018-05-02 NOTE — Patient Instructions (Signed)
H and H today. OV in 6 months

## 2018-05-03 ENCOUNTER — Other Ambulatory Visit (INDEPENDENT_AMBULATORY_CARE_PROVIDER_SITE_OTHER): Payer: Self-pay | Admitting: *Deleted

## 2018-05-03 DIAGNOSIS — K921 Melena: Secondary | ICD-10-CM

## 2018-05-29 LAB — HEMOGLOBIN AND HEMATOCRIT, BLOOD
HEMATOCRIT: 22.6 % — AB (ref 35.0–45.0)
Hemoglobin: 7.5 g/dL — ABNORMAL LOW (ref 11.7–15.5)

## 2018-05-30 ENCOUNTER — Telehealth (INDEPENDENT_AMBULATORY_CARE_PROVIDER_SITE_OTHER): Payer: Self-pay | Admitting: Internal Medicine

## 2018-05-30 ENCOUNTER — Encounter (HOSPITAL_COMMUNITY)
Admission: RE | Admit: 2018-05-30 | Discharge: 2018-05-30 | Disposition: A | Discharge status: Home / Self Care | Payer: Medicare Other | Source: Ambulatory Visit | Attending: Internal Medicine | Admitting: Internal Medicine

## 2018-05-30 DIAGNOSIS — D500 Iron deficiency anemia secondary to blood loss (chronic): Secondary | ICD-10-CM | POA: Insufficient documentation

## 2018-05-30 LAB — CBC
HCT: 25.3 % — ABNORMAL LOW (ref 36.0–46.0)
Hemoglobin: 8.3 g/dL — ABNORMAL LOW (ref 12.0–15.0)
MCH: 34.4 pg — ABNORMAL HIGH (ref 26.0–34.0)
MCHC: 32.8 g/dL (ref 30.0–36.0)
MCV: 105 fL — ABNORMAL HIGH (ref 78.0–100.0)
Platelets: 170 10*3/uL (ref 150–400)
RBC: 2.41 MIL/uL — ABNORMAL LOW (ref 3.87–5.11)
RDW: 18.4 % — ABNORMAL HIGH (ref 11.5–15.5)
WBC: 6.3 10*3/uL (ref 4.0–10.5)

## 2018-05-30 NOTE — Telephone Encounter (Signed)
Please call Gabriel Cirri at [facility] at [phone]

## 2018-05-30 NOTE — Telephone Encounter (Signed)
Patient is at [facility]

## 2018-05-30 NOTE — Telephone Encounter (Signed)
Ann, please send a copy of her lab to [facility].

## 2018-05-30 NOTE — Telephone Encounter (Signed)
I talked with Lana Fish RN and told her patient she was coming to the ED.

## 2018-05-30 NOTE — Telephone Encounter (Signed)
Lab report faxed to [facility]

## 2018-05-30 NOTE — Progress Notes (Signed)
Patient presented from [facility], stating that she received a telephone call from Dr Olevia Perches office advising her to come to the hospital for a blood transfusion.  States she she was told to come to short stay.  No orders in computer for T&C, nor for a transfusion.  Call placed to Dr Laural Golden, who stated that he had information that her hgb was low, however no knowledge of a plan. Telephone order taken to run stat CBC.  HGB 8.3.  Dr Laural Golden made aware and advised me to send patient home and transfusion was not needed.

## 2018-05-31 ENCOUNTER — Telehealth (INDEPENDENT_AMBULATORY_CARE_PROVIDER_SITE_OTHER): Payer: Self-pay | Admitting: *Deleted

## 2018-05-31 ENCOUNTER — Other Ambulatory Visit (INDEPENDENT_AMBULATORY_CARE_PROVIDER_SITE_OTHER): Payer: Self-pay | Admitting: *Deleted

## 2018-05-31 DIAGNOSIS — K922 Gastrointestinal hemorrhage, unspecified: Secondary | ICD-10-CM

## 2018-05-31 LAB — POCT HEMOGLOBIN-HEMACUE: Hemoglobin: 7.8 g/dL — ABNORMAL LOW (ref 12.0–15.0)

## 2018-05-31 NOTE — Telephone Encounter (Signed)
[facility] in [place] was called and made aware that the patient will need to have lab work on 1010/12019 . I talked with Jenny Reichmann and explained that we were going to have her go to [facility] lab to have this lab work completed. She states that she will take her early that morning and that she would let  Her sister, Gae Bon know.  I also am calling her sister.

## 2018-05-31 NOTE — Progress Notes (Signed)
hemo 

## 2018-06-15 ENCOUNTER — Other Ambulatory Visit (HOSPITAL_COMMUNITY)
Admission: RE | Admit: 2018-06-15 | Discharge: 2018-06-15 | Disposition: A | Discharge status: Home / Self Care | Payer: Medicare Other | Source: Ambulatory Visit | Attending: Internal Medicine | Admitting: Internal Medicine

## 2018-06-15 ENCOUNTER — Other Ambulatory Visit (INDEPENDENT_AMBULATORY_CARE_PROVIDER_SITE_OTHER): Payer: Self-pay | Admitting: *Deleted

## 2018-06-15 DIAGNOSIS — K922 Gastrointestinal hemorrhage, unspecified: Secondary | ICD-10-CM | POA: Diagnosis present

## 2018-06-15 LAB — HEMOGLOBIN AND HEMATOCRIT, BLOOD
HCT: 24.1 % — ABNORMAL LOW (ref 36.0–46.0)
Hemoglobin: 7.4 g/dL — ABNORMAL LOW (ref 12.0–15.0)

## 2018-06-16 ENCOUNTER — Telehealth (INDEPENDENT_AMBULATORY_CARE_PROVIDER_SITE_OTHER): Payer: Self-pay | Admitting: *Deleted

## 2018-06-16 NOTE — Telephone Encounter (Signed)
Orders were sent to Short Stay @ [facility]. Schedule is full for today and Monday , currently. If, after Monday the patient will need to have another Type and Screen as the one completed on 06/15/18 , the time for transfusion would have expired.  Short Stay will contact [facility] for a date and time for the type and screen and or the transfusions.  I have called [facility] , advised them if the patient becomes short of breathe , dizzy to bring her to the ED for further evaluation.

## 2018-06-19 ENCOUNTER — Encounter (HOSPITAL_COMMUNITY)
Admission: RE | Admit: 2018-06-19 | Discharge: 2018-06-19 | Disposition: A | Discharge status: Home / Self Care | Payer: Medicare Other | Source: Ambulatory Visit | Attending: Internal Medicine | Admitting: Internal Medicine

## 2018-06-19 DIAGNOSIS — D500 Iron deficiency anemia secondary to blood loss (chronic): Secondary | ICD-10-CM | POA: Insufficient documentation

## 2018-06-19 LAB — PREPARE RBC (CROSSMATCH)

## 2018-06-19 LAB — TYPE AND SCREEN
ABO/RH(D): O POS
ANTIBODY SCREEN: POSITIVE
DONOR AG TYPE: NEGATIVE
DONOR AG TYPE: NEGATIVE
Unit division: 0
Unit division: 0

## 2018-06-19 LAB — BPAM RBC
Blood Product Expiration Date: 201911112359
ISSUE DATE / TIME: 201910102055
UNIT TYPE AND RH: 5100

## 2018-06-20 ENCOUNTER — Encounter (HOSPITAL_COMMUNITY): Payer: Self-pay

## 2018-06-20 ENCOUNTER — Encounter (HOSPITAL_COMMUNITY)
Admission: RE | Admit: 2018-06-20 | Discharge: 2018-06-20 | Disposition: A | Discharge status: Home / Self Care | Payer: Medicare Other | Source: Ambulatory Visit | Attending: Internal Medicine | Admitting: Internal Medicine

## 2018-06-20 DIAGNOSIS — D500 Iron deficiency anemia secondary to blood loss (chronic): Secondary | ICD-10-CM | POA: Diagnosis not present

## 2018-06-20 MED ORDER — FUROSEMIDE 10 MG/ML IJ SOLN
20.0000 mg | Freq: Once | INTRAMUSCULAR | Status: AC
  Administered 2018-06-20: 20 mg via INTRAVENOUS
  Filled 2018-06-20: qty 4

## 2018-06-20 MED ORDER — SODIUM CHLORIDE 0.9% IV SOLUTION
Freq: Once | INTRAVENOUS | Status: AC
  Administered 2018-06-20: 09:00:00 via INTRAVENOUS

## 2018-06-20 MED ORDER — DIPHENHYDRAMINE HCL 25 MG PO CAPS
25.0000 mg | ORAL_CAPSULE | Freq: Once | ORAL | Status: AC
  Administered 2018-06-20: 25 mg via ORAL
  Filled 2018-06-20: qty 1

## 2018-06-20 MED ORDER — ACETAMINOPHEN 325 MG PO TABS
650.0000 mg | ORAL_TABLET | Freq: Once | ORAL | Status: AC
  Administered 2018-06-20: 650 mg via ORAL
  Filled 2018-06-20: qty 2

## 2018-06-20 NOTE — Progress Notes (Signed)
EMERGENCY BLOOD PRODUCT NOTE  Compare the patient ID on the blood tag to the patient ID on the hospital armband and Blood Bank armband. Then confirm the unit number on the blood tag matches the unit number on the blood product.  If a discrepancy is discovered return the product to blood bank immediately.   Blood Product Type:Packed Red Blood Cells   Unit #:  [id number]  Product Code #: (Found on blood product bag, begins with E) [id number]  Start Time: 0845  Starting Rate: 120 ml/hr  Rate increase/decreased : 200     ml/hr  Rate changed time 0900   Stop CHEK:3524 All Other Documentation should be documented within the Blood Admin Flowsheet per policy.  Hillery Jacks 8:53 AM

## 2018-06-21 ENCOUNTER — Other Ambulatory Visit (INDEPENDENT_AMBULATORY_CARE_PROVIDER_SITE_OTHER): Payer: Self-pay | Admitting: *Deleted

## 2018-06-21 ENCOUNTER — Encounter (INDEPENDENT_AMBULATORY_CARE_PROVIDER_SITE_OTHER): Payer: Self-pay | Admitting: Internal Medicine

## 2018-06-21 ENCOUNTER — Encounter (INDEPENDENT_AMBULATORY_CARE_PROVIDER_SITE_OTHER): Payer: Self-pay | Admitting: *Deleted

## 2018-06-21 DIAGNOSIS — K922 Gastrointestinal hemorrhage, unspecified: Secondary | ICD-10-CM

## 2018-06-21 DIAGNOSIS — D509 Iron deficiency anemia, unspecified: Secondary | ICD-10-CM

## 2018-06-21 DIAGNOSIS — D500 Iron deficiency anemia secondary to blood loss (chronic): Secondary | ICD-10-CM

## 2018-06-21 LAB — TYPE AND SCREEN
ABO/RH(D): O POS
ANTIBODY SCREEN: POSITIVE
DONOR AG TYPE: NEGATIVE
Donor AG Type: NEGATIVE
Unit division: 0
Unit division: 0

## 2018-06-21 LAB — BPAM RBC
BLOOD PRODUCT EXPIRATION DATE: 201911142359
Blood Product Expiration Date: 201911142359
ISSUE DATE / TIME: 201910151041
ISSUE DATE / TIME: 201910151041
UNIT TYPE AND RH: 5100
Unit Type and Rh: 5100

## 2018-07-06 ENCOUNTER — Ambulatory Visit (INDEPENDENT_AMBULATORY_CARE_PROVIDER_SITE_OTHER): Payer: Medicare Other | Admitting: Internal Medicine

## 2018-11-01 ENCOUNTER — Encounter (HOSPITAL_COMMUNITY): Payer: Self-pay

## 2018-11-01 ENCOUNTER — Other Ambulatory Visit: Payer: Self-pay

## 2018-11-01 ENCOUNTER — Emergency Department (HOSPITAL_COMMUNITY)
Admission: EM | Admit: 2018-11-01 | Discharge: 2018-11-02 | Disposition: A | Discharge status: Home / Self Care | Payer: Medicare Other | Attending: Emergency Medicine | Admitting: Emergency Medicine

## 2018-11-01 DIAGNOSIS — F17210 Nicotine dependence, cigarettes, uncomplicated: Secondary | ICD-10-CM | POA: Diagnosis not present

## 2018-11-01 DIAGNOSIS — D649 Anemia, unspecified: Secondary | ICD-10-CM | POA: Diagnosis not present

## 2018-11-01 DIAGNOSIS — E1122 Type 2 diabetes mellitus with diabetic chronic kidney disease: Secondary | ICD-10-CM | POA: Diagnosis not present

## 2018-11-01 DIAGNOSIS — N186 End stage renal disease: Secondary | ICD-10-CM | POA: Diagnosis not present

## 2018-11-01 DIAGNOSIS — Z79899 Other long term (current) drug therapy: Secondary | ICD-10-CM | POA: Insufficient documentation

## 2018-11-01 DIAGNOSIS — Z992 Dependence on renal dialysis: Secondary | ICD-10-CM | POA: Diagnosis not present

## 2018-11-01 DIAGNOSIS — I120 Hypertensive chronic kidney disease with stage 5 chronic kidney disease or end stage renal disease: Secondary | ICD-10-CM | POA: Insufficient documentation

## 2018-11-01 DIAGNOSIS — R799 Abnormal finding of blood chemistry, unspecified: Secondary | ICD-10-CM | POA: Diagnosis present

## 2018-11-01 DIAGNOSIS — E1140 Type 2 diabetes mellitus with diabetic neuropathy, unspecified: Secondary | ICD-10-CM | POA: Diagnosis not present

## 2018-11-01 DIAGNOSIS — Z794 Long term (current) use of insulin: Secondary | ICD-10-CM | POA: Diagnosis not present

## 2018-11-01 LAB — CBC WITH DIFFERENTIAL/PLATELET
ABS IMMATURE GRANULOCYTES: 0.02 10*3/uL (ref 0.00–0.07)
BASOS PCT: 0 %
Basophils Absolute: 0 10*3/uL (ref 0.0–0.1)
Eosinophils Absolute: 0.1 10*3/uL (ref 0.0–0.5)
Eosinophils Relative: 2 %
HCT: 23.7 % — ABNORMAL LOW (ref 36.0–46.0)
Hemoglobin: 7.4 g/dL — ABNORMAL LOW (ref 12.0–15.0)
Immature Granulocytes: 0 %
Lymphocytes Relative: 25 %
Lymphs Abs: 1.5 10*3/uL (ref 0.7–4.0)
MCH: 32.9 pg (ref 26.0–34.0)
MCHC: 31.2 g/dL (ref 30.0–36.0)
MCV: 105.3 fL — ABNORMAL HIGH (ref 80.0–100.0)
MONO ABS: 0.4 10*3/uL (ref 0.1–1.0)
Monocytes Relative: 7 %
NEUTROS ABS: 4 10*3/uL (ref 1.7–7.7)
Neutrophils Relative %: 66 %
PLATELETS: 180 10*3/uL (ref 150–400)
RBC: 2.25 MIL/uL — AB (ref 3.87–5.11)
RDW: 19.2 % — AB (ref 11.5–15.5)
WBC: 6 10*3/uL (ref 4.0–10.5)
nRBC: 0 % (ref 0.0–0.2)

## 2018-11-01 LAB — BASIC METABOLIC PANEL
Anion gap: 10 (ref 5–15)
BUN: 17 mg/dL (ref 8–23)
CALCIUM: 8.4 mg/dL — AB (ref 8.9–10.3)
CO2: 28 mmol/L (ref 22–32)
Chloride: 98 mmol/L (ref 98–111)
Creatinine, Ser: 3.43 mg/dL — ABNORMAL HIGH (ref 0.44–1.00)
GFR calc Af Amer: 15 mL/min — ABNORMAL LOW (ref >60)
GFR, EST NON AFRICAN AMERICAN: 13 mL/min — AB (ref >60)
Glucose, Bld: 74 mg/dL (ref 70–99)
POTASSIUM: 3.3 mmol/L — AB (ref 3.5–5.1)
SODIUM: 136 mmol/L (ref 135–145)

## 2018-11-01 LAB — PREPARE RBC (CROSSMATCH)

## 2018-11-01 LAB — CBG MONITORING, ED
Glucose-Capillary: 62 mg/dL — ABNORMAL LOW (ref 70–99)
Glucose-Capillary: 108 mg/dL — ABNORMAL HIGH (ref 70–99)

## 2018-11-01 MED ORDER — SODIUM CHLORIDE 0.9% IV SOLUTION: Freq: Once | INTRAVENOUS | Status: DC

## 2018-11-01 NOTE — ED Notes (Signed)
Patient walked to restroom with assistance of rollator.

## 2018-11-01 NOTE — ED Provider Notes (Addendum)
[facility] EMERGENCY DEPARTMENT Provider Note   CSN: [id number] Arrival date & time: 11/01/18  1524    History   Chief Complaint Chief Complaint  Patient presents with  . Abnormal Lab    HPI Tanya Lucero is a 68 y.o. female.     Chief complaint low hemoglobin.  Patient has many health problems including end-stage renal disease on dialysis.  She tends to run a low hemoglobin, but it was lower than usual today at dialysis.  She feels weak.  No fever, sweats, chills, chest pain, worsening dyspnea.  Severity of symptoms is moderate.     Past Medical History:  Diagnosis Date  . Anemia   . Anxiety   . Arthritis   . Chronic kidney disease    neuphrotic syndrome-05/2013  . Constipation   . Decreased vision    left eye  . Depression   . Diabetes mellitus    Type 2  . Diabetic neuropathy ([place])   . Diverticulitis   . Dry skin   . GERD (gastroesophageal reflux disease)   . GI bleed 07/05/2017  . Gout   . Headache   . Hypertension   . Neuropathy   . Shortness of breath dyspnea   . Skin cancer     Patient Active Problem List   Diagnosis Date Noted  . Guaiac positive stools 07/19/2017  . GI bleed 07/05/2017  . Iron deficiency anemia due to chronic blood loss 10/27/2016  . RENAL INSUFFICIENCY 03/04/2010  . DM 02/17/2010  . HYPERTENSION 02/17/2010  . OTHER MALAISE AND FATIGUE 02/17/2010  . SHORTNESS OF BREATH 02/17/2010  . CHEST PAIN, PRECORDIAL 02/17/2010    Past Surgical History:  Procedure Laterality Date  . ABDOMINAL HYSTERECTOMY    . ADENOIDECTOMY    . APPENDECTOMY     taken out with hysterectomy  . AV FISTULA PLACEMENT Right 09/13/2014   Procedure: Creation of Right Arm RADIOCEPHALIC ARTERIOVENOUS (AV) FISTULA ;  Surgeon: [name], MD;  Location: [place];  Service: Vascular;  Laterality: Right;  . BACK SURGERY     lumbar -   . CATARACT EXTRACTION W/PHACO Left 07/02/2013   Procedure: CATARACT EXTRACTION PHACO AND INTRAOCULAR LENS PLACEMENT (IOC);   Surgeon: [name], MD;  [facility];  Service: Ophthalmology;  Laterality: Left;  CDE:  6.46  . CATARACT EXTRACTION W/PHACO Right 07/12/2013   Procedure: CATARACT EXTRACTION PHACO AND INTRAOCULAR LENS PLACEMENT (IOC);  Surgeon: [name], MD;  [facility];  Service: Ophthalmology;  Laterality: Right;  CDE:12.48  . COLONOSCOPY N/A 12/11/2015   Procedure: COLONOSCOPY;  Surgeon: [name], MD;  [facility];  Service: Endoscopy;  Laterality: N/A;  1:55  . ESOPHAGOGASTRODUODENOSCOPY N/A 03/11/2016   Procedure: ESOPHAGOGASTRODUODENOSCOPY (EGD);  Surgeon: [name], MD;  [facility];  Service: Endoscopy;  Laterality: N/A;  1230 - Last pt of the day-Dr. Laural Golden going to office to see pt's  . ESOPHAGOGASTRODUODENOSCOPY N/A 07/21/2017   Procedure: ESOPHAGOGASTRODUODENOSCOPY (EGD);  Surgeon: [name], MD;  [facility];  Service: Endoscopy;  Laterality: N/A;  3:00  . FOOT SURGERY Right    x2-heel spur   . GIVENS CAPSULE STUDY N/A 11/09/2016   Procedure: GIVENS CAPSULE STUDY;  Surgeon: [name], MD;  [facility];  Service: Endoscopy;  Laterality: N/A;  7:30  . REVISON OF ARTERIOVENOUS FISTULA Right 01/05/2016   Procedure: REVISON OF ARTERIOVENOUS FISTULA- right forearm;  Surgeon: [name], MD;  Location:  [facility];  Service: Vascular;  Laterality: Right;  . SKIN CANCER EXCISION     forehead  . TONSILLECTOMY       OB History   No obstetric history on file.      Home Medications    Prior to Admission medications   Medication Sig Start Date End Date Taking? Authorizing Provider  acetaminophen (TYLENOL) 500 MG tablet Take 1,000 mg every 6 (six) hours as needed by mouth (FOR HEADACHES/MINOR DISCOMFORT).   Yes [provider]  ALPRAZolam (XANAX) 0.25 MG tablet Take 0.25 mg by mouth 2 (two) times daily.    Yes [provider]  aluminum-magnesium hydroxide-simethicone (MAALOX) 200-200-20 MG/5ML SUSP Take 30 mLs  by mouth 4 (four) times daily as needed (heartburn/indigestion).   Yes [provider]  amLODipine (NORVASC) 5 MG tablet Take 5 mg by mouth at bedtime. (0800)   Yes [provider]  anti-nausea (EMETROL) solution Take 30 mLs every 15 (fifteen) minutes as needed by mouth for nausea or vomiting (X4 doses only).   Yes [provider]  b complex vitamins capsule Take 1 capsule daily by mouth. (0800)   Yes [provider]  B Complex-Biotin-FA (B-COMPLEX PO) Take 1 tablet by mouth every morning.   Yes [provider]  buPROPion (WELLBUTRIN) 75 MG tablet Take 75 mg daily by mouth. (0800)   Yes [provider]  carvedilol (COREG) 6.25 MG tablet Take 6.25 mg 2 (two) times daily with a meal by mouth. (0800&2000)   Yes [provider]  cholecalciferol (VITAMIN D) 1000 units tablet Take 1,000 Units by mouth daily.   Yes [provider]  fexofenadine (ALLEGRA) 180 MG tablet Take 180 mg daily by mouth. (0800)   Yes [provider]  furosemide (LASIX) 40 MG tablet Take 40 mg daily by mouth. (0800)   Yes [provider]  gabapentin (NEURONTIN) 600 MG tablet Take 600 mg by mouth 2 (two) times daily.    Yes [provider]  glipiZIDE (GLUCOTROL) 5 MG tablet Take 5 mg 2 (two) times daily before a meal by mouth. (0800&2000)   Yes [provider]  guaifenesin (ROBITUSSIN) 100 MG/5ML syrup Take 200 mg every 6 (six) hours as needed by mouth for cough.   Yes [provider]  hydrALAZINE (APRESOLINE) 25 MG tablet Take 25 mg 3 (three) times daily by mouth.   Yes [provider]  insulin glargine (LANTUS) 100 unit/mL SOPN Inject 25-55 Units into the skin See admin instructions. 55units in the morning and 25 units in the evening   Yes [provider]  insulin lispro (HUMALOG) 100 UNIT/ML KiwkPen Inject 10 Units 3 (three) times daily before meals into the skin.   Yes [provider]    insulin regular (NOVOLIN R,HUMULIN R) 100 units/mL injection Inject 15 Units daily as needed into the skin for high blood sugar (SUGARS OVER 400).   Yes [provider]  lidocaine-prilocaine (EMLA) cream Apply 1 application topically every Monday, Wednesday, and Friday with hemodialysis. 1 hour prior to dialysis   Yes [provider]  loperamide (IMODIUM A-D) 2 MG tablet Take 2 mg 4 (four) times daily as needed by mouth for diarrhea or loose stools (up to 8 doses in 24 hrs.).   Yes [provider]  Melatonin 5 MG CAPS Take 10 mg by mouth at bedtime.    Yes [provider]  pantoprazole (PROTONIX) 40 MG tablet Take 40 mg daily at 6 (six) AM by mouth.  Yes [provider]  polyvinyl alcohol (LIQUIFILM TEARS) 1.4 % ophthalmic solution Place 1 drop 5 (five) times daily as needed into both eyes for dry eyes.   Yes [provider]  sertraline (ZOLOFT) 50 MG tablet Take 75 mg by mouth daily.    Yes [provider]  sevelamer carbonate (RENVELA) 800 MG tablet Take 1,600-2,400 mg by mouth See admin instructions. 3 tablets with meals and 2 tablets with snacks   Yes [provider]    Family History Family History  Problem Relation Age of Onset  . Multiple myeloma Mother   . Diabetes Mother   . Heart attack Father 3    Social History Social History   Tobacco Use  . Smoking status: Current Every Day Smoker    Packs/day: 0.25    Years: 40.00    Pack years: 10.00    Types: Cigarettes  . Smokeless tobacco: Never Used  Substance Use Topics  . Alcohol use: No    Alcohol/week: 0.0 standard drinks  . Drug use: No     Allergies   Penicillins   Review of Systems Review of Systems  All other systems reviewed and are negative.    Physical Exam Updated Vital Signs BP (!) 117/51   Pulse 76   Resp (!) 23   Ht _0  (1.575 m)   Wt 98.9 kg   SpO2 93%   BMI 39.87 kg/m   Physical Exam Vitals signs and nursing note  reviewed.  Constitutional:      Appearance: She is well-developed.     Comments: Pale, nad  HENT:     Head: Normocephalic and atraumatic.  Eyes:     Conjunctiva/sclera: Conjunctivae normal.  Neck:     Musculoskeletal: Neck supple.  Cardiovascular:     Rate and Rhythm: Normal rate and regular rhythm.  Pulmonary:     Effort: Pulmonary effort is normal.     Breath sounds: Normal breath sounds.  Abdominal:     General: Bowel sounds are normal.     Palpations: Abdomen is soft.  Musculoskeletal: Normal range of motion.  Skin:    General: Skin is warm and dry.  Neurological:     Mental Status: She is alert and oriented to person, place, and time.  Psychiatric:        Behavior: Behavior normal.      ED Treatments / Results  Labs (all labs ordered are listed, but only abnormal results are displayed) Labs Reviewed  CBC WITH DIFFERENTIAL/PLATELET - Abnormal; Notable for the following components:      Result Value   RBC 2.25 (*)    Hemoglobin 7.4 (*)    HCT 23.7 (*)    MCV 105.3 (*)    RDW 19.2 (*)    All other components within normal limits  BASIC METABOLIC PANEL - Abnormal; Notable for the following components:   Potassium 3.3 (*)    Creatinine, Ser 3.43 (*)    Calcium 8.4 (*)    GFR calc non Af Amer 13 (*)    GFR calc Af Amer 15 (*)    All other components within normal limits  CBG MONITORING, ED - Abnormal; Notable for the following components:   Glucose-Capillary 62 (*)    All other components within normal limits  CBG MONITORING, ED - Abnormal; Notable for the following components:   Glucose-Capillary 108 (*)    All other components within normal limits  TYPE AND SCREEN  PREPARE RBC (CROSSMATCH)  EKG None  Radiology No results found.  Procedures Procedures (including critical care time)  Medications Ordered in ED Medications  0.9 %  sodium chloride infusion (Manually program via Guardrails IV Fluids) (has no administration in time range)      Initial Impression / Assessment and Plan / ED Course  I have reviewed the triage vital signs and the nursing notes.  Pertinent labs & imaging results that were available during my care of the patient were reviewed by me and considered in my medical decision making (see chart for details).        Hemoglobin today 7.4.  Will transfuse 1 unit of packed cells.  Then discharge home.  CRITICAL CARE Performed by: Nat Christen Total critical care time: 30 minutes Critical care time was exclusive of separately billable procedures and treating other patients. Critical care was necessary to treat or prevent imminent or life-threatening deterioration. Critical care was time spent personally by me on the following activities: development of treatment plan with patient and/or surrogate as well as nursing, discussions with consultants, evaluation of patient's response to treatment, examination of patient, obtaining history from patient or surrogate, ordering and performing treatments and interventions, ordering and review of laboratory studies, ordering and review of radiographic studies, pulse oximetry and re-evaluation of patient's condition.  Final Clinical Impressions(s) / ED Diagnoses   Final diagnoses:  Symptomatic anemia    ED Discharge Orders    None       Nat Christen, MD 11/01/18 2234    Nat Christen, MD 11/13/18 1535

## 2018-11-01 NOTE — ED Triage Notes (Signed)
Pt was coming from [place] in [place] after dialysis, received the full tx. Was sent for low hgb 6.7, was not sent with what pt's hgb is normally. Pt had some bleeding after dialysis from site. No noted bleeding at this time. Pt denies rectal bleeding.

## 2018-11-01 NOTE — Discharge Instructions (Signed)
You received 1 unit of blood today.  Follow-up with your normal doctors.

## 2018-11-02 DIAGNOSIS — D649 Anemia, unspecified: Secondary | ICD-10-CM | POA: Diagnosis not present

## 2018-11-02 MED ORDER — ACETAMINOPHEN 325 MG PO TABS
650.0000 mg | ORAL_TABLET | Freq: Once | ORAL | Status: AC
  Administered 2018-11-02: 650 mg via ORAL
  Filled 2018-11-02: qty 2

## 2018-11-02 MED ORDER — LOPERAMIDE HCL 2 MG PO CAPS
4.0000 mg | ORAL_CAPSULE | Freq: Once | ORAL | Status: AC
  Administered 2018-11-02: 4 mg via ORAL
  Filled 2018-11-02: qty 2

## 2018-11-02 NOTE — ED Notes (Signed)
Patient denies pain and is resting comfortably.  

## 2018-11-02 NOTE — ED Notes (Signed)
Blood admin started

## 2018-11-03 LAB — TYPE AND SCREEN
ABO/RH(D): O POS
Antibody Screen: POSITIVE
DONOR AG TYPE: NEGATIVE
Unit division: 0

## 2018-11-03 LAB — BPAM RBC
Blood Product Expiration Date: 202003312359
ISSUE DATE / TIME: 202002271440
Unit Type and Rh: 1700

## 2018-11-07 ENCOUNTER — Encounter (INDEPENDENT_AMBULATORY_CARE_PROVIDER_SITE_OTHER): Payer: Self-pay | Admitting: Internal Medicine

## 2018-11-07 ENCOUNTER — Ambulatory Visit (INDEPENDENT_AMBULATORY_CARE_PROVIDER_SITE_OTHER): Payer: Medicare Other | Admitting: Internal Medicine

## 2018-11-07 VITALS — BP 98/59 | HR 83 | Temp 98.7°F | Ht 63.0 in | Wt 217.3 lb

## 2018-11-07 DIAGNOSIS — D500 Iron deficiency anemia secondary to blood loss (chronic): Secondary | ICD-10-CM | POA: Diagnosis not present

## 2018-11-07 NOTE — Progress Notes (Signed)
Subjective:    Patient ID: Tanya Lucero, female    DOB: [birth year], 68 y.o.   MRN: [id number] Resident of [facility]. HPI Here today for f/u. Last seen in August of 2019. Hx of IDA and chronic GI bleeding. HPI Here today for f/u. Last seen In April of this year by me.  Hx of IDA and chronic GI  Bleeding.  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 Esophageal polyp found. Portal hypertensive gastropathy. Two gastric polyps. Normal cardia,gastric body and pylorus. Normal duodenal bulb and second portion of the duodenum.  Recently seen in the ED for low hemoglobin (11/01/2018). Transfused with one unit of blood. She okay. She says she is sleep and tired.  Appetite is okay. No weight loss. Her BMs are moving okay.  Stools are dark in color.  Hx of chronic renal failure. Dialysis M-W-F.   CBC    Component Value Date/Time   WBC 6.0 11/01/2018 1551   RBC 2.25 (L) 11/01/2018 1551   HGB 7.4 (L) 11/01/2018 1551   HCT 23.7 (L) 11/01/2018 1551   PLT 180 11/01/2018 1551   MCV 105.3 (H) 11/01/2018 1551   MCH 32.9 11/01/2018 1551   MCHC 31.2 11/01/2018 1551   RDW 19.2 (H) 11/01/2018 1551   LYMPHSABS 1.5 11/01/2018 1551   MONOABS 0.4 11/01/2018 1551   EOSABS 0.1 11/01/2018 1551   BASOSABS 0.0 11/01/2018 1551    Review of Systems Past Medical History:  Diagnosis Date  . Anemia   . Anxiety   . Arthritis   . Chronic kidney disease    neuphrotic syndrome-05/2013  . Constipation   . Decreased vision    left eye  . Depression   . Diabetes mellitus    Type 2  . Diabetic neuropathy ([place])   . Diverticulitis   . Dry skin   . GERD (gastroesophageal reflux disease)   . GI bleed 07/05/2017  . Gout   . Headache   . Hypertension   . Neuropathy   . Shortness of breath dyspnea   . Skin cancer     Past Surgical History:  Procedure Laterality Date  . ABDOMINAL HYSTERECTOMY    . ADENOIDECTOMY    . APPENDECTOMY     taken out with hysterectomy  . AV FISTULA PLACEMENT Right 09/13/2014   Procedure: Creation of Right Arm RADIOCEPHALIC ARTERIOVENOUS (AV) FISTULA ;  Surgeon: [name], MD;  Location: No Name;  Service: Vascular;  Laterality: Right;  . BACK SURGERY     lumbar -   . CATARACT EXTRACTION W/PHACO Left 07/02/2013   Procedure: CATARACT EXTRACTION PHACO AND INTRAOCULAR LENS PLACEMENT (IOC);  Surgeon: [name], MD;  Location: [facility];  Service: Ophthalmology;  Laterality: Left;  CDE:  6.46  . CATARACT EXTRACTION W/PHACO Right 07/12/2013   Procedure: CATARACT EXTRACTION PHACO AND INTRAOCULAR LENS PLACEMENT (IOC);  Surgeon: [name], MD;  Location: [facility];  Service: Ophthalmology;  Laterality: Right;  CDE:12.48  . COLONOSCOPY N/A 12/11/2015   Procedure: COLONOSCOPY;  Surgeon: [name], MD;  [facility];  Service: Endoscopy;  Laterality: N/A;  1:55  . ESOPHAGOGASTRODUODENOSCOPY N/A 03/11/2016   Procedure: ESOPHAGOGASTRODUODENOSCOPY (EGD);  Surgeon: [name], MD;  [facility];  Service: Endoscopy;  Laterality: N/A;  1230 - Last pt of the day-Dr. Laural Golden going to office to see pt's  . ESOPHAGOGASTRODUODENOSCOPY N/A 07/21/2017   Procedure: ESOPHAGOGASTRODUODENOSCOPY (EGD);  Surgeon: Hildred Laser  U, MD;  [facility];  Service: Endoscopy;  Laterality: N/A;  3:00  . FOOT SURGERY Right    x2-heel spur   . GIVENS CAPSULE STUDY N/A 11/09/2016   Procedure: GIVENS CAPSULE STUDY;  Surgeon: [name], MD;  [facility];  Service: Endoscopy;  Laterality: N/A;  7:30  . REVISON OF ARTERIOVENOUS FISTULA Right 01/05/2016   Procedure: REVISON OF ARTERIOVENOUS FISTULA- right forearm;  Surgeon: [name], MD;  Location: [facility];  Service: Vascular;  Laterality: Right;  . SKIN CANCER EXCISION     forehead  . TONSILLECTOMY      Allergies  Allergen Reactions  . Penicillins Rash    Has patient had a PCN reaction causing immediate rash, facial/tongue/throat  swelling, SOB or lightheadedness with hypotension: Unknown Has patient had a PCN reaction causing severe rash involving mucus membranes or skin necrosis: Unknown Has patient had a PCN reaction that required hospitalization: Unknown Has patient had a PCN reaction occurring within the last 10 years: Unknown If all of the above answers are "NO", then may proceed with Cephalosporin use.     Current Outpatient Medications on File Prior to Visit  Medication Sig Dispense Refill  . ALPRAZolam (XANAX) 0.25 MG tablet Take 0.25 mg by mouth 2 (two) times daily.     Marland Kitchen amLODipine (NORVASC) 5 MG tablet Take 5 mg by mouth at bedtime. (0800)    . buPROPion (WELLBUTRIN) 75 MG tablet Take 75 mg daily by mouth. (0800)    . cholecalciferol (VITAMIN D) 1000 units tablet Take 1,000 Units by mouth daily.    . fexofenadine (ALLEGRA) 180 MG tablet Take 180 mg daily by mouth. (0800)    . furosemide (LASIX) 40 MG tablet Take 40 mg daily by mouth. (0800)    . gabapentin (NEURONTIN) 600 MG tablet Take 600 mg by mouth 2 (two) times daily.     Marland Kitchen glipiZIDE (GLUCOTROL) 5 MG tablet Take 5 mg 2 (two) times daily before a meal by mouth. (0800&2000)    . guaifenesin (ROBITUSSIN) 100 MG/5ML syrup Take 200 mg every 6 (six) hours as needed by mouth for cough.    . hydrALAZINE (APRESOLINE) 25 MG tablet Take 25 mg 3 (three) times daily by mouth.    . insulin glargine (LANTUS) 100 unit/mL SOPN Inject 55 Units into the skin daily. 55units in the morning and 25 units in the evening    . insulin lispro (HUMALOG) 100 UNIT/ML KiwkPen Inject 10 Units 3 (three) times daily before meals into the skin.    Marland Kitchen loperamide (IMODIUM A-D) 2 MG tablet Take 2 mg 4 (four) times daily as needed by mouth for diarrhea or loose stools (up to 8 doses in 24 hrs.).    Marland Kitchen Melatonin 5 MG CAPS Take 10 mg by mouth at bedtime.     . pantoprazole (PROTONIX) 40 MG tablet Take 40 mg daily at 6 (six) AM by mouth.     . polyvinyl alcohol (LIQUIFILM TEARS) 1.4 %  ophthalmic solution Place 1 drop 5 (five) times daily as needed into both eyes for dry eyes.    Marland Kitchen sertraline (ZOLOFT) 50 MG tablet Take 75 mg by mouth daily.     . sevelamer carbonate (RENVELA) 800 MG tablet Take 1,600-2,400 mg by mouth See admin instructions. 3 tablets with meals and 2 tablets with snacks    . acetaminophen (TYLENOL) 500 MG tablet Take 1,000 mg every 6 (six) hours as needed by mouth (FOR HEADACHES/MINOR DISCOMFORT).    Marland Kitchen  aluminum-magnesium hydroxide-simethicone (MAALOX) 115-726-20 MG/5ML SUSP Take 30 mLs by mouth 4 (four) times daily as needed (heartburn/indigestion).    Marland Kitchen anti-nausea (EMETROL) solution Take 30 mLs every 15 (fifteen) minutes as needed by mouth for nausea or vomiting (X4 doses only).    Marland Kitchen b complex vitamins capsule Take 1 capsule daily by mouth. (0800)    . B Complex-Biotin-FA (B-COMPLEX PO) Take 1 tablet by mouth every morning.    . carvedilol (COREG) 6.25 MG tablet Take 6.25 mg 2 (two) times daily with a meal by mouth. (0800&2000)     No current facility-administered medications on file prior to visit.         Objective:   Physical Exam Blood pressure (!) 98/59, pulse 83, temperature 98.7 F (37.1 C), height 5\' 3"  (1.6 m), weight 217 lb 4.8 oz (98.6 kg). Alert and oriented. Skin warm and dry. Oral mucosa is moist.   . Sclera anicteric, conjunctivae is pink. Thyroid not enlarged. No cervical lymphadenopathy. Lungs clear. Heart regular rate and rhythm.  Abdomen is soft. Bowel sounds are positive. No hepatomegaly. No abdominal masses felt. No tenderness.  No edema to lower extremities. Rectal exam deferred.          Assessment & Plan:  IDA from GI blood loss.  3 stools cards home with patient. Further recommendations to follow. Will get last CBC from [facility].

## 2018-11-07 NOTE — Patient Instructions (Signed)
3 stools cards home with patient.  

## 2018-11-09 ENCOUNTER — Encounter (HOSPITAL_COMMUNITY): Payer: Self-pay | Admitting: Emergency Medicine

## 2018-11-09 ENCOUNTER — Telehealth (INDEPENDENT_AMBULATORY_CARE_PROVIDER_SITE_OTHER): Payer: Self-pay | Admitting: *Deleted

## 2018-11-09 ENCOUNTER — Telehealth (INDEPENDENT_AMBULATORY_CARE_PROVIDER_SITE_OTHER): Payer: Self-pay | Admitting: Internal Medicine

## 2018-11-09 ENCOUNTER — Other Ambulatory Visit: Payer: Self-pay

## 2018-11-09 ENCOUNTER — Observation Stay (HOSPITAL_COMMUNITY)
Admission: EM | Admit: 2018-11-09 | Discharge: 2018-11-10 | Disposition: A | Discharge status: Home / Self Care | Payer: Medicare Other | Attending: Internal Medicine | Admitting: Internal Medicine

## 2018-11-09 DIAGNOSIS — E1122 Type 2 diabetes mellitus with diabetic chronic kidney disease: Secondary | ICD-10-CM | POA: Insufficient documentation

## 2018-11-09 DIAGNOSIS — D500 Iron deficiency anemia secondary to blood loss (chronic): Principal | ICD-10-CM | POA: Insufficient documentation

## 2018-11-09 DIAGNOSIS — Z86010 Personal history of colonic polyps: Secondary | ICD-10-CM | POA: Diagnosis not present

## 2018-11-09 DIAGNOSIS — F419 Anxiety disorder, unspecified: Secondary | ICD-10-CM | POA: Diagnosis not present

## 2018-11-09 DIAGNOSIS — Z79899 Other long term (current) drug therapy: Secondary | ICD-10-CM | POA: Insufficient documentation

## 2018-11-09 DIAGNOSIS — F17210 Nicotine dependence, cigarettes, uncomplicated: Secondary | ICD-10-CM | POA: Diagnosis not present

## 2018-11-09 DIAGNOSIS — N186 End stage renal disease: Secondary | ICD-10-CM | POA: Insufficient documentation

## 2018-11-09 DIAGNOSIS — Z6839 Body mass index (BMI) 39.0-39.9, adult: Secondary | ICD-10-CM | POA: Insufficient documentation

## 2018-11-09 DIAGNOSIS — K5730 Diverticulosis of large intestine without perforation or abscess without bleeding: Secondary | ICD-10-CM | POA: Insufficient documentation

## 2018-11-09 DIAGNOSIS — D631 Anemia in chronic kidney disease: Secondary | ICD-10-CM | POA: Insufficient documentation

## 2018-11-09 DIAGNOSIS — I120 Hypertensive chronic kidney disease with stage 5 chronic kidney disease or end stage renal disease: Secondary | ICD-10-CM | POA: Insufficient documentation

## 2018-11-09 DIAGNOSIS — K219 Gastro-esophageal reflux disease without esophagitis: Secondary | ICD-10-CM | POA: Diagnosis not present

## 2018-11-09 DIAGNOSIS — E1142 Type 2 diabetes mellitus with diabetic polyneuropathy: Secondary | ICD-10-CM | POA: Diagnosis not present

## 2018-11-09 DIAGNOSIS — E1140 Type 2 diabetes mellitus with diabetic neuropathy, unspecified: Secondary | ICD-10-CM | POA: Insufficient documentation

## 2018-11-09 DIAGNOSIS — N2581 Secondary hyperparathyroidism of renal origin: Secondary | ICD-10-CM | POA: Diagnosis not present

## 2018-11-09 DIAGNOSIS — D125 Benign neoplasm of sigmoid colon: Secondary | ICD-10-CM | POA: Insufficient documentation

## 2018-11-09 DIAGNOSIS — Z992 Dependence on renal dialysis: Secondary | ICD-10-CM | POA: Diagnosis present

## 2018-11-09 DIAGNOSIS — D649 Anemia, unspecified: Secondary | ICD-10-CM

## 2018-11-09 DIAGNOSIS — Z794 Long term (current) use of insulin: Secondary | ICD-10-CM | POA: Diagnosis not present

## 2018-11-09 DIAGNOSIS — I10 Essential (primary) hypertension: Secondary | ICD-10-CM

## 2018-11-09 DIAGNOSIS — F329 Major depressive disorder, single episode, unspecified: Secondary | ICD-10-CM | POA: Diagnosis not present

## 2018-11-09 DIAGNOSIS — K922 Gastrointestinal hemorrhage, unspecified: Secondary | ICD-10-CM

## 2018-11-09 DIAGNOSIS — E1121 Type 2 diabetes mellitus with diabetic nephropathy: Secondary | ICD-10-CM

## 2018-11-09 DIAGNOSIS — E039 Hypothyroidism, unspecified: Secondary | ICD-10-CM | POA: Insufficient documentation

## 2018-11-09 DIAGNOSIS — D123 Benign neoplasm of transverse colon: Secondary | ICD-10-CM | POA: Diagnosis not present

## 2018-11-09 LAB — CBC
HCT: 25.1 % — ABNORMAL LOW (ref 36.0–46.0)
Hemoglobin: 7.9 g/dL — ABNORMAL LOW (ref 12.0–15.0)
MCH: 32.4 pg (ref 26.0–34.0)
MCHC: 31.5 g/dL (ref 30.0–36.0)
MCV: 102.9 fL — AB (ref 80.0–100.0)
Platelets: 167 10*3/uL (ref 150–400)
RBC: 2.44 MIL/uL — ABNORMAL LOW (ref 3.87–5.11)
RDW: 18.9 % — ABNORMAL HIGH (ref 11.5–15.5)
WBC: 5.5 10*3/uL (ref 4.0–10.5)
nRBC: 0 % (ref 0.0–0.2)

## 2018-11-09 LAB — COMPREHENSIVE METABOLIC PANEL
ALT: 17 U/L (ref 0–44)
AST: 21 U/L (ref 15–41)
Albumin: 3.8 g/dL (ref 3.5–5.0)
Alkaline Phosphatase: 94 U/L (ref 38–126)
Anion gap: 11 (ref 5–15)
BUN: 34 mg/dL — AB (ref 8–23)
CO2: 28 mmol/L (ref 22–32)
Calcium: 8.7 mg/dL — ABNORMAL LOW (ref 8.9–10.3)
Chloride: 95 mmol/L — ABNORMAL LOW (ref 98–111)
Creatinine, Ser: 5.35 mg/dL — ABNORMAL HIGH (ref 0.44–1.00)
GFR calc Af Amer: 9 mL/min — ABNORMAL LOW (ref >60)
GFR calc non Af Amer: 8 mL/min — ABNORMAL LOW (ref >60)
Glucose, Bld: 148 mg/dL — ABNORMAL HIGH (ref 70–99)
Potassium: 4.1 mmol/L (ref 3.5–5.1)
SODIUM: 134 mmol/L — AB (ref 135–145)
Total Bilirubin: 0.6 mg/dL (ref 0.3–1.2)
Total Protein: 7.2 g/dL (ref 6.5–8.1)

## 2018-11-09 LAB — GLUCOSE, CAPILLARY
GLUCOSE-CAPILLARY: 113 mg/dL — AB (ref 70–99)
Glucose-Capillary: 153 mg/dL — ABNORMAL HIGH (ref 70–99)

## 2018-11-09 LAB — HEMOGLOBIN A1C
HEMOGLOBIN A1C: 4.3 % — AB (ref 4.8–5.6)
Mean Plasma Glucose: 76.71 mg/dL

## 2018-11-09 LAB — PREPARE RBC (CROSSMATCH)

## 2018-11-09 MED ORDER — ACETAMINOPHEN 500 MG PO TABS
1000.0000 mg | ORAL_TABLET | Freq: Four times a day (QID) | ORAL | Status: DC | PRN
  Administered 2018-11-09 – 2018-11-10 (×2): 1000 mg via ORAL
  Filled 2018-11-09 (×2): qty 2

## 2018-11-09 MED ORDER — BUPROPION HCL 75 MG PO TABS
75.0000 mg | ORAL_TABLET | Freq: Every day | ORAL | Status: DC
  Filled 2018-11-09 (×4): qty 1

## 2018-11-09 MED ORDER — SODIUM CHLORIDE 0.9 % IV SOLN
10.0000 mL/h | Freq: Once | INTRAVENOUS | Status: AC
  Administered 2018-11-09: 10 mL/h via INTRAVENOUS

## 2018-11-09 MED ORDER — SEVELAMER CARBONATE 800 MG PO TABS
2400.0000 mg | ORAL_TABLET | Freq: Three times a day (TID) | ORAL | Status: DC
  Administered 2018-11-09 – 2018-11-10 (×2): 2400 mg via ORAL
  Filled 2018-11-09 (×5): qty 3

## 2018-11-09 MED ORDER — POLYVINYL ALCOHOL 1.4 % OP SOLN
1.0000 [drp] | Freq: Every day | OPHTHALMIC | Status: DC | PRN
  Filled 2018-11-09: qty 15

## 2018-11-09 MED ORDER — SODIUM CHLORIDE 0.9% FLUSH: 3.0000 mL | INTRAVENOUS | Status: DC | PRN

## 2018-11-09 MED ORDER — ONDANSETRON HCL 4 MG PO TABS: 4.0000 mg | ORAL_TABLET | Freq: Four times a day (QID) | ORAL | Status: DC | PRN

## 2018-11-09 MED ORDER — SEVELAMER CARBONATE 800 MG PO TABS
1600.0000 mg | ORAL_TABLET | ORAL | Status: DC
  Administered 2018-11-10: 1600 mg via ORAL
  Filled 2018-11-09 (×3): qty 2

## 2018-11-09 MED ORDER — INSULIN GLARGINE 100 UNITS/ML SOLOSTAR PEN
30.0000 [IU] | PEN_INJECTOR | Freq: Two times a day (BID) | SUBCUTANEOUS | Status: DC
  Filled 2018-11-09: qty 3

## 2018-11-09 MED ORDER — INSULIN ASPART 100 UNIT/ML ~~LOC~~ SOLN
0.0000 [IU] | Freq: Three times a day (TID) | SUBCUTANEOUS | Status: DC
  Administered 2018-11-09: 2 [IU] via SUBCUTANEOUS

## 2018-11-09 MED ORDER — SODIUM CHLORIDE 0.9 % IV SOLN: 250.0000 mL | INTRAVENOUS | Status: DC | PRN

## 2018-11-09 MED ORDER — VITAMIN D 25 MCG (1000 UNIT) PO TABS
1000.0000 [IU] | ORAL_TABLET | Freq: Every day | ORAL | Status: DC
  Administered 2018-11-10: 1000 [IU] via ORAL
  Filled 2018-11-09 (×2): qty 1

## 2018-11-09 MED ORDER — CARVEDILOL 3.125 MG PO TABS
6.2500 mg | ORAL_TABLET | Freq: Two times a day (BID) | ORAL | Status: DC
  Administered 2018-11-09: 6.25 mg via ORAL
  Filled 2018-11-09: qty 2

## 2018-11-09 MED ORDER — GUAIFENESIN 100 MG/5ML PO SOLN: 200.0000 mg | Freq: Four times a day (QID) | ORAL | Status: DC | PRN

## 2018-11-09 MED ORDER — INSULIN GLARGINE 100 UNIT/ML ~~LOC~~ SOLN
30.0000 [IU] | Freq: Two times a day (BID) | SUBCUTANEOUS | Status: DC
  Administered 2018-11-09: 30 [IU] via SUBCUTANEOUS
  Filled 2018-11-09 (×8): qty 0.3

## 2018-11-09 MED ORDER — SODIUM CHLORIDE 0.9% FLUSH
3.0000 mL | Freq: Two times a day (BID) | INTRAVENOUS | Status: DC
  Administered 2018-11-10: 3 mL via INTRAVENOUS

## 2018-11-09 MED ORDER — ALPRAZOLAM 0.25 MG PO TABS
0.2500 mg | ORAL_TABLET | Freq: Two times a day (BID) | ORAL | Status: DC
  Administered 2018-11-09 – 2018-11-10 (×2): 0.25 mg via ORAL
  Filled 2018-11-09 (×2): qty 1

## 2018-11-09 MED ORDER — AMLODIPINE BESYLATE 5 MG PO TABS
5.0000 mg | ORAL_TABLET | Freq: Every day | ORAL | Status: DC
  Administered 2018-11-09: 5 mg via ORAL
  Filled 2018-11-09: qty 1

## 2018-11-09 MED ORDER — ONDANSETRON HCL 4 MG/2ML IJ SOLN: 4.0000 mg | Freq: Four times a day (QID) | INTRAMUSCULAR | Status: DC | PRN

## 2018-11-09 MED ORDER — ALUM & MAG HYDROXIDE-SIMETH 200-200-20 MG/5ML PO SUSP: 30.0000 mL | Freq: Four times a day (QID) | ORAL | Status: DC | PRN

## 2018-11-09 MED ORDER — HYDRALAZINE HCL 25 MG PO TABS
25.0000 mg | ORAL_TABLET | Freq: Three times a day (TID) | ORAL | Status: DC
  Administered 2018-11-09 – 2018-11-10 (×2): 25 mg via ORAL
  Filled 2018-11-09 (×3): qty 1

## 2018-11-09 MED ORDER — PANTOPRAZOLE SODIUM 40 MG PO TBEC
40.0000 mg | DELAYED_RELEASE_TABLET | Freq: Two times a day (BID) | ORAL | Status: DC
  Administered 2018-11-09 – 2018-11-10 (×3): 40 mg via ORAL
  Filled 2018-11-09 (×3): qty 1

## 2018-11-09 MED ORDER — SERTRALINE HCL 50 MG PO TABS
75.0000 mg | ORAL_TABLET | Freq: Every day | ORAL | Status: DC
  Administered 2018-11-10: 75 mg via ORAL
  Filled 2018-11-09: qty 2

## 2018-11-09 MED ORDER — GABAPENTIN 300 MG PO CAPS
600.0000 mg | ORAL_CAPSULE | Freq: Two times a day (BID) | ORAL | Status: DC
  Administered 2018-11-09: 600 mg via ORAL
  Filled 2018-11-09 (×2): qty 2

## 2018-11-09 NOTE — Progress Notes (Addendum)
Patient ID: Tanya Lucero, female   DOB: 1951-01-07, 68 y.o.   MRN: [id number] Resident of [facility].  Tanya Lucero was referred to the ED by me today. Her hemoglobin at [facility] was 7.3.  Hemoglobin in the ED this moring was 7.9. Hx of IDA and chronic GI bleeding.  Recently transfused in the ED in February and received one unit of PRBCs.  No hematemesis. Her appetite has remained good. She says her stools have been black for a long time.   . 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 Esophageal polyp found. Portal hypertensive gastropathy. Two gastric polyps. Normal cardia,gastric body and pylorus. Normal duodenal bulb and second portion of the duodenum.  She is a dialysis patient and receives dialysis M-W-F. Hx of diabetes since the early 2000s. PCP Dr. [name].   11/09/2016 Given's capsule. Anemia and heme positive stool. Findings:  Patient was able to swallow given without any difficulty. Study duration 7 hours 54 minutes and 8 seconds. Gastric mucosa with erythema edema and to ileus covered with specks of blood or coffee-ground. No active bleeding noted. Diffuse pigmentation noted to do dental mucosa felt to be due to hemosiderin. This finding was also confirmed on EGD with biopsy in July 2017. No other small bowel lesions noted.  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 Heme positive stool.  Impression: - Normal esophagus. - Z-line regular, 40 cm from the incisors. - Nodular mucosa in the gastric antrum. 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 Biopsied. - Normal duodenal bulb. - Mucosal pigmentation in the duodenum. Biopsied.    12/11/2015 Colonoscopy: Guaiac positive stool:  Impression: - Two 4 to 5 mm polyps at the recto-sigmoid  colon  and in the cecum, removed with a cold snare.  Resected and retrieved. - One 12 mm polyp at the hepatic flexure, removed  with a hot snare. Resected and retrieved. - One 8 mm polyp in the transverse colon, removed  with a hot snare. Resected and retrieved. - Diverticulosis in the sigmoid colon. - External hemorrhoids.  He had 4 polyps removed. 2 are tubular adenomas.   Alert. Skin warm and dry.  Her stool is dark brown and guaiac positive.  Assessment: Probably UGIB. Dr. Laural Golden is aware. Will be transfused.Further recommendations to follow.  GI attending note: Patient is 68 year old Caucasian female with multiple medical problems including diabetes mellitus and end-stage renal disease was on hemodialysis who presents with acute on chronic anemia.  Patient was transfused 1 unit of PRBCs last week and her hemoglobin is down again. She has well-documented history of heme positive stool and iron deficiency anemia dating back to 2017.  She has undergone EGD colonoscopy as well as small bowel given capsule study in the past but without pinpointing the source of GI bleed.  Patient's MCV is gradually going up.  It remains to be seen if she has yet another source of her anemia in addition to chronic disease as well as occult GI bleed. Patient denies rectal bleeding or melena.  Patient reports poor vision and unable to tell the color of stool.  She has intermittent diarrhea and takes Imodium OTC once or twice a week.  She denies nausea vomiting dysphagia or abdominal pain.  She is presently not on aspirin or other NSAIDs.  She is also not taking any antiplatelet agents. It is pertinent for pale mucosa grade 2/6 systolic ejection murmur best heard at aortic area.  Abdomen is protuberant but  soft and nontender with organomegaly or masses.  Assessment:  Acute on chronic anemia requiring transfusion. She has well-documented history of iron deficiency anemia and heme positive stool but no evidence of overt GI bleed. Need to rule out B12 deficiency.  Will reassess iron stores. Options include repeating small bowel given capsule study or CT angios abdomen and pelvis which will need to be coordinated with nephrology service so the study could be done prior to hemodialysis. Agree with plans for transfusion. Further recommendations to be made on 11/10/2018.  Discussed with Dr. Dyann Kief.

## 2018-11-09 NOTE — ED Notes (Signed)
Awaiting pharmacy to verify medications.

## 2018-11-09 NOTE — ED Provider Notes (Signed)
[facility] EMERGENCY DEPARTMENT Provider Note   CSN: [id number] Arrival date & time: 11/09/18  3403    History   Chief Complaint Chief Complaint  Patient presents with  . Abnormal Lab    HPI Tanya Lucero is a 68 y.o. female.     Patient referred in from Dr. Rosina Lowenstein office lobe and of 6.9 that was checked after dialysis on Wednesday.  Patient is a dialysis patient normally dialyzed Monday Wednesdays and Fridays.  She was dialyzed and Monday and Wednesday.  Patient's been struggling with anemia felt to be due to chronic GI blood loss.  Dr. Rosina Lowenstein office from gastroenterology was planning fiberoptic upper endoscopy.  As an outpatient.  Patient fairly asymptomatic has had fatigue now for a while a little bit of lightheadedness.  Lightheadedness been a little worse for the past week but no acute problems no history of vomiting blood.  But has had black stools on and off but some confusion about iron does not sound like she is taking oral iron however but given iron during dialysis.  Last month patient received 1 unit blood transfusion in the emergency department and was discharged home.  Hemoglobin at that time was 7.3.      Past Medical History:  Diagnosis Date  . Anemia   . Anxiety   . Arthritis   . Chronic kidney disease    neuphrotic syndrome-05/2013  . Constipation   . Decreased vision    left eye  . Depression   . Diabetes mellitus    Type 2  . Diabetic neuropathy ([place])   . Diverticulitis   . Dry skin   . GERD (gastroesophageal reflux disease)   . GI bleed 07/05/2017  . Gout   . Headache   . Hypertension   . Neuropathy   . Shortness of breath dyspnea   . Skin cancer     Patient Active Problem List   Diagnosis Date Noted  . Symptomatic anemia 11/09/2018  . ESRD (end stage renal disease) ([place]) 11/09/2018  . Anxiety and depression 11/09/2018  . Secondary hyperparathyroidism of renal origin ([place]) 11/09/2018  . Diabetic neuropathy ([place]) 11/09/2018  . Guaiac  positive stools 07/19/2017  . GI bleed 07/05/2017  . Iron deficiency anemia due to chronic blood loss 10/27/2016  . RENAL INSUFFICIENCY 03/04/2010  . Type 2 diabetes with nephropathy ([place]) 02/17/2010  . Essential hypertension 02/17/2010  . OTHER MALAISE AND FATIGUE 02/17/2010  . SHORTNESS OF BREATH 02/17/2010  . CHEST PAIN, PRECORDIAL 02/17/2010    Past Surgical History:  Procedure Laterality Date  . ABDOMINAL HYSTERECTOMY    . ADENOIDECTOMY    . APPENDECTOMY     taken out with hysterectomy  . AV FISTULA PLACEMENT Right 09/13/2014   Procedure: Creation of Right Arm RADIOCEPHALIC ARTERIOVENOUS (AV) FISTULA ;  Surgeon: [name], MD;  Location: [facility];  Service: Vascular;  Laterality: Right;  . BACK SURGERY     lumbar -   . CATARACT EXTRACTION W/PHACO Left 07/02/2013   Procedure: CATARACT EXTRACTION PHACO AND INTRAOCULAR LENS PLACEMENT (IOC);  Surgeon: [name], MD;  Location: [facility];  Service: Ophthalmology;  Laterality: Left;  CDE:  6.46  . CATARACT EXTRACTION W/PHACO Right 07/12/2013   Procedure: CATARACT EXTRACTION PHACO AND INTRAOCULAR LENS PLACEMENT (IOC);  Surgeon: [name], MD;  Location: [facility];  Service: Ophthalmology;  Laterality: Right;  CDE:12.48  . COLONOSCOPY N/A 12/11/2015   Procedure: COLONOSCOPY;  Surgeon: [name], MD;  [facility];  Service: Endoscopy;  Laterality: N/A;  1:55  . ESOPHAGOGASTRODUODENOSCOPY N/A 03/11/2016   Procedure: ESOPHAGOGASTRODUODENOSCOPY (EGD);  Surgeon: [name], MD;  [facility];  Service: Endoscopy;  Laterality: N/A;  1230 - Last pt of the day-Dr. Laural Golden going to office to see pt's  . ESOPHAGOGASTRODUODENOSCOPY N/A 07/21/2017   Procedure: ESOPHAGOGASTRODUODENOSCOPY (EGD);  Surgeon: [name], MD;  [facility];  Service: Endoscopy;  Laterality: N/A;  3:00  . FOOT SURGERY Right    x2-heel spur   . GIVENS CAPSULE STUDY N/A 11/09/2016   Procedure: GIVENS CAPSULE STUDY;  Surgeon: [name], MD;  [facility];  Service: Endoscopy;  Laterality: N/A;  7:30  . REVISON OF ARTERIOVENOUS FISTULA Right 01/05/2016   Procedure: REVISON OF ARTERIOVENOUS FISTULA- right forearm;  Surgeon: [name], MD;  Location: [place];  Service: Vascular;  Laterality: Right;  . SKIN CANCER EXCISION     forehead  . TONSILLECTOMY       OB History   No obstetric history on file.      Home Medications    Prior to Admission medications   Medication Sig Start Date End Date Taking? Authorizing Provider  acetaminophen (TYLENOL) 500 MG tablet Take 1,000 mg every 6 (six) hours as needed by mouth (FOR HEADACHES/MINOR DISCOMFORT).    [provider]  ALPRAZolam Duanne Moron) 0.25 MG tablet Take 0.25 mg by mouth 2 (two) times daily.     [provider]  aluminum-magnesium hydroxide-simethicone (MAALOX) 200-200-20 MG/5ML SUSP Take 30 mLs by mouth 4 (four) times daily as needed (heartburn/indigestion).    [provider]  amLODipine (NORVASC) 5 MG tablet Take 5 mg by mouth at bedtime. (0800)    [provider]  anti-nausea (EMETROL) solution Take 30 mLs every 15 (fifteen) minutes as needed by mouth for nausea or vomiting (X4 doses only).    [provider]  b complex vitamins capsule Take 1 capsule daily by mouth. (0800)    [provider]  B Complex-Biotin-FA (B-COMPLEX PO) Take 1 tablet by mouth every morning.    [provider]  buPROPion (WELLBUTRIN) 75 MG tablet Take 75 mg daily by mouth. (0800)    [provider]  carvedilol (COREG) 6.25 MG tablet Take 6.25 mg 2 (two) times daily with a meal by mouth. (0800&2000)    [provider]  cholecalciferol (VITAMIN D) 1000 units tablet Take 1,000 Units by mouth daily.    [provider]  fexofenadine (ALLEGRA) 180 MG tablet Take 180 mg daily by mouth. (0800)    [provider]  furosemide (LASIX) 40 MG tablet Take 40 mg daily by mouth. (0800)    [provider]  gabapentin (NEURONTIN) 600 MG tablet Take 600 mg by mouth 2 (two) times daily.     [provider]  glipiZIDE (GLUCOTROL) 5 MG tablet Take 5 mg 2 (two) times daily before a meal by mouth. (0800&2000)    [provider]  guaifenesin (ROBITUSSIN) 100 MG/5ML syrup Take 200 mg every 6 (six) hours as needed by mouth for cough.    [provider]  hydrALAZINE (APRESOLINE) 25 MG tablet Take 25 mg 3 (three) times daily by mouth.    [provider]  insulin glargine (LANTUS) 100 unit/mL SOPN Inject 55 Units into the skin daily. 55units in the morning and 25 units in the evening    [provider]  insulin lispro (HUMALOG) 100 UNIT/ML KiwkPen Inject 10 Units  3 (three) times daily before meals into the skin.    [provider]  loperamide (IMODIUM A-D) 2 MG tablet Take 2 mg 4 (four) times daily as needed by mouth for diarrhea or loose stools (up to 8 doses in 24 hrs.).    [provider]  Melatonin 5 MG CAPS Take 10 mg by mouth at bedtime.     [provider]  pantoprazole (PROTONIX) 40 MG tablet Take 40 mg daily at 6 (six) AM by mouth.     [provider]  polyvinyl alcohol (LIQUIFILM TEARS) 1.4 % ophthalmic solution Place 1 drop 5 (five) times daily as needed into both eyes for dry eyes.    [provider]  sertraline (ZOLOFT) 50 MG tablet Take 75 mg by mouth daily.     [provider]  sevelamer carbonate (RENVELA) 800 MG tablet Take 1,600-2,400 mg by mouth See admin instructions. 3 tablets with meals and 2 tablets with snacks    [provider]    Family History Family History  Problem Relation Age of Onset  . Multiple myeloma Mother   . Diabetes Mother   . Heart attack Father 82    Social History Social History   Tobacco Use  . Smoking status: Current Every Day Smoker    Packs/day: 0.25    Years: 40.00    Pack years: 10.00    Types: Cigarettes  . Smokeless tobacco:  Never Used  Substance Use Topics  . Alcohol use: No    Alcohol/week: 0.0 standard drinks  . Drug use: No     Allergies   Penicillins   Review of Systems Review of Systems  Constitutional: Positive for fatigue. Negative for chills and fever.  HENT: Negative for rhinorrhea and sore throat.   Eyes: Negative for visual disturbance.  Respiratory: Negative for cough and shortness of breath.   Cardiovascular: Negative for chest pain and leg swelling.  Gastrointestinal: Negative for abdominal pain, diarrhea, nausea and vomiting.  Genitourinary: Negative for dysuria.  Musculoskeletal: Negative for back pain and neck pain.  Skin: Negative for rash.  Allergic/Immunologic: Positive for immunocompromised state.  Neurological: Positive for light-headedness. Negative for dizziness and headaches.  Hematological: Bruises/bleeds easily.  Psychiatric/Behavioral: Negative for confusion.     Physical Exam Updated Vital Signs BP (!) 167/61   Pulse 66   Temp 98.2 F (36.8 C) (Oral)   Resp 16   Ht 1.575 m (5' 2")   Wt 98.4 kg   SpO2 94%   BMI 39.69 kg/m   Physical Exam Vitals signs and nursing note reviewed.  Constitutional:      General: She is not in acute distress.    Appearance: Normal appearance. She is well-developed.  HENT:     Head: Normocephalic and atraumatic.     Nose: No congestion.     Mouth/Throat:     Mouth: Mucous membranes are moist.  Eyes:     Extraocular Movements: Extraocular movements intact.     Conjunctiva/sclera: Conjunctivae normal.     Pupils: Pupils are equal, round, and reactive to light.  Neck:     Musculoskeletal: Neck supple.  Cardiovascular:     Rate and Rhythm: Normal rate and regular rhythm.     Heart sounds: No murmur.  Pulmonary:     Effort: Pulmonary effort is normal. No respiratory distress.     Breath sounds: Normal breath sounds.  Abdominal:     General: Bowel sounds are normal.     Palpations: Abdomen is  soft.     Tenderness: There  is no abdominal tenderness.  Musculoskeletal: Normal range of motion.     Comments: AV fistula with good thrill right upper extremity  Skin:    General: Skin is warm and dry.     Capillary Refill: Capillary refill takes less than 2 seconds.  Neurological:     General: No focal deficit present.     Mental Status: She is alert and oriented to person, place, and time.     Cranial Nerves: No cranial nerve deficit.     Sensory: No sensory deficit.     Motor: No weakness.      ED Treatments / Results  Labs (all labs ordered are listed, but only abnormal results are displayed) Labs Reviewed  COMPREHENSIVE METABOLIC PANEL - Abnormal; Notable for the following components:      Result Value   Sodium 134 (*)    Chloride 95 (*)    Glucose, Bld 148 (*)    BUN 34 (*)    Creatinine, Ser 5.35 (*)    Calcium 8.7 (*)    GFR calc non Af Amer 8 (*)    GFR calc Af Amer 9 (*)    All other components within normal limits  CBC - Abnormal; Notable for the following components:   RBC 2.44 (*)    Hemoglobin 7.9 (*)    HCT 25.1 (*)    MCV 102.9 (*)    RDW 18.9 (*)    All other components within normal limits  HIV ANTIBODY (ROUTINE TESTING W REFLEX)  HEMOGLOBIN A1C  POC OCCULT BLOOD, ED  TYPE AND SCREEN  PREPARE RBC (CROSSMATCH)    EKG None  Radiology No results found.  Procedures Procedures (including critical care time)  CRITICAL CARE Performed by: Fredia Sorrow Total critical care time: 30 minutes Critical care time was exclusive of separately billable procedures and treating other patients. Critical care was necessary to treat or prevent imminent or life-threatening deterioration. Critical care was time spent personally by me on the following activities: development of treatment plan with patient and/or surrogate as well as nursing, discussions with consultants, evaluation of patient's response to treatment, examination of patient, obtaining history from patient or surrogate,  ordering and performing treatments and interventions, ordering and review of laboratory studies, ordering and review of radiographic studies, pulse oximetry and re-evaluation of patient's condition.   Medications Ordered in ED Medications  pantoprazole (PROTONIX) EC tablet 40 mg (40 mg Oral Given 11/09/18 1248)  acetaminophen (TYLENOL) tablet 1,000 mg (has no administration in time range)  carvedilol (COREG) tablet 6.25 mg (has no administration in time range)  amLODipine (NORVASC) tablet 5 mg (has no administration in time range)  hydrALAZINE (APRESOLINE) tablet 25 mg (has no administration in time range)  ALPRAZolam (XANAX) tablet 0.25 mg (has no administration in time range)  buPROPion [facility]) tablet 75 mg (has no administration in time range)  sertraline (ZOLOFT) tablet 75 mg (75 mg Oral Not Given 11/09/18 1547)  insulin glargine (LANTUS) Solostar Pen 30 Units (has no administration in time range)  sevelamer carbonate (RENVELA) tablet 2,400 mg (has no administration in time range)  alum & mag hydroxide-simeth (MAALOX/MYLANTA) 200-200-20 MG/5ML suspension 30 mL (has no administration in time range)  gabapentin (NEURONTIN) capsule 600 mg (has no administration in time range)  guaiFENesin (ROBITUSSIN) 100 MG/5ML solution 200 mg (has no administration in time range)  polyvinyl alcohol (LIQUIFILM TEARS) 1.4 % ophthalmic solution 1 drop (has no administration in time range)  cholecalciferol (VITAMIN D3) tablet 1,000 Units (has no administration in time range)  ondansetron (ZOFRAN) tablet 4 mg (has no administration in time range)    Or  ondansetron (ZOFRAN) injection 4 mg (has no administration in time range)  sodium chloride flush (NS) 0.9 % injection 3 mL (has no administration in time range)  sodium chloride flush (NS) 0.9 % injection 3 mL (has no administration in time range)  0.9 %  sodium chloride infusion (has no administration in time range)  insulin aspart (novoLOG) injection 0-9  Units (has no administration in time range)  sevelamer carbonate (RENVELA) tablet 1,600 mg (has no administration in time range)  0.9 %  sodium chloride infusion (0 mL/hr Intravenous Stopped 11/09/18 1407)     Initial Impression / Assessment and Plan / ED Course  I have reviewed the triage vital signs and the nursing notes.  Pertinent labs & imaging results that were available during my care of the patient were reviewed by me and considered in my medical decision making (see chart for details).       Discussed with Dr. Melony Overly and also discussed with Dr. Justin Mend from nephrology.  Dr. Tera Helper would like to go ahead and get her admitted for blood transfusion and then he is going to consider upper endoscopy for further evaluation.  He was planning to do this.  Patient is also a dialysis patient normally dialyzed Monday Wednesdays and Fridays.  Was dialyzed yesterday and Monday of this week.  Currently electrolytes are good.  Nephrology aware in case patient requires dialysis tomorrow which is likely.  Hospitalist will admit for the blood transfusion Dr. Melony Overly will see from a GI standpoint.  Patient hemodynamically stable.  Hemoglobin of 6.9 reported following dialysis on Wednesday is today 7.9.  Patient's had black stools on and off for a period of time.  No red blood.  Blood transfusion of 2 units ordered.   Final Clinical Impressions(s) / ED Diagnoses   Final diagnoses:  Symptomatic anemia  Anemia due to GI blood loss  ESRD on dialysis [facility])    ED Discharge Orders    None       Fredia Sorrow, MD 11/09/18 1553

## 2018-11-09 NOTE — H&P (Signed)
History and Physical    Tanya Lucero CHE:[id number] DOB: 1951-08-10 DOA: 11/09/2018  Referring MD/NP/PA: Dr. Denyse Amass  PCP: [name], DO  Patient coming from: ALF  Chief Complaint: Generalized weakness and fatigue; low hemoglobin.  HPI: Tanya Lucero is a 68 y.o. 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  She is actively followed by Dr. Laural Golden (gastroenterology service); and with upcoming plans of potential endoscopic procedures due to this ongoing chronic GI bleeding.  After reviewing her records she was seen around 11/01/2018 in the emergency department secondary to symptomatic anemia as well and at that time receive 1 unit of PRBCs and was discharged home with instruction to follow-up as an outpatient.  Patient expressed that since then she has noticed intermittent episode of melanotic stool and has continue experiencing generalized weakness and fatigue. Patient denies chest pain, shortness of breath, nausea, vomiting, hematemesis, abdominal pain, focal weakness, headaches or any other complaints.  In the ED hemoglobin 7.9, positive fecal occult blood test and otherwise hemodynamically stable and with a stable electrolytes/renal function for her chronic condition.  GI service was consulted and [facility] contacted to place patient in observation for anticipated acute work-up for GI bleed.  Patient received type and screen and 2 units of PRBCs has been ordered.  Nephrology services was also consulted to resume hemodialysis treatment while hospitalized.  Past Medical/Surgical History: Past Medical History:  Diagnosis Date  .  Anemia   . Anxiety   . Arthritis   . Chronic kidney disease    neuphrotic syndrome-05/2013  . Constipation   . Decreased vision    left eye  . Depression   . Diabetes mellitus    Type 2  . Diabetic neuropathy ([place])   . Diverticulitis   . Dry skin   . GERD (gastroesophageal reflux disease)   . GI bleed 07/05/2017  . Gout   . Headache   . Hypertension   . Neuropathy   . Shortness of breath dyspnea   . Skin cancer     Past Surgical History:  Procedure Laterality Date  . ABDOMINAL HYSTERECTOMY    . ADENOIDECTOMY    . APPENDECTOMY     taken out with hysterectomy  . AV FISTULA PLACEMENT Right 09/13/2014   Procedure: Creation of Right Arm RADIOCEPHALIC ARTERIOVENOUS (AV) FISTULA ;  Surgeon: [name], MD;  Location: [place];  Service: Vascular;  Laterality: Right;  . BACK SURGERY     lumbar -   . CATARACT EXTRACTION W/PHACO Left 07/02/2013   Procedure: CATARACT EXTRACTION PHACO AND INTRAOCULAR LENS PLACEMENT (IOC);  Surgeon: [name], MD;  [facility];  Service: Ophthalmology;  Laterality: Left;  CDE:  6.46  . CATARACT EXTRACTION W/PHACO Right 07/12/2013   Procedure: CATARACT EXTRACTION PHACO AND INTRAOCULAR LENS PLACEMENT (IOC);  Surgeon: [name], MD;  Location: [facility];  Service: Ophthalmology;  Laterality: Right;  CDE:12.48  . COLONOSCOPY N/A 12/11/2015   Procedure: COLONOSCOPY;  Surgeon: [name], MD;  [facility];  Service: Endoscopy;  Laterality: N/A;  1:55  . ESOPHAGOGASTRODUODENOSCOPY N/A 03/11/2016   Procedure: ESOPHAGOGASTRODUODENOSCOPY (EGD);  Surgeon: [name], MD;  [facility];  Service: Endoscopy;  Laterality: N/A;  1230 -  Last pt of the day-Dr. Laural Golden going to office to see pt's  . ESOPHAGOGASTRODUODENOSCOPY N/A 07/21/2017   Procedure: ESOPHAGOGASTRODUODENOSCOPY (EGD);  Surgeon: [name], MD;  [facility];  Service: Endoscopy;  Laterality: N/A;  3:00  . FOOT SURGERY Right    x2-heel spur   . GIVENS  CAPSULE STUDY N/A 11/09/2016   Procedure: GIVENS CAPSULE STUDY;  Surgeon: [name], MD;  [facility];  Service: Endoscopy;  Laterality: N/A;  7:30  . REVISON OF ARTERIOVENOUS FISTULA Right 01/05/2016   Procedure: REVISON OF ARTERIOVENOUS FISTULA- right forearm;  Surgeon: [name], MD;  Location: [facility];  Service: Vascular;  Laterality: Right;  . SKIN CANCER EXCISION     forehead  . TONSILLECTOMY      Social History:  reports that she has been smoking cigarettes. She has a 10.00 pack-year smoking history. She has never used smokeless tobacco. She reports that she does not drink alcohol or use drugs.  Allergies: Allergies  Allergen Reactions  . Penicillins Rash    Has patient had a PCN reaction causing immediate rash, facial/tongue/throat swelling, SOB or lightheadedness with hypotension: Unknown Has patient had a PCN reaction causing severe rash involving mucus membranes or skin necrosis: Unknown Has patient had a PCN reaction that required hospitalization: Unknown Has patient had a PCN reaction occurring within the last 10 years: Unknown If all of the above answers are "NO", then may proceed with Cephalosporin use.     Family History:  Family History  Problem Relation Age of Onset  . Multiple myeloma Mother   . Diabetes Mother   . Heart attack Father 41    Prior to Admission medications   Medication Sig Start Date End Date Taking? Authorizing Provider  acetaminophen (TYLENOL) 500 MG tablet Take 1,000 mg every 6 (six) hours as needed by mouth (FOR HEADACHES/MINOR DISCOMFORT).   Yes [provider]  ALPRAZolam (XANAX) 0.25 MG tablet Take 0.25 mg by mouth 2 (two) times daily.    Yes [provider]  aluminum-magnesium hydroxide-simethicone (MAALOX) 200-200-20 MG/5ML SUSP Take 30 mLs by mouth 4 (four) times daily as needed (heartburn/indigestion).   Yes [provider]  amLODipine (NORVASC) 5 MG tablet Take 5 mg by mouth at bedtime. (0800)    Yes [provider]  anti-nausea (EMETROL) solution Take 30 mLs every 15 (fifteen) minutes as needed by mouth for nausea or vomiting (X4 doses only).   Yes [provider]  b complex vitamins capsule Take 1 capsule daily by mouth. (0800)   Yes [provider]  B Complex-Biotin-FA (B-COMPLEX PO) Take 1 tablet by mouth every morning.   Yes [provider]  buPROPion (WELLBUTRIN) 75 MG tablet Take 75 mg daily by mouth. (0800)   Yes [provider]  carvedilol (COREG) 6.25 MG tablet Take 6.25 mg 2 (two) times daily with a meal by mouth. (0800&2000)   Yes [provider]  cholecalciferol (VITAMIN D) 1000 units tablet Take 1,000 Units by mouth daily.   Yes [provider]  fexofenadine (ALLEGRA) 180 MG tablet Take 180 mg daily by mouth. (0800)   Yes [provider]  furosemide (LASIX) 20 MG tablet Take 20 mg by mouth daily as needed for fluid (swelling).   Yes [provider]  furosemide (LASIX) 40 MG tablet Take 40 mg daily by mouth. (0800)   Yes [provider]  gabapentin (NEURONTIN) 300 MG capsule Take 300 mg by mouth 2 (two) times daily.  Yes [provider]  glipiZIDE (GLUCOTROL) 5 MG tablet Take 5 mg 2 (two) times daily before a meal by mouth. (0800&2000)   Yes [provider]  guaifenesin (ROBITUSSIN) 100 MG/5ML syrup Take 200 mg every 6 (six) hours as needed by mouth for cough.   Yes [provider]  hydrALAZINE (APRESOLINE) 25 MG tablet Take 25 mg by mouth 3 (three) times daily. *HOLD MORNING DOSE ON MONDAYS, WEDNESDAYS, AND FRIDAYS   Yes [provider]  insulin glargine (LANTUS) 100 unit/mL SOPN Inject 25-55 Units into the skin See admin instructions. 55units in the morning and 25 units in the evening   Yes [provider]  insulin lispro (HUMALOG) 100 UNIT/ML KiwkPen Inject 10 Units 3 (three) times daily before meals into the skin.   Yes [provider]  insulin regular (NOVOLIN R,HUMULIN R) 100 units/mL injection Inject 15 Units into the skin daily as needed for high blood sugar (for blood sugar levels over 400).   Yes [provider]  lidocaine-prilocaine (EMLA) cream Apply 1 application topically See admin instructions. Apply to access site 1 hour prior to dialysis on MONDAYS, [facility], and FRIDAYS   Yes [provider]  loperamide (IMODIUM A-D) 2 MG tablet Take 2 mg 4 (four) times daily as needed by mouth for diarrhea or loose stools (up to 8 doses in 24 hrs.).   Yes [provider]  Melatonin 5 MG CAPS Take 10 mg by mouth at bedtime.    Yes [provider]  pantoprazole (PROTONIX) 40 MG tablet Take 40 mg daily at 6 (six) AM by mouth.    Yes [provider]  polyvinyl alcohol (LIQUIFILM TEARS) 1.4 % ophthalmic solution Place 1 drop 5 (five) times daily as needed into both eyes for dry eyes.   Yes [provider]  sertraline (ZOLOFT) 50 MG tablet Take 75 mg by mouth daily.    Yes [provider]  sevelamer carbonate (RENVELA) 800 MG tablet Take 1,600-2,400 mg by mouth See admin instructions. 3 tablets with meals and 2 tablets with snacks   Yes [provider]    Review of Systems:  Negative except as otherwise mentioned in HPI.  Physical Exam: Vitals:   11/09/18 1300 11/09/18 1330 11/09/18 1545 11/09/18 1700  BP: (!) 152/66 (!) 167/61 (!) 135/53 (!) 160/56  Pulse: 65 66 69 75  Resp:   18 16  Temp:    98.2 F (36.8 C)  TempSrc:    Oral  SpO2: 94% 94% 97% 99%  Weight:    98.4 kg  Height:    _0  (1.575 m)   Constitutional: NAD, calm, comfortable; denies chest pain or shortness of breath.  Patient expressed feeling weak and tired. Eyes: PERRL, lids and conjunctivae normal ENMT: Mucous membranes are moist. Posterior pharynx clear of any exudate or lesions.Normal dentition.  Neck: normal, supple, no masses, no thyromegaly Respiratory: clear to  auscultation bilaterally, no wheezing, no crackles. Normal respiratory effort. No accessory muscle use.  Cardiovascular: Regular rate and rhythm, positive systolic ejection murmur, no rubs, no gallops, no JVD on exam.   Abdomen: no tenderness, no masses palpated. No hepatosplenomegaly. Bowel sounds positive.  Musculoskeletal: no clubbing / cyanosis. No joint deformity upper and lower extremities. Good ROM, no contractures. Normal muscle tone.  Trace edema appreciated bilaterally. Skin: no rashes, lesions, ulcers. No induration Neurologic: CN 2-12 grossly intact. Sensation intact, DTR normal. Strength 5/5 in all 4.  Psychiatric: Normal judgment and insight. Alert and  oriented x 3. Normal mood.    Labs on Admission: I have personally reviewed the following labs and imaging studies  CBC: Recent Labs  Lab 11/09/18 1004  WBC 5.5  HGB 7.9*  HCT 25.1*  MCV 102.9*  PLT 425   Basic Metabolic Panel: Recent Labs  Lab 11/09/18 1004  NA 134*  K 4.1  CL 95*  CO2 28  GLUCOSE 148*  BUN 34*  CREATININE 5.35*  CALCIUM 8.7*   GFR: Estimated Creatinine Clearance: 11 mL/min (A) (by C-G formula based on SCr of 5.35 mg/dL (H)).   Liver Function Tests: Recent Labs  Lab 11/09/18 1004  AST 21  ALT 17  ALKPHOS 94  BILITOT 0.6  PROT 7.2  ALBUMIN 3.8   CBG: Recent Labs  Lab 11/09/18 1708  GLUCAP 153*   Radiological Exams on Admission: No results found.  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  Given patient history of end-stage renal disease there is also a component of anemia from renal failure. -Patient will be transfused 2 units of PRBCs -Follow hemoglobin trend -GI service has been consulted and will follow recommendations regarding evaluation; possible endoscopy versus capsule endoscopy. -N.p.o.  after midnight -Protonix twice daily orally provided.  2-end-stage renal disease -Nephrology service has been consulted -Plan is to resume hemodialysis on her usual days (Monday Wednesday Friday -No need for acute hemodialysis at this time. -IV iron and Epogen therapy as per renal service discretion.  3-Type 2 diabetes with nephropathy ([place]) -Patient will be started on a sliding scale insulin and will continue the use of Lantus -Hold oral hypoglycemic agents -Check A1c.  4-Essential hypertension -Blood pressure stable -Will continue home antihypertensive regimen.  5-Anxiety and depression -No suicidal ideation or hallucinations -Mood stable -Continue home antidepressant and anxiolytic therapy.  6-Secondary hyperparathyroidism of renal origin (HCC) -Continue Renvela  7-Diabetic neuropathy ([facility]) -Continue gabapentin   DVT prophylaxis: SCDs Code Status: Full code Family Communication: No family at bedside Disposition Plan: Anticipate discharge back to assisted living facility once work-up for underlying GI bleed completed and patient's Hgb/symptomatic anemia symptoms stabilized. Consults called: Nephrology service, gastroenterology service (Dr. Laural Golden). Admission status: Observation, MedSurg, length of stay less than 2 midnights.   Time Spent: 60 minutes  Barton Dubois MD [facility] Pager [phone]   11/09/2018, 5:21 PM

## 2018-11-09 NOTE — ED Triage Notes (Signed)
Pt sent over by Dr. Olevia Perches office for hemoglobin of 6.9 and evaluation for black stools and generalized weakness. Denies abdominal pain. Pt had a blood transfusion on 2/27.

## 2018-11-09 NOTE — Telephone Encounter (Signed)
I talked with Tanya Lucero in the ED. Saw [name] 2 days ago. Got her labs from [facility] and her hemoglobin 7.3. I told Tanya Lucero she needed to be evaluated by the ED physician.

## 2018-11-09 NOTE — ED Notes (Signed)
ED TO INPATIENT HANDOFF REPORT  ED Nurse Name and Phone #: Judson Roch [phone]  S Name/Age/Gender Tanya Lucero 68 y.o. female Room/Bed: APA08/APA08  Code Status   Code Status: Full Code  Home/SNF/Other Skilled nursing facility Patient oriented to: self, place, time and situation Is this baseline? Yes   Triage Complete: Triage complete  Chief Complaint  SENT BY [facility]  Triage Note Pt sent over by Dr. Olevia Perches office for hemoglobin of 6.9 and evaluation for black stools and generalized weakness. Denies abdominal pain. Pt had a blood transfusion on 2/27.   Allergies Allergies  Allergen Reactions  . Penicillins Rash    Has patient had a PCN reaction causing immediate rash, facial/tongue/throat swelling, SOB or lightheadedness with hypotension: Unknown Has patient had a PCN reaction causing severe rash involving mucus membranes or skin necrosis: Unknown Has patient had a PCN reaction that required hospitalization: Unknown Has patient had a PCN reaction occurring within the last 10 years: Unknown If all of the above answers are "NO", then may proceed with Cephalosporin use.     Level of Care/Admitting Diagnosis ED Disposition    ED Disposition Condition [facility] Area: [facility] [[id number]]  Level of Care: Med-Surg [16]  Diagnosis: Symptomatic anemia [[id number]]  Admitting Physician: Barton Dubois [[id number]]  Attending Physician: Barton Dubois [[id number]]  PT Class (Do Not Modify): Observation [104]  PT Acc Code (Do Not Modify): Observation [10022]       B Medical/Surgery History Past Medical History:  Diagnosis Date  . Anemia   . Anxiety   . Arthritis   . Chronic kidney disease    neuphrotic syndrome-05/2013  . Constipation   . Decreased vision    left eye  . Depression   . Diabetes mellitus    Type 2  . Diabetic neuropathy ([place])   . Diverticulitis   . Dry skin   . GERD (gastroesophageal reflux disease)   . GI bleed 07/05/2017  . Gout    . Headache   . Hypertension   . Neuropathy   . Shortness of breath dyspnea   . Skin cancer    Past Surgical History:  Procedure Laterality Date  . ABDOMINAL HYSTERECTOMY    . ADENOIDECTOMY    . APPENDECTOMY     taken out with hysterectomy  . AV FISTULA PLACEMENT Right 09/13/2014   Procedure: Creation of Right Arm RADIOCEPHALIC ARTERIOVENOUS (AV) FISTULA ;  Surgeon: [name], MD;  Location: [facility];  Service: Vascular;  Laterality: Right;  . BACK SURGERY     lumbar -   . CATARACT EXTRACTION W/PHACO Left 07/02/2013   Procedure: CATARACT EXTRACTION PHACO AND INTRAOCULAR LENS PLACEMENT (IOC);  Surgeon: [name], MD;  [facility];  Service: Ophthalmology;  Laterality: Left;  CDE:  6.46  . CATARACT EXTRACTION W/PHACO Right 07/12/2013   Procedure: CATARACT EXTRACTION PHACO AND INTRAOCULAR LENS PLACEMENT (IOC);  Surgeon: [name], MD;  [facility];  Service: Ophthalmology;  Laterality: Right;  CDE:12.48  . COLONOSCOPY N/A 12/11/2015   Procedure: COLONOSCOPY;  Surgeon: [name], MD;  [facility];  Service: Endoscopy;  Laterality: N/A;  1:55  . ESOPHAGOGASTRODUODENOSCOPY N/A 03/11/2016   Procedure: ESOPHAGOGASTRODUODENOSCOPY (EGD);  Surgeon: [name], MD;  [facility];  Service: Endoscopy;  Laterality: N/A;  1230 - Last pt of the day-Dr. Laural Golden going to office to see pt's  . ESOPHAGOGASTRODUODENOSCOPY N/A 07/21/2017   Procedure: ESOPHAGOGASTRODUODENOSCOPY (EGD);  Surgeon: [name],  MD;  [facility];  Service: Endoscopy;  Laterality: N/A;  3:00  . FOOT SURGERY Right    x2-heel spur   . GIVENS CAPSULE STUDY N/A 11/09/2016   Procedure: GIVENS CAPSULE STUDY;  Surgeon: [name], MD;  [facility];  Service: Endoscopy;  Laterality: N/A;  7:30  . REVISON OF ARTERIOVENOUS FISTULA Right 01/05/2016   Procedure: REVISON OF ARTERIOVENOUS FISTULA- right forearm;  Surgeon: [name], MD;  Location: [place];  Service: Vascular;   Laterality: Right;  . SKIN CANCER EXCISION     forehead  . TONSILLECTOMY       A IV Location/Drains/Wounds Patient Lines/Drains/Airways Status   Active Line/Drains/Airways    Name:   Placement date:   Placement time:   Site:   Days:   Peripheral IV 11/09/18 Left Antecubital   11/09/18    1104    Antecubital   less than 1   Peripheral IV 11/09/18 Left Hand   11/09/18    1551    Hand   less than 1   Fistula / Graft Right Forearm Arteriovenous fistula   09/13/14    0810    Forearm   1518          Intake/Output Last 24 hours  Intake/Output Summary (Last 24 hours) at 11/09/2018 1608 Last data filed at 11/09/2018 1504 Gross per 24 hour  Intake 24.6 ml  Output -  Net 24.6 ml    Labs/Imaging Results for orders placed or performed during the hospital encounter of 11/09/18 (from the past 48 hour(s))  Comprehensive metabolic panel     Status: Abnormal   Collection Time: 11/09/18 10:04 AM  Result Value Ref Range   Sodium 134 (L) 135 - 145 mmol/L   Potassium 4.1 3.5 - 5.1 mmol/L   Chloride 95 (L) 98 - 111 mmol/L   CO2 28 22 - 32 mmol/L   Glucose, Bld 148 (H) 70 - 99 mg/dL   BUN 34 (H) 8 - 23 mg/dL   Creatinine, Ser 5.35 (H) 0.44 - 1.00 mg/dL   Calcium 8.7 (L) 8.9 - 10.3 mg/dL   Total Protein 7.2 6.5 - 8.1 g/dL   Albumin 3.8 3.5 - 5.0 g/dL   AST 21 15 - 41 U/L   ALT 17 0 - 44 U/L   Alkaline Phosphatase 94 38 - 126 U/L   Total Bilirubin 0.6 0.3 - 1.2 mg/dL   GFR calc non Af Amer 8 (L) >60 mL/min   GFR calc Af Amer 9 (L) >60 mL/min   Anion gap 11 5 - 15    Comment: Performed at [facility], [address] [place], [place] [address]  CBC     Status: Abnormal   Collection Time: 11/09/18 10:04 AM  Result Value Ref Range   WBC 5.5 4.0 - 10.5 K/uL   RBC 2.44 (L) 3.87 - 5.11 MIL/uL   Hemoglobin 7.9 (L) 12.0 - 15.0 g/dL   HCT 25.1 (L) 36.0 - 46.0 %   MCV 102.9 (H) 80.0 - 100.0 fL   MCH 32.4 26.0 - 34.0 pg   MCHC 31.5 30.0 - 36.0 g/dL   RDW 18.9 (H) 11.5 - 15.5 %   Platelets 167  150 - 400 K/uL   nRBC 0.0 0.0 - 0.2 %    Comment: Performed at [facility], [address], [place], [place] [address]  Type and screen [facility]     Status: None   Collection Time: 11/09/18 11:04  AM  Result Value Ref Range   ABO/RH(D) O POS    Antibody Screen NEG    Sample Expiration      11/12/2018 Performed at [facility], [address], [place], [place] [address]   Prepare RBC     Status: None   Collection Time: 11/09/18 11:04 AM  Result Value Ref Range   Order Confirmation      ORDER PROCESSED BY BLOOD BANK Performed at [facility], [address] [place], [place] [address]    No results found.  Pending Labs Unresulted Labs (From admission, onward)    Start     Ordered   11/10/18 0500  CBC  Tomorrow morning,   R     11/09/18 1130   11/09/18 1421  Hemoglobin A1c  Once,   R     11/09/18 1431   11/09/18 1418  HIV antibody (Routine Testing)  Once,   R     11/09/18 1431          Vitals/Pain Today's Vitals   11/09/18 1230 11/09/18 1300 11/09/18 1330 11/09/18 1545  BP: 136/60 (!) 152/66 (!) 167/61 (!) 135/53  Pulse: 66 65 66 69  Resp:    18  Temp:      TempSrc:      SpO2: 95% 94% 94% 97%  Weight:      Height:      PainSc:   0-No pain     Isolation Precautions No active isolations  Medications Medications  pantoprazole (PROTONIX) EC tablet 40 mg (40 mg Oral Given 11/09/18 1248)  acetaminophen (TYLENOL) tablet 1,000 mg (has no administration in time range)  carvedilol (COREG) tablet 6.25 mg (has no administration in time range)  amLODipine (NORVASC) tablet 5 mg (has no administration in time range)  hydrALAZINE (APRESOLINE) tablet 25 mg (25 mg Oral Not Given 11/09/18 1606)  ALPRAZolam (XANAX) tablet 0.25 mg (has no administration in time range)  buPROPion (WELLBUTRIN) tablet 75 mg (has no administration in time range)  sertraline (ZOLOFT) tablet 75 mg (75 mg Oral Not Given 11/09/18 1547)  sevelamer carbonate (RENVELA) tablet 2,400 mg (has no  administration in time range)  alum & mag hydroxide-simeth (MAALOX/MYLANTA) 200-200-20 MG/5ML suspension 30 mL (has no administration in time range)  gabapentin (NEURONTIN) capsule 600 mg (has no administration in time range)  guaiFENesin (ROBITUSSIN) 100 MG/5ML solution 200 mg (has no administration in time range)  polyvinyl alcohol (LIQUIFILM TEARS) 1.4 % ophthalmic solution 1 drop (has no administration in time range)  cholecalciferol (VITAMIN D3) tablet 1,000 Units (has no administration in time range)  ondansetron (ZOFRAN) tablet 4 mg (has no administration in time range)    Or  ondansetron (ZOFRAN) injection 4 mg (has no administration in time range)  sodium chloride flush (NS) 0.9 % injection 3 mL (has no administration in time range)  sodium chloride flush (NS) 0.9 % injection 3 mL (has no administration in time range)  0.9 %  sodium chloride infusion (has no administration in time range)  insulin aspart (novoLOG) injection 0-9 Units (has no administration in time range)  sevelamer carbonate (RENVELA) tablet 1,600 mg (has no administration in time range)  insulin glargine (LANTUS) injection 30 Units (has no administration in time range)  0.9 %  sodium chloride infusion (0 mL/hr Intravenous Stopped 11/09/18 1407)    Mobility walks with device Low fall risk   Focused Assessments   R Recommendations: See Admitting Provider Note  Report given to:   Additional Notes:

## 2018-11-09 NOTE — ED Notes (Signed)
Occult blood card at bedside.  

## 2018-11-09 NOTE — Telephone Encounter (Signed)
On 11/07/18 patient had lab work drawn while at [facility]. Her H&H resulted at 6.9g/dL. Patient was seen in our office on 11/06/2018, labs were ordered and hemoccult cards and instruction was given.  Previously, she was sent to ED at [facility], this was 02/26-02/27. She was given one unit of PRBC, her Hemoglobin was 7.4 g/dL prior to this infusion.  Deberah Castle ,NP has recommended that the patient go to the ED for evaluation and transfusion. Per Terri she has called and talked with the ED physician.  The patient's sister , Gae Bon has been made aware . Transport, [place] ,from [place] to have her at [facility] at 10 am.

## 2018-11-09 NOTE — ED Notes (Signed)
Report given to 300 RN at this time.  

## 2018-11-10 ENCOUNTER — Encounter (HOSPITAL_COMMUNITY): Payer: Self-pay | Admitting: Internal Medicine

## 2018-11-10 ENCOUNTER — Encounter (HOSPITAL_COMMUNITY)
Admission: EM | Disposition: A | Discharge status: Home / Self Care | Payer: Self-pay | Source: Home / Self Care | Attending: Emergency Medicine

## 2018-11-10 DIAGNOSIS — E1121 Type 2 diabetes mellitus with diabetic nephropathy: Secondary | ICD-10-CM

## 2018-11-10 DIAGNOSIS — D649 Anemia, unspecified: Secondary | ICD-10-CM | POA: Diagnosis not present

## 2018-11-10 DIAGNOSIS — D631 Anemia in chronic kidney disease: Secondary | ICD-10-CM

## 2018-11-10 DIAGNOSIS — N186 End stage renal disease: Secondary | ICD-10-CM | POA: Diagnosis not present

## 2018-11-10 DIAGNOSIS — E1142 Type 2 diabetes mellitus with diabetic polyneuropathy: Secondary | ICD-10-CM | POA: Diagnosis not present

## 2018-11-10 DIAGNOSIS — D500 Iron deficiency anemia secondary to blood loss (chronic): Secondary | ICD-10-CM | POA: Diagnosis not present

## 2018-11-10 DIAGNOSIS — K922 Gastrointestinal hemorrhage, unspecified: Secondary | ICD-10-CM | POA: Diagnosis not present

## 2018-11-10 DIAGNOSIS — F419 Anxiety disorder, unspecified: Secondary | ICD-10-CM | POA: Diagnosis not present

## 2018-11-10 HISTORY — PX: GIVENS CAPSULE STUDY: SHX5432

## 2018-11-10 LAB — CBC
HEMATOCRIT: 29.5 % — AB (ref 36.0–46.0)
HEMOGLOBIN: 9.6 g/dL — AB (ref 12.0–15.0)
MCH: 30.9 pg (ref 26.0–34.0)
MCHC: 32.5 g/dL (ref 30.0–36.0)
MCV: 94.9 fL (ref 80.0–100.0)
Platelets: 143 10*3/uL — ABNORMAL LOW (ref 150–400)
RBC: 3.11 MIL/uL — ABNORMAL LOW (ref 3.87–5.11)
RDW: 22.2 % — ABNORMAL HIGH (ref 11.5–15.5)
WBC: 4.2 10*3/uL (ref 4.0–10.5)
nRBC: 0 % (ref 0.0–0.2)

## 2018-11-10 LAB — GLUCOSE, CAPILLARY
Glucose-Capillary: 68 mg/dL — ABNORMAL LOW (ref 70–99)
Glucose-Capillary: 77 mg/dL (ref 70–99)
Glucose-Capillary: 72 mg/dL (ref 70–99)

## 2018-11-10 LAB — IRON AND TIBC
Iron: 73 ug/dL (ref 28–170)
SATURATION RATIOS: 25 % (ref 10.4–31.8)
TIBC: 296 ug/dL (ref 250–450)
UIBC: 223 ug/dL

## 2018-11-10 LAB — TSH: TSH: 2.07 u[IU]/mL (ref 0.350–4.500)

## 2018-11-10 LAB — FERRITIN: Ferritin: 402 ng/mL — ABNORMAL HIGH (ref 11–307)

## 2018-11-10 LAB — HIV ANTIBODY (ROUTINE TESTING W REFLEX): HIV Screen 4th Generation wRfx: NONREACTIVE

## 2018-11-10 LAB — VITAMIN B12: Vitamin B-12: 550 pg/mL (ref 180–914)

## 2018-11-10 SURGERY — IMAGING PROCEDURE, GI TRACT, INTRALUMINAL, VIA CAPSULE

## 2018-11-10 MED ORDER — PENTAFLUOROPROP-TETRAFLUOROETH EX AERO: 1.0000 "application " | INHALATION_SPRAY | CUTANEOUS | Status: DC | PRN

## 2018-11-10 MED ORDER — SODIUM CHLORIDE 0.9 % IV SOLN: 100.0000 mL | INTRAVENOUS | Status: DC | PRN

## 2018-11-10 MED ORDER — PANTOPRAZOLE SODIUM 40 MG PO TBEC: 40.0000 mg | DELAYED_RELEASE_TABLET | Freq: Two times a day (BID) | ORAL | Status: AC

## 2018-11-10 MED ORDER — LIDOCAINE-PRILOCAINE 2.5-2.5 % EX CREA: 1.0000 "application " | TOPICAL_CREAM | CUTANEOUS | Status: DC | PRN

## 2018-11-10 MED ORDER — EPOETIN ALFA 10000 UNIT/ML IJ SOLN
10000.0000 [IU] | Freq: Once | INTRAMUSCULAR | Status: AC
  Administered 2018-11-10: 10000 [IU] via INTRAVENOUS
  Filled 2018-11-10: qty 1

## 2018-11-10 MED ORDER — DARBEPOETIN ALFA 200 MCG/0.4ML IJ SOSY: 200.0000 ug | PREFILLED_SYRINGE | INTRAMUSCULAR | Status: DC

## 2018-11-10 MED ORDER — LIDOCAINE HCL (PF) 1 % IJ SOLN: 5.0000 mL | INTRAMUSCULAR | Status: DC | PRN

## 2018-11-10 MED ORDER — CHLORHEXIDINE GLUCONATE CLOTH 2 % EX PADS: 6.0000 | MEDICATED_PAD | Freq: Every day | CUTANEOUS | Status: DC

## 2018-11-10 MED ORDER — GABAPENTIN 300 MG PO CAPS: 300.0000 mg | ORAL_CAPSULE | Freq: Every day | ORAL | Status: DC

## 2018-11-10 NOTE — Progress Notes (Signed)
  Subjective:  Patient has no complaints.  She states she had one bowel movement during the night but forgot to tell the nursing staff.  Therefore Hemoccult was not done.  She denies nausea vomiting or abdominal pain.  She is hungry.  Objective: Blood pressure (!) 161/65, pulse 67, temperature 98.1 F (36.7 C), temperature source Oral, resp. rate 16, height '5\' 2"'$  (1.575 m), weight 98.9 kg, SpO2 98 %. Patient is alert and in no acute distress.   Labs/studies Results:  CBC Latest Ref Rng & Units 11/10/2018 11/09/2018 11/01/2018  WBC 4.0 - 10.5 K/uL 4.2 5.5 6.0  Hemoglobin 12.0 - 15.0 g/dL 9.6(L) 7.9(L) 7.4(L)  Hematocrit 36.0 - 46.0 % 29.5(L) 25.1(L) 23.7(L)  Platelets 150 - 400 K/uL 143(L) 167 180    CMP Latest Ref Rng & Units 11/09/2018 11/01/2018 04/03/2018  Glucose 70 - 99 mg/dL 148(H) 74 176(H)  BUN 8 - 23 mg/dL 34(H) 17 26(H)  Creatinine 0.44 - 1.00 mg/dL 5.35(H) 3.43(H) 4.68(H)  Sodium 135 - 145 mmol/L 134(L) 136 138  Potassium 3.5 - 5.1 mmol/L 4.1 3.3(L) 3.5  Chloride 98 - 111 mmol/L 95(L) 98 100  CO2 22 - 32 mmol/L '28 28 28  '$ Calcium 8.9 - 10.3 mg/dL 8.7(L) 8.4(L) 8.6(L)  Total Protein 6.5 - 8.1 g/dL 7.2 - -  Total Bilirubin 0.3 - 1.2 mg/dL 0.6 - -  Alkaline Phos 38 - 126 U/L 94 - -  AST 15 - 41 U/L 21 - -  ALT 0 - 44 U/L 17 - -    Hepatic Function Latest Ref Rng & Units 11/09/2018  Total Protein 6.5 - 8.1 g/dL 7.2  Albumin 3.5 - 5.0 g/dL 3.8  AST 15 - 41 U/L 21  ALT 0 - 44 U/L 17  Alk Phosphatase 38 - 126 U/L 94  Total Bilirubin 0.3 - 1.2 mg/dL 0.6     Serum B12 550 pg/mL Serum iron 73, TIBC 296 and saturation 25% Serum ferritin 402.  Assessment:  #1.  Anemia.  Acute on chronic anemia.  Patient has received 2 units of PRBCs and her hemoglobin is up to 9.6.  Her main symptom has been 1 of weakness and tiredness.  She has not had melena or rectal bleeding since admitted yesterday.  She only had one bowel movement.  She is therefore not having overt or significant bleed.   Iron studies are now normal.  Similarly B12 is normal. Unless she is documented to have active bleeding we may have to look for other causes such as bone marrow failure or hemolysis.  She is receiving Aranesp every Friday Since patient not actively bleeding I proceeded with small bowel given capsule. Bedside review of the study reveals no blood in her bowel.  Therefore she should be able to go home once hemodialysis completed.  Recommendations:   Patient stable for discharge once she has completed hemodialysis.  Discussed with Dr. Dyann Kief. I will contact patient with results of capsule study tomorrow.

## 2018-11-10 NOTE — Discharge Summary (Signed)
Physician Discharge Summary  Tanya Lucero [id number]:[id number] DOB: Oct 31, 1950 DOA: 11/09/2018  PCP: [name], DO  Admit date: 11/09/2018 Discharge date: 11/10/2018  Time spent: 35 minutes  Recommendations for Outpatient Follow-up:  1. Repeat CBC to follow hemoglobin trend 2. Reassess patient CBGs and if any hypoglycemic event detected consider decreasing hypoglycemic regimen, given the fact that her A1c is 4.3.   Discharge Diagnoses:  Principal Problem:   Symptomatic anemia Active Problems:   Type 2 diabetes with nephropathy (HCC)   Essential hypertension   Iron deficiency anemia due to chronic blood loss   ESRD (end stage renal disease) (HCC)   Anxiety and depression   Secondary hyperparathyroidism of renal origin ([place])   Diabetic neuropathy ([place])   Discharge Condition: Hemodynamically stable and without signs of life-threatening bleeding appreciated currently.  Hemoglobin at discharge 9.6.  Outpatient follow-up with PCP and gastroenterology service.  Resumption of her usual hemodialysis treatments next one 11/13/2018.  Diet recommendation: Renal diet/modified carbohydrate  Filed Weights   11/10/18 0609 11/10/18 0859 11/10/18 1420  Weight: 99.4 kg 98.9 kg 98.8 kg    History of present illness:  68 y.o. 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  She is actively followed by Dr. Laural Golden (gastroenterology service); and with upcoming plans of potential endoscopic procedures due to this ongoing chronic GI bleeding.  After reviewing her records she was seen around 11/01/2018 in the emergency department secondary to  symptomatic anemia as well and at that time receive 1 unit of PRBCs and was discharged home with instruction to follow-up as an outpatient.  Patient expressed that since then she has noticed intermittent episode of melanotic stool and has continue experiencing generalized weakness and fatigue. Patient denies chest pain, shortness of breath, nausea, vomiting, hematemesis, abdominal pain, focal weakness, headaches or any other complaints.  In the ED hemoglobin 7.9, positive fecal occult blood test and stable -hemodynamically stable and with stable electrolytes/renal function for her chronic condition.  GI service was consulted and [facility] contacted to place patient in observation for anticipated acute work-up for GI bleed.  Patient received type and screen and 2 units of PRBCs has been ordered.  Nephrology services was also consulted to resume hemodialysis treatment while hospitalized.  Hospital Course:  1-symptomatic anemia -With concern of chronic GI blood loss -Patient was fecal occult blood test positive -Received 2 units of PRBCs with hemoglobin 9.6 at discharge -Capsule endoscopy done during this hospitalization following GI recommendations; preliminary results demonstrated no signs of acute bleeding. -They feel patient can be discharged home with outpatient follow-up. -PPI has been adjusted to twice a day -Patient will continue IV iron and Epogen therapy as per renal discretion. -Anemia panel corroborated component of anemia of chronic disease with a ferritin of 402; B12 550 and iron level 73 with TIBC of 296.  2-end-stage renal disease/anemia of chronic disease -Patient receive hemodialysis on 11/10/2018 as an inpatient -Electrolytes were stable -Continue outpatient hemodialysis (next treatment 11/13/2018) -Continue as mentioned above IV iron and Epogen therapy as per renal service discretion for underlying anemia of chronic kidney disease.  3-gastroesophageal reflux disease -Continue  PPI -Dose adjusted to twice a day for better control of symptoms/protection in the setting of chronic GI bleed.  4-type 2 diabetes apathy -Resume home hypoglycemic regimen; her A1c was 4.3; and will be amenable to assess for reduction in her insulin therapy, especially if there is episodes of hypoglycemia.   5-depression -No suicidal ideation or hallucination -Stable mood -Continue home antidepressant and anxiolytic therapy.  6-essential hypertension -Continue current antihypertensive regimen -Low-sodium/renal diet recommended at discharge.  7-secondary hypothyroidism of renal origin -Continue Renvela  8-diabetic neuropathy -Continue gabapentin  9-morbid obesity -Low calorie diet and portion control has been discussed with patient. -Body mass index is 39.84 kg/m.   Procedures:  Capsule endoscopy: 11/10/2018; preliminary report by GI service demonstrating no signs of acute bleeding.  Consultations:  Gastroenterology  Nephrology  Discharge Exam: Vitals:   11/10/18 1545 11/10/18 1600  BP: (!) 112/56 (!) 127/55  Pulse: 63 67  Resp:    Temp:    SpO2:      General: Afebrile, no chest pain, no shortness of breath, no nausea, no vomiting, no abdominal pain.  Patient reports feeling tired.  Denies palpitations. Cardiovascular: S1 and S2, no rubs, no gallops; positive systolic ejection murmur. Respiratory: Good air movement bilaterally, normal respiratory effort. Abdomen: Soft, nontender, nondistended, positive bowel sounds Extremities: Trace edema bilaterally, no cyanosis, no clubbing.  Discharge Instructions   Discharge Instructions    Discharge instructions   Complete by:  As directed    Follow modified carbohydrate/renal diet Continue to be compliant with your dialysis as an outpatient Maintain adequate hydration Take medications as prescribed (Protonix has been changed to twice a day) Arrange follow-up with PCP in 2 weeks Follow-up with gastroenterology  service as instructed.     Allergies as of 11/10/2018      Reactions   Penicillins Rash   Has patient had a PCN reaction causing immediate rash, facial/tongue/throat swelling, SOB or lightheadedness with hypotension: Unknown Has patient had a PCN reaction causing severe rash involving mucus membranes or skin necrosis: Unknown Has patient had a PCN reaction that required hospitalization: Unknown Has patient had a PCN reaction occurring within the last 10 years: Unknown If all of the above answers are "NO", then may proceed with Cephalosporin use.      Medication List    TAKE these medications   acetaminophen 500 MG tablet Commonly known as:  TYLENOL Take 1,000 mg every 6 (six) hours as needed by mouth (FOR HEADACHES/MINOR DISCOMFORT).   ALPRAZolam 0.25 MG tablet Commonly known as:  XANAX Take 0.25 mg by mouth 2 (two) times daily.   aluminum-magnesium hydroxide-simethicone 154-008-67 MG/5ML Susp Commonly known as:  MAALOX Take 30 mLs by mouth 4 (four) times daily as needed (heartburn/indigestion).   amLODipine 5 MG tablet Commonly known as:  NORVASC Take 5 mg by mouth at bedtime. (0800)   anti-nausea solution Take 30 mLs every 15 (fifteen) minutes as needed by mouth for nausea or vomiting (X4 doses only).   b complex vitamins capsule Take 1 capsule daily by mouth. (0800)   B-COMPLEX PO Take 1 tablet by mouth every morning.   buPROPion 75 MG tablet Commonly known as:  WELLBUTRIN Take 75 mg daily by mouth. (0800)   carvedilol 6.25 MG tablet Commonly known as:  COREG Take 6.25 mg 2 (two) times daily with a meal by mouth. (0800&2000)   cholecalciferol 1000 units tablet Commonly known as:  VITAMIN D Take 1,000 Units by mouth daily.   fexofenadine 180 MG tablet Commonly known as:  ALLEGRA Take 180 mg daily by mouth. (0800)   furosemide 40 MG tablet Commonly known as:  LASIX Take 40 mg daily by mouth. (0800) What changed:  Another medication with the same name was  removed. Continue taking this medication, and follow the directions you see here.   gabapentin 300 MG capsule Commonly known as:  NEURONTIN Take 300 mg by mouth 2 (two) times daily.   glipiZIDE 5 MG tablet Commonly known as:  GLUCOTROL Take 5 mg 2 (two) times daily before a meal by mouth. (0800&2000)   guaifenesin 100 MG/5ML syrup Commonly known as:  ROBITUSSIN Take 200 mg every 6 (six) hours as needed by mouth for cough.   hydrALAZINE 25 MG tablet Commonly known as:  APRESOLINE Take 25 mg by mouth 3 (three) times daily. *HOLD MORNING DOSE ON MONDAYS, WEDNESDAYS, AND FRIDAYS   insulin glargine 100 unit/mL Sopn Commonly known as:  LANTUS Inject 25-55 Units into the skin See admin instructions. 55units in the morning and 25 units in the evening   insulin lispro 100 UNIT/ML KiwkPen Commonly known as:  HUMALOG Inject 10 Units 3 (three) times daily before meals into the skin.   insulin regular 100 units/mL injection Commonly known as:  NOVOLIN R,HUMULIN R Inject 15 Units into the skin daily as needed for high blood sugar (for blood sugar levels over 400).   lidocaine-prilocaine cream Commonly known as:  EMLA Apply 1 application topically See admin instructions. Apply to access site 1 hour prior to dialysis on MONDAYS, WEDNESDAYS, and FRIDAYS   loperamide 2 MG tablet Commonly known as:  IMODIUM A-D Take 2 mg 4 (four) times daily as needed by mouth for diarrhea or loose stools (up to 8 doses in 24 hrs.).   Melatonin 5 MG Caps Take 10 mg by mouth at bedtime.   pantoprazole 40 MG tablet Commonly known as:  PROTONIX Take 1 tablet (40 mg total) by mouth 2 (two) times daily. What changed:  when to take this   polyvinyl alcohol 1.4 % ophthalmic solution Commonly known as:  LIQUIFILM TEARS Place 1 drop 5 (five) times daily as needed into both eyes for dry eyes.   sertraline 50 MG tablet Commonly known as:  ZOLOFT Take 75 mg by mouth daily.   sevelamer carbonate 800 MG  tablet Commonly known as:  RENVELA Take 1,600-2,400 mg by mouth See admin instructions. 3 tablets with meals and 2 tablets with snacks      Allergies  Allergen Reactions  . Penicillins Rash    Has patient had a PCN reaction causing immediate rash, facial/tongue/throat swelling, SOB or lightheadedness with hypotension: Unknown Has patient had a PCN reaction causing severe rash involving mucus membranes or skin necrosis: Unknown Has patient had a PCN reaction that required hospitalization: Unknown Has patient had a PCN reaction occurring within the last 10 years: Unknown If all of the above answers are "NO", then may proceed with Cephalosporin use.    Follow-up Information    [name], DO. Schedule an appointment as soon as possible for a visit in 2 week(s).   Contact information: [address] [place] [address] [phone]           The results of significant diagnostics from this hospitalization (including imaging, microbiology, ancillary and laboratory) are listed below for reference.    Labs: Basic Metabolic Panel: Recent Labs  Lab 11/09/18 1004  NA 134*  K 4.1  CL 95*  CO2 28  GLUCOSE 148*  BUN 34*  CREATININE 5.35*  CALCIUM 8.7*   Liver Function Tests: Recent Labs  Lab 11/09/18 1004  AST 21  ALT 17  ALKPHOS 94  BILITOT 0.6  PROT 7.2  ALBUMIN 3.8   CBC: Recent Labs  Lab 11/09/18 1004 11/10/18 0742  WBC 5.5 4.2  HGB 7.9* 9.6*  HCT 25.1* 29.5*  MCV 102.9* 94.9  PLT 167 143*   CBG: Recent Labs  Lab 11/09/18 1708 11/09/18 2122 11/10/18 0822 11/10/18 1130  GLUCAP 153* 113* 68* 77    Signed:  Barton Dubois MD.  [facility] 11/10/2018, 4:08 PM

## 2018-11-10 NOTE — NC FL2 (Signed)
[place] LEVEL OF CARE SCREENING TOOL     IDENTIFICATION  Patient Name: Tanya Lucero Birthdate: 02-26-1951 Sex: female Admission Date (Current Location): 11/09/2018  [place] and [place] Number:  [facility] [id number] [place] and Address:  [facility] [address] [place]      Provider Number: [id number]  Attending Physician Name and Address:  Barton Dubois, MD  Relative Name and Phone Number:       Current Level of Care: Hospital Recommended Level of Care: [place] Prior Approval Number:    Date Approved/Denied:   PASRR Number:    Discharge Plan: Domiciliary (Rest home)    Current Diagnoses: Patient Active Problem List   Diagnosis Date Noted  . Symptomatic anemia 11/09/2018  . ESRD (end stage renal disease) ([place]) 11/09/2018  . Anxiety and depression 11/09/2018  . Secondary hyperparathyroidism of renal origin ([place]) 11/09/2018  . Diabetic neuropathy ([place]) 11/09/2018  . Guaiac positive stools 07/19/2017  . GI bleed 07/05/2017  . Iron deficiency anemia due to chronic blood loss 10/27/2016  . RENAL INSUFFICIENCY 03/04/2010  . Type 2 diabetes with nephropathy ([place]) 02/17/2010  . Essential hypertension 02/17/2010  . OTHER MALAISE AND FATIGUE 02/17/2010  . SHORTNESS OF BREATH 02/17/2010  . CHEST PAIN, PRECORDIAL 02/17/2010    Orientation RESPIRATION BLADDER Height & Weight     Self, Time, Situation  Normal Continent Weight: 98.8 kg Height:  5\' 2"  (157.5 cm)  BEHAVIORAL SYMPTOMS/MOOD NEUROLOGICAL BOWEL NUTRITION STATUS      Continent Diet(renal-carb modified)  AMBULATORY STATUS COMMUNICATION OF NEEDS Skin   Independent Verbally Normal                       Personal Care Assistance Level of Assistance  Bathing, Feeding, Dressing Bathing Assistance: Limited assistance Feeding assistance: Independent Dressing Assistance: Independent     Functional Limitations Info  Sight, Hearing, Speech Sight  Info: Adequate Hearing Info: Adequate Speech Info: Adequate    SPECIAL CARE FACTORS FREQUENCY                       Contractures Contractures Info: Not present    Additional Factors Info  Insulin Sliding Scale(see discahrge summary for Sliding Scale) Code Status Info: full Allergies Info: penicillins           Current Medications (11/10/2018):  This is the current hospital active medication list Current Facility-Administered Medications  Medication Dose Route Frequency Provider Last Rate Last Dose  . 0.9 %  sodium chloride infusion  250 mL Intravenous PRN Barton Dubois, MD      . 0.9 %  sodium chloride infusion  100 mL Intravenous PRN Edrick Oh, MD      . 0.9 %  sodium chloride infusion  100 mL Intravenous PRN Edrick Oh, MD      . acetaminophen (TYLENOL) tablet 1,000 mg  1,000 mg Oral Q6H PRN Barton Dubois, MD   1,000 mg at 11/10/18 0620  . ALPRAZolam Duanne Moron) tablet 0.25 mg  0.25 mg Oral BID Barton Dubois, MD   0.25 mg at 11/10/18 1421  . alum & mag hydroxide-simeth (MAALOX/MYLANTA) 200-200-20 MG/5ML suspension 30 mL  30 mL Oral QID PRN Barton Dubois, MD      . amLODipine (NORVASC) tablet 5 mg  5 mg Oral QHS Barton Dubois, MD   5 mg at 11/09/18 2159  . buPROPion [employer]) tablet 75 mg  75 mg Oral Daily Barton Dubois, MD      .  carvedilol (COREG) tablet 6.25 mg  6.25 mg Oral BID WC Barton Dubois, MD   6.25 mg at 11/09/18 1836  . Chlorhexidine Gluconate Cloth 2 % PADS 6 each  6 each Topical Q0600 Edrick Oh, MD      . cholecalciferol (VITAMIN D3) tablet 1,000 Units  1,000 Units Oral Daily Barton Dubois, MD   1,000 Units at 11/10/18 1421  . epoetin alfa (EPOGEN,PROCRIT) injection 10,000 Units  10,000 Units Intravenous Once Edrick Oh, MD      . gabapentin (NEURONTIN) capsule 300 mg  300 mg Oral QHS Edrick Oh, MD      . guaiFENesin (ROBITUSSIN) 100 MG/5ML solution 200 mg  200 mg Oral Q6H PRN Barton Dubois, MD      . hydrALAZINE (APRESOLINE) tablet 25 mg   25 mg Oral TID Barton Dubois, MD   25 mg at 11/10/18 1422  . insulin aspart (novoLOG) injection 0-9 Units  0-9 Units Subcutaneous TID WC Barton Dubois, MD   2 Units at 11/09/18 1837  . insulin glargine (LANTUS) injection 30 Units  30 Units Subcutaneous BID Barton Dubois, MD   30 Units at 11/09/18 2159  . lidocaine (PF) (XYLOCAINE) 1 % injection 5 mL  5 mL Intradermal PRN Edrick Oh, MD      . lidocaine-prilocaine (EMLA) cream 1 application  1 application Topical PRN Edrick Oh, MD      . ondansetron [facility]) tablet 4 mg  4 mg Oral Q6H PRN Barton Dubois, MD       Or  . ondansetron [facility]) injection 4 mg  4 mg Intravenous Q6H PRN Barton Dubois, MD      . pantoprazole (PROTONIX) EC tablet 40 mg  40 mg Oral BID Barton Dubois, MD   40 mg at 11/10/18 1422  . pentafluoroprop-tetrafluoroeth (GEBAUERS) aerosol 1 application  1 application Topical PRN Edrick Oh, MD      . polyvinyl alcohol (LIQUIFILM TEARS) 1.4 % ophthalmic solution 1 drop  1 drop Both Eyes 5 X Daily PRN Barton Dubois, MD      . sertraline (ZOLOFT) tablet 75 mg  75 mg Oral Daily Barton Dubois, MD   75 mg at 11/10/18 1422  . sevelamer carbonate (RENVELA) tablet 1,600 mg  1,600 mg Oral With snacks Barton Dubois, MD   1,600 mg at 11/10/18 1434  . sevelamer carbonate (RENVELA) tablet 2,400 mg  2,400 mg Oral TID WC Barton Dubois, MD   2,400 mg at 11/10/18 1420  . sodium chloride flush (NS) 0.9 % injection 3 mL  3 mL Intravenous Q12H Barton Dubois, MD   3 mL at 11/10/18 1434  . sodium chloride flush (NS) 0.9 % injection 3 mL  3 mL Intravenous PRN Barton Dubois, MD         Discharge Medications: Please see discharge summary for a list of discharge medications.  Relevant Imaging Results:  Relevant Lab Results:   Additional Information on dialysis  Sherald Barge, RN

## 2018-11-10 NOTE — Procedures (Signed)
     HEMODIALYSIS TREATMENT NOTE (Fri 6 Mar):  3.5 hour heparin-free dialysis session completed via right forearm AVF (15g/antegrade). Goal met: 2 liters removed without interruption in ultrafiltration, although rate was decreased twice in response to declining BP.  All blood was returned and hemostasis was achieved in 15 minutes.  Rockwell Alexandria, RN

## 2018-11-10 NOTE — Progress Notes (Signed)
DC instructions given to pt and granddaughter w/ med changes, activity level, diet, and issues to return to the ED. IV discontinued. No distress noted. All questions answered. Home via car w/ granddaughter - Romelle Starcher. Will continue to monitor.

## 2018-11-10 NOTE — Care Management Note (Addendum)
Case Management Note  Patient Details  Name: EMRIE GAYLE MRN: [id number] Date of Birth: [birth year]  Subjective/Objective:      Work up for GIB. Pt from [facility]. CM spoke with Sabrina at [facility]. Pt ind pta. Used a RW with ambulation. Alert and oriented, has a sister, Ramona, for support. Pt's work up complete this afternoon, still needs HD which will be completed later today.             Action/Plan: DC back to ALF tomorrow. Per Gabriel Cirri pt may return tomorrow. DC summary and FL2 may be sent tomorrow as well.   Expected Discharge Date:  11/13/18               Expected Discharge Plan:  Assisted Living / Rest Home  In-House Referral:  NA  Discharge planning Services  CM Consult  Post Acute Care Choice:    Choice offered to:     DME Arranged:    DME Agency:     HH Arranged:    HH Agency:     Status of Service:  In process, will continue to follow  If discussed at Long Length of Stay Meetings, dates discussed:    Additional Comments:   ADDENDUM: patient okay to DC back today. HD pushed forward to better accommodate DC. Facility aware of DC, will be provided Fl2 and DC summary. Facility will pick up patient. RN will need to call facility when pt ready.    Sherald Barge, RN 11/10/2018, 3:12 PM

## 2018-11-10 NOTE — Progress Notes (Signed)
We will proceed with small bowel given capsule study today.

## 2018-11-10 NOTE — Consult Note (Signed)
Referring Provider: No ref. provider found Primary Care Physician:  [name], DO Primary Nephrologist:  Dr. Lowanda Foster  Reason for Consultation: Medical management of end-stage renal disease, treatment of anemia, assessment and treatment of secondary hyperparathyroidism, maintenance of euvolemia  HPI: This is a 68 year old lady with a history of end-stage renal disease Monday Wednesday Friday dialysis treatments at [facility] dialysis facility.  She has been end-stage renal disease for about 4 years she has a history of diabetes mellitus type 2 diabetic nephropathy.  He also has a history of iron deficiency anemia with chronic GI bleeding is intermittent episodes of melenic stools is followed by Dr. Laural Golden her gastroenterologist.  With plans for ongoing endoscopic procedures to evaluate chronic GI bleed.  Appears that she is scheduled for Givens capsule endoscopy 11/10/2018.  This is to evaluate for small bowel source of melenic stool.  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  It appears that she has had a colonoscopy in the past 12/11/2015 with 2 tubular adenomas 4 polyps.  In the emergency room her hemoglobin was 7.9 and positive fecal occult blood test.  Blood pressure 158-75 pulse 68 temperature 97.7 O2 sats 98% room air  Sodium 134 potassium 4.1 chloride 95 CO2 28 glucose 148 BUN 34 creatinine 5.35 calcium 8.7 albumin 3.8 liver enzymes within normal range iron saturations 25% WBC 4.2 hemoglobin 9.6 platelets 143  Status post transfusion 2 units packed red blood cells 11/09/2018  Medications amlodipine 5 mg nightly carvedilol 6.25 mg twice daily hydralazine 25 mg 3 times daily Xanax 0.25 mg twice daily bupropion 75 mg daily Zoloft 75 mg daily insulin glargine 30  units twice daily, Protonix 40 mg daily sevelamer 800 mg 2 tablets with snacks 3 tablets with meals, Neurontin 600 mg twice daily  Past Medical History:  Diagnosis Date  . Anemia   . Anxiety   . Arthritis   . Chronic kidney disease    neuphrotic syndrome-05/2013  . Constipation   . Decreased vision    left eye  . Depression   . Diabetes mellitus    Type 2  . Diabetic neuropathy ([place])   . Diverticulitis   . Dry skin   . GERD (gastroesophageal reflux disease)   . GI bleed 07/05/2017  . Gout   . Headache   . Hypertension   . Neuropathy   . Shortness of breath dyspnea   . Skin cancer     Past Surgical History:  Procedure Laterality Date  . ABDOMINAL HYSTERECTOMY    . ADENOIDECTOMY    . APPENDECTOMY     taken out with hysterectomy  . AV FISTULA PLACEMENT Right 09/13/2014   Procedure: Creation of Right Arm RADIOCEPHALIC ARTERIOVENOUS (AV) FISTULA ;  Surgeon: [name], MD;  Location: [place];  Service: Vascular;  Laterality: Right;  . BACK SURGERY     lumbar -   . CATARACT EXTRACTION W/PHACO Left 07/02/2013   Procedure: CATARACT EXTRACTION PHACO AND INTRAOCULAR LENS PLACEMENT (IOC);  Surgeon: [name], MD;  Location: [facility];  Service: Ophthalmology;  Laterality: Left;  CDE:  6.46  . CATARACT EXTRACTION W/PHACO Right 07/12/2013   Procedure: CATARACT EXTRACTION PHACO AND INTRAOCULAR LENS PLACEMENT (IOC);  Surgeon: [name], MD;  Location: [facility];  Service: Ophthalmology;  Laterality: Right;  CDE:12.48  . COLONOSCOPY N/A 12/11/2015   Procedure: COLONOSCOPY;  Surgeon: [name], MD;  [facility];  Service: Endoscopy;  Laterality: N/A;  1:55  . ESOPHAGOGASTRODUODENOSCOPY N/A 03/11/2016   Procedure: ESOPHAGOGASTRODUODENOSCOPY (EGD);  Surgeon: [name], MD;  [facility];  Service: Endoscopy;  Laterality: N/A;  1230 - Last pt of the day-Dr. Laural Golden going to office to see pt's  . ESOPHAGOGASTRODUODENOSCOPY N/A 07/21/2017   Procedure:  ESOPHAGOGASTRODUODENOSCOPY (EGD);  Surgeon: [name], MD;  [facility];  Service: Endoscopy;  Laterality: N/A;  3:00  . FOOT SURGERY Right    x2-heel spur   . GIVENS CAPSULE STUDY N/A 11/09/2016   Procedure: GIVENS CAPSULE STUDY;  Surgeon: [name], MD;  [facility];  Service: Endoscopy;  Laterality: N/A;  7:30  . REVISON OF ARTERIOVENOUS FISTULA Right 01/05/2016   Procedure: REVISON OF ARTERIOVENOUS FISTULA- right forearm;  Surgeon: [name], MD;  Location: [place];  Service: Vascular;  Laterality: Right;  . SKIN CANCER EXCISION     forehead  . TONSILLECTOMY      Prior to Admission medications   Medication Sig Start Date End Date Taking? Authorizing Provider  acetaminophen (TYLENOL) 500 MG tablet Take 1,000 mg every 6 (six) hours as needed by mouth (FOR HEADACHES/MINOR DISCOMFORT).   Yes [provider]  ALPRAZolam (XANAX) 0.25 MG tablet Take 0.25 mg by mouth 2 (two) times daily.    Yes [provider]  aluminum-magnesium hydroxide-simethicone (MAALOX) 200-200-20 MG/5ML SUSP Take 30 mLs by mouth 4 (four) times daily as needed (heartburn/indigestion).   Yes [provider]  amLODipine (NORVASC) 5 MG tablet Take 5 mg by mouth at bedtime. (0800)   Yes [provider]  anti-nausea (EMETROL) solution Take 30 mLs every 15 (fifteen) minutes as needed by mouth for nausea or vomiting (X4 doses only).   Yes [provider]  b complex vitamins capsule Take 1 capsule daily by mouth. (0800)   Yes [provider]  B Complex-Biotin-FA (B-COMPLEX PO) Take 1 tablet by mouth every morning.   Yes [provider]  buPROPion (WELLBUTRIN) 75 MG tablet Take 75 mg daily by mouth. (0800)   Yes [provider]  carvedilol (COREG) 6.25 MG tablet Take 6.25 mg 2 (two) times daily with a meal by mouth. (0800&2000)   Yes [provider]  cholecalciferol (VITAMIN D) 1000 units tablet Take 1,000 Units by  mouth daily.   Yes [provider]  fexofenadine (ALLEGRA) 180 MG tablet Take 180 mg daily by mouth. (0800)   Yes [provider]  furosemide (LASIX) 20 MG tablet Take 20 mg by mouth daily as needed for fluid (swelling).   Yes [provider]  furosemide (LASIX) 40 MG tablet Take 40 mg daily by mouth. (0800)   Yes [provider]  gabapentin (NEURONTIN) 300 MG capsule Take 300 mg by mouth 2 (two) times daily.    Yes [provider]  glipiZIDE (GLUCOTROL) 5 MG tablet Take 5 mg 2 (two) times daily before a meal by mouth. (0800&2000)   Yes [provider]  guaifenesin (ROBITUSSIN) 100 MG/5ML syrup Take 200 mg every 6 (six) hours as needed by mouth for cough.   Yes [provider]  hydrALAZINE (APRESOLINE) 25 MG tablet Take 25 mg by mouth 3 (three) times daily. *HOLD MORNING DOSE ON MONDAYS, WEDNESDAYS, AND FRIDAYS   Yes [provider]  insulin glargine (LANTUS) 100 unit/mL SOPN  Inject 25-55 Units into the skin See admin instructions. 55units in the morning and 25 units in the evening   Yes [provider]  insulin lispro (HUMALOG) 100 UNIT/ML KiwkPen Inject 10 Units 3 (three) times daily before meals into the skin.   Yes [provider]  insulin regular (NOVOLIN R,HUMULIN R) 100 units/mL injection Inject 15 Units into the skin daily as needed for high blood sugar (for blood sugar levels over 400).   Yes [provider]  lidocaine-prilocaine (EMLA) cream Apply 1 application topically See admin instructions. Apply to access site 1 hour prior to dialysis on MONDAYS, [facility], and FRIDAYS   Yes [provider]  loperamide (IMODIUM A-D) 2 MG tablet Take 2 mg 4 (four) times daily as needed by mouth for diarrhea or loose stools (up to 8 doses in 24 hrs.).   Yes [provider]  Melatonin 5 MG CAPS Take 10 mg by mouth at bedtime.    Yes [provider]  pantoprazole (PROTONIX) 40 MG  tablet Take 40 mg daily at 6 (six) AM by mouth.    Yes [provider]  polyvinyl alcohol (LIQUIFILM TEARS) 1.4 % ophthalmic solution Place 1 drop 5 (five) times daily as needed into both eyes for dry eyes.   Yes [provider]  sertraline (ZOLOFT) 50 MG tablet Take 75 mg by mouth daily.    Yes [provider]  sevelamer carbonate (RENVELA) 800 MG tablet Take 1,600-2,400 mg by mouth See admin instructions. 3 tablets with meals and 2 tablets with snacks   Yes [provider]    Current Facility-Administered Medications  Medication Dose Route Frequency Provider Last Rate Last Dose  . 0.9 %  sodium chloride infusion  250 mL Intravenous PRN Barton Dubois, MD      . acetaminophen (TYLENOL) tablet 1,000 mg  1,000 mg Oral Q6H PRN Barton Dubois, MD   1,000 mg at 11/10/18 0620  . ALPRAZolam Duanne Moron) tablet 0.25 mg  0.25 mg Oral BID Barton Dubois, MD   0.25 mg at 11/09/18 2159  . alum & mag hydroxide-simeth (MAALOX/MYLANTA) 200-200-20 MG/5ML suspension 30 mL  30 mL Oral QID PRN Barton Dubois, MD      . amLODipine (NORVASC) tablet 5 mg  5 mg Oral QHS Barton Dubois, MD   5 mg at 11/09/18 2159  . buPROPion [facility]) tablet 75 mg  75 mg Oral Daily Barton Dubois, MD      . carvedilol (COREG) tablet 6.25 mg  6.25 mg Oral BID WC Barton Dubois, MD   6.25 mg at 11/09/18 1836  . cholecalciferol (VITAMIN D3) tablet 1,000 Units  1,000 Units Oral Daily Barton Dubois, MD      . gabapentin (NEURONTIN) capsule 600 mg  600 mg Oral BID Barton Dubois, MD   600 mg at 11/09/18 2159  . guaiFENesin (ROBITUSSIN) 100 MG/5ML solution 200 mg  200 mg Oral Q6H PRN Barton Dubois, MD      . hydrALAZINE (APRESOLINE) tablet 25 mg  25 mg Oral TID Barton Dubois, MD   25 mg at 11/09/18 2159  . insulin aspart (novoLOG) injection 0-9 Units  0-9 Units Subcutaneous TID WC Barton Dubois, MD   2 Units at 11/09/18 1837  . insulin glargine (LANTUS) injection 30 Units  30 Units Subcutaneous BID  Barton Dubois, MD   30 Units at 11/09/18 2159  . ondansetron (ZOFRAN) tablet 4 mg  4 mg Oral Q6H PRN Barton Dubois, MD       Or  .  ondansetron (ZOFRAN) injection 4 mg  4 mg Intravenous Q6H PRN Barton Dubois, MD      . pantoprazole (PROTONIX) EC tablet 40 mg  40 mg Oral BID Barton Dubois, MD   40 mg at 11/09/18 2159  . polyvinyl alcohol (LIQUIFILM TEARS) 1.4 % ophthalmic solution 1 drop  1 drop Both Eyes 5 X Daily PRN Barton Dubois, MD      . sertraline (ZOLOFT) tablet 75 mg  75 mg Oral Daily Barton Dubois, MD      . sevelamer carbonate (RENVELA) tablet 1,600 mg  1,600 mg Oral With snacks Barton Dubois, MD      . sevelamer carbonate (RENVELA) tablet 2,400 mg  2,400 mg Oral TID WC Barton Dubois, MD   2,400 mg at 11/09/18 1836  . sodium chloride flush (NS) 0.9 % injection 3 mL  3 mL Intravenous Q12H Barton Dubois, MD      . sodium chloride flush (NS) 0.9 % injection 3 mL  3 mL Intravenous PRN Barton Dubois, MD        Allergies as of 11/09/2018 - Review Complete 11/09/2018  Allergen Reaction Noted  . Penicillins Rash     Family History  Problem Relation Age of Onset  . Multiple myeloma Mother   . Diabetes Mother   . Heart attack Father 89    Social History   Socioeconomic History  . Marital status: Divorced    Spouse name: Not on file  . Number of children: Not on file  . Years of education: Not on file  . Highest education level: Not on file  Occupational History  . Not on file  Social Needs  . Financial resource strain: Not on file  . Food insecurity:    Worry: Not on file    Inability: Not on file  . Transportation needs:    Medical: Not on file    Non-medical: Not on file  Tobacco Use  . Smoking status: Current Every Day Smoker    Packs/day: 0.25    Years: 40.00    Pack years: 10.00    Types: Cigarettes  . Smokeless tobacco: Never Used  Substance and Sexual Activity  . Alcohol use: No    Alcohol/week: 0.0 standard drinks  . Drug use: No  . Sexual  activity: Yes    Birth control/protection: Surgical  Lifestyle  . Physical activity:    Days per week: Not on file    Minutes per session: Not on file  . Stress: Not on file  Relationships  . Social connections:    Talks on phone: Not on file    Gets together: Not on file    Attends religious service: Not on file    Active member of club or organization: Not on file    Attends meetings of clubs or organizations: Not on file    Relationship status: Not on file  . Intimate partner violence:    Fear of current or ex partner: Not on file    Emotionally abused: Not on file    Physically abused: Not on file    Forced sexual activity: Not on file  Other Topics Concern  . Not on file  Social History Narrative   No children    Review of Systems: Gen: Denies any fever, chills, sweats, anorexia, fatigue, weakness, malaise, weight loss, and sleep disorder HEENT: History of cataract no history of nasal bleeding CV: Denies chest pain, angina, palpitations, syncope, orthopnea, PND, peripheral edema, and claudication.  No cardiac history  Resp: Denies dyspnea at rest, dyspnea with exercise, cough, sputum, wheezing, coughing up blood, and pleurisy.  No history of COPD GI: Some odynophagia, some melena stool chronic GI bleed evaluating etiology appreciate assistance of Dr. Corbin Ade. GU : End-stage renal disease oligo anuria MS: Denies joint pain, limitation of movement, and swelling, stiffness, low back pain, extremity pain. Denies muscle weakness, cramps, atrophy.  No use of non steroidal antiinflammatory drugs. Derm: Denies rash, itching, dry skin, hives, moles, warts, or unhealing ulcers.  Psych: Denies depression, anxiety, memory loss, suicidal ideation, hallucinations, paranoia, and confusion. Heme: Appears to have had chronic issues with vascular ectasia and chronic GI bleeding no history of cancer Neuro: Neurontin prescribed fairly high doses no history of seizures. No paresthesias.  No  weakness. Endocrine diabetic mellitus type II with complications.  No Thyroid disease.  No Adrenal disease.  Physical Exam: Vital signs in last 24 hours: Temp:  [97.7 F (36.5 C)-98.5 F (36.9 C)] 97.7 F (36.5 C) (03/06 0619) Pulse Rate:  [65-80] 68 (03/06 0619) Resp:  [16-20] 18 (03/06 0619) BP: (119-167)/(44-95) 158/75 (03/06 0619) SpO2:  [94 %-99 %] 98 % (03/06 0619) Weight:  [98.4 kg-99.4 kg] 98.9 kg (03/06 0859) Last BM Date: 11/09/18 General:   Alert, chronically ill-appearing lady nondistressed Head:  Normocephalic and atraumatic. Eyes:  Sclera clear, no icterus.   Conjunctiva pink. Ears:  Normal auditory acuity. Nose:  No deformity, discharge,  or lesions. Mouth:  No deformity or lesions, dentition normal. Neck:  Supple; no masses or thyromegaly. JVP not elevated Lungs:  Clear throughout to auscultation.   No wheezes, crackles, or rhonchi. No acute distress. Heart:  Regular rate and rhythm; 3/6 systolic murmur no rubs or gallops Abdomen:  Soft, nontender and nondistended. No masses, hepatosplenomegaly or hernias noted. Normal bowel sounds, without guarding, and without rebound.   Msk:  Symmetrical without gross deformities. Normal posture. Pulses:  No carotid, renal, femoral bruits. DP and PT symmetrical and equal Extremities:  Without clubbing or edema.  AV fistula right arm thrill and bruit Neurologic:  Alert and  oriented x4;  grossly normal neurologically. Skin:  Intact without significant lesions or rashes. Cervical Nodes:  No significant cervical adenopathy. Psych:  Alert and cooperative. Normal mood and affect.  Intake/Output from previous day: 03/05 0701 - 03/06 0700 In: 879.6 [P.O.:240; I.V.:24.6; Blood:615] Out: 200 [Urine:200] Intake/Output this shift: No intake/output data recorded.  Lab Results: Recent Labs    11/09/18 1004 11/10/18 0742  WBC 5.5 4.2  HGB 7.9* 9.6*  HCT 25.1* 29.5*  PLT 167 143*   BMET Recent Labs    11/09/18 1004  NA 134*   K 4.1  CL 95*  CO2 28  GLUCOSE 148*  BUN 34*  CREATININE 5.35*  CALCIUM 8.7*   LFT Recent Labs    11/09/18 1004  PROT 7.2  ALBUMIN 3.8  AST 21  ALT 17  ALKPHOS 94  BILITOT 0.6   PT/INR No results for input(s): LABPROT, INR in the last 72 hours. Hepatitis Panel No results for input(s): HEPBSAG, HCVAB, HEPAIGM, HEPBIGM in the last 72 hours.  Studies/Results: No results found.  Assessment/Plan:  ESRD-Monday Wednesday Friday dialysis at [facility] will proceed with dialysis treatment today.  This is 11/10/2018.  She has a AV fistula left arm with thrill and bruit  ANEMIA-evaluation with capsule endoscopy per Dr. 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  We will need to get records from [place] dialysis to evaluate use of vitamin D with  dialysis  HTN/VOL-appears to have no evidence of volume overload she appears to be euvolemic we will continue her dialysis and challenge dry weight.  She does have a history of hypertension uses antihypertensive medications these may need to be adjusted as dry weight is decreased  Depression using both Wellbutrin and Zoloft.  She appears to be well controlled we will check TSH  Neuropathy she is using fairly high doses of Neurontin I will decrease this to 300 mg nightly avoid oversedation.  Diabetes mellitus per primary team   LOS: [place] _0 _1 :16 AM

## 2018-11-10 NOTE — Care Management Obs Status (Signed)
[facility] NOTIFICATION   Patient Details  Name: Tanya Lucero MRN: [id number] Date of Birth: 1950-12-24   Medicare Observation Status Notification Given:  Yes    Tommy Medal 11/10/2018, 2:07 PM

## 2018-11-11 LAB — BPAM RBC
BLOOD PRODUCT EXPIRATION DATE: 202004042359
Blood Product Expiration Date: 202004042359
ISSUE DATE / TIME: 202003052115
ISSUE DATE / TIME: 202003060137
Unit Type and Rh: 5100
Unit Type and Rh: 5100

## 2018-11-11 LAB — TYPE AND SCREEN
ABO/RH(D): O POS
Antibody Screen: NEGATIVE
Donor AG Type: NEGATIVE
Donor AG Type: NEGATIVE
Unit division: 0
Unit division: 0

## 2018-11-12 DIAGNOSIS — N186 End stage renal disease: Secondary | ICD-10-CM | POA: Diagnosis not present

## 2018-11-12 DIAGNOSIS — Z9889 Other specified postprocedural states: Secondary | ICD-10-CM

## 2018-11-12 DIAGNOSIS — K922 Gastrointestinal hemorrhage, unspecified: Secondary | ICD-10-CM | POA: Diagnosis not present

## 2018-11-12 DIAGNOSIS — D500 Iron deficiency anemia secondary to blood loss (chronic): Secondary | ICD-10-CM | POA: Diagnosis not present

## 2018-11-12 DIAGNOSIS — K31819 Angiodysplasia of stomach and duodenum without bleeding: Secondary | ICD-10-CM

## 2018-11-12 NOTE — Op Note (Signed)
Small Bowel Givens Capsule Study Procedure date: 11/10/2018  Referring Provider: Barton Dubois, MD PCP:  Dr. [name], DO  Indication for procedure:    Patient is 68 year old Caucasian female with multiple problems including end-stage renal disease on hemodialysis who presents with anemia with need for frequent transfusion.  She has history of GI bleed and iron deficiency anemia well-documented the past when she had multiple studies.  It is suspected she may be bleeding from small bowel and therefore undergoing diagnostic small bowel given capsule study.  Her hemoglobin at hemodialysis clinic yesterday was 7.1 g. Patient has received 2 units of PRBCs last night and her hemoglobin has increased to  9.6.  Findings:    Patient was able to swallow given capsule without any difficulty. Study duration is 8 hours 21 minutes and 52 seconds. Small sliding hiatal hernia noted. Multiple telangiectasia noted involving gastric antral mucosa but without active bleeding or stigmata of bleed. Normal mucosa of small bowel.   First Gastric image: 40 sec First Duodenal image: 33 min and 23 sec First Ileo-Cecal Valve image: 1 hour 35 min and 35 sec First Cecal image: 1 hour 37 minutes and 4 seconds Gastric Passage time: 32 min and 33 seconds Small Bowel Passage time: 1 hr 3 min and 41 sec  Summary & Recommendations:  Small sliding hiatal hernia. 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 Normal mucosa of small bowel with a short a rapid transit time.  If patient is documented to have another episode of melena or gross bleeding will proceed with therapeutic EGD.

## 2018-11-14 ENCOUNTER — Other Ambulatory Visit (INDEPENDENT_AMBULATORY_CARE_PROVIDER_SITE_OTHER): Payer: Self-pay | Admitting: *Deleted

## 2018-11-14 DIAGNOSIS — K922 Gastrointestinal hemorrhage, unspecified: Secondary | ICD-10-CM

## 2018-11-16 LAB — HEMOGLOBIN AND HEMATOCRIT, BLOOD
HCT: 29.5 % — ABNORMAL LOW (ref 35.0–45.0)
Hemoglobin: 10.1 g/dL — ABNORMAL LOW (ref 11.7–15.5)

## 2018-11-17 ENCOUNTER — Telehealth (INDEPENDENT_AMBULATORY_CARE_PROVIDER_SITE_OTHER): Payer: Self-pay | Admitting: *Deleted

## 2018-11-17 NOTE — Telephone Encounter (Signed)
   Diagnosis:    Result(s)   Card 1: Positive: gross positive          Completed by: Thomas Hoff , LPN   HEMOCCULT SENSA DEVELOPER: LOT#: [id number]  EXPIRATION DATE: 2021-11   HEMOCCULT SENSA CARD:  LOT#:  [id number] EXPIRATION DATE: 08/21   CARD CONTROL RESULTS:  POSITIVE: Positive NEGATIVE: Negative    ADDITIONAL COMMENTS: I have made Dr.Rehman aware.

## 2018-11-20 ENCOUNTER — Other Ambulatory Visit (INDEPENDENT_AMBULATORY_CARE_PROVIDER_SITE_OTHER): Payer: Self-pay | Admitting: *Deleted

## 2018-11-20 DIAGNOSIS — R195 Other fecal abnormalities: Secondary | ICD-10-CM | POA: Insufficient documentation

## 2018-11-23 ENCOUNTER — Ambulatory Visit (HOSPITAL_COMMUNITY)
Admission: RE | Admit: 2018-11-23 | Discharge: 2018-11-23 | Disposition: A | Discharge status: Home / Self Care | Payer: Medicare Other | Attending: Internal Medicine | Admitting: Internal Medicine

## 2018-11-23 ENCOUNTER — Other Ambulatory Visit: Payer: Self-pay

## 2018-11-23 ENCOUNTER — Encounter (INDEPENDENT_AMBULATORY_CARE_PROVIDER_SITE_OTHER): Payer: Self-pay | Admitting: *Deleted

## 2018-11-23 ENCOUNTER — Telehealth (INDEPENDENT_AMBULATORY_CARE_PROVIDER_SITE_OTHER): Payer: Self-pay | Admitting: *Deleted

## 2018-11-23 ENCOUNTER — Encounter (HOSPITAL_COMMUNITY)
Admission: RE | Disposition: A | Discharge status: Home / Self Care | Payer: Self-pay | Source: Home / Self Care | Attending: Internal Medicine

## 2018-11-23 ENCOUNTER — Other Ambulatory Visit (INDEPENDENT_AMBULATORY_CARE_PROVIDER_SITE_OTHER): Payer: Self-pay | Admitting: *Deleted

## 2018-11-23 DIAGNOSIS — R195 Other fecal abnormalities: Secondary | ICD-10-CM

## 2018-11-23 DIAGNOSIS — K31819 Angiodysplasia of stomach and duodenum without bleeding: Secondary | ICD-10-CM

## 2018-11-23 SURGERY — IMAGING PROCEDURE, GI TRACT, INTRALUMINAL, VIA CAPSULE

## 2018-11-23 NOTE — Telephone Encounter (Signed)
Patient needs to have EGD next week , GAVE. The order has been put in.

## 2018-11-23 NOTE — OR Nursing (Signed)
Patient scheduled for a Givens Capsule Study. Patient had this procedure done while being inpatient on 11/10/2018. Notified Dr. Laural Golden who said the patient does not need this procedure again. Informed patient and she verbalized understanding.

## 2018-11-24 MED ORDER — KETAMINE HCL 50 MG/5ML IJ SOSY
PREFILLED_SYRINGE | INTRAMUSCULAR | Status: AC
  Filled 2018-11-24: qty 5

## 2018-11-24 MED ORDER — PROPOFOL 10 MG/ML IV BOLUS
INTRAVENOUS | Status: AC
  Filled 2018-11-24: qty 40

## 2018-11-30 ENCOUNTER — Ambulatory Visit (HOSPITAL_COMMUNITY)
Admission: RE | Admit: 2018-11-30 | Discharge: 2018-11-30 | Disposition: A | Discharge status: Home / Self Care | Payer: Medicare Other | Attending: Internal Medicine | Admitting: Internal Medicine

## 2018-11-30 ENCOUNTER — Other Ambulatory Visit: Payer: Self-pay

## 2018-11-30 ENCOUNTER — Encounter (INDEPENDENT_AMBULATORY_CARE_PROVIDER_SITE_OTHER): Payer: Self-pay | Admitting: *Deleted

## 2018-11-30 ENCOUNTER — Encounter (HOSPITAL_COMMUNITY): Payer: Self-pay

## 2018-11-30 ENCOUNTER — Encounter (HOSPITAL_COMMUNITY)
Admission: RE | Disposition: A | Discharge status: Home / Self Care | Payer: Self-pay | Source: Home / Self Care | Attending: Internal Medicine

## 2018-11-30 DIAGNOSIS — Z794 Long term (current) use of insulin: Secondary | ICD-10-CM | POA: Diagnosis not present

## 2018-11-30 DIAGNOSIS — K228 Other specified diseases of esophagus: Secondary | ICD-10-CM

## 2018-11-30 DIAGNOSIS — N186 End stage renal disease: Secondary | ICD-10-CM | POA: Insufficient documentation

## 2018-11-30 DIAGNOSIS — F17210 Nicotine dependence, cigarettes, uncomplicated: Secondary | ICD-10-CM | POA: Insufficient documentation

## 2018-11-30 DIAGNOSIS — Z79899 Other long term (current) drug therapy: Secondary | ICD-10-CM | POA: Insufficient documentation

## 2018-11-30 DIAGNOSIS — F329 Major depressive disorder, single episode, unspecified: Secondary | ICD-10-CM | POA: Insufficient documentation

## 2018-11-30 DIAGNOSIS — I120 Hypertensive chronic kidney disease with stage 5 chronic kidney disease or end stage renal disease: Secondary | ICD-10-CM | POA: Insufficient documentation

## 2018-11-30 DIAGNOSIS — F419 Anxiety disorder, unspecified: Secondary | ICD-10-CM | POA: Insufficient documentation

## 2018-11-30 DIAGNOSIS — K219 Gastro-esophageal reflux disease without esophagitis: Secondary | ICD-10-CM | POA: Diagnosis not present

## 2018-11-30 DIAGNOSIS — Z992 Dependence on renal dialysis: Secondary | ICD-10-CM | POA: Diagnosis not present

## 2018-11-30 DIAGNOSIS — K317 Polyp of stomach and duodenum: Secondary | ICD-10-CM | POA: Insufficient documentation

## 2018-11-30 DIAGNOSIS — D500 Iron deficiency anemia secondary to blood loss (chronic): Secondary | ICD-10-CM | POA: Insufficient documentation

## 2018-11-30 DIAGNOSIS — Z85828 Personal history of other malignant neoplasm of skin: Secondary | ICD-10-CM | POA: Diagnosis not present

## 2018-11-30 DIAGNOSIS — K31819 Angiodysplasia of stomach and duodenum without bleeding: Secondary | ICD-10-CM | POA: Diagnosis present

## 2018-11-30 DIAGNOSIS — E1122 Type 2 diabetes mellitus with diabetic chronic kidney disease: Secondary | ICD-10-CM | POA: Insufficient documentation

## 2018-11-30 HISTORY — PX: ESOPHAGOGASTRODUODENOSCOPY: SHX5428

## 2018-11-30 LAB — GLUCOSE, CAPILLARY
Glucose-Capillary: 28 mg/dL — CL (ref 70–99)
Glucose-Capillary: 131 mg/dL — ABNORMAL HIGH (ref 70–99)
Glucose-Capillary: 103 mg/dL — ABNORMAL HIGH (ref 70–99)
Glucose-Capillary: 167 mg/dL — ABNORMAL HIGH (ref 70–99)

## 2018-11-30 SURGERY — EGD (ESOPHAGOGASTRODUODENOSCOPY)
Anesthesia: Moderate Sedation

## 2018-11-30 MED ORDER — MEPERIDINE HCL 50 MG/ML IJ SOLN
INTRAMUSCULAR | Status: AC
  Filled 2018-11-30: qty 1

## 2018-11-30 MED ORDER — MIDAZOLAM HCL 5 MG/5ML IJ SOLN
INTRAMUSCULAR | Status: DC | PRN
  Administered 2018-11-30: 2 mg via INTRAVENOUS
  Administered 2018-11-30: 1 mg via INTRAVENOUS
  Administered 2018-11-30: 2 mg via INTRAVENOUS

## 2018-11-30 MED ORDER — LIDOCAINE VISCOUS HCL 2 % MT SOLN
OROMUCOSAL | Status: AC
  Filled 2018-11-30: qty 15

## 2018-11-30 MED ORDER — DEXTROSE 50 % IV SOLN
INTRAVENOUS | Status: AC
  Filled 2018-11-30: qty 50

## 2018-11-30 MED ORDER — FENTANYL CITRATE (PF) 100 MCG/2ML IJ SOLN
INTRAMUSCULAR | Status: DC | PRN
  Administered 2018-11-30 (×2): 25 ug via INTRAVENOUS

## 2018-11-30 MED ORDER — SODIUM CHLORIDE 0.9 % IV SOLN: INTRAVENOUS | Status: DC

## 2018-11-30 MED ORDER — DEXTROSE 50 % IV SOLN
1.0000 | Freq: Once | INTRAVENOUS | Status: AC
  Administered 2018-11-30: 50 mL via INTRAVENOUS

## 2018-11-30 MED ORDER — MIDAZOLAM HCL 5 MG/5ML IJ SOLN
INTRAMUSCULAR | Status: AC
  Filled 2018-11-30: qty 10

## 2018-11-30 MED ORDER — STERILE WATER FOR IRRIGATION IR SOLN
Status: DC | PRN
  Administered 2018-11-30: 100 mL

## 2018-11-30 MED ORDER — FENTANYL CITRATE (PF) 100 MCG/2ML IJ SOLN
INTRAMUSCULAR | Status: AC
  Filled 2018-11-30: qty 2

## 2018-11-30 MED ORDER — DEXTROSE-NACL 5-0.9 % IV SOLN
INTRAVENOUS | Status: DC
  Administered 2018-11-30: 12:00:00 via INTRAVENOUS

## 2018-11-30 MED ORDER — LIDOCAINE VISCOUS HCL 2 % MT SOLN
OROMUCOSAL | Status: DC | PRN
  Administered 2018-11-30: 4 mL via OROMUCOSAL

## 2018-11-30 NOTE — Op Note (Addendum)
[facility] Patient Name: Tanya Lucero Procedure Date: 11/30/2018 12:33 PM MRN: [id number] Date of Birth: 10-25-1950 Attending MD: Hildred Laser , MD CSN: [id number] Age: 68 Admit Type: Outpatient Procedure:                Upper GI endoscopy Indications:              Therapeutic procedure for APC of GAVE, Iron                            deficiency anemia secondary to chronic blood loss Providers:                Hildred Laser, MD, Rosina Lowenstein, RN, Randa Spike, Technician, Raphael Gibney Referring MD:             [name], DO Medicines:                Lidocaine spray, Fentanyl 50 micrograms IV,                            Midazolam 5 mg IV Complications:            No immediate complications. Estimated Blood Loss:     Estimated blood loss: none. Procedure:                Pre-Anesthesia Assessment:                           - Prior to the procedure, a History and Physical                            was performed, and patient medications and                            allergies were reviewed. The patient's tolerance of                            previous anesthesia was also reviewed. The risks                            and benefits of the procedure and the sedation                            options and risks were discussed with the patient.                            All questions were answered, and informed consent                            was obtained. Prior Anticoagulants: The patient has                            taken no previous anticoagulant or antiplatelet  agents. ASA Grade Assessment: III - A patient with                            severe systemic disease. After reviewing the risks                            and benefits, the patient was deemed in                            satisfactory condition to undergo the procedure.                           After obtaining informed consent, the endoscope was                      passed under direct vision. Throughout the                            procedure, the patient's blood pressure, pulse, and                            oxygen saturations were monitored continuously. The                            [id number]-H190 ([id number]) was introduced through the                            mouth, and advanced to the second part of duodenum.                            The upper GI endoscopy was accomplished without                            difficulty. The patient tolerated the procedure                            well. Scope In: 12:54:19 PM Scope Out: 1:14:29 PM Total Procedure Duration: 0 hours 20 minutes 10 seconds  Findings:      The examined esophagus was normal.      The Z-line was irregular and was found 41 cm from the incisors.      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       Coagulation for bleeding prevention using argon plasma was successful.      A single 5 mm sessile polyp with no stigmata of recent bleeding was       found in the gastric fundus.      The exam of the stomach was otherwise normal.      The duodenal bulb and second portion of the duodenum were normal. Impression:               - Normal esophagus.                           - Z-line irregular, 41  cm from the incisors.                           - Gastric antral vascular ectasia without bleeding.                            Treated with argon plasma coagulation (APC).                            50%lesions ablated.                           - A single gastric polyp.                           - Normal duodenal bulb and second portion of the                            duodenum.                           - No specimens collected. Moderate Sedation:      Moderate (conscious) sedation was administered by the endoscopy nurse       and supervised by the endoscopist. The following  parameters were       monitored: oxygen saturation, heart rate, blood pressure, CO2       capnography and response to care. Total physician intraservice time was       25 minutes. Recommendation:           - Written discharge instructions were provided to                            the patient.                           - Continue present medications.                           - No aspirin, ibuprofen, naproxen, or other                            non-steroidal anti-inflammatory drugs.                           - H/H in 2 weeks.                           - Repeat upper endoscopy in 1 month. Procedure Code(s):        --- Professional ---                           [phone], Esophagogastroduodenoscopy, flexible,                            transoral; with control of bleeding, any method                           99153, Moderate  sedation; each additional 15                            minutes intraservice time                           G0500, Moderate sedation services provided by the                            same physician or other qualified health care                            professional performing a gastrointestinal                            endoscopic service that sedation supports,                            requiring the presence of an independent trained                            observer to assist in the monitoring of the                            patient's level of consciousness and physiological                            status; initial 15 minutes of intra-service time;                            patient age 39 years or older (additional time may                            be reported with [phone], as appropriate) Diagnosis Code(s):        --- Professional ---                           K22.8, Other specified diseases of esophagus                           K31.819, Angiodysplasia of stomach and duodenum                            without bleeding                           K31.7,  Polyp of stomach and duodenum                           D50.0, Iron deficiency anemia secondary to blood                            loss (chronic) CPT copyright 2019 American Medical Association. All rights reserved. The codes documented in this report are preliminary and upon coder review may  be revised to meet current compliance requirements. [employer]  Laural Golden, MD Hildred Laser, MD 11/30/2018 1:28:59 PM This report has been signed electronically. Number of Addenda: 0

## 2018-11-30 NOTE — H&P (Signed)
Tanya Lucero is an 68 y.o. female.   Chief Complaint: Patient is here for EGD and APC ablation of gastric antral vascular ectasia HPI: Patient 68 year old Caucasian female with multiple medical problems including end-stage renal disease on hemodialysis who has recurrent GI bleed with anemia and need for transfusion.  She was hospitalized about 2 weeks ago for melena and received 2 units of PRBCs.  She underwent small bowel given capsule study which revealed gastric antral vascular ectasia.  While there was no active bleeding.  It was noted that these lesions had increased since her last study of 2 years ago.  Her stool was checked last week and was strongly positive.  She is therefore returning for therapeutic EGD.  Last EGD was in November 2018.  Past Medical History:  Diagnosis Date  . Anemia   . Anxiety   . Arthritis   . Chronic kidney disease    neuphrotic syndrome-05/2013  . Constipation   . Decreased vision    left eye  . Depression   . Diabetes mellitus    Type 2  . Diabetic neuropathy ([place])   . Diverticulitis   . Dry skin   . GERD (gastroesophageal reflux disease)   . GI bleed 07/05/2017  . Gout   . Headache   . Hypertension   . Neuropathy   . Shortness of breath dyspnea   . Skin cancer     Past Surgical History:  Procedure Laterality Date  . ABDOMINAL HYSTERECTOMY    . ADENOIDECTOMY    . APPENDECTOMY     taken out with hysterectomy  . AV FISTULA PLACEMENT Right 09/13/2014   Procedure: Creation of Right Arm RADIOCEPHALIC ARTERIOVENOUS (AV) FISTULA ;  Surgeon: [name], MD;  Location: [place];  Service: Vascular;  Laterality: Right;  . BACK SURGERY     lumbar -   . CATARACT EXTRACTION W/PHACO Left 07/02/2013   Procedure: CATARACT EXTRACTION PHACO AND INTRAOCULAR LENS PLACEMENT (IOC);  Surgeon: [name], MD;  Location: [facility];  Service: Ophthalmology;  Laterality: Left;  CDE:  6.46  . CATARACT EXTRACTION W/PHACO Right 07/12/2013   Procedure: CATARACT EXTRACTION  PHACO AND INTRAOCULAR LENS PLACEMENT (IOC);  Surgeon: [name], MD;  Location: [facility];  Service: Ophthalmology;  Laterality: Right;  CDE:12.48  . COLONOSCOPY N/A 12/11/2015   Procedure: COLONOSCOPY;  Surgeon: [name], MD;  [facility];  Service: Endoscopy;  Laterality: N/A;  1:55  . ESOPHAGOGASTRODUODENOSCOPY N/A 03/11/2016   Procedure: ESOPHAGOGASTRODUODENOSCOPY (EGD);  Surgeon: [name], MD;  [facility];  Service: Endoscopy;  Laterality: N/A;  1230 - Last pt of the day-Dr. Laural Golden going to office to see pt's  . ESOPHAGOGASTRODUODENOSCOPY N/A 07/21/2017   Procedure: ESOPHAGOGASTRODUODENOSCOPY (EGD);  Surgeon: [name], MD;  [facility];  Service: Endoscopy;  Laterality: N/A;  3:00  . FOOT SURGERY Right    x2-heel spur   . GIVENS CAPSULE STUDY N/A 11/09/2016   Procedure: GIVENS CAPSULE STUDY;  Surgeon: [name], MD;  [facility];  Service: Endoscopy;  Laterality: N/A;  7:30  . GIVENS CAPSULE STUDY N/A 11/10/2018   Procedure: GIVENS CAPSULE STUDY;  Surgeon: [name], MD;  [facility];  Service: Endoscopy;  Laterality: N/A;  . REVISON OF ARTERIOVENOUS FISTULA Right 01/05/2016   Procedure: REVISON OF ARTERIOVENOUS FISTULA- right forearm;  Surgeon: [name], MD;  Location: [place];  Service: Vascular;  Laterality: Right;  . SKIN  CANCER EXCISION     forehead  . TONSILLECTOMY      Family History  Problem Relation Age of Onset  . Multiple myeloma Mother   . Diabetes Mother   . Heart attack Father 75   Social History:  reports that she has been smoking cigarettes. She has a 10.00 pack-year smoking history. She has never used smokeless tobacco. She reports that she does not drink alcohol or use drugs.  Allergies:  Allergies  Allergen Reactions  . Penicillins Rash    Has patient had a PCN reaction causing immediate rash, facial/tongue/throat swelling, SOB or lightheadedness with hypotension: Unknown Has  patient had a PCN reaction causing severe rash involving mucus membranes or skin necrosis: Unknown Has patient had a PCN reaction that required hospitalization: Unknown Has patient had a PCN reaction occurring within the last 10 years: Unknown If all of the above answers are "NO", then may proceed with Cephalosporin use.     Medications Prior to Admission  Medication Sig Dispense Refill  . acetaminophen (TYLENOL) 500 MG tablet Take 1,000 mg every 6 (six) hours as needed by mouth (FOR HEADACHES/MINOR DISCOMFORT).    Marland Kitchen ALPRAZolam (XANAX) 0.25 MG tablet Take 0.25 mg by mouth 2 (two) times daily. (0800 & 2000)    . aluminum-magnesium hydroxide-simethicone (MAALOX) 350-093-81 MG/5ML SUSP Take 30 mLs by mouth 4 (four) times daily as needed (heartburn/indigestion).    Marland Kitchen amLODipine (NORVASC) 5 MG tablet Take 5 mg by mouth at bedtime. (0800)    . anti-nausea (EMETROL) solution Take 30 mLs every 15 (fifteen) minutes as needed by mouth for nausea or vomiting (X4 doses only).    Marland Kitchen b complex vitamins capsule Take 1 capsule daily by mouth. (0800)    . buPROPion (WELLBUTRIN) 75 MG tablet Take 75 mg daily by mouth. (0800)    . carvedilol (COREG) 6.25 MG tablet Take 6.25 mg 2 (two) times daily with a meal by mouth. (0800&2000)    . cholecalciferol (VITAMIN D) 1000 units tablet Take 1,000 Units by mouth daily.    . fexofenadine (ALLEGRA) 180 MG tablet Take 180 mg daily by mouth. (0800)    . furosemide (LASIX) 40 MG tablet Take 40 mg daily by mouth. (0800)    . gabapentin (NEURONTIN) 300 MG capsule Take 300 mg by mouth 2 (two) times daily.     Marland Kitchen glipiZIDE (GLUCOTROL) 5 MG tablet Take 5 mg 2 (two) times daily before a meal by mouth. (0800&2000)    . guaifenesin (ROBITUSSIN) 100 MG/5ML syrup Take 200 mg every 6 (six) hours as needed by mouth for cough.    . hydrALAZINE (APRESOLINE) 25 MG tablet Take 25 mg by mouth 3 (three) times daily. *HOLD MORNING DOSE ON MONDAYS, WEDNESDAYS, AND FRIDAYS    . insulin glargine  (LANTUS) 100 unit/mL SOPN Inject 25-55 Units into the skin See admin instructions. 55units in the morning and 25 units in the evening    . insulin lispro (HUMALOG) 100 UNIT/ML KiwkPen Inject 10 Units 3 (three) times daily before meals into the skin.    Marland Kitchen insulin regular (NOVOLIN R,HUMULIN R) 100 units/mL injection Inject 15 Units into the skin daily as needed for high blood sugar (for blood sugar levels over 400).    Marland Kitchen loperamide (IMODIUM A-D) 2 MG tablet Take 2 mg 4 (four) times daily as needed by mouth for diarrhea or loose stools (up to 8 doses in 24 hrs.).    Marland Kitchen Melatonin 5 MG CAPS Take 10 mg by mouth at  bedtime.     . pantoprazole (PROTONIX) 40 MG tablet Take 1 tablet (40 mg total) by mouth 2 (two) times daily.    . polyvinyl alcohol (LIQUIFILM TEARS) 1.4 % ophthalmic solution Place 1 drop 5 (five) times daily as needed into both eyes for dry eyes.    Marland Kitchen sertraline (ZOLOFT) 50 MG tablet Take 75 mg by mouth daily.     . sevelamer carbonate (RENVELA) 800 MG tablet Take 1,600-2,400 mg by mouth See admin instructions. 3 tablets with meals and 2 tablets with snacks    . furosemide (LASIX) 20 MG tablet Take 20 mg by mouth at bedtime as needed (swelling).    Marland Kitchen lidocaine-prilocaine (EMLA) cream Apply 1 application topically See admin instructions. Apply to access site 1 hour prior to dialysis on MONDAYS, [facility], and FRIDAYS      Results for orders placed or performed during the hospital encounter of 11/30/18 (from the past 48 hour(s))  Glucose, capillary     Status: Abnormal   Collection Time: 11/30/18 11:26 AM  Result Value Ref Range   Glucose-Capillary 28 (LL) 70 - 99 mg/dL   Comment 1 MD NOTIFIED   Glucose, capillary     Status: Abnormal   Collection Time: 11/30/18 11:57 AM  Result Value Ref Range   Glucose-Capillary 131 (H) 70 - 99 mg/dL   No results found.  ROS  Blood pressure (!) 128/50, pulse 64, temperature 99 F (37.2 C), temperature source Oral, resp. rate 13, weight 98.9 kg,  SpO2 96 %. Physical Exam  Constitutional: She appears well-developed and well-nourished.  HENT:  Mouth/Throat: Oropharynx is clear and moist.  Eyes: Conjunctivae are normal. No scleral icterus.  Neck: No thyromegaly present.  Cardiovascular: Normal rate and regular rhythm.  Murmur heard. Faint systolic murmur best heard at left sternal border.  Respiratory: Effort normal and breath sounds normal.  GI:  Movement is full.  She has mild tenderness below the right costal margin as well as below the left costal margin.  No organomegaly or masses.  Musculoskeletal:        General: Edema present.     Comments: Mild pitting edema noted involving both feet right greater than left.  Neurological: She is alert.  Skin: Skin is warm and dry.  She has AV fistula in right arm.     Assessment/Plan Recurrent/chronic GI bleed and anemia and need for blood transfusion. EGD with APC ablation of gastric antral vascular ectasia.  Hildred Laser, MD 11/30/2018, 12:40 PM

## 2018-11-30 NOTE — Discharge Instructions (Signed)
Resume usual medications and diet. No driving for 24 hours. Hemoglobin to be checked in 2 weeks.  Office will remind you.  PATIENT INSTRUCTIONS POST-ANESTHESIA  IMMEDIATELY FOLLOWING SURGERY:  Do not drive or operate machinery for the first twenty four hours after surgery.  Do not make any important decisions for twenty four hours after surgery or while taking narcotic pain medications or sedatives.  If you develop intractable nausea and vomiting or a severe headache please notify your doctor immediately.  FOLLOW-UP:  Please make an appointment with your surgeon as instructed. You do not need to follow up with anesthesia unless specifically instructed to do so.  WOUND CARE INSTRUCTIONS (if applicable):  Keep a dry clean dressing on the anesthesia/puncture wound site if there is drainage.  Once the wound has quit draining you may leave it open to air.  Generally you should leave the bandage intact for twenty four hours unless there is drainage.  If the epidural site drains for more than 36-48 hours please call the anesthesia department.  QUESTIONS?:  Please feel free to call your physician or the hospital operator if you have any questions, and they will be happy to assist you.       Upper Endoscopy, Adult, Care After This sheet gives you information about how to care for yourself after your procedure. Your health care provider may also give you more specific instructions. If you have problems or questions, contact your health care provider. What can I expect after the procedure? After the procedure, it is common to have:  A sore throat.  Mild stomach pain or discomfort.  Bloating.  Nausea. Follow these instructions at home:   Follow instructions from your health care provider about what to eat or drink after your procedure.  Return to your normal activities as told by your health care provider. Ask your health care provider what activities are safe for you.  Take over-the-counter  and prescription medicines only as told by your health care provider.  Do not drive for 24 hours if you were given a sedative during your procedure.  Keep all follow-up visits as told by your health care provider. This is important. Contact a health care provider if you have:  A sore throat that lasts longer than one day.  Trouble swallowing. Get help right away if:  You vomit blood or your vomit looks like coffee grounds.  You have: ? A fever. ? Bloody, black, or tarry stools. ? A severe sore throat or you cannot swallow. ? Difficulty breathing. ? Severe pain in your chest or abdomen. Summary  After the procedure, it is common to have a sore throat, mild stomach discomfort, bloating, and nausea.  Do not drive for 24 hours if you were given a sedative during the procedure.  Follow instructions from your health care provider about what to eat or drink after your procedure.  Return to your normal activities as told by your health care provider. This information is not intended to replace advice given to you by your health care provider. Make sure you discuss any questions you have with your health care provider. Document Released: 02/22/2012 Document Revised: 01/23/2018 Document Reviewed: 01/23/2018 Elsevier Interactive Patient Education  2019 [employer].

## 2018-11-30 NOTE — Progress Notes (Signed)
Inpatient Diabetes Program Recommendations  AACE/ADA: New Consensus Statement on Inpatient Glycemic Control (2015)  Target Ranges:  Prepandial:   less than 140 mg/dL      Peak postprandial:   less than 180 mg/dL (1-2 hours)      Critically ill patients:  140 - 180 mg/dL   Lab Results  Component Value Date   GLUCAP 131 (H) 11/30/2018   HGBA1C 4.3 (L) 11/09/2018    Review of Glycemic Control Results for Tanya Lucero, Tanya Lucero (MRN [id number]) as of 11/30/2018 12:21  Ref. Range 11/30/2018 11:26 11/30/2018 11:57  Glucose-Capillary Latest Ref Range: 70 - 99 mg/dL 28 (LL) 131 (H)   Diabetes history: DM2 Outpatient Diabetes medications: Lantus 55 units am + 25 units pm + Humalog 10 units tid meal coverage + Glucotrol 5 mg bid Current orders for Inpatient glycemic control: No orders  Inpatient Diabetes Program Recommendations:   Please evaluate DM medications @ discharge. Patient's CBG on admission was 28 and increased to 131 post D50 IV. A1c 4.3 although noted hgb 10.1 onn 11/16/18.   Thank you, Nani Gasser. Kamali Nephew, RN, MSN, CDE  Diabetes Coordinator Inpatient Glycemic Control Team Team Pager [phone] (8am-5pm) 11/30/2018 12:30 PM

## 2018-12-04 ENCOUNTER — Other Ambulatory Visit (INDEPENDENT_AMBULATORY_CARE_PROVIDER_SITE_OTHER): Payer: Self-pay | Admitting: *Deleted

## 2018-12-04 ENCOUNTER — Encounter (HOSPITAL_COMMUNITY): Payer: Self-pay | Admitting: Internal Medicine

## 2018-12-04 DIAGNOSIS — K31819 Angiodysplasia of stomach and duodenum without bleeding: Secondary | ICD-10-CM

## 2019-01-05 ENCOUNTER — Encounter: Payer: Self-pay | Admitting: Internal Medicine

## 2019-01-11 ENCOUNTER — Other Ambulatory Visit: Payer: Self-pay

## 2019-01-11 ENCOUNTER — Ambulatory Visit (HOSPITAL_COMMUNITY): Payer: Medicare Other

## 2019-01-11 ENCOUNTER — Ambulatory Visit (HOSPITAL_COMMUNITY)
Admission: RE | Admit: 2019-01-11 | Discharge: 2019-01-11 | Disposition: A | Discharge status: Home / Self Care | Payer: Medicare Other | Attending: Internal Medicine | Admitting: Internal Medicine

## 2019-01-11 ENCOUNTER — Encounter (HOSPITAL_COMMUNITY)
Admission: RE | Disposition: A | Discharge status: Home / Self Care | Payer: Self-pay | Source: Home / Self Care | Attending: Internal Medicine

## 2019-01-11 ENCOUNTER — Encounter (HOSPITAL_COMMUNITY): Payer: Self-pay

## 2019-01-11 DIAGNOSIS — Z992 Dependence on renal dialysis: Secondary | ICD-10-CM | POA: Insufficient documentation

## 2019-01-11 DIAGNOSIS — Z85828 Personal history of other malignant neoplasm of skin: Secondary | ICD-10-CM | POA: Diagnosis not present

## 2019-01-11 DIAGNOSIS — Z880 Allergy status to penicillin: Secondary | ICD-10-CM | POA: Insufficient documentation

## 2019-01-11 DIAGNOSIS — R52 Pain, unspecified: Secondary | ICD-10-CM

## 2019-01-11 DIAGNOSIS — I120 Hypertensive chronic kidney disease with stage 5 chronic kidney disease or end stage renal disease: Secondary | ICD-10-CM | POA: Diagnosis not present

## 2019-01-11 DIAGNOSIS — Z79899 Other long term (current) drug therapy: Secondary | ICD-10-CM | POA: Insufficient documentation

## 2019-01-11 DIAGNOSIS — F329 Major depressive disorder, single episode, unspecified: Secondary | ICD-10-CM | POA: Insufficient documentation

## 2019-01-11 DIAGNOSIS — N186 End stage renal disease: Secondary | ICD-10-CM | POA: Diagnosis not present

## 2019-01-11 DIAGNOSIS — E1122 Type 2 diabetes mellitus with diabetic chronic kidney disease: Secondary | ICD-10-CM | POA: Diagnosis not present

## 2019-01-11 DIAGNOSIS — F419 Anxiety disorder, unspecified: Secondary | ICD-10-CM | POA: Insufficient documentation

## 2019-01-11 DIAGNOSIS — K31819 Angiodysplasia of stomach and duodenum without bleeding: Secondary | ICD-10-CM | POA: Diagnosis not present

## 2019-01-11 DIAGNOSIS — F17210 Nicotine dependence, cigarettes, uncomplicated: Secondary | ICD-10-CM | POA: Diagnosis not present

## 2019-01-11 DIAGNOSIS — D500 Iron deficiency anemia secondary to blood loss (chronic): Secondary | ICD-10-CM | POA: Insufficient documentation

## 2019-01-11 DIAGNOSIS — K219 Gastro-esophageal reflux disease without esophagitis: Secondary | ICD-10-CM | POA: Diagnosis not present

## 2019-01-11 DIAGNOSIS — Z794 Long term (current) use of insulin: Secondary | ICD-10-CM | POA: Insufficient documentation

## 2019-01-11 DIAGNOSIS — E1140 Type 2 diabetes mellitus with diabetic neuropathy, unspecified: Secondary | ICD-10-CM | POA: Insufficient documentation

## 2019-01-11 DIAGNOSIS — M1990 Unspecified osteoarthritis, unspecified site: Secondary | ICD-10-CM | POA: Diagnosis not present

## 2019-01-11 DIAGNOSIS — R0789 Other chest pain: Secondary | ICD-10-CM

## 2019-01-11 HISTORY — PX: ESOPHAGOGASTRODUODENOSCOPY: SHX5428

## 2019-01-11 LAB — TYPE AND SCREEN
ABO/RH(D): O POS
Antibody Screen: NEGATIVE

## 2019-01-11 LAB — GLUCOSE, CAPILLARY
Glucose-Capillary: 138 mg/dL — ABNORMAL HIGH (ref 70–99)
Glucose-Capillary: 37 mg/dL — CL (ref 70–99)
Glucose-Capillary: 79 mg/dL (ref 70–99)

## 2019-01-11 LAB — HEMOGLOBIN AND HEMATOCRIT, BLOOD
HCT: 23.3 % — ABNORMAL LOW (ref 36.0–46.0)
Hemoglobin: 7.3 g/dL — ABNORMAL LOW (ref 12.0–15.0)

## 2019-01-11 SURGERY — EGD (ESOPHAGOGASTRODUODENOSCOPY)
Anesthesia: Moderate Sedation

## 2019-01-11 MED ORDER — FENTANYL CITRATE (PF) 100 MCG/2ML IJ SOLN
INTRAMUSCULAR | Status: AC
  Filled 2019-01-11: qty 2

## 2019-01-11 MED ORDER — DEXTROSE 50 % IV SOLN
1.0000 | Freq: Once | INTRAVENOUS | Status: AC
  Administered 2019-01-11: 50 mL via INTRAVENOUS

## 2019-01-11 MED ORDER — FENTANYL CITRATE (PF) 100 MCG/2ML IJ SOLN
INTRAMUSCULAR | Status: DC | PRN
  Administered 2019-01-11 (×2): 25 ug via INTRAVENOUS

## 2019-01-11 MED ORDER — SODIUM CHLORIDE 0.9% IV SOLUTION: Freq: Once | INTRAVENOUS | Status: DC

## 2019-01-11 MED ORDER — DEXTROSE 50 % IV SOLN
INTRAVENOUS | Status: AC
  Filled 2019-01-11: qty 50

## 2019-01-11 MED ORDER — LIDOCAINE VISCOUS HCL 2 % MT SOLN
OROMUCOSAL | Status: DC | PRN
  Administered 2019-01-11: 1 via OROMUCOSAL

## 2019-01-11 MED ORDER — MIDAZOLAM HCL 5 MG/5ML IJ SOLN
INTRAMUSCULAR | Status: AC
  Filled 2019-01-11: qty 10

## 2019-01-11 MED ORDER — LIDOCAINE VISCOUS HCL 2 % MT SOLN
OROMUCOSAL | Status: AC
  Filled 2019-01-11: qty 15

## 2019-01-11 MED ORDER — MIDAZOLAM HCL 5 MG/5ML IJ SOLN
INTRAMUSCULAR | Status: DC | PRN
  Administered 2019-01-11: 2 mg via INTRAVENOUS
  Administered 2019-01-11 (×3): 1 mg via INTRAVENOUS

## 2019-01-11 MED ORDER — SODIUM CHLORIDE 0.9 % IV SOLN
INTRAVENOUS | Status: DC
  Administered 2019-01-11: 20 mL via INTRAVENOUS

## 2019-01-11 NOTE — Op Note (Addendum)
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 Providers:                Hildred Laser, MD, Janeece Riggers, RN, Raphael Gibney,                            Technician, Randa Spike, Technician Referring MD:             [name], DO Medicines:                Lidocaine spray, Fentanyl 50 micrograms IV,                            Midazolam 4 mg IV Complications:            No immediate complications. Estimated Blood Loss:     Estimated blood loss: none. Procedure:                Pre-Anesthesia Assessment:                           - Prior to the procedure, a History and Physical                            was performed, and patient medications and                            allergies were reviewed. The patient's tolerance of                            previous anesthesia was also reviewed. The risks                            and benefits of the procedure and the sedation                            options and risks were discussed with the patient.                            All questions were answered, and informed consent                            was obtained. Prior Anticoagulants: The patient has                            taken no previous anticoagulant or antiplatelet  agents. ASA Grade Assessment: III - A patient with                            severe systemic disease. After reviewing the risks                            and benefits, the patient was deemed in                            satisfactory condition to undergo the procedure.                           After obtaining informed consent, the endoscope was   passed under direct vision. Throughout the                            procedure, the patient's blood pressure, pulse, and                            oxygen saturations were monitored continuously. The                            GIF-H190 ([id number]) scope was introduced through the                            mouth, and advanced to the second part of duodenum.                            The upper GI endoscopy was accomplished without                            difficulty. The patient tolerated the procedure                            well. The upper GI endoscopy was accomplished                            without difficulty. The patient tolerated the                            procedure well. Scope In: 12:43:22 PM Scope Out: 12:55:07 PM Total Procedure Duration: 0 hours 11 minutes 45 seconds  Findings:      The examined esophagus was normal.      The Z-line was regular and was found 43 cm from the incisors.      Mild gastric antral vascular ectasia without bleeding was present in the       gastric antrum. Coagulation for bleeding prevention using argon plasma       was successful.      The exam of the stomach was otherwise normal.      The duodenal bulb and second portion of the duodenum were normal. Impression:               - Normal esophagus.                           -  Z-line regular, 43 cm from the incisors.                           - Gastric antral vascular ectasia without bleeding.                            Treated with argon plasma coagulation (APC).                           - Normal duodenal bulb and second portion of the                            duodenum.                           - No specimens collected. Moderate Sedation:      Moderate (conscious) sedation was administered by the endoscopy nurse       and supervised by the endoscopist. The following parameters were       monitored: oxygen saturation, heart rate, blood pressure, CO2       capnography and response to care.  Total physician intraservice time was       16 minutes. Recommendation:           - Patient has a contact number available for                            emergencies. The signs and symptoms of potential                            delayed complications were discussed with the                            patient. Return to normal activities tomorrow.                            Written discharge instructions were provided to the                            patient.                           - Resume previous diet today.                           - Continue present medications.                           - Check hemoglobin and hematocrit today.                           - Potable Chest 2 views( chest pain since fall this                            morning). Procedure Code(s):        --- Professional ---  43255, Esophagogastroduodenoscopy, flexible,                            transoral; with control of bleeding, any method                           G0500, Moderate sedation services provided by the                            same physician or other qualified health care                            professional performing a gastrointestinal                            endoscopic service that sedation supports,                            requiring the presence of an independent trained                            observer to assist in the monitoring of the                            patient's level of consciousness and physiological                            status; initial 15 minutes of intra-service time;                            patient age 35 years or older (additional time may                            be reported with [phone], as appropriate) Diagnosis Code(s):        --- Professional ---                           K31.819, Angiodysplasia of stomach and duodenum                            without bleeding                           D50.0, Iron deficiency anemia secondary  to blood                            loss (chronic) CPT copyright 2019 American Medical Association. All rights reserved. The codes documented in this report are preliminary and upon coder review may  be revised to meet current compliance requirements. Hildred Laser, MD Hildred Laser, MD 01/11/2019 1:15:28 PM This report has been signed electronically. Number of Addenda: 0

## 2019-01-11 NOTE — Progress Notes (Signed)
Jarrett Soho [facility] transportation for [employer] Notified patient ready for discharge.

## 2019-01-11 NOTE — Progress Notes (Signed)
Report called to [place] at [facility]. Copy of discharge instructions , cxr and knee xray report copy with patient, procedure report all sent with patient.

## 2019-01-11 NOTE — Progress Notes (Signed)
Patient states "I fell this morning because my blood sugar was low, they gave me some orange juice, pain around my left ribs, a little short of breath, my right leg hurts, and left 4th and 5th finger".  No bruising noted left flank, right leg, or left 4th and 5th fingers.

## 2019-01-11 NOTE — Progress Notes (Signed)
Dr. Laural Golden in to examine patient after CXR. Order received for knee xray.

## 2019-01-11 NOTE — Progress Notes (Signed)
Accucheck 37.  IV started, Dr. Laural Golden notified via telephone. Order received. Patient alert, oriented x 4.

## 2019-01-11 NOTE — H&P (Addendum)
Tanya Lucero is an 68 y.o. female.   Chief Complaint: Patient is here for therapeutic EGD. HPI: Patient 68 year old Caucasian female with multiple medical problems including diabetes mellitus end-stage renal disease on hemodialysis also has iron deficiency anemia due to chronic GI blood loss determined to be due to gastric antral vascular ectasia.  Her last EGD with APC ablation was on 11/30/2018.  She believes her last hemoglobin was in 8 range 9 days ago.  While she has not had frank melena or rectal bleeding her stools at times have been very dark.  She does not take p.o. iron.  She gets parenteral iron at the time of hemodialysis.  She denies nausea vomiting or abdominal pain. Patient says she fell this morning while she was standing up.  She fell on the left side.  She has noticed some pain in right posterior chest close to her axilla.  She does not have dyspnea. Patient blood glucose was 37.  She was given an amp of D50 and it came up to 138.  Patient was given insulin this morning even though she was fasting.  Past Medical History:  Diagnosis Date  . Anemia   . Anxiety   . Arthritis   . Chronic kidney disease    neuphrotic syndrome-05/2013  . Constipation   . Decreased vision    left eye  . Depression   . Diabetes mellitus    Type 2  . Diabetic neuropathy ([place])   . Diverticulitis   . Dry skin   . GERD (gastroesophageal reflux disease)   . GI bleed 07/05/2017  . Gout   . Headache   . Hypertension   . Neuropathy   . Shortness of breath dyspnea   . Skin cancer     Past Surgical History:  Procedure Laterality Date  . ABDOMINAL HYSTERECTOMY    . ADENOIDECTOMY    . APPENDECTOMY     taken out with hysterectomy  . AV FISTULA PLACEMENT Right 09/13/2014   Procedure: Creation of Right Arm RADIOCEPHALIC ARTERIOVENOUS (AV) FISTULA ;  Surgeon: [name], MD;  Location: [facility];  Service: Vascular;  Laterality: Right;  . BACK SURGERY     lumbar -   . CATARACT EXTRACTION W/PHACO  Left 07/02/2013   Procedure: CATARACT EXTRACTION PHACO AND INTRAOCULAR LENS PLACEMENT (IOC);  Surgeon: [name], MD;  [facility];  Service: Ophthalmology;  Laterality: Left;  CDE:  6.46  . CATARACT EXTRACTION W/PHACO Right 07/12/2013   Procedure: CATARACT EXTRACTION PHACO AND INTRAOCULAR LENS PLACEMENT (IOC);  Surgeon: [name], MD;  Location: [facility];  Service: Ophthalmology;  Laterality: Right;  CDE:12.48  . COLONOSCOPY N/A 12/11/2015   Procedure: COLONOSCOPY;  Surgeon: [name], MD;  [facility];  Service: Endoscopy;  Laterality: N/A;  1:55  . ESOPHAGOGASTRODUODENOSCOPY N/A 03/11/2016   Procedure: ESOPHAGOGASTRODUODENOSCOPY (EGD);  Surgeon: [name], MD;  [facility];  Service: Endoscopy;  Laterality: N/A;  1230 - Last pt of the day-Dr. Laural Golden going to office to see pt's  . ESOPHAGOGASTRODUODENOSCOPY N/A 07/21/2017   Procedure: ESOPHAGOGASTRODUODENOSCOPY (EGD);  Surgeon: [name], MD;  [facility];  Service: Endoscopy;  Laterality: N/A;  3:00  . ESOPHAGOGASTRODUODENOSCOPY N/A 11/30/2018   Procedure: ESOPHAGOGASTRODUODENOSCOPY (EGD);  Surgeon: [name], MD;  [facility];  Service: Endoscopy;  Laterality: N/A;  9:55 - spoke with the facility and advised new arrival time 10:20  . FOOT SURGERY Right    x2-heel  spur   . GIVENS CAPSULE STUDY N/A 11/09/2016   Procedure: GIVENS CAPSULE STUDY;  Surgeon: [name], MD;  [facility];  Service: Endoscopy;  Laterality: N/A;  7:30  . GIVENS CAPSULE STUDY N/A 11/10/2018   Procedure: GIVENS CAPSULE STUDY;  Surgeon: [name], MD;  [facility];  Service: Endoscopy;  Laterality: N/A;  . REVISON OF ARTERIOVENOUS FISTULA Right 01/05/2016   Procedure: REVISON OF ARTERIOVENOUS FISTULA- right forearm;  Surgeon: [name], MD;  Location: [place];  Service: Vascular;  Laterality: Right;  . SKIN CANCER EXCISION     forehead  . TONSILLECTOMY      Family  History  Problem Relation Age of Onset  . Multiple myeloma Mother   . Diabetes Mother   . Heart attack Father 43   Social History:  reports that she has been smoking cigarettes. She has a 10.00 pack-year smoking history. She has never used smokeless tobacco. She reports that she does not drink alcohol or use drugs.  Allergies:  Allergies  Allergen Reactions  . Penicillins Rash    Did it involve swelling of the face/tongue/throat, SOB, or low BP? Unknown Did it involve sudden or severe rash/hives, skin peeling, or any reaction on the inside of your mouth or nose? Unknown Did you need to seek medical attention at a hospital or doctor's office? Unknown When did it last happen?unknown If all above answers are "NO", may proceed with cephalosporin use.      Medications Prior to Admission  Medication Sig Dispense Refill  . acetaminophen (TYLENOL) 500 MG tablet Take 1,000 mg every 6 (six) hours as needed by mouth (FOR HEADACHES/MINOR DISCOMFORT).    Marland Kitchen ALPRAZolam (XANAX) 0.25 MG tablet Take 0.25 mg by mouth 2 (two) times daily.     Marland Kitchen aluminum-magnesium hydroxide-simethicone (MAALOX) 767-209-47 MG/5ML SUSP Take 30 mLs by mouth 4 (four) times daily as needed (heartburn/indigestion).    Marland Kitchen amLODipine (NORVASC) 5 MG tablet Take 5 mg by mouth at bedtime.     Marland Kitchen anti-nausea (EMETROL) solution Take 30 mLs every 15 (fifteen) minutes as needed by mouth for nausea or vomiting (X4 doses only).    Marland Kitchen b complex vitamins capsule Take 1 capsule by mouth daily.     Marland Kitchen buPROPion (WELLBUTRIN) 75 MG tablet Take 75 mg by mouth daily.     . carvedilol (COREG) 6.25 MG tablet Take 6.25 mg by mouth 2 (two) times a day.     . cholecalciferol (VITAMIN D) 1000 units tablet Take 1,000 Units by mouth daily.    . fexofenadine (ALLEGRA) 180 MG tablet Take 180 mg by mouth daily.     . furosemide (LASIX) 20 MG tablet Take 20 mg by mouth at bedtime as needed (swelling).     . furosemide (LASIX) 40 MG tablet Take 40 mg by  mouth daily.     Marland Kitchen gabapentin (NEURONTIN) 300 MG capsule Take 300-600 mg by mouth See admin instructions. Take 300 mg in the morning and afternoon, take 600 mg at bedtime    . glipiZIDE (GLUCOTROL) 5 MG tablet Take 5 mg by mouth 2 (two) times a day.     . guaifenesin (ROBITUSSIN) 100 MG/5ML syrup Take 200 mg every 6 (six) hours as needed by mouth for cough.    Marland Kitchen guaiFENesin-codeine (ROBITUSSIN AC) 100-10 MG/5ML syrup Take 5 mLs by mouth every 6 (six) hours as needed for cough.    . hydrALAZINE (APRESOLINE) 25 MG tablet Take 25 mg by  mouth 3 (three) times daily. *HOLD MORNING DOSE ON DIALYSIS DAYS, MONDAYS, WEDNESDAYS, AND FRIDAYS    . insulin glargine (LANTUS) 100 unit/mL SOPN Inject 25-55 Units into the skin See admin instructions. 55 units in the morning and 25 units in the evening    . insulin lispro (HUMALOG) 100 UNIT/ML KiwkPen Inject 10 Units 3 (three) times daily before meals into the skin.    Marland Kitchen insulin regular (NOVOLIN R,HUMULIN R) 100 units/mL injection Inject 15 Units into the skin daily as needed for high blood sugar (for blood sugar levels over 400).    Marland Kitchen loperamide (IMODIUM A-D) 2 MG tablet Take 2 mg by mouth as needed for diarrhea or loose stools (up to 8 doses in 24 hrs.).     Marland Kitchen Melatonin 5 MG CAPS Take 10 mg by mouth at bedtime.     . pantoprazole (PROTONIX) 40 MG tablet Take 1 tablet (40 mg total) by mouth 2 (two) times daily.    . polyvinyl alcohol (LIQUIFILM TEARS) 1.4 % ophthalmic solution Place 1 drop into both eyes every 4 (four) hours as needed for dry eyes.     Marland Kitchen sertraline (ZOLOFT) 50 MG tablet Take 75 mg by mouth daily.     . sevelamer carbonate (RENVELA) 800 MG tablet Take 1,600-2,400 mg by mouth See admin instructions. 2400 mg 3 times daily with meals and 1600 mg twice daily with snacks      Results for orders placed or performed during the hospital encounter of 01/11/19 (from the past 48 hour(s))  Glucose, capillary     Status: Abnormal   Collection Time: 01/11/19  11:31 AM  Result Value Ref Range   Glucose-Capillary 37 (LL) 70 - 99 mg/dL   Comment 1 DPP   Glucose, capillary     Status: Abnormal   Collection Time: 01/11/19 12:15 PM  Result Value Ref Range   Glucose-Capillary 138 (H) 70 - 99 mg/dL   No results found.  ROS  Blood pressure (!) 123/46, pulse 67, temperature 98.7 F (37.1 C), temperature source Oral, resp. rate 15, height '5\' 2"'  (1.575 m), weight 99.8 kg, SpO2 94 %. Physical Exam  Constitutional:  Well-developed obese Caucasian female in NAD.  HENT:  Mouth/Throat: Oropharynx is clear and moist.  Eyes: No scleral icterus.  Conjunctivae is pale.  Neck: No thyromegaly present.  Cardiovascular: Normal rate, regular rhythm and normal heart sounds.  Grade 2/6 systolic ejection murmur best heard at aortic area.  Respiratory: Effort normal and breath sounds normal.  He does not have any bruise or skin tear over posterior chest.  GI:  Abdomen is full.  Soft and nontender with no organomegaly or masses.  She has Pfannenstiel scar.  Musculoskeletal:        General: No edema.  Lymphadenopathy:    She has no cervical adenopathy.  Neurological: She is alert.  Skin: Skin is warm and dry.     Assessment/Plan Iron deficiency anemia secondary to chronic GI blood loss from gastric antral vascular ectasia. EGD with APC ablation of gastric antral vascular ectasia.  Hildred Laser, MD 01/11/2019, 12:28 PM

## 2019-01-11 NOTE — Progress Notes (Signed)
Per Covid-19 blood protocol, Dr. Laural Golden notified via phone, no blood transfusions with Hemoglobin above 7.  Telephone order to cancel Blood Administration received.  Blood bank notified.

## 2019-01-11 NOTE — Progress Notes (Signed)
Radiology in for CXR. Patient c/o right knee pain. No bruising and right pedal pulse palpable. Dr. Laural Golden notified.

## 2019-01-11 NOTE — Discharge Instructions (Signed)
Resume usual medications and diet. No driving for 24 hours. Hemoglobin to be checked today. Please call if you have rectal bleeding or tarry stools.         Upper Endoscopy, Adult, Care After This sheet gives you information about how to care for yourself after your procedure. Your health care provider may also give you more specific instructions. If you have problems or questions, contact your health care provider. What can I expect after the procedure? After the procedure, it is common to have:  A sore throat.  Mild stomach pain or discomfort.  Bloating.  Nausea. Follow these instructions at home:   Follow instructions from your health care provider about what to eat or drink after your procedure.  Return to your normal activities as told by your health care provider. Ask your health care provider what activities are safe for you.  Take over-the-counter and prescription medicines only as told by your health care provider.  Do not drive for 24 hours if you were given a sedative during your procedure.  Keep all follow-up visits as told by your health care provider. This is important. Contact a health care provider if you have:  A sore throat that lasts longer than one day.  Trouble swallowing. Get help right away if:  You vomit blood or your vomit looks like coffee grounds.  You have: ? A fever. ? Bloody, black, or tarry stools. ? A severe sore throat or you cannot swallow. ? Difficulty breathing. ? Severe pain in your chest or abdomen. Summary  After the procedure, it is common to have a sore throat, mild stomach discomfort, bloating, and nausea.  Do not drive for 24 hours if you were given a sedative during the procedure.  Follow instructions from your health care provider about what to eat or drink after your procedure.  Return to your normal activities as told by your health care provider. This information is not intended to replace advice given to you  by your health care provider. Make sure you discuss any questions you have with your health care provider. Document Released: 02/22/2012 Document Revised: 01/23/2018 Document Reviewed: 01/23/2018 Elsevier Interactive Patient Education  2019 [place], Care After These instructions provide you with information about caring for yourself after your procedure. Your health care provider may also give you more specific instructions. Your treatment has been planned according to current medical practices, but problems sometimes occur. Call your health care provider if you have any problems or questions after your procedure. What can I expect after the procedure? After your procedure, you may:  Feel sleepy for several hours.  Feel clumsy and have poor balance for several hours.  Feel forgetful about what happened after the procedure.  Have poor judgment for several hours.  Feel nauseous or vomit.  Have a sore throat if you had a breathing tube during the procedure. Follow these instructions at home: For at least 24 hours after the procedure:      Have a responsible adult stay with you. It is important to have someone help care for you until you are awake and alert.  Rest as needed.  Do not: ? Participate in activities in which you could fall or become injured. ? Drive. ? Use heavy machinery. ? Drink alcohol. ? Take sleeping pills or medicines that cause drowsiness. ? Make important decisions or sign legal documents. ? Take care of children on your own. Eating and drinking  Follow  the diet that is recommended by your health care provider.  If you vomit, drink water, juice, or soup when you can drink without vomiting.  Make sure you have little or no nausea before eating solid foods. General instructions  Take over-the-counter and prescription medicines only as told by your health care provider.  If you have sleep apnea, surgery and certain  medicines can increase your risk for breathing problems. Follow instructions from your health care provider about wearing your sleep device: ? Anytime you are sleeping, including during daytime naps. ? While taking prescription pain medicines, sleeping medicines, or medicines that make you drowsy.  If you smoke, do not smoke without supervision.  Keep all follow-up visits as told by your health care provider. This is important. Contact a health care provider if:  You keep feeling nauseous or you keep vomiting.  You feel light-headed.  You develop a rash.  You have a fever. Get help right away if:  You have trouble breathing. Summary  For several hours after your procedure, you may feel sleepy and have poor judgment.  Have a responsible adult stay with you for at least 24 hours or until you are awake and alert. This information is not intended to replace advice given to you by your health care provider. Make sure you discuss any questions you have with your health care provider. Document Released: 12/14/2015 Document Revised: 04/08/2017 Document Reviewed: 12/14/2015 Elsevier Interactive Patient Education  2019 [employer].

## 2019-01-11 NOTE — Progress Notes (Signed)
Call to [facility] Management at [facility].  Kaitlyn Cathey [facility] in conference call. Advised Tammy that Patient unable to have blood transfusion as scheduled for 01/12/2019 due to Covid-19 protocol.  Tammy will contact patients Dialysis center so she can resume dialysis as planned.

## 2019-01-11 NOTE — Progress Notes (Signed)
Portable chest film negative for rib rib fracture. Right knee x-ray from show a small effusion but no fracture. Patient's hemoglobin is 7.3 g. He is hemodynamically stable. Has a chronic/intermittent bleed. He will be Toppin screened today and received 1 unit of PRBCs tomorrow morning.   I also talked with Olivia Mackie at dialysis center; patient's timing has been changed and she should be at the facility noted later than 12:30 PM tomorrow.

## 2019-01-12 ENCOUNTER — Encounter (HOSPITAL_COMMUNITY): Admit: 2019-01-12 | Payer: Medicare Other

## 2019-01-16 ENCOUNTER — Encounter (HOSPITAL_COMMUNITY): Payer: Self-pay | Admitting: Internal Medicine

## 2019-02-06 ENCOUNTER — Ambulatory Visit (INDEPENDENT_AMBULATORY_CARE_PROVIDER_SITE_OTHER): Payer: Medicare Other | Admitting: Internal Medicine

## 2019-02-06 ENCOUNTER — Other Ambulatory Visit: Payer: Self-pay

## 2019-02-06 ENCOUNTER — Encounter (INDEPENDENT_AMBULATORY_CARE_PROVIDER_SITE_OTHER): Payer: Self-pay | Admitting: Internal Medicine

## 2019-02-06 ENCOUNTER — Telehealth (INDEPENDENT_AMBULATORY_CARE_PROVIDER_SITE_OTHER): Payer: Self-pay | Admitting: Internal Medicine

## 2019-02-06 DIAGNOSIS — K922 Gastrointestinal hemorrhage, unspecified: Secondary | ICD-10-CM

## 2019-02-06 DIAGNOSIS — K31819 Angiodysplasia of stomach and duodenum without bleeding: Secondary | ICD-10-CM | POA: Diagnosis not present

## 2019-02-06 NOTE — Telephone Encounter (Signed)
H and H ordered

## 2019-02-06 NOTE — Telephone Encounter (Signed)
Please call Jenny Reichmann at [facility] regarding patients [place]   -  Ph# [phone]

## 2019-02-06 NOTE — Telephone Encounter (Signed)
err

## 2019-02-06 NOTE — Progress Notes (Signed)
Patient ID: Tanya Lucero, female   DOB: [birth year], 68 y.o.   MRN: [id number]

## 2019-02-06 NOTE — Progress Notes (Signed)
Subjective:    Patient ID: Tanya Lucero, female    DOB: 1950/12/22, 68 y.o.   MRN: [id number] Start time 1032am-1042. Total time 10 minutes.  HPI This is a telephone OV. Patient is at home. I am in the office. Patient agrees with this telephone OV. Unable to do video OV. Telephone OV due to the risk of COVID-19.  Her lat EGD was in May of this year.      The duodenal bulb and second portion of the duodenum were normal. Impression:               - Normal esophagus.                           - Z-line regular, 43 cm from the incisors.                           - Gastric antral vascular ectasia without bleeding.                            Treated with argon plasma coagulation (APC).                           - Normal duodenal bulb and second portion of the                            duodenum.                           - No specimens collected. She tells me she is doing good. She says she will have diarrhea at times and takes Imodium has needed. She has not lost any weight. Her appetite is good. Her BMs are dark brown. She cannot tell me if she has seen any blood because of her poor eye sight.  Hx of diabetes, hypertension and ESR, IDA  Dialysis M-W-F  Review of Systems Past Medical History:  Diagnosis Date  . Anemia   . Anxiety   . Arthritis   . Chronic kidney disease    neuphrotic syndrome-05/2013  . Constipation   . Decreased vision    left eye  . Depression   . Diabetes mellitus    Type 2  . Diabetic neuropathy ([place])   . Diverticulitis   . Dry skin   . GERD (gastroesophageal reflux disease)   . GI bleed 07/05/2017  . Gout   . Headache   . Hypertension   . Neuropathy   . Shortness of breath dyspnea   . Skin cancer     Past Surgical History:  Procedure Laterality Date  . ABDOMINAL HYSTERECTOMY    . ADENOIDECTOMY    . APPENDECTOMY     taken out with hysterectomy  . AV FISTULA PLACEMENT Right 09/13/2014   Procedure: Creation of Right Arm RADIOCEPHALIC ARTERIOVENOUS  (AV) FISTULA ;  Surgeon: [name], MD;  Location: [facility];  Service: Vascular;  Laterality: Right;  . BACK SURGERY     lumbar -   . CATARACT EXTRACTION W/PHACO Left 07/02/2013   Procedure: CATARACT EXTRACTION PHACO AND INTRAOCULAR LENS PLACEMENT (IOC);  Surgeon: [name], MD;  [facility];  Service: Ophthalmology;  Laterality: Left;  CDE:  6.46  . CATARACT EXTRACTION W/PHACO Right 07/12/2013  Procedure: CATARACT EXTRACTION PHACO AND INTRAOCULAR LENS PLACEMENT (IOC);  Surgeon: [name], MD;  [facility];  Service: Ophthalmology;  Laterality: Right;  CDE:12.48  . COLONOSCOPY N/A 12/11/2015   Procedure: COLONOSCOPY;  Surgeon: [name], MD;  [facility];  Service: Endoscopy;  Laterality: N/A;  1:55  . ESOPHAGOGASTRODUODENOSCOPY N/A 03/11/2016   Procedure: ESOPHAGOGASTRODUODENOSCOPY (EGD);  Surgeon: [name], MD;  [facility];  Service: Endoscopy;  Laterality: N/A;  1230 - Last pt of the day-Dr. Laural Golden going to office to see pt's  . ESOPHAGOGASTRODUODENOSCOPY N/A 07/21/2017   Procedure: ESOPHAGOGASTRODUODENOSCOPY (EGD);  Surgeon: [name], MD;  [facility];  Service: Endoscopy;  Laterality: N/A;  3:00  . ESOPHAGOGASTRODUODENOSCOPY N/A 11/30/2018   Procedure: ESOPHAGOGASTRODUODENOSCOPY (EGD);  Surgeon: [name], MD;  [facility];  Service: Endoscopy;  Laterality: N/A;  9:55 - spoke with the facility and advised new arrival time 10:20  . ESOPHAGOGASTRODUODENOSCOPY N/A 01/11/2019   Procedure: ESOPHAGOGASTRODUODENOSCOPY (EGD);  Surgeon: [name], MD;  [facility];  Service: Endoscopy;  Laterality: N/A;  155 - office spoke with facility and they know to have pt here at 11:00  . FOOT SURGERY Right    x2-heel spur   . GIVENS CAPSULE STUDY N/A 11/09/2016   Procedure: GIVENS CAPSULE STUDY;  Surgeon: [name], MD;  [facility];  Service: Endoscopy;  Laterality: N/A;  7:30  . GIVENS  CAPSULE STUDY N/A 11/10/2018   Procedure: GIVENS CAPSULE STUDY;  Surgeon: [name], MD;  [facility];  Service: Endoscopy;  Laterality: N/A;  . REVISON OF ARTERIOVENOUS FISTULA Right 01/05/2016   Procedure: REVISON OF ARTERIOVENOUS FISTULA- right forearm;  Surgeon: [name], MD;  Location: [facility];  Service: Vascular;  Laterality: Right;  . SKIN CANCER EXCISION     forehead  . TONSILLECTOMY      Allergies  Allergen Reactions  . Penicillins Rash    Did it involve swelling of the face/tongue/throat, SOB, or low BP? Unknown Did it involve sudden or severe rash/hives, skin peeling, or any reaction on the inside of your mouth or nose? Unknown Did you need to seek medical attention at a hospital or doctor's office? Unknown When did it last happen?unknown If all above answers are "NO", may proceed with cephalosporin use.      Current Outpatient Medications on File Prior to Visit  Medication Sig Dispense Refill  . acetaminophen (TYLENOL) 500 MG tablet Take 1,000 mg every 6 (six) hours as needed by mouth (FOR HEADACHES/MINOR DISCOMFORT).    Marland Kitchen ALPRAZolam (XANAX) 0.25 MG tablet Take 0.25 mg by mouth 2 (two) times daily.     Marland Kitchen aluminum-magnesium hydroxide-simethicone (MAALOX) 448-185-63 MG/5ML SUSP Take 30 mLs by mouth 4 (four) times daily as needed (heartburn/indigestion).    Marland Kitchen amLODipine (NORVASC) 5 MG tablet Take 5 mg by mouth at bedtime.     Marland Kitchen anti-nausea (EMETROL) solution Take 30 mLs every 15 (fifteen) minutes as needed by mouth for nausea or vomiting (X4 doses only).    Marland Kitchen b complex vitamins capsule Take 1 capsule by mouth daily.     Marland Kitchen buPROPion (WELLBUTRIN) 75 MG tablet Take 75 mg by mouth daily.     . carvedilol (COREG) 6.25 MG tablet Take 6.25 mg by mouth 2 (two) times a day.     . cholecalciferol (VITAMIN D) 1000 units tablet Take 1,000 Units by mouth daily.    Marland Kitchen  fexofenadine (ALLEGRA) 180 MG tablet Take 180 mg by mouth daily.     . furosemide (LASIX) 20 MG  tablet Take 20 mg by mouth at bedtime as needed (swelling).     . furosemide (LASIX) 40 MG tablet Take 40 mg by mouth daily.     Marland Kitchen gabapentin (NEURONTIN) 300 MG capsule Take 300-600 mg by mouth See admin instructions. Take 300 mg in the morning and afternoon, take 600 mg at bedtime    . glipiZIDE (GLUCOTROL) 5 MG tablet Take 5 mg by mouth 2 (two) times a day.     . guaifenesin (ROBITUSSIN) 100 MG/5ML syrup Take 200 mg every 6 (six) hours as needed by mouth for cough.    Marland Kitchen guaiFENesin-codeine (ROBITUSSIN AC) 100-10 MG/5ML syrup Take 5 mLs by mouth every 6 (six) hours as needed for cough.    . hydrALAZINE (APRESOLINE) 25 MG tablet Take 25 mg by mouth 3 (three) times daily. *HOLD MORNING DOSE ON DIALYSIS DAYS, MONDAYS, WEDNESDAYS, AND FRIDAYS    . insulin glargine (LANTUS) 100 unit/mL SOPN Inject 25-55 Units into the skin See admin instructions. 55 units in the morning and 25 units in the evening    . insulin lispro (HUMALOG) 100 UNIT/ML KiwkPen Inject 10 Units 3 (three) times daily before meals into the skin.    Marland Kitchen insulin regular (NOVOLIN R,HUMULIN R) 100 units/mL injection Inject 15 Units into the skin daily as needed for high blood sugar (for blood sugar levels over 400).    Marland Kitchen loperamide (IMODIUM A-D) 2 MG tablet Take 2 mg by mouth as needed for diarrhea or loose stools (up to 8 doses in 24 hrs.).     Marland Kitchen Melatonin 5 MG CAPS Take 10 mg by mouth at bedtime.     . pantoprazole (PROTONIX) 40 MG tablet Take 1 tablet (40 mg total) by mouth 2 (two) times daily.    . polyvinyl alcohol (LIQUIFILM TEARS) 1.4 % ophthalmic solution Place 1 drop into both eyes every 4 (four) hours as needed for dry eyes.     Marland Kitchen sertraline (ZOLOFT) 50 MG tablet Take 75 mg by mouth daily.     . sevelamer carbonate (RENVELA) 800 MG tablet Take 1,600-2,400 mg by mouth See admin instructions. 2400 mg 3 times daily with meals and 1600 mg twice daily with snacks     No current facility-administered medications on file prior to visit.          Objective:   Physical Exam There were no vitals taken for this visit.          Assessment & Plan:  UGI bleed. Hx of GAVE. Will get an H and H on her . OV in 3 months.

## 2019-02-08 NOTE — Telephone Encounter (Signed)
This has been addressed.

## 2019-02-24 ENCOUNTER — Encounter (HOSPITAL_COMMUNITY): Payer: Self-pay | Admitting: Emergency Medicine

## 2019-02-24 ENCOUNTER — Other Ambulatory Visit: Payer: Self-pay

## 2019-02-24 ENCOUNTER — Inpatient Hospital Stay (HOSPITAL_COMMUNITY)
Admission: EM | Admit: 2019-02-24 | Discharge: 2019-02-28 | DRG: 377 | Disposition: A | Discharge status: Nursing Home (Includes ICF) | Payer: Medicare Other | Attending: Family Medicine | Admitting: Family Medicine

## 2019-02-24 DIAGNOSIS — Z833 Family history of diabetes mellitus: Secondary | ICD-10-CM

## 2019-02-24 DIAGNOSIS — E1165 Type 2 diabetes mellitus with hyperglycemia: Secondary | ICD-10-CM | POA: Diagnosis present

## 2019-02-24 DIAGNOSIS — N186 End stage renal disease: Secondary | ICD-10-CM | POA: Diagnosis not present

## 2019-02-24 DIAGNOSIS — Z90710 Acquired absence of both cervix and uterus: Secondary | ICD-10-CM

## 2019-02-24 DIAGNOSIS — Z85828 Personal history of other malignant neoplasm of skin: Secondary | ICD-10-CM

## 2019-02-24 DIAGNOSIS — N2581 Secondary hyperparathyroidism of renal origin: Secondary | ICD-10-CM | POA: Diagnosis present

## 2019-02-24 DIAGNOSIS — D539 Nutritional anemia, unspecified: Secondary | ICD-10-CM | POA: Diagnosis present

## 2019-02-24 DIAGNOSIS — Z807 Family history of other malignant neoplasms of lymphoid, hematopoietic and related tissues: Secondary | ICD-10-CM

## 2019-02-24 DIAGNOSIS — Z8249 Family history of ischemic heart disease and other diseases of the circulatory system: Secondary | ICD-10-CM

## 2019-02-24 DIAGNOSIS — Z1159 Encounter for screening for other viral diseases: Secondary | ICD-10-CM

## 2019-02-24 DIAGNOSIS — E11319 Type 2 diabetes mellitus with unspecified diabetic retinopathy without macular edema: Secondary | ICD-10-CM | POA: Diagnosis present

## 2019-02-24 DIAGNOSIS — Z880 Allergy status to penicillin: Secondary | ICD-10-CM

## 2019-02-24 DIAGNOSIS — D649 Anemia, unspecified: Secondary | ICD-10-CM

## 2019-02-24 DIAGNOSIS — M109 Gout, unspecified: Secondary | ICD-10-CM | POA: Diagnosis present

## 2019-02-24 DIAGNOSIS — F329 Major depressive disorder, single episode, unspecified: Secondary | ICD-10-CM | POA: Diagnosis present

## 2019-02-24 DIAGNOSIS — D500 Iron deficiency anemia secondary to blood loss (chronic): Secondary | ICD-10-CM | POA: Diagnosis present

## 2019-02-24 DIAGNOSIS — Z9842 Cataract extraction status, left eye: Secondary | ICD-10-CM

## 2019-02-24 DIAGNOSIS — D631 Anemia in chronic kidney disease: Secondary | ICD-10-CM | POA: Diagnosis present

## 2019-02-24 DIAGNOSIS — Z79890 Hormone replacement therapy (postmenopausal): Secondary | ICD-10-CM

## 2019-02-24 DIAGNOSIS — E1121 Type 2 diabetes mellitus with diabetic nephropathy: Secondary | ICD-10-CM

## 2019-02-24 DIAGNOSIS — E1142 Type 2 diabetes mellitus with diabetic polyneuropathy: Secondary | ICD-10-CM | POA: Diagnosis not present

## 2019-02-24 DIAGNOSIS — E8889 Other specified metabolic disorders: Secondary | ICD-10-CM | POA: Diagnosis present

## 2019-02-24 DIAGNOSIS — F17210 Nicotine dependence, cigarettes, uncomplicated: Secondary | ICD-10-CM | POA: Diagnosis present

## 2019-02-24 DIAGNOSIS — M1990 Unspecified osteoarthritis, unspecified site: Secondary | ICD-10-CM | POA: Diagnosis present

## 2019-02-24 DIAGNOSIS — E1122 Type 2 diabetes mellitus with diabetic chronic kidney disease: Secondary | ICD-10-CM | POA: Diagnosis present

## 2019-02-24 DIAGNOSIS — I132 Hypertensive heart and chronic kidney disease with heart failure and with stage 5 chronic kidney disease, or end stage renal disease: Secondary | ICD-10-CM | POA: Diagnosis present

## 2019-02-24 DIAGNOSIS — Z961 Presence of intraocular lens: Secondary | ICD-10-CM | POA: Diagnosis present

## 2019-02-24 DIAGNOSIS — K31811 Angiodysplasia of stomach and duodenum with bleeding: Principal | ICD-10-CM | POA: Diagnosis present

## 2019-02-24 DIAGNOSIS — Z9841 Cataract extraction status, right eye: Secondary | ICD-10-CM

## 2019-02-24 DIAGNOSIS — Z79899 Other long term (current) drug therapy: Secondary | ICD-10-CM

## 2019-02-24 DIAGNOSIS — E669 Obesity, unspecified: Secondary | ICD-10-CM | POA: Diagnosis present

## 2019-02-24 DIAGNOSIS — Z6841 Body Mass Index (BMI) 40.0 and over, adult: Secondary | ICD-10-CM

## 2019-02-24 DIAGNOSIS — E1140 Type 2 diabetes mellitus with diabetic neuropathy, unspecified: Secondary | ICD-10-CM | POA: Diagnosis present

## 2019-02-24 DIAGNOSIS — I5033 Acute on chronic diastolic (congestive) heart failure: Secondary | ICD-10-CM | POA: Diagnosis not present

## 2019-02-24 DIAGNOSIS — E11649 Type 2 diabetes mellitus with hypoglycemia without coma: Secondary | ICD-10-CM | POA: Diagnosis not present

## 2019-02-24 DIAGNOSIS — Z992 Dependence on renal dialysis: Secondary | ICD-10-CM

## 2019-02-24 DIAGNOSIS — K219 Gastro-esophageal reflux disease without esophagitis: Secondary | ICD-10-CM | POA: Diagnosis present

## 2019-02-24 DIAGNOSIS — F419 Anxiety disorder, unspecified: Secondary | ICD-10-CM | POA: Diagnosis present

## 2019-02-24 DIAGNOSIS — Z794 Long term (current) use of insulin: Secondary | ICD-10-CM

## 2019-02-24 LAB — CBC WITH DIFFERENTIAL/PLATELET
Abs Immature Granulocytes: 0.01 10*3/uL (ref 0.00–0.07)
Basophils Absolute: 0 10*3/uL (ref 0.0–0.1)
Basophils Relative: 0 %
Eosinophils Absolute: 0.1 10*3/uL (ref 0.0–0.5)
Eosinophils Relative: 2 %
HCT: 21.4 % — ABNORMAL LOW (ref 36.0–46.0)
Hemoglobin: 6.9 g/dL — CL (ref 12.0–15.0)
Immature Granulocytes: 0 %
Lymphocytes Relative: 26 %
Lymphs Abs: 1.4 10*3/uL (ref 0.7–4.0)
MCH: 35.4 pg — ABNORMAL HIGH (ref 26.0–34.0)
MCHC: 32.2 g/dL (ref 30.0–36.0)
MCV: 109.7 fL — ABNORMAL HIGH (ref 80.0–100.0)
Monocytes Absolute: 0.4 10*3/uL (ref 0.1–1.0)
Monocytes Relative: 7 %
Neutro Abs: 3.4 10*3/uL (ref 1.7–7.7)
Neutrophils Relative %: 65 %
Platelets: 139 10*3/uL — ABNORMAL LOW (ref 150–400)
RBC: 1.95 MIL/uL — ABNORMAL LOW (ref 3.87–5.11)
RDW: 17.9 % — ABNORMAL HIGH (ref 11.5–15.5)
WBC: 5.4 10*3/uL (ref 4.0–10.5)
nRBC: 0 % (ref 0.0–0.2)

## 2019-02-24 LAB — COMPREHENSIVE METABOLIC PANEL
ALT: 18 U/L (ref 0–44)
AST: 19 U/L (ref 15–41)
Albumin: 3.8 g/dL (ref 3.5–5.0)
Alkaline Phosphatase: 87 U/L (ref 38–126)
Anion gap: 14 (ref 5–15)
BUN: 41 mg/dL — ABNORMAL HIGH (ref 8–23)
CO2: 27 mmol/L (ref 22–32)
Calcium: 8.5 mg/dL — ABNORMAL LOW (ref 8.9–10.3)
Chloride: 97 mmol/L — ABNORMAL LOW (ref 98–111)
Creatinine, Ser: 6.5 mg/dL — ABNORMAL HIGH (ref 0.44–1.00)
GFR calc Af Amer: 7 mL/min — ABNORMAL LOW (ref >60)
GFR calc non Af Amer: 6 mL/min — ABNORMAL LOW (ref >60)
Glucose, Bld: 79 mg/dL (ref 70–99)
Potassium: 3.6 mmol/L (ref 3.5–5.1)
Sodium: 138 mmol/L (ref 135–145)
Total Bilirubin: 0.5 mg/dL (ref 0.3–1.2)
Total Protein: 6.9 g/dL (ref 6.5–8.1)

## 2019-02-24 MED ORDER — ACETAMINOPHEN 325 MG PO TABS
650.0000 mg | ORAL_TABLET | Freq: Four times a day (QID) | ORAL | Status: DC | PRN
  Administered 2019-02-25 – 2019-02-28 (×6): 650 mg via ORAL
  Filled 2019-02-24 (×6): qty 2

## 2019-02-24 MED ORDER — FUROSEMIDE 10 MG/ML IJ SOLN
60.0000 mg | Freq: Once | INTRAMUSCULAR | Status: AC
  Administered 2019-02-25: 02:00:00 60 mg via INTRAVENOUS
  Filled 2019-02-24: qty 6

## 2019-02-24 MED ORDER — SODIUM CHLORIDE 0.9% FLUSH
3.0000 mL | Freq: Two times a day (BID) | INTRAVENOUS | Status: DC
  Administered 2019-02-25 – 2019-02-28 (×8): 3 mL via INTRAVENOUS

## 2019-02-24 MED ORDER — SODIUM CHLORIDE 0.9% FLUSH: 3.0000 mL | INTRAVENOUS | Status: DC | PRN

## 2019-02-24 MED ORDER — SODIUM CHLORIDE 0.9 % IV SOLN: 10.0000 mL/h | Freq: Once | INTRAVENOUS | Status: DC

## 2019-02-24 MED ORDER — HEPARIN SODIUM (PORCINE) 5000 UNIT/ML IJ SOLN
5000.0000 [IU] | Freq: Three times a day (TID) | INTRAMUSCULAR | Status: DC
  Administered 2019-02-25: 14:00:00 5000 [IU] via SUBCUTANEOUS
  Filled 2019-02-24: qty 1

## 2019-02-24 MED ORDER — ACETAMINOPHEN 650 MG RE SUPP: 650.0000 mg | Freq: Four times a day (QID) | RECTAL | Status: DC | PRN

## 2019-02-24 MED ORDER — SODIUM CHLORIDE 0.9 % IV SOLN: 250.0000 mL | INTRAVENOUS | Status: DC | PRN

## 2019-02-24 NOTE — H&P (Addendum)
TRH H&P    Patient Demographics:    Tanya Lucero, is a 68 y.o. female  MRN: [id number]  DOB - 07/01/1951  Admit Date - 02/24/2019  Referring MD/NP/PA:   Aundria Rud  Outpatient Primary MD for the patient is [name], DO  Patient coming from:  home  Chief complaint- anemia   HPI:    Tanya Lucero  is a 68 y.o. female,w hypertension, Dm2, Diabetic neuropathy, ESRD on HD (M,W, F) at [facility], Diverticulosis on Colonoscopy 12/11/2015, Iron deficiency anemia,  recently admitted for anemia w EGD on 5/72020=> angiodysplasia of the stomach=>argon laser, who presents with low Hgb, told by her nephrologist per ED physician to go to ER for transfusion.  + dyspnea with exertion.  Denies fever, chills, cough, cp, palp, n/v, abd pain, diarrhea, brbpr.    In ED T 99.2, P 81  R 16, Bp 171/67  Pox 95%   Wt 99.8 kg  Wbc 5.4, hgb 6.9 Plt 139 Na 138, K 3.6,  Bun 41, Creatinine 6.5 Calcium 8.5  Pt started on 2 units prbc in ED.   Pt will be admitted for symptomatic anemia.     Review of systems:    In addition to the HPI above,  No Fever-chills, No Headache, No changes with Vision or hearing, No problems swallowing food or Liquids, No Chest pain, Cough No Abdominal pain, No Nausea or Vomiting, bowel movements are regular, No Blood in stool or Urine, No dysuria, No new skin rashes or bruises, No new joints pains-aches,  No new weakness, tingling, numbness in any extremity, No recent weight gain or loss, No polyuria, polydypsia or polyphagia, No significant Mental Stressors.  All other systems reviewed and are negative.    Past History of the following :    Past Medical History:  Diagnosis Date  . Anemia   . Anxiety   . Arthritis   . Chronic kidney disease    neuphrotic syndrome-05/2013  . Constipation   . Decreased vision    left eye  . Depression   . Diabetes mellitus    Type  2  . Diabetic neuropathy ([place])   . Diverticulitis   . Dry skin   . GERD (gastroesophageal reflux disease)   . GI bleed 07/05/2017  . Gout   . Headache   . Hypertension   . Neuropathy   . Shortness of breath dyspnea   . Skin cancer       Past Surgical History:  Procedure Laterality Date  . ABDOMINAL HYSTERECTOMY    . ADENOIDECTOMY    . APPENDECTOMY     taken out with hysterectomy  . AV FISTULA PLACEMENT Right 09/13/2014   Procedure: Creation of Right Arm RADIOCEPHALIC ARTERIOVENOUS (AV) FISTULA ;  Surgeon: [name], MD;  Location: [facility];  Service: Vascular;  Laterality: Right;  . BACK SURGERY     lumbar -   . CATARACT EXTRACTION W/PHACO Left 07/02/2013   Procedure: CATARACT EXTRACTION PHACO AND INTRAOCULAR LENS PLACEMENT (IOC);  Surgeon: [name], MD;  [facility];  Service: Ophthalmology;  Laterality: Left;  CDE:  6.46  . CATARACT EXTRACTION W/PHACO Right 07/12/2013   Procedure: CATARACT EXTRACTION PHACO AND INTRAOCULAR LENS PLACEMENT (IOC);  Surgeon: [name], MD;  [facility];  Service: Ophthalmology;  Laterality: Right;  CDE:12.48  . COLONOSCOPY N/A 12/11/2015   Procedure: COLONOSCOPY;  Surgeon: [name], MD;  [facility];  Service: Endoscopy;  Laterality: N/A;  1:55  . ESOPHAGOGASTRODUODENOSCOPY N/A 03/11/2016   Procedure: ESOPHAGOGASTRODUODENOSCOPY (EGD);  Surgeon: [name], MD;  [facility];  Service: Endoscopy;  Laterality: N/A;  1230 - Last pt of the day-Dr. Laural Golden going to office to see pt's  . ESOPHAGOGASTRODUODENOSCOPY N/A 07/21/2017   Procedure: ESOPHAGOGASTRODUODENOSCOPY (EGD);  Surgeon: [name], MD;  [facility];  Service: Endoscopy;  Laterality: N/A;  3:00  . ESOPHAGOGASTRODUODENOSCOPY N/A 11/30/2018   Procedure: ESOPHAGOGASTRODUODENOSCOPY (EGD);  Surgeon: [name], MD;  [facility];  Service: Endoscopy;  Laterality: N/A;  9:55 - spoke with the facility and advised new arrival  time 10:20  . ESOPHAGOGASTRODUODENOSCOPY N/A 01/11/2019   Procedure: ESOPHAGOGASTRODUODENOSCOPY (EGD);  Surgeon: [name], MD;  [facility];  Service: Endoscopy;  Laterality: N/A;  155 - office spoke with facility and they know to have pt here at 11:00  . FOOT SURGERY Right    x2-heel spur   . GIVENS CAPSULE STUDY N/A 11/09/2016   Procedure: GIVENS CAPSULE STUDY;  Surgeon: [name], MD;  [facility];  Service: Endoscopy;  Laterality: N/A;  7:30  . GIVENS CAPSULE STUDY N/A 11/10/2018   Procedure: GIVENS CAPSULE STUDY;  Surgeon: [name], MD;  [facility];  Service: Endoscopy;  Laterality: N/A;  . REVISON OF ARTERIOVENOUS FISTULA Right 01/05/2016   Procedure: REVISON OF ARTERIOVENOUS FISTULA- right forearm;  Surgeon: [name], MD;  Location: [facility];  Service: Vascular;  Laterality: Right;  . SKIN CANCER EXCISION     forehead  . TONSILLECTOMY        Social History:      Social History   Tobacco Use  . Smoking status: Current Every Day Smoker    Packs/day: 0.25    Years: 40.00    Pack years: 10.00    Types: Cigarettes  . Smokeless tobacco: Never Used  Substance Use Topics  . Alcohol use: No    Alcohol/week: 0.0 standard drinks       Family History :     Family History  Problem Relation Age of Onset  . Multiple myeloma Mother   . Diabetes Mother   . Heart attack Father 32       Home Medications:   Prior to Admission medications   Medication Sig Start Date End Date Taking? Authorizing Provider  acetaminophen (TYLENOL) 500 MG tablet Take 1,000 mg every 6 (six) hours as needed by mouth (FOR HEADACHES/MINOR DISCOMFORT).   Yes [provider]  ALPRAZolam (XANAX) 0.25 MG tablet Take 0.25 mg by mouth 2 (two) times daily.    Yes [provider]  aluminum-magnesium hydroxide-simethicone (MAALOX) 200-200-20 MG/5ML SUSP Take 30 mLs by mouth 4 (four) times daily as needed (heartburn/indigestion).   Yes  [provider]  amLODipine (NORVASC) 5 MG tablet Take 5 mg by mouth at bedtime.    Yes [provider]  anti-nausea (EMETROL) solution Take 30 mLs every 15 (fifteen) minutes as needed by mouth for nausea or vomiting (X4 doses only).  Yes [provider]  b complex vitamins capsule Take 1 capsule by mouth daily.    Yes [provider]  buPROPion (WELLBUTRIN) 75 MG tablet Take 75 mg by mouth daily.    Yes [provider]  carvedilol (COREG) 6.25 MG tablet Take 6.25 mg by mouth 2 (two) times a day.    Yes [provider]  fexofenadine (ALLEGRA) 180 MG tablet Take 180 mg by mouth daily.    Yes [provider]  furosemide (LASIX) 20 MG tablet Take 20 mg by mouth at bedtime as needed (swelling).    Yes [provider]  furosemide (LASIX) 40 MG tablet Take 40 mg by mouth daily.    Yes [provider]  gabapentin (NEURONTIN) 300 MG capsule Take 300-600 mg by mouth See admin instructions. Take 300 mg in the morning and afternoon, take 600 mg at bedtime   Yes [provider]  glipiZIDE (GLUCOTROL) 5 MG tablet Take 5 mg by mouth 2 (two) times a day.    Yes [provider]  guaifenesin (ROBITUSSIN) 100 MG/5ML syrup Take 200 mg every 6 (six) hours as needed by mouth for cough.   Yes [provider]  guaiFENesin-codeine (ROBITUSSIN AC) 100-10 MG/5ML syrup Take 5 mLs by mouth every 6 (six) hours as needed for cough.   Yes [provider]  hydrALAZINE (APRESOLINE) 25 MG tablet Take 25 mg by mouth 3 (three) times daily. *HOLD MORNING DOSE ON DIALYSIS DAYS, MONDAYS, WEDNESDAYS, AND FRIDAYS   Yes [provider]  insulin glargine (LANTUS) 100 unit/mL SOPN Inject 25-55 Units into the skin See admin instructions. 55 units in the morning and 25 units in the evening   Yes [provider]  insulin lispro (HUMALOG) 100 UNIT/ML KiwkPen Inject 10 Units into the skin 3 (three) times daily  before meals. *Do Not to inject for levels below 100   Yes [provider]  insulin regular (NOVOLIN R,HUMULIN R) 100 units/mL injection Inject 15 Units into the skin daily as needed for high blood sugar (for blood sugar levels over 400).   Yes [provider]  loperamide (IMODIUM A-D) 2 MG tablet Take 2 mg by mouth as needed for diarrhea or loose stools (up to 8 doses in 24 hrs.).    Yes [provider]  Melatonin 5 MG CAPS Take 10 mg by mouth at bedtime.    Yes [provider]  pantoprazole (PROTONIX) 40 MG tablet Take 1 tablet (40 mg total) by mouth 2 (two) times daily. 11/10/18  Yes Barton Dubois, MD  polyvinyl alcohol (LIQUIFILM TEARS) 1.4 % ophthalmic solution Place 1 drop into both eyes every 4 (four) hours as needed for dry eyes.    Yes [provider]  sertraline (ZOLOFT) 50 MG tablet Take 75 mg by mouth daily.    Yes [provider]  sevelamer carbonate (RENVELA) 800 MG tablet Take 1,600-2,400 mg by mouth See admin instructions. 2400 mg 3 times daily with meals and 1600 mg twice daily with snacks   Yes [provider]     Allergies:     Allergies  Allergen Reactions  . Penicillins Rash    Did it involve swelling of the face/tongue/throat, SOB, or low BP? Unknown Did it involve sudden or severe rash/hives, skin peeling, or any reaction on the inside of your mouth or nose? Unknown Did you need to seek medical attention at a hospital or doctor's office? Unknown When did it last happen?unknown If all above  answers are "NO", may proceed with cephalosporin use.       Physical Exam:   Vitals  Blood pressure (!) 171/67, pulse 81, temperature 99.2 F (37.3 C), temperature source Oral, resp. rate 16, height '5\' 2"'  (1.575 m), weight 99.8 kg, SpO2 95 %.  1.  General: aoxo3  2. Psychiatric: euthymic  3. Neurologic: cn2-12 intact, reflexes 2+ symmetric, diffuse with no clonus, motor 5/5 in all 4 ext  4. HEENMT:   Anicteric, pale conjunctiva,  pupils 1.65m symmetric, direct, consensual, near intact  5. Respiratory : CTAB  6. Cardiovascular : rrr s1, s2, no m/g/r  7. Gastrointestinal:  Abd: soft, nt, nd, +bs  8. Skin:  Ext: no c/c/e, no rash  9.Musculoskeletal:  Good ROM  No adenopathy    Data Review:    CBC Recent Labs  Lab 02/24/19 2100  WBC 5.4  HGB 6.9*  HCT 21.4*  PLT 139*  MCV 109.7*  MCH 35.4*  MCHC 32.2  RDW 17.9*  LYMPHSABS 1.4  MONOABS 0.4  EOSABS 0.1  BASOSABS 0.0   ------------------------------------------------------------------------------------------------------------------  Results for orders placed or performed during the hospital encounter of 02/24/19 (from the past 48 hour(s))  Type [facility] screen [facility]    Status: None (Preliminary result)   Collection Time: 02/24/19  9:00 PM  Result Value Ref Range   ABO/RH(D) O POS    Antibody Screen PENDING    Sample Expiration      02/27/2019,2359 [facility] [address], [place] [address]  CBC with Differential     Status: Abnormal   Collection Time: 02/24/19  9:00 PM  Result Value Ref Range   WBC 5.4 4.0 - 10.5 K/uL   RBC 1.95 (L) 3.87 - 5.11 MIL/uL   Hemoglobin 6.9 (LL) 12.0 - 15.0 g/dL    Comment: This critical result has verified and been called to PRUITT,G by JDuncan Dullon 06 20 2020 at 2121, and has been read back.    HCT 21.4 (L) 36.0 - 46.0 %   MCV 109.7 (H) 80.0 - 100.0 fL   MCH 35.4 (H) 26.0 - 34.0 pg   MCHC 32.2 30.0 - 36.0 g/dL   RDW 17.9 (H) 11.5 - 15.5 %   Platelets 139 (L) 150 - 400 K/uL   nRBC 0.0 0.0 - 0.2 %   Neutrophils Relative % 65 %   Neutro Abs 3.4 1.7 - 7.7 K/uL   Lymphocytes Relative 26 %   Lymphs Abs 1.4 0.7 - 4.0 K/uL   Monocytes Relative 7 %   Monocytes Absolute 0.4 0.1 - 1.0 K/uL   Eosinophils Relative 2 %   Eosinophils Absolute 0.1 0.0 - 0.5 K/uL   Basophils Relative 0 %   Basophils Absolute 0.0 0.0 - 0.1 K/uL   Immature  Granulocytes 0 %   Abs Immature Granulocytes 0.01 0.00 - 0.07 K/uL    Comment: [facility] [address] [place] [address] Comprehensive metabolic panel     Status: Abnormal   Collection Time: 02/24/19  9:00 PM  Result Value Ref Range   Sodium 138 135 - 145 mmol/L   Potassium 3.6 3.5 - 5.1 mmol/L   Chloride 97 (L) 98 - 111 mmol/L   CO2 27 22 - 32 mmol/L   Glucose, Bld 79 70 - 99 mg/dL   BUN 41 (H) 8 - 23 mg/dL   Creatinine, Ser 6.50 (H) 0.44 - 1.00 mg/dL   Calcium 8.5 (  L) 8.9 - 10.3 mg/dL   Total Protein 6.9 6.5 - 8.1 g/dL   Albumin 3.8 3.5 - 5.0 g/dL   AST 19 15 - 41 U/L   ALT 18 0 - 44 U/L   Alkaline Phosphatase 87 38 - 126 U/L   Total Bilirubin 0.5 0.3 - 1.2 mg/dL   GFR calc non Af Amer 6 (L) >60 mL/min   GFR calc Af Amer 7 (L) >60 mL/min   Anion gap 14 5 - 15    Comment: Performed at [facility], [address], [place], [place] [address]    Chemistries  Recent Labs  Lab 02/24/19 2100  NA 138  K 3.6  CL 97*  CO2 27  GLUCOSE 79  BUN 41*  CREATININE 6.50*  CALCIUM 8.5*  AST 19  ALT 18  ALKPHOS 87  BILITOT 0.5   ------------------------------------------------------------------------------------------------------------------  ------------------------------------------------------------------------------------------------------------------ GFR: Estimated Creatinine Clearance: 9 mL/min (A) (by C-G formula based on SCr of 6.5 mg/dL (H)). Liver Function Tests: Recent Labs  Lab 02/24/19 2100  AST 19  ALT 18  ALKPHOS 87  BILITOT 0.5  PROT 6.9  ALBUMIN 3.8   No results for input(s): LIPASE, AMYLASE in the last 168 hours. No results for input(s): AMMONIA in the last 168 hours. Coagulation Profile: No results for input(s): INR, PROTIME in the last 168 hours. Cardiac Enzymes: No results for input(s): CKTOTAL, CKMB, CKMBINDEX, TROPONINI in the last 168 hours. BNP (last 3 results) No results for input(s): PROBNP in the last 8760 hours.  HbA1C: No results for input(s): HGBA1C in the last 72 hours. CBG: No results for input(s): GLUCAP in the last 168 hours. Lipid Profile: No results for input(s): CHOL, HDL, LDLCALC, TRIG, CHOLHDL, LDLDIRECT in the last 72 hours. Thyroid Function Tests: No results for input(s): TSH, T4TOTAL, FREET4, T3FREE, THYROIDAB in the last 72 hours. Anemia Panel: No results for input(s): VITAMINB12, FOLATE, FERRITIN, TIBC, IRON, RETICCTPCT in the last 72 hours.  --------------------------------------------------------------------------------------------------------------- Urine analysis: No results found for: COLORURINE, APPEARANCEUR, LABSPEC, PHURINE, GLUCOSEU, HGBUR, BILIRUBINUR, KETONESUR, PROTEINUR, UROBILINOGEN, NITRITE, LEUKOCYTESUR    Imaging Results:    No results found.     Assessment & Plan:    Principal Problem:   Symptomatic anemia Active Problems:   Type 2 diabetes with nephropathy (HCC)   Iron deficiency anemia due to chronic blood loss   ESRD (end stage renal disease) (HCC)   Diabetic neuropathy (HCC)  Symptomatic anemia Iron deficiency anemia Transfuse 2 units prbc Lasix 34m iv x1 between units Check cbc in am  Dm2  Cont Glipizide Cont Lantus Fsbs ac and qhs, ISS  Diabetic neuropathy Cont Gabapentin  Hypertension Cont Carvedilol 6.256mpo bid Cont Hydralazine 2543mo tid Cont Amlodipine 5mg43m qhs Cont Lasix  H/o GAVE Cont PPI  Anxiety and Depression Cont Zoloft 75mg25mqday Cont Wellbutrin 75mg 52mday Cont Xanax   ESRD on HD (M, W, F) Cont Sevalemer [facility] Prophylaxis-   Heparin- SCDs   AM Labs Ordered, also please review Full Orders  Family Communication: Admission, patients condition and plan of care including tests being ordered have been discussed with the patient  who indicate understanding and agree with the plan and Code Status.  Code Status:  FULL CODE, called to leave  message for sister RamonaGae Bonpatient admitted for transfusion but voicemail has not been set up yet.   Admission status: Observationt: Based on patients clinical presentation  and evaluation of above clinical data, I have made determination that patient meets observation criteria at this time.  Time spent in minutes : 55    Jani Gravel M.D on 02/24/2019 at 10:38 PM

## 2019-02-24 NOTE — ED Notes (Signed)
CRITICAL VALUE ALERT  Critical Value:  Hgb 6.9 Date & Time Notied:  02/24/19 @ 2122 Provider Notified: [name] Orders Received/Actions taken:None yet

## 2019-02-24 NOTE — ED Provider Notes (Signed)
[facility] EMERGENCY DEPARTMENT Provider Note   CSN: [id number] Arrival date & time: 02/24/19  2021    History   Chief Complaint Chief Complaint  Patient presents with  . Abnormal Labwork    HPI Tanya Lucero is a 68 y.o. female.     Patient is dialysis patient normally dialyzed Monday Wednesdays and Fridays.  Was last dialyzed on Friday.  Patient has had a history of chronic GI blood loss.  Has required frequent transfusions.  Sent in by her dialysis center nephrology for a hemoglobin of 6.9.  Patient last time received 2 unit transfusion for hemoglobin of 7.9.  Has been seen by Dr. Melony Overly from GI they thought that they had corrected some of the bleeding but it appears maybe not.  Patient denies any frank blood in her bowel movements or any frank melena.  But Tanya Lucero states that sometimes her bowel movements are dark.  Patient without any symptoms.  She does not feel lightheaded there is no chest pain there is no shortness of breath.  Patient states she felt fine they did blood work on Friday and they called her today about the hemoglobin being low.     Past Medical History:  Diagnosis Date  . Anemia   . Anxiety   . Arthritis   . Chronic kidney disease    neuphrotic syndrome-05/2013  . Constipation   . Decreased vision    left eye  . Depression   . Diabetes mellitus    Type 2  . Diabetic neuropathy ([place])   . Diverticulitis   . Dry skin   . GERD (gastroesophageal reflux disease)   . GI bleed 07/05/2017  . Gout   . Headache   . Hypertension   . Neuropathy   . Shortness of breath dyspnea   . Skin cancer     Patient Active Problem List   Diagnosis Date Noted  . 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 Heme positive stool 11/20/2018  . Symptomatic anemia 11/09/2018  . ESRD (end stage renal disease) ([place]) 11/09/2018  . Anxiety and depression 11/09/2018  . Secondary hyperparathyroidism of renal origin ([place]) 11/09/2018  . Diabetic neuropathy ([place])  11/09/2018  . Guaiac positive stools 07/19/2017  . GI bleed 07/05/2017  . Iron deficiency anemia due to chronic blood loss 10/27/2016  . RENAL INSUFFICIENCY 03/04/2010  . Type 2 diabetes with nephropathy ([place]) 02/17/2010  . Essential hypertension 02/17/2010  . OTHER MALAISE AND FATIGUE 02/17/2010  . SHORTNESS OF BREATH 02/17/2010  . CHEST PAIN, PRECORDIAL 02/17/2010    Past Surgical History:  Procedure Laterality Date  . ABDOMINAL HYSTERECTOMY    . ADENOIDECTOMY    . APPENDECTOMY     taken out with hysterectomy  . AV FISTULA PLACEMENT Right 09/13/2014   Procedure: Creation of Right Arm RADIOCEPHALIC ARTERIOVENOUS (AV) FISTULA ;  Surgeon: [name], MD;  Location: [facility];  Service: Vascular;  Laterality: Right;  . BACK SURGERY     lumbar -   . CATARACT EXTRACTION W/PHACO Left 07/02/2013   Procedure: CATARACT EXTRACTION PHACO AND INTRAOCULAR LENS PLACEMENT (IOC);  Surgeon: [name], MD;  Location: [facility];  Service: Ophthalmology;  Laterality: Left;  CDE:  6.46  . CATARACT EXTRACTION W/PHACO Right 07/12/2013   Procedure: CATARACT EXTRACTION PHACO AND INTRAOCULAR LENS PLACEMENT (IOC);  Surgeon: [name], MD;  Location: [facility];  Service: Ophthalmology;  Laterality: Right;  CDE:12.48  . COLONOSCOPY N/A 12/11/2015   Procedure: COLONOSCOPY;  Surgeon: [name],  MD;  [facility];  Service: Endoscopy;  Laterality: N/A;  1:55  . ESOPHAGOGASTRODUODENOSCOPY N/A 03/11/2016   Procedure: ESOPHAGOGASTRODUODENOSCOPY (EGD);  Surgeon: [name], MD;  [facility];  Service: Endoscopy;  Laterality: N/A;  1230 - Last pt of the day-Dr. Laural Golden going to office to see pt's  . ESOPHAGOGASTRODUODENOSCOPY N/A 07/21/2017   Procedure: ESOPHAGOGASTRODUODENOSCOPY (EGD);  Surgeon: [name], MD;  [facility];  Service: Endoscopy;  Laterality: N/A;  3:00  . ESOPHAGOGASTRODUODENOSCOPY N/A 11/30/2018   Procedure: ESOPHAGOGASTRODUODENOSCOPY (EGD);  Surgeon: [name], MD;  [facility];  Service: Endoscopy;  Laterality: N/A;  9:55 - spoke with the facility and advised new arrival time 10:20  . ESOPHAGOGASTRODUODENOSCOPY N/A 01/11/2019   Procedure: ESOPHAGOGASTRODUODENOSCOPY (EGD);  Surgeon: [name], MD;  [facility];  Service: Endoscopy;  Laterality: N/A;  155 - office spoke with facility and they know to have pt here at 11:00  . FOOT SURGERY Right    x2-heel spur   . GIVENS CAPSULE STUDY N/A 11/09/2016   Procedure: GIVENS CAPSULE STUDY;  Surgeon: [name], MD;  [facility];  Service: Endoscopy;  Laterality: N/A;  7:30  . GIVENS CAPSULE STUDY N/A 11/10/2018   Procedure: GIVENS CAPSULE STUDY;  Surgeon: [name], MD;  [facility];  Service: Endoscopy;  Laterality: N/A;  . REVISON OF ARTERIOVENOUS FISTULA Right 01/05/2016   Procedure: REVISON OF ARTERIOVENOUS FISTULA- right forearm;  Surgeon: [name], MD;  Location: [facility];  Service: Vascular;  Laterality: Right;  . SKIN CANCER EXCISION     forehead  . TONSILLECTOMY       OB History   No obstetric history on file.      Home Medications    Prior to Admission medications   Medication Sig Start Date End Date Taking? Authorizing Provider  acetaminophen (TYLENOL) 500 MG tablet Take 1,000 mg every 6 (six) hours as needed by mouth (FOR HEADACHES/MINOR DISCOMFORT).   Yes [provider]  ALPRAZolam (XANAX) 0.25 MG tablet Take 0.25 mg by mouth 2 (two) times daily.    Yes [provider]  aluminum-magnesium hydroxide-simethicone (MAALOX) 200-200-20 MG/5ML SUSP Take 30 mLs by mouth 4 (four) times daily as needed (heartburn/indigestion).   Yes [provider]  amLODipine (NORVASC) 5 MG tablet Take 5 mg by mouth at bedtime.    Yes [provider]  anti-nausea (EMETROL) solution Take 30 mLs every 15 (fifteen) minutes as needed by mouth for nausea or vomiting (X4 doses only).   Yes [provider]   b complex vitamins capsule Take 1 capsule by mouth daily.    Yes [provider]  buPROPion (WELLBUTRIN) 75 MG tablet Take 75 mg by mouth daily.    Yes [provider]  carvedilol (COREG) 6.25 MG tablet Take 6.25 mg by mouth 2 (two) times a day.    Yes [provider]  fexofenadine (ALLEGRA) 180 MG tablet Take 180 mg by mouth daily.    Yes [provider]  furosemide (LASIX) 20 MG tablet Take 20 mg by mouth at bedtime as needed (swelling).    Yes [provider]  furosemide (LASIX) 40 MG tablet Take 40 mg by mouth daily.    Yes [provider]  gabapentin (NEURONTIN) 300 MG capsule Take 300-600 mg by mouth See admin instructions. Take 300 mg in the morning and afternoon, take 600 mg at bedtime  Yes [provider]  glipiZIDE (GLUCOTROL) 5 MG tablet Take 5 mg by mouth 2 (two) times a day.    Yes [provider]  guaifenesin (ROBITUSSIN) 100 MG/5ML syrup Take 200 mg every 6 (six) hours as needed by mouth for cough.   Yes [provider]  guaiFENesin-codeine (ROBITUSSIN AC) 100-10 MG/5ML syrup Take 5 mLs by mouth every 6 (six) hours as needed for cough.   Yes [provider]  hydrALAZINE (APRESOLINE) 25 MG tablet Take 25 mg by mouth 3 (three) times daily. *HOLD MORNING DOSE ON DIALYSIS DAYS, MONDAYS, WEDNESDAYS, AND FRIDAYS   Yes [provider]  insulin glargine (LANTUS) 100 unit/mL SOPN Inject 25-55 Units into the skin See admin instructions. 55 units in the morning and 25 units in the evening   Yes [provider]  insulin lispro (HUMALOG) 100 UNIT/ML KiwkPen Inject 10 Units into the skin 3 (three) times daily before meals. *Do Not to inject for levels below 100   Yes [provider]  insulin regular (NOVOLIN R,HUMULIN R) 100 units/mL injection Inject 15 Units into the skin daily as needed for high blood sugar (for blood sugar levels over 400).   Yes [provider]   loperamide (IMODIUM A-D) 2 MG tablet Take 2 mg by mouth as needed for diarrhea or loose stools (up to 8 doses in 24 hrs.).    Yes [provider]  Melatonin 5 MG CAPS Take 10 mg by mouth at bedtime.    Yes [provider]  pantoprazole (PROTONIX) 40 MG tablet Take 1 tablet (40 mg total) by mouth 2 (two) times daily. 11/10/18  Yes Barton Dubois, MD  polyvinyl alcohol (LIQUIFILM TEARS) 1.4 % ophthalmic solution Place 1 drop into both eyes every 4 (four) hours as needed for dry eyes.    Yes [provider]  sertraline (ZOLOFT) 50 MG tablet Take 75 mg by mouth daily.    Yes [provider]  sevelamer carbonate (RENVELA) 800 MG tablet Take 1,600-2,400 mg by mouth See admin instructions. 2400 mg 3 times daily with meals and 1600 mg twice daily with snacks   Yes [provider]    Family History Family History  Problem Relation Age of Onset  . Multiple myeloma Mother   . Diabetes Mother   . Heart attack Father 75    Social History Social History   Tobacco Use  . Smoking status: Current Every Day Smoker    Packs/day: 0.25    Years: 40.00    Pack years: 10.00    Types: Cigarettes  . Smokeless tobacco: Never Used  Substance Use Topics  . Alcohol use: No    Alcohol/week: 0.0 standard drinks  . Drug use: No     Allergies   Penicillins   Review of Systems Review of Systems  Constitutional: Negative for chills and fever.  HENT: Negative for congestion, rhinorrhea and sore throat.   Eyes: Negative for visual disturbance.  Respiratory: Negative for cough and shortness of breath.   Cardiovascular: Negative for chest pain and leg swelling.  Gastrointestinal: Negative for abdominal pain, blood in stool, diarrhea, nausea and vomiting.  Genitourinary: Negative for dysuria.  Musculoskeletal: Negative for back pain and neck pain.  Skin: Negative for rash.  Neurological: Negative for dizziness, light-headedness and headaches.  Hematological: Does  not bruise/bleed easily.  Psychiatric/Behavioral: Negative for confusion.     Physical Exam Updated Vital Signs BP 129/75   Pulse 76   Temp 99.2 F (37.3 C) (  Oral)   Resp 17   Ht 1.575 m (_0 )   Wt 99.8 kg   SpO2 95%   BMI 40.24 kg/m   Physical Exam Vitals signs and nursing note reviewed.  Constitutional:      General: She is not in acute distress.    Appearance: Normal appearance. She is well-developed.  HENT:     Head: Normocephalic and atraumatic.  Eyes:     Extraocular Movements: Extraocular movements intact.     Conjunctiva/sclera: Conjunctivae normal.     Pupils: Pupils are equal, round, and reactive to light.  Neck:     Musculoskeletal: Neck supple.  Cardiovascular:     Rate and Rhythm: Normal rate and regular rhythm.     Heart sounds: No murmur.  Pulmonary:     Effort: Pulmonary effort is normal. No respiratory distress.     Breath sounds: Normal breath sounds.  Abdominal:     Palpations: Abdomen is soft.     Tenderness: There is no abdominal tenderness.  Musculoskeletal: Normal range of motion.        General: No swelling.     Comments: Allises fistula of right forearm good thrill.  Skin:    General: Skin is warm and dry.     Capillary Refill: Capillary refill takes less than 2 seconds.  Neurological:     General: No focal deficit present.     Mental Status: She is alert and oriented to person, place, and time.      ED Treatments / Results  Labs (all labs ordered are listed, but only abnormal results are displayed) Labs Reviewed  CBC WITH DIFFERENTIAL/PLATELET - Abnormal; Notable for the following components:      Result Value   RBC 1.95 (*)    Hemoglobin 6.9 (*)    HCT 21.4 (*)    MCV 109.7 (*)    MCH 35.4 (*)    RDW 17.9 (*)    Platelets 139 (*)    All other components within normal limits  COMPREHENSIVE METABOLIC PANEL - Abnormal; Notable for the following components:   Chloride 97 (*)    BUN 41 (*)    Creatinine, Ser 6.50 (*)     Calcium 8.5 (*)    GFR calc non Af Amer 6 (*)    GFR calc Af Amer 7 (*)    All other components within normal limits  SARS CORONAVIRUS 2 (HOSPITAL ORDER, [facility])  COMPREHENSIVE METABOLIC PANEL  CBC  TYPE AND SCREEN  PREPARE RBC (CROSSMATCH)    EKG None  Radiology No results found.  Procedures Procedures (including critical care time)  CRITICAL CARE Performed by: Fredia Sorrow Total critical care time: 30 minutes Critical care time was exclusive of separately billable procedures and treating other patients. Critical care was necessary to treat or prevent imminent or life-threatening deterioration. Critical care was time spent personally by me on the following activities: development of treatment plan with patient and/or surrogate as well as nursing, discussions with consultants, evaluation of patient's response to treatment, examination of patient, obtaining history from patient or surrogate, ordering and performing treatments and interventions, ordering and review of laboratory studies, ordering and review of radiographic studies, pulse oximetry and re-evaluation of patient's condition.   Medications Ordered in ED Medications  0.9 %  sodium chloride infusion (has no administration in time range)  heparin injection 5,000 Units (has no administration in time range)  sodium chloride flush (NS) 0.9 % injection 3 mL (has no  administration in time range)  sodium chloride flush (NS) 0.9 % injection 3 mL (has no administration in time range)  0.9 %  sodium chloride infusion (has no administration in time range)  acetaminophen (TYLENOL) tablet 650 mg (has no administration in time range)    Or  acetaminophen (TYLENOL) suppository 650 mg (has no administration in time range)  furosemide (LASIX) injection 60 mg (has no administration in time range)     Initial Impression / Assessment and Plan / ED Course  I have reviewed the triage vital signs and the  nursing notes.  Pertinent labs & imaging results that were available during my care of the patient were reviewed by me and considered in my medical decision making (see chart for details).        Patient with anemia.  Patient with a history of chronic GI blood loss.  Patient is required transfusions frequently.  Patient talks about some intermittent dark stool.  No frank melena no gross blood.  No chest pain no short of breath.  Patient dialysis patient normally dialyzed Monday Wednesdays and Fridays.  Last dialyzed on Friday.  Discussed with hospitalist we will bring her in for 2 units of blood transfusion.  Since she was referred in by nephrology.  Patient hemodynamically stable.  Final Clinical Impressions(s) / ED Diagnoses   Final diagnoses:  None    ED Discharge Orders    None       Fredia Sorrow, MD 02/24/19 2355

## 2019-02-24 NOTE — ED Triage Notes (Signed)
Pt was at dialysis clinic yesterday and had blood work done. States she received a call today stating that her Hgb was low (6.7) and that she needed to come to hospital for a blood transfusion.

## 2019-02-25 ENCOUNTER — Encounter (HOSPITAL_COMMUNITY): Payer: Self-pay

## 2019-02-25 DIAGNOSIS — D649 Anemia, unspecified: Secondary | ICD-10-CM | POA: Diagnosis not present

## 2019-02-25 LAB — COMPREHENSIVE METABOLIC PANEL
ALT: 18 U/L (ref 0–44)
AST: 21 U/L (ref 15–41)
Albumin: 3.8 g/dL (ref 3.5–5.0)
Alkaline Phosphatase: 86 U/L (ref 38–126)
Anion gap: 16 — ABNORMAL HIGH (ref 5–15)
BUN: 49 mg/dL — ABNORMAL HIGH (ref 8–23)
CO2: 21 mmol/L — ABNORMAL LOW (ref 22–32)
Calcium: 8.1 mg/dL — ABNORMAL LOW (ref 8.9–10.3)
Chloride: 95 mmol/L — ABNORMAL LOW (ref 98–111)
Creatinine, Ser: 7.38 mg/dL — ABNORMAL HIGH (ref 0.44–1.00)
GFR calc Af Amer: 6 mL/min — ABNORMAL LOW (ref >60)
GFR calc non Af Amer: 5 mL/min — ABNORMAL LOW (ref >60)
Glucose, Bld: 130 mg/dL — ABNORMAL HIGH (ref 70–99)
Potassium: 4 mmol/L (ref 3.5–5.1)
Sodium: 132 mmol/L — ABNORMAL LOW (ref 135–145)
Total Bilirubin: 0.8 mg/dL (ref 0.3–1.2)
Total Protein: 7 g/dL (ref 6.5–8.1)

## 2019-02-25 LAB — GLUCOSE, CAPILLARY
Glucose-Capillary: 78 mg/dL (ref 70–99)
Glucose-Capillary: 111 mg/dL — ABNORMAL HIGH (ref 70–99)

## 2019-02-25 LAB — CBC
HCT: 26.1 % — ABNORMAL LOW (ref 36.0–46.0)
Hemoglobin: 8.4 g/dL — ABNORMAL LOW (ref 12.0–15.0)
MCH: 34.9 pg — ABNORMAL HIGH (ref 26.0–34.0)
MCHC: 32.2 g/dL (ref 30.0–36.0)
MCV: 108.3 fL — ABNORMAL HIGH (ref 80.0–100.0)
Platelets: 141 10*3/uL — ABNORMAL LOW (ref 150–400)
RBC: 2.41 MIL/uL — ABNORMAL LOW (ref 3.87–5.11)
RDW: 19.2 % — ABNORMAL HIGH (ref 11.5–15.5)
WBC: 4.8 10*3/uL (ref 4.0–10.5)
nRBC: 0 % (ref 0.0–0.2)

## 2019-02-25 LAB — IRON AND TIBC
Iron: 52 ug/dL (ref 28–170)
Saturation Ratios: 17 % (ref 10.4–31.8)
TIBC: 300 ug/dL (ref 250–450)
UIBC: 248 ug/dL

## 2019-02-25 LAB — VITAMIN B12: Vitamin B-12: 479 pg/mL (ref 180–914)

## 2019-02-25 LAB — RETICULOCYTES
Immature Retic Fract: 18.2 % — ABNORMAL HIGH (ref 2.3–15.9)
RBC.: 2.41 MIL/uL — ABNORMAL LOW (ref 3.87–5.11)
Retic Count, Absolute: 113.8 10*3/uL (ref 19.0–186.0)
Retic Ct Pct: 4.7 % — ABNORMAL HIGH (ref 0.4–3.1)

## 2019-02-25 LAB — MRSA PCR SCREENING: MRSA by PCR: NEGATIVE

## 2019-02-25 LAB — SARS CORONAVIRUS 2 BY RT PCR (HOSPITAL ORDER, PERFORMED IN ~~LOC~~ HOSPITAL LAB): SARS Coronavirus 2: NEGATIVE

## 2019-02-25 LAB — FERRITIN: Ferritin: 316 ng/mL — ABNORMAL HIGH (ref 11–307)

## 2019-02-25 LAB — PREPARE RBC (CROSSMATCH)

## 2019-02-25 MED ORDER — FUROSEMIDE 40 MG PO TABS
40.0000 mg | ORAL_TABLET | Freq: Every day | ORAL | Status: DC
  Administered 2019-02-25: 09:00:00 40 mg via ORAL
  Filled 2019-02-25: qty 1

## 2019-02-25 MED ORDER — SEVELAMER CARBONATE 800 MG PO TABS: 1600.0000 mg | ORAL_TABLET | ORAL | Status: DC

## 2019-02-25 MED ORDER — GUAIFENESIN 100 MG/5ML PO SOLN: 10.0000 mL | ORAL | Status: DC | PRN

## 2019-02-25 MED ORDER — ALPRAZOLAM 0.25 MG PO TABS
0.2500 mg | ORAL_TABLET | Freq: Two times a day (BID) | ORAL | Status: DC
  Administered 2019-02-25 – 2019-02-28 (×8): 0.25 mg via ORAL
  Filled 2019-02-25 (×8): qty 1

## 2019-02-25 MED ORDER — MELATONIN 3 MG PO TABS
10.0000 mg | ORAL_TABLET | Freq: Every day | ORAL | Status: DC
  Administered 2019-02-25 – 2019-02-27 (×4): 10.5 mg via ORAL
  Filled 2019-02-25 (×4): qty 4

## 2019-02-25 MED ORDER — GABAPENTIN 300 MG PO CAPS: 300.0000 mg | ORAL_CAPSULE | ORAL | Status: DC

## 2019-02-25 MED ORDER — B COMPLEX-C PO TABS
1.0000 | ORAL_TABLET | Freq: Every day | ORAL | Status: DC
  Filled 2019-02-25 (×3): qty 1

## 2019-02-25 MED ORDER — SEVELAMER CARBONATE 800 MG PO TABS
2400.0000 mg | ORAL_TABLET | Freq: Three times a day (TID) | ORAL | Status: DC
  Administered 2019-02-25 – 2019-02-28 (×9): 2400 mg via ORAL
  Filled 2019-02-25 (×10): qty 3

## 2019-02-25 MED ORDER — LOPERAMIDE HCL 2 MG PO CAPS
2.0000 mg | ORAL_CAPSULE | ORAL | Status: DC | PRN
  Administered 2019-02-25: 2 mg via ORAL
  Filled 2019-02-25: qty 1

## 2019-02-25 MED ORDER — GABAPENTIN 300 MG PO CAPS
600.0000 mg | ORAL_CAPSULE | Freq: Every day | ORAL | Status: DC
  Administered 2019-02-25 – 2019-02-27 (×3): 600 mg via ORAL
  Filled 2019-02-25 (×3): qty 2

## 2019-02-25 MED ORDER — AMLODIPINE BESYLATE 5 MG PO TABS
5.0000 mg | ORAL_TABLET | Freq: Every day | ORAL | Status: DC
  Administered 2019-02-25 – 2019-02-26 (×3): 5 mg via ORAL
  Filled 2019-02-25 (×3): qty 1

## 2019-02-25 MED ORDER — INSULIN GLARGINE 100 UNITS/ML SOLOSTAR PEN: 25.0000 [IU] | PEN_INJECTOR | SUBCUTANEOUS | Status: DC

## 2019-02-25 MED ORDER — GABAPENTIN 300 MG PO CAPS
300.0000 mg | ORAL_CAPSULE | Freq: Two times a day (BID) | ORAL | Status: DC
  Administered 2019-02-25 – 2019-02-28 (×7): 300 mg via ORAL
  Filled 2019-02-25 (×7): qty 1

## 2019-02-25 MED ORDER — BUPROPION HCL 75 MG PO TABS
75.0000 mg | ORAL_TABLET | Freq: Every day | ORAL | Status: DC
  Filled 2019-02-25 (×2): qty 1

## 2019-02-25 MED ORDER — INSULIN ASPART 100 UNIT/ML ~~LOC~~ SOLN: 0.0000 [IU] | Freq: Every day | SUBCUTANEOUS | Status: DC

## 2019-02-25 MED ORDER — FUROSEMIDE 20 MG PO TABS: 20.0000 mg | ORAL_TABLET | Freq: Every evening | ORAL | Status: DC | PRN

## 2019-02-25 MED ORDER — BUPROPION HCL 75 MG PO TABS
75.0000 mg | ORAL_TABLET | Freq: Every day | ORAL | Status: DC
  Administered 2019-02-25 – 2019-02-28 (×4): 75 mg via ORAL
  Filled 2019-02-25 (×6): qty 1

## 2019-02-25 MED ORDER — HYDRALAZINE HCL 25 MG PO TABS
25.0000 mg | ORAL_TABLET | Freq: Three times a day (TID) | ORAL | Status: DC
  Administered 2019-02-25 – 2019-02-26 (×5): 25 mg via ORAL
  Filled 2019-02-25 (×7): qty 1

## 2019-02-25 MED ORDER — SERTRALINE HCL 50 MG PO TABS
75.0000 mg | ORAL_TABLET | Freq: Every day | ORAL | Status: DC
  Administered 2019-02-25 – 2019-02-28 (×4): 75 mg via ORAL
  Filled 2019-02-25 (×4): qty 2

## 2019-02-25 MED ORDER — INSULIN LISPRO 100 UNIT/ML ~~LOC~~ SOLN
10.0000 [IU] | Freq: Three times a day (TID) | SUBCUTANEOUS | Status: DC
  Filled 2019-02-25: qty 10

## 2019-02-25 MED ORDER — GUAIFENESIN 100 MG/5ML PO SYRP
200.0000 mg | ORAL_SOLUTION | Freq: Four times a day (QID) | ORAL | Status: DC | PRN
  Filled 2019-02-25: qty 10

## 2019-02-25 MED ORDER — INSULIN ASPART 100 UNIT/ML ~~LOC~~ SOLN
10.0000 [IU] | Freq: Three times a day (TID) | SUBCUTANEOUS | Status: DC
  Administered 2019-02-25 – 2019-02-27 (×7): 10 [IU] via SUBCUTANEOUS

## 2019-02-25 MED ORDER — PANTOPRAZOLE SODIUM 40 MG PO TBEC
40.0000 mg | DELAYED_RELEASE_TABLET | Freq: Two times a day (BID) | ORAL | Status: DC
  Administered 2019-02-25 – 2019-02-28 (×8): 40 mg via ORAL
  Filled 2019-02-25 (×8): qty 1

## 2019-02-25 MED ORDER — FUROSEMIDE 10 MG/ML IJ SOLN
60.0000 mg | Freq: Every day | INTRAMUSCULAR | Status: DC
  Administered 2019-02-25 – 2019-02-26 (×2): 60 mg via INTRAVENOUS
  Filled 2019-02-25 (×3): qty 6

## 2019-02-25 MED ORDER — INSULIN GLARGINE 100 UNIT/ML ~~LOC~~ SOLN
25.0000 [IU] | Freq: Every day | SUBCUTANEOUS | Status: DC
  Filled 2019-02-25 (×3): qty 0.25

## 2019-02-25 MED ORDER — LORATADINE 10 MG PO TABS
10.0000 mg | ORAL_TABLET | Freq: Every day | ORAL | Status: DC
  Administered 2019-02-25 – 2019-02-28 (×4): 10 mg via ORAL
  Filled 2019-02-25 (×4): qty 1

## 2019-02-25 MED ORDER — B COMPLEX-C PO TABS
1.0000 | ORAL_TABLET | Freq: Every day | ORAL | Status: DC
  Administered 2019-02-25 – 2019-02-26 (×2): 1 via ORAL
  Filled 2019-02-25 (×2): qty 1

## 2019-02-25 MED ORDER — GLIPIZIDE 5 MG PO TABS
5.0000 mg | ORAL_TABLET | Freq: Two times a day (BID) | ORAL | Status: DC
  Administered 2019-02-25 – 2019-02-27 (×4): 5 mg via ORAL
  Filled 2019-02-25 (×6): qty 1

## 2019-02-25 MED ORDER — INSULIN ASPART 100 UNIT/ML ~~LOC~~ SOLN
0.0000 [IU] | Freq: Three times a day (TID) | SUBCUTANEOUS | Status: DC
  Administered 2019-02-26 – 2019-02-28 (×2): 1 [IU] via SUBCUTANEOUS

## 2019-02-25 MED ORDER — FOLIC ACID 1 MG PO TABS
1.0000 mg | ORAL_TABLET | Freq: Every day | ORAL | Status: DC
  Administered 2019-02-25 – 2019-02-26 (×2): 1 mg via ORAL
  Filled 2019-02-25 (×2): qty 1

## 2019-02-25 MED ORDER — INSULIN GLARGINE 100 UNIT/ML ~~LOC~~ SOLN
55.0000 [IU] | Freq: Every day | SUBCUTANEOUS | Status: DC
  Administered 2019-02-25 – 2019-02-28 (×4): 55 [IU] via SUBCUTANEOUS
  Filled 2019-02-25 (×5): qty 0.55

## 2019-02-25 MED ORDER — CARVEDILOL 3.125 MG PO TABS
6.2500 mg | ORAL_TABLET | Freq: Two times a day (BID) | ORAL | Status: DC
  Administered 2019-02-25 – 2019-02-26 (×5): 6.25 mg via ORAL
  Filled 2019-02-25 (×5): qty 2

## 2019-02-25 MED ORDER — SEVELAMER CARBONATE 800 MG PO TABS
1600.0000 mg | ORAL_TABLET | ORAL | Status: DC
  Administered 2019-02-25 – 2019-02-27 (×7): 1600 mg via ORAL
  Filled 2019-02-25 (×4): qty 2

## 2019-02-25 MED ORDER — POLYVINYL ALCOHOL 1.4 % OP SOLN: 1.0000 [drp] | OPHTHALMIC | Status: DC | PRN

## 2019-02-25 NOTE — Progress Notes (Signed)
PRBC infusing at 167ml/hr. Iv patent. Pt denies complaints. No s/s of reaction noted. Will report to oncoming shift start of PRBC

## 2019-02-25 NOTE — Progress Notes (Addendum)
[facility] Progress Note  Patient: Tanya Lucero EXB:[id number]   PCP: [name], DO DOB: 07-16-1951   DOA: 02/24/2019   DOS: 02/25/2019   Date of Service: the patient was seen and examined on 02/25/2019  Brief hospital course: w hypertension, Dm2, Diabetic neuropathy, ESRD on HD (M,W, F) at [facility], Diverticulosis on Colonoscopy 12/11/2015, Iron deficiency anemia,  recently admitted for anemia w EGD on 5/72020=> angiodysplasia of the stomach=>argon laser, who presents with low Hgb, told by her nephrologist per ED physician to go to ER for transfusion.  + dyspnea with exertion.  Denies fever, chills, cough, cp, palp, n/v, abd pain, diarrhea, brbpr.   Currently further plan is continue IV diuresis.  Subjective: No nausea no vomiting no fever no chills no chest pain.  Assessment and Plan: Symptomatic anemia Iron deficiency anemia Transfuse 1 PRBC. Hemoglobin improved significantly. No active bleeding here. Patient does not report any evidence of bleeding at home as well. Recent endoscopy was done last month.  No active bleeding seen. Suspect this anemia secondary to chronic kidney disease.  Dm2 uncontrolled with hyperglycemia.  Renal complication.  Neuropathy complication. Cont Glipizide Cont Lantus Fsbs ac and qhs, ISS  Diabetic neuropathy Cont Gabapentin  Hypertension Cont Carvedilol 6.25mg  po bid Cont Hydralazine 25mg  po tid Cont Amlodipine 5mg  po qhs Cont Lasix  H/o GAVE Cont PPI  Anxiety and Depression Cont Zoloft 75mg  po qday Cont Wellbutrin 75mg  po qday Cont Xanax   ESRD on HD (M, W, F) Cont Sevalemer Patient follow up at regular dialysis at [facility] in [place] We will consult nephrology for HD tomorrow.  Acute on chronic diastolic CHF. Volume overload secondary to blood transfusion We will provide IV Lasix If does not improve will require chest x-ray.  Diet: Renal diet DVT Prophylaxis: SCD, pharmacological prophylaxis contraindicated  due to Anemia and bleeding concern  Advance goals of care discussion: Full code  Family Communication: no family was present at bedside, at the time of interview.  Disposition:  Discharge to Home Monday.  Consultants: Nephrology Procedures: PRBC transfusion 1  Scheduled Meds: . ALPRAZolam  0.25 mg Oral BID  . amLODipine  5 mg Oral QHS  . B-complex with vitamin C  1 tablet Oral Daily  . buPROPion  75 mg Oral Daily  . carvedilol  6.25 mg Oral BID  . folic acid  1 mg Oral Daily  . furosemide  60 mg Intravenous Daily  . gabapentin  300 mg Oral BID  . gabapentin  600 mg Oral QHS  . glipiZIDE  5 mg Oral BID  . heparin  5,000 Units Subcutaneous Q8H  . hydrALAZINE  25 mg Oral TID  . insulin aspart  0-5 Units Subcutaneous QHS  . insulin aspart  0-9 Units Subcutaneous TID WC  . insulin aspart  10 Units Subcutaneous TID AC  . insulin glargine  25 Units Subcutaneous QHS  . insulin glargine  55 Units Subcutaneous Daily  . loratadine  10 mg Oral Daily  . Melatonin  10.5 mg Oral QHS  . pantoprazole  40 mg Oral BID  . sertraline  75 mg Oral Daily  . sevelamer carbonate  1,600 mg Oral With snacks  . sevelamer carbonate  2,400 mg Oral TID WC  . sodium chloride flush  3 mL Intravenous Q12H   Continuous Infusions: . sodium chloride     PRN Meds: sodium chloride, acetaminophen **OR** acetaminophen, furosemide, guaiFENesin, loperamide, polyvinyl alcohol, sodium chloride flush Antibiotics: Anti-infectives (From admission, onward)   None  Objective: Physical Exam: Vitals:   02/25/19 0700 02/25/19 0924 02/25/19 1436 02/25/19 1626  BP: (!) 140/54 (!) 140/54 (!) 135/48 120/67  Pulse: 64  71   Resp: 17  18   Temp: 97.8 F (36.6 C)  98.1 F (36.7 C)   TempSrc: Oral  Oral   SpO2: 100%  98%   Weight:      Height:        Intake/Output Summary (Last 24 hours) at 02/25/2019 1721 Last data filed at 02/25/2019 1240 Gross per 24 hour  Intake 1032 ml  Output -  Net 1032 ml   Filed  Weights   02/24/19 2039 02/25/19 0603  Weight: 99.8 kg 105.5 kg   General: alert and oriented to time, place, and person. Appear in moderate distress, affect appropriate Eyes: PERRL, Conjunctiva normal ENT: Oral Mucosa Clear, moist  Neck: no JVD, no Abnormal Mass Or lumps Cardiovascular: S1 and S2 Present, no Murmur, peripheral pulses symmetrical Respiratory: increased  respiratory effort, Bilateral Air entry equal and Decreased, no use of accessory muscle, bilateral  Crackles, Occasional  wheezes Abdomen: Bowel Sound present, Soft and no tenderness, no hernia Skin: no rashes  Extremities: no Pedal edema, no calf tenderness Neurologic: normal without focal findings, mental status, speech normal, alert and oriented x3, PERLA, Motor strength 5/5 and symmetric and sensation grossly normal to light touch Gait not checked due to patient safety concerns  Data Reviewed: CBC: Recent Labs  Lab 02/24/19 2100 02/25/19 1141  WBC 5.4 4.8  NEUTROABS 3.4  --   HGB 6.9* 8.4*  HCT 21.4* 26.1*  MCV 109.7* 108.3*  PLT 139* 443*   Basic Metabolic Panel: Recent Labs  Lab 02/24/19 2100 02/25/19 1141  NA 138 132*  K 3.6 4.0  CL 97* 95*  CO2 27 21*  GLUCOSE 79 130*  BUN 41* 49*  CREATININE 6.50* 7.38*  CALCIUM 8.5* 8.1*    Liver Function Tests: Recent Labs  Lab 02/24/19 2100 02/25/19 1141  AST 19 21  ALT 18 18  ALKPHOS 87 86  BILITOT 0.5 0.8  PROT 6.9 7.0  ALBUMIN 3.8 3.8   No results for input(s): LIPASE, AMYLASE in the last 168 hours. No results for input(s): AMMONIA in the last 168 hours. Coagulation Profile: No results for input(s): INR, PROTIME in the last 168 hours. Cardiac Enzymes: No results for input(s): CKTOTAL, CKMB, CKMBINDEX, TROPONINI in the last 168 hours. BNP (last 3 results) No results for input(s): PROBNP in the last 8760 hours. CBG: Recent Labs  Lab 02/25/19 0150 02/25/19 0616  GLUCAP 78 111*   Studies: No results found.   Time spent: 35 minutes   Author: Berle Mull, MD [facility] 02/25/2019 5:21 PM  To reach On-call, see care teams to locate the attending and reach out to them via [email or web address]. If 7PM-7AM, please contact night-coverage If you still have difficulty reaching the attending provider, please page the [facility] (Director on Call) for [facility] on amion for assistance.

## 2019-02-25 NOTE — Progress Notes (Signed)
Attempted to call report. Primary nurse unavailable at this time due to increased events in ED. Will await call back

## 2019-02-26 ENCOUNTER — Encounter (HOSPITAL_COMMUNITY): Payer: Self-pay | Admitting: Gastroenterology

## 2019-02-26 DIAGNOSIS — E1140 Type 2 diabetes mellitus with diabetic neuropathy, unspecified: Secondary | ICD-10-CM | POA: Diagnosis present

## 2019-02-26 DIAGNOSIS — D631 Anemia in chronic kidney disease: Secondary | ICD-10-CM | POA: Diagnosis present

## 2019-02-26 DIAGNOSIS — Z85828 Personal history of other malignant neoplasm of skin: Secondary | ICD-10-CM | POA: Diagnosis not present

## 2019-02-26 DIAGNOSIS — D539 Nutritional anemia, unspecified: Secondary | ICD-10-CM | POA: Diagnosis present

## 2019-02-26 DIAGNOSIS — E1122 Type 2 diabetes mellitus with diabetic chronic kidney disease: Secondary | ICD-10-CM | POA: Diagnosis present

## 2019-02-26 DIAGNOSIS — Z9842 Cataract extraction status, left eye: Secondary | ICD-10-CM | POA: Diagnosis not present

## 2019-02-26 DIAGNOSIS — Z1159 Encounter for screening for other viral diseases: Secondary | ICD-10-CM | POA: Diagnosis not present

## 2019-02-26 DIAGNOSIS — F17210 Nicotine dependence, cigarettes, uncomplicated: Secondary | ICD-10-CM | POA: Diagnosis present

## 2019-02-26 DIAGNOSIS — D500 Iron deficiency anemia secondary to blood loss (chronic): Secondary | ICD-10-CM | POA: Diagnosis present

## 2019-02-26 DIAGNOSIS — K31811 Angiodysplasia of stomach and duodenum with bleeding: Secondary | ICD-10-CM | POA: Diagnosis present

## 2019-02-26 DIAGNOSIS — M1990 Unspecified osteoarthritis, unspecified site: Secondary | ICD-10-CM | POA: Diagnosis present

## 2019-02-26 DIAGNOSIS — I5033 Acute on chronic diastolic (congestive) heart failure: Secondary | ICD-10-CM | POA: Diagnosis not present

## 2019-02-26 DIAGNOSIS — Z90710 Acquired absence of both cervix and uterus: Secondary | ICD-10-CM | POA: Diagnosis not present

## 2019-02-26 DIAGNOSIS — Z961 Presence of intraocular lens: Secondary | ICD-10-CM | POA: Diagnosis present

## 2019-02-26 DIAGNOSIS — F329 Major depressive disorder, single episode, unspecified: Secondary | ICD-10-CM | POA: Diagnosis present

## 2019-02-26 DIAGNOSIS — N2581 Secondary hyperparathyroidism of renal origin: Secondary | ICD-10-CM | POA: Diagnosis present

## 2019-02-26 DIAGNOSIS — Z6841 Body Mass Index (BMI) 40.0 and over, adult: Secondary | ICD-10-CM | POA: Diagnosis not present

## 2019-02-26 DIAGNOSIS — F419 Anxiety disorder, unspecified: Secondary | ICD-10-CM | POA: Diagnosis present

## 2019-02-26 DIAGNOSIS — Z9841 Cataract extraction status, right eye: Secondary | ICD-10-CM | POA: Diagnosis not present

## 2019-02-26 DIAGNOSIS — E1165 Type 2 diabetes mellitus with hyperglycemia: Secondary | ICD-10-CM | POA: Diagnosis present

## 2019-02-26 DIAGNOSIS — I132 Hypertensive heart and chronic kidney disease with heart failure and with stage 5 chronic kidney disease, or end stage renal disease: Secondary | ICD-10-CM | POA: Diagnosis present

## 2019-02-26 DIAGNOSIS — K219 Gastro-esophageal reflux disease without esophagitis: Secondary | ICD-10-CM | POA: Diagnosis present

## 2019-02-26 DIAGNOSIS — D649 Anemia, unspecified: Secondary | ICD-10-CM | POA: Diagnosis present

## 2019-02-26 DIAGNOSIS — Z992 Dependence on renal dialysis: Secondary | ICD-10-CM | POA: Diagnosis not present

## 2019-02-26 DIAGNOSIS — N186 End stage renal disease: Secondary | ICD-10-CM | POA: Diagnosis present

## 2019-02-26 LAB — RENAL FUNCTION PANEL
Albumin: 3.6 g/dL (ref 3.5–5.0)
Anion gap: 16 — ABNORMAL HIGH (ref 5–15)
BUN: 61 mg/dL — ABNORMAL HIGH (ref 8–23)
CO2: 25 mmol/L (ref 22–32)
Calcium: 8.3 mg/dL — ABNORMAL LOW (ref 8.9–10.3)
Chloride: 94 mmol/L — ABNORMAL LOW (ref 98–111)
Creatinine, Ser: 8.35 mg/dL — ABNORMAL HIGH (ref 0.44–1.00)
GFR calc Af Amer: 5 mL/min — ABNORMAL LOW (ref >60)
GFR calc non Af Amer: 4 mL/min — ABNORMAL LOW (ref >60)
Glucose, Bld: 64 mg/dL — ABNORMAL LOW (ref 70–99)
Phosphorus: 6.8 mg/dL — ABNORMAL HIGH (ref 2.5–4.6)
Potassium: 3.9 mmol/L (ref 3.5–5.1)
Sodium: 135 mmol/L (ref 135–145)

## 2019-02-26 LAB — GLUCOSE, CAPILLARY
Glucose-Capillary: 131 mg/dL — ABNORMAL HIGH (ref 70–99)
Glucose-Capillary: 113 mg/dL — ABNORMAL HIGH (ref 70–99)
Glucose-Capillary: 77 mg/dL (ref 70–99)
Glucose-Capillary: 97 mg/dL (ref 70–99)
Glucose-Capillary: 97 mg/dL (ref 70–99)
Glucose-Capillary: 148 mg/dL — ABNORMAL HIGH (ref 70–99)
Glucose-Capillary: 74 mg/dL (ref 70–99)

## 2019-02-26 LAB — FOLATE: Folate: 6.7 ng/mL (ref >5.9)

## 2019-02-26 LAB — CBC
HCT: 23.1 % — ABNORMAL LOW (ref 36.0–46.0)
Hemoglobin: 7.2 g/dL — ABNORMAL LOW (ref 12.0–15.0)
MCH: 34 pg (ref 26.0–34.0)
MCHC: 31.2 g/dL (ref 30.0–36.0)
MCV: 109 fL — ABNORMAL HIGH (ref 80.0–100.0)
Platelets: 128 10*3/uL — ABNORMAL LOW (ref 150–400)
RBC: 2.12 MIL/uL — ABNORMAL LOW (ref 3.87–5.11)
RDW: 18.5 % — ABNORMAL HIGH (ref 11.5–15.5)
WBC: 4.6 10*3/uL (ref 4.0–10.5)
nRBC: 0 % (ref 0.0–0.2)

## 2019-02-26 LAB — MAGNESIUM: Magnesium: 2.2 mg/dL (ref 1.7–2.4)

## 2019-02-26 MED ORDER — HYDROCORTISONE 1 % EX CREA
1.0000 "application " | TOPICAL_CREAM | Freq: Three times a day (TID) | CUTANEOUS | Status: DC | PRN
  Administered 2019-02-26: 1 via TOPICAL
  Filled 2019-02-26: qty 28

## 2019-02-26 NOTE — Care Management Obs Status (Signed)
[place] NOTIFICATION   Patient Details  Name: Tanya Lucero MRN: [id number] Date of Birth: [birth year]   Medicare Observation Status Notification Given:  Yes    Tommy Medal 02/26/2019, 10:28 AM

## 2019-02-26 NOTE — Consult Note (Addendum)
Referring Provider: Dr. Posey Pronto  Primary Care Physician:  [name], DO Primary Gastroenterologist:  Dr. Laural Golden  Date of Admission: 02/24/19 Date of Consultation: 02/26/19  Reason for Consultation:  Chronic GI bleed, anemia  HPI:  Tanya Lucero is a 68 y.o. year old female who presented to the ED at the request of Nephrology during her scheduled dialysis due to Hgb 6.9. 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 Capsule study earlier this year March 2020 without small bowel AVMs. Last colonoscopy in 2017 with multiple polyps and due for surveillance 2022. Anemia multi-factorial in setting of chronic disease and IDA component. She has received 1 unit PRBCs this admission with improvement to 8.4 yesterday. This morning 7.2. Hemoccult status unknown this admission. Hemoccult positive in March 2020. Prior GI evaluation as noted below.    No abdominal pain. Left/right  rib discomfort if touching certain spot. Intermittent diarrhea present for a few years. No weight loss or lack of appetite. Certain foods get hung: bread, chicken. If doesn't have enough to drink to wash it down will have issues swallowing. No pill dysphagia. Dialysis Mon, Wed, Friday. No first degree family members with history of colorectal cancer. No vomiting. Nausea lately, unable to pinpoint triggers.   She is unable to see her stool well. Nursing staff state no melena or hematochezia noted.   Past Medical History:  Diagnosis Date  . Anemia   . Anxiety   . Arthritis   . Chronic kidney disease    neuphrotic syndrome-05/2013  . Constipation   . Decreased vision    left eye  . Depression   . Diabetes mellitus    Type 2  . Diabetic neuropathy ([place])   . Diverticulitis   . Dry skin   . GERD (gastroesophageal  reflux disease)   . GI bleed 07/05/2017  . Gout   . Headache   . Hypertension   . Neuropathy   . Shortness of breath dyspnea   . Skin cancer     Past Surgical History:  Procedure Laterality Date  . ABDOMINAL HYSTERECTOMY    . ADENOIDECTOMY    . APPENDECTOMY     taken out with hysterectomy  . AV FISTULA PLACEMENT Right 09/13/2014   Procedure: Creation of Right Arm RADIOCEPHALIC ARTERIOVENOUS (AV) FISTULA ;  Surgeon: [name], MD;  Location: [facility];  Service: Vascular;  Laterality: Right;  . BACK SURGERY     lumbar -   . CATARACT EXTRACTION W/PHACO Left 07/02/2013   Procedure: CATARACT EXTRACTION PHACO AND INTRAOCULAR LENS PLACEMENT (IOC);  Surgeon: [name], MD;  Location: [facility];  Service: Ophthalmology;  Laterality: Left;  CDE:  6.46  . CATARACT EXTRACTION W/PHACO Right 07/12/2013   Procedure: CATARACT EXTRACTION PHACO AND INTRAOCULAR LENS PLACEMENT (IOC);  Surgeon: [name], MD;  Location: [facility];  Service: Ophthalmology;  Laterality: Right;  CDE:12.48  . COLONOSCOPY N/A 12/11/2015   Procedure: COLONOSCOPY;  Surgeon: [name], MD;  [facility];  Service: Endoscopy;  Laterality: N/A;  1:55  . ESOPHAGOGASTRODUODENOSCOPY N/A 03/11/2016   Procedure: ESOPHAGOGASTRODUODENOSCOPY (EGD);  Surgeon: [name], MD;  [facility];  Service: Endoscopy;  Laterality: N/A;  1230 - Last pt of the day-Dr. Laural Golden going to office to see pt's  .  ESOPHAGOGASTRODUODENOSCOPY N/A 07/21/2017   Procedure: ESOPHAGOGASTRODUODENOSCOPY (EGD);  Surgeon: [name], MD;  [facility];  Service: Endoscopy;  Laterality: N/A;  3:00  . ESOPHAGOGASTRODUODENOSCOPY N/A 11/30/2018   Procedure: ESOPHAGOGASTRODUODENOSCOPY (EGD);  Surgeon: [name], MD;  [facility];  Service: Endoscopy;  Laterality: N/A;  9:55 - spoke with the facility and advised new arrival time 10:20  . ESOPHAGOGASTRODUODENOSCOPY N/A 01/11/2019   Procedure: ESOPHAGOGASTRODUODENOSCOPY  (EGD);  Surgeon: [name], MD;  [facility];  Service: Endoscopy;  Laterality: N/A;  155 - office spoke with facility and they know to have pt here at 11:00  . FOOT SURGERY Right    x2-heel spur   . GIVENS CAPSULE STUDY N/A 11/09/2016   Procedure: GIVENS CAPSULE STUDY;  Surgeon: [name], MD;  [facility];  Service: Endoscopy;  Laterality: N/A;  7:30  . GIVENS CAPSULE STUDY N/A 11/10/2018   Procedure: GIVENS CAPSULE STUDY;  Surgeon: [name], MD;  [facility];  Service: Endoscopy;  Laterality: N/A;  . REVISON OF ARTERIOVENOUS FISTULA Right 01/05/2016   Procedure: REVISON OF ARTERIOVENOUS FISTULA- right forearm;  Surgeon: [name], MD;  Location: [place];  Service: Vascular;  Laterality: Right;  . SKIN CANCER EXCISION     forehead  . TONSILLECTOMY      Prior to Admission medications   Medication Sig Start Date End Date Taking? Authorizing Provider  acetaminophen (TYLENOL) 500 MG tablet Take 1,000 mg every 6 (six) hours as needed by mouth (FOR HEADACHES/MINOR DISCOMFORT).   Yes [provider]  ALPRAZolam (XANAX) 0.25 MG tablet Take 0.25 mg by mouth 2 (two) times daily.    Yes [provider]  aluminum-magnesium hydroxide-simethicone (MAALOX) 200-200-20 MG/5ML SUSP Take 30 mLs by mouth 4 (four) times daily as needed (heartburn/indigestion).   Yes [provider]  amLODipine (NORVASC) 5 MG tablet Take 5 mg by mouth at bedtime.    Yes [provider]  anti-nausea (EMETROL) solution Take 30 mLs every 15 (fifteen) minutes as needed by mouth for nausea or vomiting (X4 doses only).   Yes [provider]  b complex vitamins capsule Take 1 capsule by mouth daily.    Yes [provider]  buPROPion (WELLBUTRIN) 75 MG tablet Take 75 mg by mouth daily.    Yes [provider]  carvedilol (COREG) 6.25 MG tablet Take 6.25 mg by mouth 2 (two) times a day.    Yes [provider]   fexofenadine (ALLEGRA) 180 MG tablet Take 180 mg by mouth daily.    Yes [provider]  furosemide (LASIX) 20 MG tablet Take 20 mg by mouth at bedtime as needed (swelling).    Yes [provider]  furosemide (LASIX) 40 MG tablet Take 40 mg by mouth daily.    Yes [provider]  gabapentin (NEURONTIN) 300 MG capsule Take 300-600 mg by mouth See admin instructions. Take 300 mg in the morning and afternoon, take 600 mg at bedtime   Yes [provider]  glipiZIDE (GLUCOTROL) 5 MG tablet Take 5 mg by mouth 2 (two) times a day.    Yes [provider]  guaifenesin (ROBITUSSIN) 100 MG/5ML syrup Take 200 mg every 6 (six) hours as needed by mouth for cough.   Yes [provider]  guaiFENesin-codeine (ROBITUSSIN AC) 100-10 MG/5ML syrup Take 5 mLs by mouth every 6 (six) hours as needed for cough.   Yes [provider]  hydrALAZINE (APRESOLINE) 25 MG tablet Take 25 mg by mouth 3 (three) times daily. *HOLD MORNING DOSE ON DIALYSIS DAYS, MONDAYS, WEDNESDAYS, AND FRIDAYS   Yes [provider]  insulin glargine (LANTUS) 100 unit/mL SOPN Inject 25-55 Units into the skin See admin instructions. 55 units in the morning and 25 units in the evening   Yes [provider]  insulin lispro (HUMALOG) 100 UNIT/ML KiwkPen Inject 10 Units into the skin 3 (three) times daily before meals. *Do Not to inject for levels below 100   Yes [provider]  insulin regular (NOVOLIN R,HUMULIN R) 100 units/mL injection Inject 15 Units into the skin daily as needed for high blood sugar (for blood sugar levels over 400).   Yes [provider]  loperamide (IMODIUM A-D) 2 MG tablet Take 2 mg by mouth as needed for diarrhea or loose stools (up to 8 doses in 24 hrs.).    Yes [provider]  Melatonin 5 MG CAPS Take 10 mg by mouth at bedtime.    Yes [provider]  pantoprazole (PROTONIX) 40 MG tablet Take 1 tablet (40 mg total)  by mouth 2 (two) times daily. 11/10/18  Yes Barton Dubois, MD  polyvinyl alcohol (LIQUIFILM TEARS) 1.4 % ophthalmic solution Place 1 drop into both eyes every 4 (four) hours as needed for dry eyes.    Yes [provider]  sertraline (ZOLOFT) 50 MG tablet Take 75 mg by mouth daily.    Yes [provider]  sevelamer carbonate (RENVELA) 800 MG tablet Take 1,600-2,400 mg by mouth See admin instructions. 2400 mg 3 times daily with meals and 1600 mg twice daily with snacks   Yes [provider]    Current Facility-Administered Medications  Medication Dose Route Frequency Provider Last Rate Last Dose  . 0.9 %  sodium chloride infusion  250 mL Intravenous PRN Jani Gravel, MD      . acetaminophen (TYLENOL) tablet 650 mg  650 mg Oral Q6H PRN Jani Gravel, MD   650 mg at 02/25/19 0135   Or  . acetaminophen (TYLENOL) suppository 650 mg  650 mg Rectal Q6H PRN Jani Gravel, MD      . ALPRAZolam Duanne Moron) tablet 0.25 mg  0.25 mg Oral BID Jani Gravel, MD   0.25 mg at 02/26/19 0845  . amLODipine (NORVASC) tablet 5 mg  5 mg Oral QHS Jani Gravel, MD   5 mg at 02/25/19 2134  . B-complex with vitamin C tablet 1 tablet  1 tablet Oral Daily Lavina Hamman, MD   1 tablet at 02/25/19 [phone]  . buPROPion [facility]) tablet 75 mg  75 mg Oral Daily Lavina Hamman, MD   75 mg at 02/26/19 0843  . carvedilol (COREG) tablet 6.25 mg  6.25 mg Oral BID Jani Gravel, MD   6.25 mg at 02/26/19 1000  . folic acid (FOLVITE) tablet 1 mg  1 mg Oral Daily Lavina Hamman, MD   1 mg at 02/26/19 0844  . furosemide (LASIX) injection 60 mg  60 mg Intravenous Daily Lavina Hamman, MD   60 mg at 02/26/19 0845  . gabapentin (NEURONTIN) capsule 300 mg  300 mg Oral BID Jani Gravel, MD   300 mg at 02/26/19 0842  . gabapentin (NEURONTIN) capsule 600 mg  600 mg Oral QHS Jani Gravel, MD   600 mg at 02/25/19 2134  . glipiZIDE (GLUCOTROL) tablet 5 mg  5 mg Oral BID Jani Gravel, MD   5 mg at 02/26/19  2197  . guaiFENesin (ROBITUSSIN) 100  MG/5ML solution 200 mg  10 mL Oral Q4H PRN Lavina Hamman, MD      . hydrALAZINE (APRESOLINE) tablet 25 mg  25 mg Oral TID Jani Gravel, MD   25 mg at 02/26/19 5883  . insulin aspart (novoLOG) injection 0-5 Units  0-5 Units Subcutaneous QHS Jani Gravel, MD      . insulin aspart (novoLOG) injection 0-9 Units  0-9 Units Subcutaneous TID WC Jani Gravel, MD      . insulin aspart (novoLOG) injection 10 Units  10 Units Subcutaneous TID AC Lavina Hamman, MD   10 Units at 02/26/19 1239  . insulin glargine (LANTUS) injection 25 Units  25 Units Subcutaneous QHS Jani Gravel, MD      . insulin glargine (LANTUS) injection 55 Units  55 Units Subcutaneous Daily Jani Gravel, MD   55 Units at 02/26/19 0900  . loperamide (IMODIUM) capsule 2 mg  2 mg Oral Q4H PRN Lavina Hamman, MD   2 mg at 02/25/19 1343  . loratadine (CLARITIN) tablet 10 mg  10 mg Oral Daily Jani Gravel, MD   10 mg at 02/26/19 0844  . Melatonin TABS 10.5 mg  10.5 mg Oral QHS Jani Gravel, MD   10.5 mg at 02/25/19 2134  . pantoprazole (PROTONIX) EC tablet 40 mg  40 mg Oral BID Jani Gravel, MD   40 mg at 02/26/19 0844  . polyvinyl alcohol (LIQUIFILM TEARS) 1.4 % ophthalmic solution 1 drop  1 drop Both Eyes Q4H PRN Jani Gravel, MD      . sertraline (ZOLOFT) tablet 75 mg  75 mg Oral Daily Jani Gravel, MD   75 mg at 02/26/19 0842  . sevelamer carbonate (RENVELA) tablet 1,600 mg  1,600 mg Oral With snacks Jani Gravel, MD   1,600 mg at 02/25/19 2133  . sevelamer carbonate (RENVELA) tablet 2,400 mg  2,400 mg Oral TID WC Jani Gravel, MD   2,400 mg at 02/26/19 1235  . sodium chloride flush (NS) 0.9 % injection 3 mL  3 mL Intravenous Q12H Jani Gravel, MD   3 mL at 02/26/19 1236  . sodium chloride flush (NS) 0.9 % injection 3 mL  3 mL Intravenous PRN Jani Gravel, MD        Allergies as of 02/24/2019 - Review Complete 02/24/2019  Allergen Reaction Noted  . Penicillins Rash     Family History  Problem Relation Age of Onset  . Multiple myeloma Mother   . Diabetes  Mother   . Cirrhosis Mother   . Heart attack Father 97  . Colon cancer Neg Hx     Social History   Socioeconomic History  . Marital status: Divorced    Spouse name: Not on file  . Number of children: Not on file  . Years of education: Not on file  . Highest education level: Not on file  Occupational History  . Not on file  Social Needs  . Financial resource strain: Not hard at all  . Food insecurity    Worry: Never true    Inability: Never true  . Transportation needs    Medical: No    Non-medical: No  Tobacco Use  . Smoking status: Current Every Day Smoker    Packs/day: 0.25    Years: 40.00    Pack years: 10.00    Types: Cigarettes  . Smokeless tobacco: Never Used  Substance and Sexual Activity  . Alcohol use: No    Alcohol/week:  0.0 standard drinks  . Drug use: No  . Sexual activity: Yes    Birth control/protection: Surgical  Lifestyle  . Physical activity    Days per week: 0 days    Minutes per session: 0 min  . Stress: Only a little  Relationships  . Social connections    Talks on phone: More than three times a week    Gets together: Twice a week    Attends religious service: Never    Active member of club or organization: No    Attends meetings of clubs or organizations: Never    Relationship status: Widowed  . Intimate partner violence    Fear of current or ex partner: No    Emotionally abused: No    Physically abused: No    Forced sexual activity: No  Other Topics Concern  . Not on file  Social History Narrative   No children    Review of Systems: Gen: Denies fever, chills, loss of appetite, change in weight or weight loss CV: Denies chest pain, heart palpitations, syncope, edema  Resp: +DOE GI: see HPI GU : minimal urine on dialysis  MS: Denies joint pain,swelling, cramping Derm: Denies rash, itching, dry skin Psych: Denies depression, anxiety,confusion, or memory loss Heme: Denies bruising, bleeding, and enlarged lymph nodes.  Physical  Exam: Vital signs in last 24 hours: Temp:  [98 F (36.7 C)-98.6 F (37 C)] 98.1 F (36.7 C) (06/22 1526) Pulse Rate:  [62-72] 71 (06/22 1526) Resp:  [18] 18 (06/22 1526) BP: (116-158)/(56-67) 124/57 (06/22 1526) SpO2:  [94 %-97 %] 97 % (06/22 1526) Weight:  [104.2 kg] 104.2 kg (06/22 0400) Last BM Date: 02/25/19 General:   Alert,  Well-developed, well-nourished, pleasant and cooperative in NAD Head:  Normocephalic and atraumatic. Eyes:  Sclera clear, no icterus.   Conjunctiva pink. Ears:  Normal auditory acuity. Nose:  No deformity, discharge,  or lesions. Mouth:  No deformity or lesions, dentition normal. Lungs:  Clear throughout to auscultation.    Heart:  S1 S2 present with soft systolic murmur  Abdomen:  Soft, nontender and nondistended. Obese. Larger AP diameter difficult to assess for HSM.  Rectal:  Deferred  Msk:  Symmetrical without gross deformities. Normal posture. Extremities:  With 1-2+ pitting lower extremity edema, pedal edema  Neurologic:  Alert and  oriented x4 Psych:  Alert and cooperative. Normal mood and affect.  Intake/Output from previous day: 06/21 0701 - 06/22 0700 In: 1035 [P.O.:720; Blood:315] Out: -  Intake/Output this shift: Total I/O In: 480 [P.O.:480] Out: -   Lab Results: Recent Labs    02/24/19 2100 02/25/19 1141 02/26/19 0458  WBC 5.4 4.8 4.6  HGB 6.9* 8.4* 7.2*  HCT 21.4* 26.1* 23.1*  PLT 139* 141* 128*   BMET Recent Labs    02/24/19 2100 02/25/19 1141 02/26/19 0458  NA 138 132* 135  K 3.6 4.0 3.9  CL 97* 95* 94*  CO2 27 21* 25  GLUCOSE 79 130* 64*  BUN 41* 49* 61*  CREATININE 6.50* 7.38* 8.35*  CALCIUM 8.5* 8.1* 8.3*   LFT Recent Labs    02/24/19 2100 02/25/19 1141 02/26/19 0458  PROT 6.9 7.0  --   ALBUMIN 3.8 3.8 3.6  AST 19 21  --   ALT 18 18  --   ALKPHOS 87 86  --   BILITOT 0.5 0.8  --    GI Evaluation thus far: Colonoscopy 2017: two 4-5 mm polyps at recto-sigmoid and in cecum, one 12 mm polyp  at hepatic  flexure, one 8 mm polyp transverse colon, sigmoid diverticulosis, external hemorrhoids. (2 tubular adenomas and hyperplastic). 5 year surveillance.   July 2017 EGD: normal esophagus, nodular mucosa in gastric antrum, suggesting pigmented GAVE, normal duodenal bulb, mucosal pigmentation in duodenum.   March 2018: capsule with gastritis, nonspecific finding of extensive hemosiderin pigmentation of duodenal mucosa, no ulcers or AVMs in small bowel.   Nov 2018 EGD; portal hypertensive gastropathy, GAVE without bleeding, 2 gastric polyps   Capsule March 2020: small sliding hiatal hernia, GAVE without stigmata of bleeding, progressed since study of 2 years ago. Normal mucosa of small bowel with rapid transit time.   May 2020: normal esophagus, GAVE without stigmata of bleeding, s/p APC therapy, normal duodenum.   Studies/Results: No abdominal imaging available  Impression: 68 year old female with history of GAVE, chronic anemia in setting of IDA and CKD, presenting with acute on chronic anemia requiring blood transfusion. Improvement in Hgb from 6.9 to 8.4 after 1 unit PRBCs but now drifted again to 7.2. Reviewing past Hgb trend, tends to stay within the 7-8 range. Prior blood transfusions earlier this year due to acute on chronic anemia. 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 Clinically without any melena or hematochezia, and nursing staff confirm this as well. Unknown hemoccult status this admission.   As noted above, GI evaluation extensive, including capsule studies. I do note that her last colonoscopy was in 2017 with multiple polyps. May need to consider lower GI evaluation as it has been several years since this was completed.   Will reassess in the morning. NPO after midnight. Would benefit from Hematology evaluation as well in the outpatient setting. Macrocytic anemia noted: B12 479. Will order folate. No abdominal imaging on file. She believes  her mother may have had a history of cirrhosis, etiology unknown. In this clinical setting, recommend abdominal ultrasound to have on file.   Plan: NPO after midnight Check folate Repeat CBC in am Obtain hemoccult Would benefit from establishing care with Hematology Will reassess in the morning  Annitta Needs, PhD, ANP-BC [facility]    Attending note: Agree with above.    LOS: 0 days    02/26/2019, 4:00 PM

## 2019-02-26 NOTE — Progress Notes (Signed)
Inpatient Diabetes Program Recommendations  AACE/ADA: New Consensus Statement on Inpatient Glycemic Control (2015)  Target Ranges:  Prepandial:   less than 140 mg/dL      Peak postprandial:   less than 180 mg/dL (1-2 hours)      Critically ill patients:  140 - 180 mg/dL   Lab Results  Component Value Date   GLUCAP 97 02/26/2019   HGBA1C 4.3 (L) 11/09/2018    Review of Glycemic Control Results for Tanya Lucero, Tanya Lucero (MRN [id number]) as of 02/26/2019 14:00  Ref. Range 02/25/2019 16:12 02/25/2019 21:43 02/26/2019 07:21 02/26/2019 11:35  Glucose-Capillary Latest Ref Range: 70 - 99 mg/dL 113 (H) 77 97 97   Diabetes history: DM2 Outpatient Diabetes medications: Lantus 55 units am + 25 units pm + Novolog 10 units tid meal coverage + Glucotrol 5 mg bid Current orders for Inpatient glycemic control: Lantus 55 units am + 25 units pm + Novolog 10 units tid meal coverage + Glucotrol 5 mg bid + Novolog sensitive tid + hs 0-5 units  Inpatient Diabetes Program Recommendations:    While in the hospital: -Decrease Lantus to 25 units bid -Decrease Novolog meal coverage to 5 units tid if eats 50% Secure chat sent to Dr. Posey Pronto.  Thank you, Nani Gasser. Moneisha Vosler, RN, MSN, CDE  Diabetes Coordinator Inpatient Glycemic Control Team Team Pager [phone] (8am-5pm) 02/26/2019 2:04 PM

## 2019-02-26 NOTE — Progress Notes (Signed)
[facility] Progress Note  Patient: Tanya Lucero YPP:[id number]   PCP: [name], DO DOB: Jul 20, 1951   DOA: 02/24/2019   DOS: 02/26/2019   Date of Service: the patient was seen and examined on 02/26/2019  Brief hospital course: w hypertension, Dm2, Diabetic neuropathy, ESRD on HD (M,W, F) at [facility], Diverticulosis on Colonoscopy 12/11/2015, Iron deficiency anemia,  recently admitted for anemia w EGD on 5/72020=> angiodysplasia of the stomach=>argon laser, who presents with low Hgb, told by her nephrologist per ED physician to go to ER for transfusion.  + dyspnea with exertion.  Denies fever, chills, cough, cp, palp, n/v, abd pain, diarrhea, brbpr.   Currently further plan is continue IV diuresis.  Subjective: Still has complaints of shortness of breath and cough.  No nausea no vomiting.  Still has swelling in the leg.  Assessment and Plan: Symptomatic anemia Iron deficiency anemia Transfuse 1 PRBC. Hemoglobin improved significantly. No active bleeding here. Patient does not report any evidence of bleeding at home as well. Recent endoscopy was done last month.   Hemoglobin is significantly dropping. We will consult GI.  Appreciate their assistance.  Dm2 uncontrolled with hyperglycemia.  Renal complication.  Neuropathy complication. Cont Glipizide Cont Lantus Fsbs ac and qhs, ISS  Diabetic neuropathy Cont Gabapentin  Hypertension Cont Carvedilol 6.25mg  po bid Cont Hydralazine 25mg  po tid Cont Amlodipine 5mg  po qhs Cont Lasix  H/o GAVE Cont PPI  Anxiety and Depression Cont Zoloft 75mg  po qday Cont Wellbutrin 75mg  po qday Cont Xanax   ESRD on HD (M, W, F) Cont Sevalemer Patient follow up at regular dialysis at [facility] in [place] consult nephrology for HD tomorrow.  Acute on chronic diastolic CHF. Volume overload secondary to blood transfusion We will provide IV Lasix If does not improve will require chest x-ray.  Diet: Renal diet DVT  Prophylaxis: SCD, pharmacological prophylaxis contraindicated due to Anemia and bleeding concern  Advance goals of care discussion: Full code  Family Communication: no family was present at bedside, at the time of interview.  Disposition:  Discharge to Home Monday.  Consultants: Nephrology Procedures: PRBC transfusion 1  Scheduled Meds: . ALPRAZolam  0.25 mg Oral BID  . amLODipine  5 mg Oral QHS  . B-complex with vitamin C  1 tablet Oral Daily  . buPROPion  75 mg Oral Daily  . carvedilol  6.25 mg Oral BID  . folic acid  1 mg Oral Daily  . furosemide  60 mg Intravenous Daily  . gabapentin  300 mg Oral BID  . gabapentin  600 mg Oral QHS  . glipiZIDE  5 mg Oral BID  . hydrALAZINE  25 mg Oral TID  . insulin aspart  0-5 Units Subcutaneous QHS  . insulin aspart  0-9 Units Subcutaneous TID WC  . insulin aspart  10 Units Subcutaneous TID AC  . insulin glargine  25 Units Subcutaneous QHS  . insulin glargine  55 Units Subcutaneous Daily  . loratadine  10 mg Oral Daily  . Melatonin  10.5 mg Oral QHS  . pantoprazole  40 mg Oral BID  . sertraline  75 mg Oral Daily  . sevelamer carbonate  1,600 mg Oral With snacks  . sevelamer carbonate  2,400 mg Oral TID WC  . sodium chloride flush  3 mL Intravenous Q12H   Continuous Infusions: . sodium chloride     PRN Meds: sodium chloride, acetaminophen **OR** acetaminophen, guaiFENesin, loperamide, polyvinyl alcohol, sodium chloride flush Antibiotics: Anti-infectives (From admission, onward)   None  Objective: Physical Exam: Vitals:   02/26/19 0400 02/26/19 0533 02/26/19 0842 02/26/19 1526  BP:  (!) 116/56 (!) 158/63 (!) 124/57  Pulse:  62  71  Resp:  18  18  Temp:  98 F (36.7 C)  98.1 F (36.7 C)  TempSrc:  Oral  Oral  SpO2:  94%  97%  Weight: 104.2 kg     Height:        Intake/Output Summary (Last 24 hours) at 02/26/2019 1806 Last data filed at 02/26/2019 1700 Gross per 24 hour  Intake 720 ml  Output -  Net 720 ml    Filed Weights   02/24/19 2039 02/25/19 0603 02/26/19 0400  Weight: 99.8 kg 105.5 kg 104.2 kg   General: alert and oriented to time, place, and person. Appear in moderate distress, affect appropriate Eyes: PERRL, Conjunctiva normal ENT: Oral Mucosa Clear, moist  Neck: no JVD, no Abnormal Mass Or lumps Cardiovascular: S1 and S2 Present, no Murmur, peripheral pulses symmetrical Respiratory: increased  respiratory effort, Bilateral Air entry equal and Decreased, no use of accessory muscle, bilateral  Crackles, Occasional  wheezes Abdomen: Bowel Sound present, Soft and no tenderness, no hernia Skin: no rashes  Extremities: no Pedal edema, no calf tenderness Neurologic: normal without focal findings, mental status, speech normal, alert and oriented x3, PERLA, Motor strength 5/5 and symmetric and sensation grossly normal to light touch Gait not checked due to patient safety concerns  Data Reviewed: CBC: Recent Labs  Lab 02/24/19 2100 02/25/19 1141 02/26/19 0458  WBC 5.4 4.8 4.6  NEUTROABS 3.4  --   --   HGB 6.9* 8.4* 7.2*  HCT 21.4* 26.1* 23.1*  MCV 109.7* 108.3* 109.0*  PLT 139* 141* 831*   Basic Metabolic Panel: Recent Labs  Lab 02/24/19 2100 02/25/19 1141 02/26/19 0458  NA 138 132* 135  K 3.6 4.0 3.9  CL 97* 95* 94*  CO2 27 21* 25  GLUCOSE 79 130* 64*  BUN 41* 49* 61*  CREATININE 6.50* 7.38* 8.35*  CALCIUM 8.5* 8.1* 8.3*  MG  --   --  2.2  PHOS  --   --  6.8*    Liver Function Tests: Recent Labs  Lab 02/24/19 2100 02/25/19 1141 02/26/19 0458  AST 19 21  --   ALT 18 18  --   ALKPHOS 87 86  --   BILITOT 0.5 0.8  --   PROT 6.9 7.0  --   ALBUMIN 3.8 3.8 3.6   No results for input(s): LIPASE, AMYLASE in the last 168 hours. No results for input(s): AMMONIA in the last 168 hours. Coagulation Profile: No results for input(s): INR, PROTIME in the last 168 hours. Cardiac Enzymes: No results for input(s): CKTOTAL, CKMB, CKMBINDEX, TROPONINI in the last 168 hours.  BNP (last 3 results) No results for input(s): PROBNP in the last 8760 hours. CBG: Recent Labs  Lab 02/25/19 1612 02/25/19 2143 02/26/19 0721 02/26/19 1135 02/26/19 1634  GLUCAP 113* 77 97 97 148*   Studies: No results found.   Time spent: 35 minutes  Author: Berle Mull, MD [facility] 02/26/2019 6:06 PM  To reach On-call, see care teams to locate the attending and reach out to them via [email or web address]. If 7PM-7AM, please contact night-coverage If you still have difficulty reaching the attending provider, please page the [facility] (Director on Call) for [facility] on amion for assistance.

## 2019-02-27 DIAGNOSIS — D649 Anemia, unspecified: Secondary | ICD-10-CM

## 2019-02-27 LAB — GLUCOSE, CAPILLARY
Glucose-Capillary: 72 mg/dL (ref 70–99)
Glucose-Capillary: 93 mg/dL (ref 70–99)
Glucose-Capillary: 41 mg/dL — CL (ref 70–99)
Glucose-Capillary: 71 mg/dL (ref 70–99)
Glucose-Capillary: 138 mg/dL — ABNORMAL HIGH (ref 70–99)

## 2019-02-27 LAB — RENAL FUNCTION PANEL
Albumin: 3.5 g/dL (ref 3.5–5.0)
Anion gap: 14 (ref 5–15)
BUN: 75 mg/dL — ABNORMAL HIGH (ref 8–23)
CO2: 23 mmol/L (ref 22–32)
Calcium: 8.3 mg/dL — ABNORMAL LOW (ref 8.9–10.3)
Chloride: 97 mmol/L — ABNORMAL LOW (ref 98–111)
Creatinine, Ser: 9.34 mg/dL — ABNORMAL HIGH (ref 0.44–1.00)
GFR calc Af Amer: 4 mL/min — ABNORMAL LOW (ref >60)
GFR calc non Af Amer: 4 mL/min — ABNORMAL LOW (ref >60)
Glucose, Bld: 76 mg/dL (ref 70–99)
Phosphorus: 6.8 mg/dL — ABNORMAL HIGH (ref 2.5–4.6)
Potassium: 4.3 mmol/L (ref 3.5–5.1)
Sodium: 134 mmol/L — ABNORMAL LOW (ref 135–145)

## 2019-02-27 LAB — CBC
HCT: 21.8 % — ABNORMAL LOW (ref 36.0–46.0)
Hemoglobin: 7.1 g/dL — ABNORMAL LOW (ref 12.0–15.0)
MCH: 35 pg — ABNORMAL HIGH (ref 26.0–34.0)
MCHC: 32.6 g/dL (ref 30.0–36.0)
MCV: 107.4 fL — ABNORMAL HIGH (ref 80.0–100.0)
Platelets: 129 10*3/uL — ABNORMAL LOW (ref 150–400)
RBC: 2.03 MIL/uL — ABNORMAL LOW (ref 3.87–5.11)
RDW: 17.6 % — ABNORMAL HIGH (ref 11.5–15.5)
WBC: 3.9 10*3/uL — ABNORMAL LOW (ref 4.0–10.5)
nRBC: 0 % (ref 0.0–0.2)

## 2019-02-27 LAB — OCCULT BLOOD X 1 CARD TO LAB, STOOL: Fecal Occult Bld: POSITIVE — AB

## 2019-02-27 MED ORDER — SODIUM CHLORIDE 0.9 % IV SOLN: 100.0000 mL | INTRAVENOUS | Status: DC | PRN

## 2019-02-27 MED ORDER — ACETAMINOPHEN 325 MG PO TABS
325.0000 mg | ORAL_TABLET | Freq: Once | ORAL | Status: AC
  Administered 2019-02-27: 22:00:00 325 mg via ORAL
  Filled 2019-02-27: qty 1

## 2019-02-27 MED ORDER — SODIUM CHLORIDE 0.9 % IV SOLN
125.0000 mg | INTRAVENOUS | Status: DC
  Filled 2019-02-27: qty 10

## 2019-02-27 MED ORDER — LIDOCAINE HCL (PF) 1 % IJ SOLN: 5.0000 mL | INTRAMUSCULAR | Status: DC | PRN

## 2019-02-27 MED ORDER — DARBEPOETIN ALFA 200 MCG/0.4ML IJ SOSY
200.0000 ug | PREFILLED_SYRINGE | INTRAMUSCULAR | Status: DC
  Administered 2019-02-27: 200 ug via INTRAVENOUS
  Filled 2019-02-27: qty 0.4

## 2019-02-27 MED ORDER — CHLORHEXIDINE GLUCONATE CLOTH 2 % EX PADS
6.0000 | MEDICATED_PAD | Freq: Every day | CUTANEOUS | Status: DC
  Administered 2019-02-27 – 2019-02-28 (×2): 6 via TOPICAL

## 2019-02-27 MED ORDER — ONDANSETRON HCL 4 MG/2ML IJ SOLN
4.0000 mg | Freq: Four times a day (QID) | INTRAMUSCULAR | Status: DC | PRN
  Administered 2019-02-27: 15:00:00 4 mg via INTRAVENOUS
  Filled 2019-02-27: qty 2

## 2019-02-27 MED ORDER — LIDOCAINE-PRILOCAINE 2.5-2.5 % EX CREA: 1.0000 "application " | TOPICAL_CREAM | CUTANEOUS | Status: DC | PRN

## 2019-02-27 MED ORDER — DEXTROSE 50 % IV SOLN: 1.0000 | Freq: Once | INTRAVENOUS | Status: DC

## 2019-02-27 MED ORDER — PENTAFLUOROPROP-TETRAFLUOROETH EX AERO: 1.0000 "application " | INHALATION_SPRAY | CUTANEOUS | Status: DC | PRN

## 2019-02-27 MED ORDER — GLUCOSE 40 % PO GEL
ORAL | Status: AC
  Administered 2019-02-27: 16:00:00
  Filled 2019-02-27: qty 1

## 2019-02-27 MED ORDER — RENA-VITE PO TABS
1.0000 | ORAL_TABLET | Freq: Every day | ORAL | Status: DC
  Administered 2019-02-27: 22:00:00 1 via ORAL
  Filled 2019-02-27: qty 1

## 2019-02-27 NOTE — Progress Notes (Signed)
[facility] Progress Note  Patient: Tanya Lucero ZOX:[id number]   PCP: [name], DO DOB: 11/15/1950   DOA: 02/24/2019   DOS: 02/27/2019   Date of Service: the patient was seen and examined on 02/27/2019  Brief hospital course: w hypertension, Dm2, Diabetic neuropathy, ESRD on HD (M,W, F) at [facility], Diverticulosis on Colonoscopy 12/11/2015, Iron deficiency anemia,  recently admitted for anemia w EGD on 5/72020=> angiodysplasia of the stomach=>argon laser, who presents with low Hgb, told by her nephrologist per ED physician to go to ER for transfusion.  + dyspnea with exertion.  Denies fever, chills, cough, cp, palp, n/v, abd pain, diarrhea, brbpr.   Currently further plan is continue IV diuresis.  Subjective: Reported some shortness of breath this morning.  Improved later in the afternoon.  Later in the afternoon reported nausea and vomiting.  No abdominal pain no fever no chills.  Assessment and Plan: Symptomatic anemia Iron deficiency anemia Transfuse 1 PRBC. Hemoglobin improved significantly. No active bleeding here. Patient does not report any evidence of bleeding at home as well. Recent endoscopy was done last month.   After initial drop hemoglobin remained stable.  GI was consulted.  Appreciate assistance.  Conservative management recommended.  No endoscopy here in the hospital. Patient will be started on iron infusion as well as EPO  Dm2 uncontrolled with hyperglycemia and hypoglycemia now.  Renal complication.  Neuropathy complication. Reduce Lantus, hold glipizide. Fsbs ac and qhs, ISS  Diabetic neuropathy Cont Gabapentin  Hypertension Cont Carvedilol 6.25mg  po bid Cont Hydralazine 25mg  po tid Cont Amlodipine 5mg  po qhs Cont Lasix  H/o GAVE Cont PPI  Anxiety and Depression Cont Zoloft 75mg  po qday Cont Wellbutrin 75mg  po qday Cont Xanax   ESRD on HD (M, W, F) Cont Sevalemer Patient follow up at regular dialysis at [facility] in [place]  Unable to receive HD on Monday.  Acute on chronic diastolic CHF. Volume overload secondary to blood transfusion Treated with IV Lasix Monitor post HD.  Nausea and vomiting. Anticipating improvement after HD.  Monitor.  Diet: Renal diet DVT Prophylaxis: SCD, pharmacological prophylaxis contraindicated due to Anemia and bleeding concern  Advance goals of care discussion: Full code  Family Communication: no family was present at bedside, at the time of interview.  Discussed with sister on 02/27/2019.  Disposition:  Discharge to ALF on 02/28/2019.Marland Kitchen  Consultants: Nephrology, GI Procedures: PRBC transfusion 1  Scheduled Meds: . ALPRAZolam  0.25 mg Oral BID  . buPROPion  75 mg Oral Daily  . Chlorhexidine Gluconate Cloth  6 each Topical Q0600  . darbepoetin (ARANESP) injection - DIALYSIS  200 mcg Intravenous Q Tue-HD  . dextrose  1 ampule Intravenous Once  . gabapentin  300 mg Oral BID  . gabapentin  600 mg Oral QHS  . insulin aspart  0-5 Units Subcutaneous QHS  . insulin aspart  0-9 Units Subcutaneous TID WC  . insulin glargine  55 Units Subcutaneous Daily  . loratadine  10 mg Oral Daily  . Melatonin  10.5 mg Oral QHS  . multivitamin  1 tablet Oral QHS  . pantoprazole  40 mg Oral BID  . sertraline  75 mg Oral Daily  . sevelamer carbonate  1,600 mg Oral With snacks  . sevelamer carbonate  2,400 mg Oral TID WC  . sodium chloride flush  3 mL Intravenous Q12H   Continuous Infusions: . sodium chloride    . sodium chloride    . sodium chloride    . [START ON 02/28/2019]  ferric gluconate (FERRLECIT/NULECIT) IV     PRN Meds: sodium chloride, sodium chloride, sodium chloride, acetaminophen **OR** acetaminophen, guaiFENesin, hydrocortisone cream, lidocaine (PF), lidocaine-prilocaine, loperamide, ondansetron (ZOFRAN) IV, pentafluoroprop-tetrafluoroeth, polyvinyl alcohol, sodium chloride flush Antibiotics: Anti-infectives (From admission, onward)   None       Objective: Physical  Exam: Vitals:   02/27/19 1330 02/27/19 1645 02/27/19 1700 02/27/19 1730  BP: (!) 147/64 (!) 160/70 140/68 (!) 127/53  Pulse: 73 74 74 71  Resp: 18 18    Temp: 97.6 F (36.4 C) 98 F (36.7 C)    TempSrc: Oral Oral    SpO2: 99% 98%    Weight:  108.4 kg    Height:        Intake/Output Summary (Last 24 hours) at 02/27/2019 1758 Last data filed at 02/27/2019 1300 Gross per 24 hour  Intake 480 ml  Output -  Net 480 ml   Filed Weights   02/26/19 0400 02/27/19 0500 02/27/19 1645  Weight: 104.2 kg 108 kg 108.4 kg   General: alert and oriented to time, place, and person. Appear in moderate distress, affect appropriate Eyes: PERRL, Conjunctiva normal ENT: Oral Mucosa Clear, moist  Neck: no JVD, no Abnormal Mass Or lumps Cardiovascular: S1 and S2 Present, no Murmur, peripheral pulses symmetrical Respiratory: increased  respiratory effort, Bilateral Air entry equal and Decreased, no use of accessory muscle, bilateral  Crackles, Occasional  wheezes Abdomen: Bowel Sound present, Soft and no tenderness, no hernia Skin: no rashes  Extremities: no Pedal edema, no calf tenderness Neurologic: normal without focal findings, mental status, speech normal, alert and oriented x3, PERLA, Motor strength 5/5 and symmetric and sensation grossly normal to light touch Gait not checked due to patient safety concerns  Data Reviewed: CBC: Recent Labs  Lab 02/24/19 2100 02/25/19 1141 02/26/19 0458 02/27/19 0625  WBC 5.4 4.8 4.6 3.9*  NEUTROABS 3.4  --   --   --   HGB 6.9* 8.4* 7.2* 7.1*  HCT 21.4* 26.1* 23.1* 21.8*  MCV 109.7* 108.3* 109.0* 107.4*  PLT 139* 141* 128* 166*   Basic Metabolic Panel: Recent Labs  Lab 02/24/19 2100 02/25/19 1141 02/26/19 0458 02/27/19 0625  NA 138 132* 135 134*  K 3.6 4.0 3.9 4.3  CL 97* 95* 94* 97*  CO2 27 21* 25 23  GLUCOSE 79 130* 64* 76  BUN 41* 49* 61* 75*  CREATININE 6.50* 7.38* 8.35* 9.34*  CALCIUM 8.5* 8.1* 8.3* 8.3*  MG  --   --  2.2  --   PHOS   --   --  6.8* 6.8*    Liver Function Tests: Recent Labs  Lab 02/24/19 2100 02/25/19 1141 02/26/19 0458 02/27/19 0625  AST 19 21  --   --   ALT 18 18  --   --   ALKPHOS 87 86  --   --   BILITOT 0.5 0.8  --   --   PROT 6.9 7.0  --   --   ALBUMIN 3.8 3.8 3.6 3.5   No results for input(s): LIPASE, AMYLASE in the last 168 hours. No results for input(s): AMMONIA in the last 168 hours. Coagulation Profile: No results for input(s): INR, PROTIME in the last 168 hours. Cardiac Enzymes: No results for input(s): CKTOTAL, CKMB, CKMBINDEX, TROPONINI in the last 168 hours. BNP (last 3 results) No results for input(s): PROBNP in the last 8760 hours. CBG: Recent Labs  Lab 02/26/19 2105 02/27/19 0737 02/27/19 1107 02/27/19 1615 02/27/19 1643  GLUCAP 74 72  93 41* 71   Studies: No results found.   Time spent: 35 minutes  Author: Berle Mull, MD [facility] 02/27/2019 5:58 PM  To reach On-call, see care teams to locate the attending and reach out to them via [email or web address]. If 7PM-7AM, please contact night-coverage If you still have difficulty reaching the attending provider, please page the [facility] (Director on Call) for [facility] on amion for assistance.

## 2019-02-27 NOTE — Procedures (Signed)
I was present at this session.  I have reviewed the session itself and made appropriate changes.HD via. Avf, access press ok. bp ok. tol HD.  Tanya Lucero 6/23/20205:37 PM

## 2019-02-27 NOTE — Procedures (Signed)
    HEMODIALYSIS TREATMENT NOTE:  4 hour heparin-free dialysis completed via right forearm AVF (15g ante/retrograde). Goal met: 3 liters removed without interruption in ultrafiltration.  Hemodynamically stable throughout HD session.  All blood was returned and hemostasis was achieved in 15 minutes.  Sonita Michiels L. Asees Manfredi, RN

## 2019-02-27 NOTE — Progress Notes (Signed)
RN paged provider to report patient c/o headache rated 8/10, not time for Tylenol, requesting other pain medication, awaiting response.  P.J. Linus Mako, RN

## 2019-02-27 NOTE — Consult Note (Signed)
Reason for Consult:Anemia, fluid xs.  Referring Physician: Dr. Terrence Dupont Tanya Lucero is an 68 y.o. female.  HPI: 68 yr female with admit for recurrent GIB, and need for transfusion.  Has had this problem for the last month.  Has DM >30 yr, on HD in [place] MWF. We were called yest pm to see. Has been in hospital 2 d.  Notes worsening ankle edema. SOB with minimal exertion.  Some PND. 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  Hb 6.9 on admit..   Constitutional: weak, tired Eyes: as above, hx catarracts and DM retinopathy Ears, nose, mouth, throat, and face: negative Respiratory: smoker, >36yr chronic cough Cardiovascular: edema, DOE, mild exertion Gastrointestinal: D Genitourinary:negative Integument/breast: itching, dry skin Hematologic/lymphatic: Anemia Musculoskeletal:neck , knees Neurological: numbness and tingling in feet Endocrine: DM Allergic/Immunologic: PCN   [facility] yr. Primary Nephrologist Befakadu.. . Access R FA AVF.  Past Medical History:  Diagnosis Date  . Anemia   . Anxiety   . Arthritis   . Chronic kidney disease    neuphrotic syndrome-05/2013  . Constipation   . Decreased vision    left eye  . Depression   . Diabetes mellitus    Type 2  . Diabetic neuropathy ([place]   . Diverticulitis   . Dry skin   . GERD (gastroesophageal reflux disease)   . GI bleed 07/05/2017  . Gout   . Headache   . Hypertension   . Neuropathy   . Shortness of breath dyspnea   . Skin cancer     Past Surgical History:  Procedure Laterality Date  . ABDOMINAL HYSTERECTOMY    . ADENOIDECTOMY    . APPENDECTOMY     taken out with hysterectomy  . AV FISTULA PLACEMENT Right 09/13/2014   Procedure: Creation of Right Arm RADIOCEPHALIC ARTERIOVENOUS (AV) FISTULA ;  Surgeon: TRosetta Posner MD;  [place]  Service: Vascular;  Laterality: Right;  . BACK SURGERY     lumbar -   . CATARACT EXTRACTION W/PHACO  Left 07/02/2013   Procedure: CATARACT EXTRACTION PHACO AND INTRAOCULAR LENS PLACEMENT (IOC);  Surgeon: KTonny Branch MD;  [facility];  Service: Ophthalmology;  Laterality: Left;  CDE:  6.46  . CATARACT EXTRACTION W/PHACO Right 07/12/2013   Procedure: CATARACT EXTRACTION PHACO AND INTRAOCULAR LENS PLACEMENT (IOC);  Surgeon: KTonny Branch MD;  Location: [facility];  Service: Ophthalmology;  Laterality: Right;  CDE:12.48  . COLONOSCOPY N/A 12/11/2015   Procedure: COLONOSCOPY;  Surgeon: NRogene Houston MD;  [facility];  Service: Endoscopy;  Laterality: N/A;  1:55  . ESOPHAGOGASTRODUODENOSCOPY N/A 03/11/2016   Procedure: ESOPHAGOGASTRODUODENOSCOPY (EGD);  Surgeon: NRogene Houston MD;  [facility];  Service: Endoscopy;  Laterality: N/A;  1230 - Last pt of the day-Dr. RLaural Goldengoing to office to see pt's  . ESOPHAGOGASTRODUODENOSCOPY N/A 07/21/2017   Procedure: ESOPHAGOGASTRODUODENOSCOPY (EGD);  Surgeon: RRogene Houston MD;  [facility];  Service: Endoscopy;  Laterality: N/A;  3:00  . ESOPHAGOGASTRODUODENOSCOPY N/A 11/30/2018   Procedure: ESOPHAGOGASTRODUODENOSCOPY (EGD);  Surgeon: RRogene Houston MD;  [facility];  Service: Endoscopy;  Laterality: N/A;  9:55 - spoke with the facility and advised new arrival time 10:20  . ESOPHAGOGASTRODUODENOSCOPY N/A 01/11/2019   Procedure: ESOPHAGOGASTRODUODENOSCOPY (EGD);  Surgeon: RRogene Houston MD;  [facility];  Service: Endoscopy;  Laterality: N/A;  155 - office spoke with facility and they know to have pt here at 11:00  . FOOT SURGERY Right    x2-heel spur   . GIVENS CAPSULE STUDY N/A 11/09/2016   Procedure: GIVENS CAPSULE STUDY;  Surgeon: [name], MD;  [facility];  Service: Endoscopy;  Laterality: N/A;  7:30  . GIVENS CAPSULE STUDY N/A 11/10/2018   Procedure: GIVENS CAPSULE STUDY;  Surgeon: [name], MD;  [facility];  Service: Endoscopy;  Laterality: N/A;  . REVISON  OF ARTERIOVENOUS FISTULA Right 01/05/2016   Procedure: REVISON OF ARTERIOVENOUS FISTULA- right forearm;  Surgeon: [name], MD;  Location: [place];  Service: Vascular;  Laterality: Right;  . SKIN CANCER EXCISION     forehead  . TONSILLECTOMY      Family History  Problem Relation Age of Onset  . Multiple myeloma Mother   . Diabetes Mother   . Cirrhosis Mother   . Heart attack Father 6  . Colon cancer Neg Hx     Social History:  reports that she has been smoking cigarettes. She has a 10.00 pack-year smoking history. She has never used smokeless tobacco. She reports that she does not drink alcohol or use drugs.  Allergies:  Allergies  Allergen Reactions  . Penicillins Rash    Did it involve swelling of the face/tongue/throat, SOB, or low BP? Unknown Did it involve sudden or severe rash/hives, skin peeling, or any reaction on the inside of your mouth or nose? Unknown Did you need to seek medical attention at a hospital or doctor's office? Unknown When did it last happen?unknown If all above answers are "NO", may proceed with cephalosporin use.      Medications:  I have reviewed the patient's current medications. Prior to Admission:  Medications Prior to Admission  Medication Sig Dispense Refill Last Dose  . acetaminophen (TYLENOL) 500 MG tablet Take 1,000 mg every 6 (six) hours as needed by mouth (FOR HEADACHES/MINOR DISCOMFORT).   unknown  . ALPRAZolam (XANAX) 0.25 MG tablet Take 0.25 mg by mouth 2 (two) times daily.    02/24/2019 at Unknown time  . aluminum-magnesium hydroxide-simethicone (MAALOX) 223-361-22 MG/5ML SUSP Take 30 mLs by mouth 4 (four) times daily as needed (heartburn/indigestion).   unknown  . amLODipine (NORVASC) 5 MG tablet Take 5 mg by mouth at bedtime.    02/23/2019 at Unknown time  . anti-nausea (EMETROL) solution Take 30 mLs every 15 (fifteen) minutes as needed by mouth for nausea or vomiting (X4 doses only).   unknown  . b complex vitamins capsule  Take 1 capsule by mouth daily.    02/24/2019 at Unknown time  . buPROPion (WELLBUTRIN) 75 MG tablet Take 75 mg by mouth daily.    02/24/2019 at Unknown time  . carvedilol (COREG) 6.25 MG tablet Take 6.25 mg by mouth 2 (two) times a day.    02/24/2019 at 800  . fexofenadine (ALLEGRA) 180 MG tablet Take 180 mg by mouth daily.    02/24/2019 at Unknown time  . furosemide (LASIX) 20 MG tablet Take 20 mg by mouth at bedtime as needed (swelling).    unknown  . furosemide (LASIX) 40 MG tablet Take 40 mg by mouth daily.    02/24/2019 at Unknown time  . gabapentin (NEURONTIN) 300 MG capsule Take 300-600 mg by mouth See admin instructions. Take 300 mg in the morning and afternoon, take 600 mg at bedtime   02/24/2019 at Unknown time  . glipiZIDE (GLUCOTROL) 5  MG tablet Take 5 mg by mouth 2 (two) times a day.    02/24/2019 at Unknown time  . guaifenesin (ROBITUSSIN) 100 MG/5ML syrup Take 200 mg every 6 (six) hours as needed by mouth for cough.   unknown  . guaiFENesin-codeine (ROBITUSSIN AC) 100-10 MG/5ML syrup Take 5 mLs by mouth every 6 (six) hours as needed for cough.   unknown  . hydrALAZINE (APRESOLINE) 25 MG tablet Take 25 mg by mouth 3 (three) times daily. *HOLD MORNING DOSE ON DIALYSIS DAYS, MONDAYS, WEDNESDAYS, AND FRIDAYS   02/24/2019 at Unknown time  . insulin glargine (LANTUS) 100 unit/mL SOPN Inject 25-55 Units into the skin See admin instructions. 55 units in the morning and 25 units in the evening   02/24/2019 at 800  . insulin lispro (HUMALOG) 100 UNIT/ML KiwkPen Inject 10 Units into the skin 3 (three) times daily before meals. *Do Not to inject for levels below 100   02/24/2019 at Unknown time  . insulin regular (NOVOLIN R,HUMULIN R) 100 units/mL injection Inject 15 Units into the skin daily as needed for high blood sugar (for blood sugar levels over 400).   unknown  . loperamide (IMODIUM A-D) 2 MG tablet Take 2 mg by mouth as needed for diarrhea or loose stools (up to 8 doses in 24 hrs.).    unknown  .  Melatonin 5 MG CAPS Take 10 mg by mouth at bedtime.    02/23/2019 at Unknown time  . pantoprazole (PROTONIX) 40 MG tablet Take 1 tablet (40 mg total) by mouth 2 (two) times daily.   02/24/2019 at Unknown time  . polyvinyl alcohol (LIQUIFILM TEARS) 1.4 % ophthalmic solution Place 1 drop into both eyes every 4 (four) hours as needed for dry eyes.    unknown  . sertraline (ZOLOFT) 50 MG tablet Take 75 mg by mouth daily.    02/24/2019 at Unknown time  . sevelamer carbonate (RENVELA) 800 MG tablet Take 1,600-2,400 mg by mouth See admin instructions. 2400 mg 3 times daily with meals and 1600 mg twice daily with snacks   02/24/2019 at Unknown time   Results for orders placed or performed during the hospital encounter of 02/24/19 (from the past 48 hour(s))  Glucose, capillary     Status: Abnormal   Collection Time: 02/25/19 11:18 AM  Result Value Ref Range   Glucose-Capillary 131 (H) 70 - 99 mg/dL  CBC     Status: Abnormal   Collection Time: 02/25/19 11:41 AM  Result Value Ref Range   WBC 4.8 4.0 - 10.5 K/uL   RBC 2.41 (L) 3.87 - 5.11 MIL/uL   Hemoglobin 8.4 (L) 12.0 - 15.0 g/dL   HCT 26.1 (L) 36.0 - 46.0 %   MCV 108.3 (H) 80.0 - 100.0 fL   MCH 34.9 (H) 26.0 - 34.0 pg   MCHC 32.2 30.0 - 36.0 g/dL   RDW 19.2 (H) 11.5 - 15.5 %   Platelets 141 (L) 150 - 400 K/uL   nRBC 0.0 0.0 - 0.2 %    Comment: Performed at [facility], [address] [place], [place] [address]  Comprehensive metabolic panel     Status: Abnormal   Collection Time: 02/25/19 11:41 AM  Result Value Ref Range   Sodium 132 (L) 135 - 145 mmol/L   Potassium 4.0 3.5 - 5.1 mmol/L   Chloride 95 (L) 98 - 111 mmol/L   CO2 21 (L) 22 - 32 mmol/L   Glucose, Bld 130 (H) 70 - 99 mg/dL   BUN  49 (H) 8 - 23 mg/dL   Creatinine, Ser 7.38 (H) 0.44 - 1.00 mg/dL   Calcium 8.1 (L) 8.9 - 10.3 mg/dL   Total Protein 7.0 6.5 - 8.1 g/dL   Albumin 3.8 3.5 - 5.0 g/dL   AST 21 15 - 41 U/L   ALT 18 0 - 44 U/L   Alkaline Phosphatase 86 38 - 126 U/L   Total  Bilirubin 0.8 0.3 - 1.2 mg/dL   GFR calc non Af Amer 5 (L) >60 mL/min   GFR calc Af Amer 6 (L) >60 mL/min   Anion gap 16 (H) 5 - 15    Comment: Performed at [facility], [address], [place], [place] [address]  Vitamin B12     Status: None   Collection Time: 02/25/19 11:41 AM  Result Value Ref Range   Vitamin B-12 479 180 - 914 pg/mL    Comment: (NOTE) This assay is not validated for testing neonatal or myeloproliferative syndrome specimens for Vitamin B12 levels. Performed at [facility], [address], [place], [place] [address]   Reticulocytes     Status: Abnormal   Collection Time: 02/25/19 11:41 AM  Result Value Ref Range   Retic Ct Pct 4.7 (H) 0.4 - 3.1 %   RBC. 2.41 (L) 3.87 - 5.11 MIL/uL   Retic Count, Absolute 113.8 19.0 - 186.0 K/uL   Immature Retic Fract 18.2 (H) 2.3 - 15.9 %    Comment: Performed at [facility], [address] [place], [place] [address]  Iron and TIBC     Status: None   Collection Time: 02/25/19 11:41 AM  Result Value Ref Range   Iron 52 28 - 170 ug/dL   TIBC 300 250 - 450 ug/dL   Saturation Ratios 17 10.4 - 31.8 %   UIBC 248 ug/dL    Comment: Performed at [facility], [address], [place], [place] [address]  Ferritin     Status: Abnormal   Collection Time: 02/25/19 11:41 AM  Result Value Ref Range   Ferritin 316 (H) 11 - 307 ng/mL    Comment: Performed at [facility], [address], [place], [place] [address]  Glucose, capillary     Status: Abnormal   Collection Time: 02/25/19  4:12 PM  Result Value Ref Range   Glucose-Capillary 113 (H) 70 - 99 mg/dL  Glucose, capillary     Status: None   Collection Time: 02/25/19  9:43 PM  Result Value Ref Range   Glucose-Capillary 77 70 - 99 mg/dL  CBC     Status: Abnormal   Collection Time: 02/26/19  4:58 AM  Result Value Ref Range   WBC 4.6 4.0 - 10.5 K/uL   RBC 2.12 (L) 3.87 - 5.11 MIL/uL   Hemoglobin 7.2 (L) 12.0 - 15.0 g/dL   HCT 23.1 (L) 36.0 - 46.0 %   MCV 109.0 (H) 80.0 - 100.0 fL    MCH 34.0 26.0 - 34.0 pg   MCHC 31.2 30.0 - 36.0 g/dL   RDW 18.5 (H) 11.5 - 15.5 %   Platelets 128 (L) 150 - 400 K/uL   nRBC 0.0 0.0 - 0.2 %    Comment: Performed at [facility], [address], [place],  [address]  Magnesium     Status: None   Collection Time: 02/26/19  4:58 AM  Result Value Ref Range   Magnesium 2.2 1.7 - 2.4 mg/dL    Comment: Performed at [facility], [address] [place],  [address]  Renal function panel     Status: Abnormal   Collection Time: 02/26/19  4:58 AM  Result Value Ref Range   Sodium 135 135 - 145 mmol/L   Potassium 3.9 3.5 - 5.1 mmol/L   Chloride 94 (L) 98 - 111 mmol/L   CO2 25 22 - 32 mmol/L   Glucose, Bld 64 (L) 70 - 99 mg/dL   BUN 61 (H) 8 - 23 mg/dL   Creatinine, Ser 8.35 (H) 0.44 - 1.00 mg/dL   Calcium 8.3 (L) 8.9 - 10.3 mg/dL   Phosphorus 6.8 (H) 2.5 - 4.6 mg/dL   Albumin 3.6 3.5 - 5.0 g/dL   GFR calc non Af Amer 4 (L) >60 mL/min   GFR calc Af Amer 5 (L) >60 mL/min   Anion gap 16 (H) 5 - 15    Comment: Performed at [facility], [address] [place], [place] [address]  Glucose, capillary     Status: None   Collection Time: 02/26/19  7:21 AM  Result Value Ref Range   Glucose-Capillary 97 70 - 99 mg/dL   Comment 1 Notify RN    Comment 2 Document in Chart   Glucose, capillary     Status: None   Collection Time: 02/26/19 11:35 AM  Result Value Ref Range   Glucose-Capillary 97 70 - 99 mg/dL   Comment 1 Notify RN    Comment 2 Document in Chart   Glucose, capillary     Status: Abnormal   Collection Time: 02/26/19  4:34 PM  Result Value Ref Range   Glucose-Capillary 148 (H) 70 - 99 mg/dL   Comment 1 Notify RN    Comment 2 Document in Chart   Glucose, capillary     Status: None   Collection Time: 02/26/19  9:05 PM  Result Value Ref Range   Glucose-Capillary 74 70 - 99 mg/dL  CBC     Status: Abnormal   Collection Time: 02/27/19  6:25 AM  Result Value Ref Range   WBC 3.9 (L) 4.0 - 10.5 K/uL   RBC 2.03 (L) 3.87 - 5.11  MIL/uL   Hemoglobin 7.1 (L) 12.0 - 15.0 g/dL   HCT 21.8 (L) 36.0 - 46.0 %   MCV 107.4 (H) 80.0 - 100.0 fL   MCH 35.0 (H) 26.0 - 34.0 pg   MCHC 32.6 30.0 - 36.0 g/dL   RDW 17.6 (H) 11.5 - 15.5 %   Platelets 129 (L) 150 - 400 K/uL    Comment: Immature Platelet Fraction may be clinically indicated, consider ordering this additional test [id number]    nRBC 0.0 0.0 - 0.2 %    Comment: Performed at [facility], [address], [place], [place] [address]  Renal function panel     Status: Abnormal   Collection Time: 02/27/19  6:25 AM  Result Value Ref Range   Sodium 134 (L) 135 - 145 mmol/L   Potassium 4.3 3.5 - 5.1 mmol/L   Chloride 97 (L) 98 - 111 mmol/L   CO2 23 22 - 32 mmol/L   Glucose, Bld 76 70 - 99 mg/dL   BUN 75 (H) 8 - 23 mg/dL   Creatinine, Ser 9.34 (H) 0.44 - 1.00 mg/dL   Calcium 8.3 (L) 8.9 - 10.3 mg/dL   Phosphorus 6.8 (H) 2.5 - 4.6 mg/dL   Albumin 3.5 3.5 - 5.0 g/dL   GFR calc non Af Amer 4 (L) >60 mL/min   GFR calc Af Amer 4 (L) >60 mL/min   Anion gap 14  5 - 15    Comment: Performed at [facility], [address] [place], [place] [address]    No results found.  ROS Blood pressure 134/63, pulse 71, temperature 98.1 F (36.7 C), temperature source Oral, resp. rate 18, height _0  (1.575 m), weight 108 kg, SpO2 96 %. Physical Exam Physical Examination: General appearance - alert, well appearing, and in no distress, oriented to person, place, and time and slowed mentation Mental status - alert, oriented to person, place, and time, depressed mood Eyes - L ptosis, DM retinopathy Mouth - dental hygiene poor Neck - adenopathy noted PCL Lymphatics - posterior cervical nodes Chest - decreased BS,  Heart - S1 and S2 normal, systolic murmur LL9/7 at 2nd left intercostal space Abdomen - obese, pos bs, soft,nontender Extremities - pedal edema 2 +, decreased DP, AVF R FA Skin - dry , bruises, telanjectasia, trophic changes distally  Assessment/Plan: 1 GIB ongoing issue 2  ESRD: vol xs needs HD, lower dry 3 Hypertension: lower meds, lower vol 4. Anemia of ESRD: will give ESA, start Fe 5. Metabolic Bone Disease: needs ^ binder 6 DM controlled 7 Obesity 8 Smokng 9 DJD 10 DDD 11 Gout P HD, lower vol, stop some of bp meds, add Fe ,esa,   Mauricia Area 02/27/2019, 7:36 AM

## 2019-02-27 NOTE — Progress Notes (Addendum)
PT HAS CHRONIC ANEMIA-NO BRBPR OR MELENA. NO NEED TO HEME TEST STOOLS. AGREE WITH ARANESP/IVFE AND HEMATOLOGY CONSULT TO ASSIST WITH CO-MANAGING ANEMIA. HAD AN EXTENSIVE WORKUP WITHIN THE LAST  1-3 YEARS. NO INDICATION FOR ENDOSCOPY AT THIS TIME.   Subjective: No overt GI bleeding. Had BM this morning. Dark brown. No melena or bright red blood. Awaiting hemoccult. For dialysis today.   Objective: Vital signs in last 24 hours: Temp:  [97.4 F (36.3 C)-98.1 F (36.7 C)] 98.1 F (36.7 C) (06/23 0511) Pulse Rate:  [71-74] 71 (06/23 0511) Resp:  [16-18] 18 (06/23 0511) BP: (124-146)/(57-63) 134/63 (06/23 0511) SpO2:  [92 %-98 %] 96 % (06/23 0511) Weight:  [161 kg] 108 kg (06/23 0500) Last BM Date: 02/25/19 General:   Alert and oriented, pleasant Abdomen:  Bowel sounds present, obese, soft, non-tender,  Extremities:  With pedal edema 2+ Neurologic:  Alert and  oriented x4 Psych:  Alert and cooperative. Normal mood and affect.  Intake/Output from previous day: 06/22 0701 - 06/23 0700 In: 960 [P.O.:960] Out: -  Intake/Output this shift: No intake/output data recorded.  Lab Results: Recent Labs    02/25/19 1141 02/26/19 0458 02/27/19 0625  WBC 4.8 4.6 3.9*  HGB 8.4* 7.2* 7.1*  HCT 26.1* 23.1* 21.8*  PLT 141* 128* 129*   BMET Recent Labs    02/25/19 1141 02/26/19 0458 02/27/19 0625  NA 132* 135 134*  K 4.0 3.9 4.3  CL 95* 94* 97*  CO2 21* 25 23  GLUCOSE 130* 64* 76  BUN 49* 61* 75*  CREATININE 7.38* 8.35* 9.34*  CALCIUM 8.1* 8.3* 8.3*   LFT Recent Labs    02/24/19 2100 02/25/19 1141 02/26/19 0458 02/27/19 0625  PROT 6.9 7.0  --   --   ALBUMIN 3.8 3.8 3.6 3.5  AST 19 21  --   --   ALT 18 18  --   --   ALKPHOS 87 86  --   --   BILITOT 0.5 0.8  --   --     Assessment: 68 year old female with history of chronic anemia in setting of IDA, CKD, GAVE presenting with acute on chronic anemia requiring blood transfusion. Hgb stable for past 24 hours. I personally  saw bowel movement and no melena or hematochezia noted. Awaiting hemoccult. Historically, Hgb trends in the 7-8 range. Anemia multifactorial in this setting. 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 If evidence for worsening anemia, melena, would recommend repeat EGD. For dialysis today and no heparin during dialysis is noted.   Plan: Dialysis sessions without heparin PPI BID Follow H/H Awaiting hemoccult 2 gram sodium diet today Ferric gluconate every M, W, Fri  If evidence for melena and/or worsening anemia, repeat EGD  Would benefit from outpatient Hematology consultation No prior abdominal imaging: non-urgent ultrasound to assess for underlying cirrhosis  Outpatient follow-up with Dr. Laural Golden to discuss early interval colonoscopy as last was 3 years ago with multiple polyps   Annitta Needs, PhD, ANP-BC [facility]     LOS: 1 day    02/27/2019, 9:15 AM

## 2019-02-27 NOTE — Evaluation (Signed)
Physical Therapy Evaluation Patient Details Name: Tanya Lucero MRN: [id number] DOB: 10/11/1950 Today's Date: 02/27/2019   History of Present Illness  Tanya Lucero  is a 68 y.o. female,w hypertension, Dm2, Diabetic neuropathy, ESRD on HD (M,W, F) at [facility], Diverticulosis on Colonoscopy 12/11/2015, Iron deficiency anemia,  recently admitted for anemia w EGD on 5/72020=> angiodysplasia of the stomach=>argon laser, who presents with low Hgb, told by her nephrologist per ED physician to go to ER for transfusion.  + dyspnea with exertion.  Denies fever, chills, cough, cp, palp, n/v, abd pain, diarrhea, brbpr.    Clinical Impression  Patient functioning near baseline for functional mobility and gait, other than mostly limited due to c/o fatigue and mild SOB, no loss of balance and demonstrates good return for transferring to commode in bathroom without assist.  Plan:  Patient discharged from physical therapy to care of nursing for ambulation daily as tolerated for length of stay.     Follow Up Recommendations Home health PT    Equipment Recommendations  None recommended by PT    Recommendations for Other Services       Precautions / Restrictions Precautions Precautions: None Restrictions Weight Bearing Restrictions: No      Mobility  Bed Mobility Overal bed mobility: Modified Independent             General bed mobility comments: increased time  Transfers Overall transfer level: Modified independent               General transfer comment: increased time  Ambulation/Gait Ambulation/Gait assistance: Modified independent (Device/Increase time) Gait Distance (Feet): 75 Feet Assistive device: 4-wheeled walker Gait Pattern/deviations: WFL(Within Functional Limits) Gait velocity: normal   General Gait Details: good return for ambulation in room and hallways without loss of balance using Rollator  Stairs            Wheelchair Mobility    Modified  Rankin (Stroke Patients Only)       Balance Overall balance assessment: Mild deficits observed, not formally tested                                           Pertinent Vitals/Pain Pain Assessment: No/denies pain    Home Living Family/patient expects to be discharged to:: Assisted living               Home Equipment: Walker - 4 wheels;Shower seat      Prior Function Level of Independence: Needs assistance   Gait / Transfers Assistance Needed: household ambulator with Rollator  ADL's / Homemaking Assistance Needed: assisted with bathing and dressing by ALF staff        Hand Dominance        Extremity/Trunk Assessment   Upper Extremity Assessment Upper Extremity Assessment: Overall WFL for tasks assessed    Lower Extremity Assessment Lower Extremity Assessment: Overall WFL for tasks assessed    Cervical / Trunk Assessment Cervical / Trunk Assessment: Normal  Communication   Communication: No difficulties  Cognition Arousal/Alertness: Awake/alert Behavior During Therapy: WFL for tasks assessed/performed Overall Cognitive Status: Within Functional Limits for tasks assessed                                        General Comments      Exercises  Assessment/Plan    PT Assessment All further PT needs can be met in the next venue of care  PT Problem List Decreased strength;Decreased activity tolerance;Decreased mobility       PT Treatment Interventions      PT Goals (Current goals can be found in the Care Plan section)  Acute Rehab PT Goals Patient Stated Goal: return home with assistance PT Goal Formulation: With patient Time For Goal Achievement: 02/28/19 Potential to Achieve Goals: Good    Frequency     Barriers to discharge        Co-evaluation               AM-PAC PT "6 Clicks" Mobility  Outcome Measure Help needed turning from your back to your side while in a flat bed without using  bedrails?: None Help needed moving from lying on your back to sitting on the side of a flat bed without using bedrails?: None Help needed moving to and from a bed to a chair (including a wheelchair)?: None Help needed standing up from a chair using your arms (e.g., wheelchair or bedside chair)?: None Help needed to walk in hospital room?: None Help needed climbing 3-5 steps with a railing? : A Little 6 Click Score: 23    End of Session   Activity Tolerance: Patient tolerated treatment well;Patient limited by fatigue Patient left: in bed;with call bell/phone within reach Nurse Communication: Mobility status PT Visit Diagnosis: Unsteadiness on feet (R26.81);Other abnormalities of gait and mobility (R26.89);Muscle weakness (generalized) (M62.81)    Time: 4255-2589 PT Time Calculation (min) (ACUTE ONLY): 15 min   Charges:   PT Evaluation $PT Eval Low Complexity: 1 Low PT Treatments $Therapeutic Activity: 8-22 mins        3:37 PM, 02/27/19 Lonell Grandchild, MPT Physical Therapist with [facility] [phone] office [phone] mobile phone

## 2019-02-27 NOTE — Clinical Social Work Note (Addendum)
Pt was originally scheduled for d/c today, but was not able to get 4 hour dialysis until 5PM, which changed her d/c date to tomorrow.  Call [facility] in AM to schedule pick up for transportation home.  Although Dr put in [facility] order, [facility] states they are not allowing HHPT on premises at this point. Hospital follow up appointment with PCP has been scheduled.

## 2019-02-28 ENCOUNTER — Telehealth: Payer: Self-pay | Admitting: Gastroenterology

## 2019-02-28 LAB — COMPREHENSIVE METABOLIC PANEL WITH GFR
ALT: 15 U/L (ref 0–44)
AST: 18 U/L (ref 15–41)
Albumin: 3.5 g/dL (ref 3.5–5.0)
Alkaline Phosphatase: 72 U/L (ref 38–126)
Anion gap: 12 (ref 5–15)
BUN: 29 mg/dL — ABNORMAL HIGH (ref 8–23)
CO2: 30 mmol/L (ref 22–32)
Calcium: 8.2 mg/dL — ABNORMAL LOW (ref 8.9–10.3)
Chloride: 94 mmol/L — ABNORMAL LOW (ref 98–111)
Creatinine, Ser: 5.17 mg/dL — ABNORMAL HIGH (ref 0.44–1.00)
GFR calc Af Amer: 9 mL/min — ABNORMAL LOW (ref >60)
GFR calc non Af Amer: 8 mL/min — ABNORMAL LOW (ref >60)
Glucose, Bld: 81 mg/dL (ref 70–99)
Potassium: 3.8 mmol/L (ref 3.5–5.1)
Sodium: 136 mmol/L (ref 135–145)
Total Bilirubin: 0.5 mg/dL (ref 0.3–1.2)
Total Protein: 6.5 g/dL (ref 6.5–8.1)

## 2019-02-28 LAB — RENAL FUNCTION PANEL
Albumin: 3.5 g/dL (ref 3.5–5.0)
Anion gap: 16 — ABNORMAL HIGH (ref 5–15)
BUN: 30 mg/dL — ABNORMAL HIGH (ref 8–23)
CO2: 27 mmol/L (ref 22–32)
Calcium: 8.1 mg/dL — ABNORMAL LOW (ref 8.9–10.3)
Chloride: 93 mmol/L — ABNORMAL LOW (ref 98–111)
Creatinine, Ser: 5.2 mg/dL — ABNORMAL HIGH (ref 0.44–1.00)
GFR calc Af Amer: 9 mL/min — ABNORMAL LOW (ref >60)
GFR calc non Af Amer: 8 mL/min — ABNORMAL LOW (ref >60)
Glucose, Bld: 80 mg/dL (ref 70–99)
Phosphorus: 4.4 mg/dL (ref 2.5–4.6)
Potassium: 3.9 mmol/L (ref 3.5–5.1)
Sodium: 136 mmol/L (ref 135–145)

## 2019-02-28 LAB — CBC WITH DIFFERENTIAL/PLATELET
Abs Immature Granulocytes: 0 K/uL (ref 0.00–0.07)
Basophils Absolute: 0 K/uL (ref 0.0–0.1)
Basophils Relative: 0 %
Eosinophils Absolute: 0.1 K/uL (ref 0.0–0.5)
Eosinophils Relative: 1 %
HCT: 22.4 % — ABNORMAL LOW (ref 36.0–46.0)
Hemoglobin: 7.2 g/dL — ABNORMAL LOW (ref 12.0–15.0)
Immature Granulocytes: 0 %
Lymphocytes Relative: 21 %
Lymphs Abs: 0.8 K/uL (ref 0.7–4.0)
MCH: 34.6 pg — ABNORMAL HIGH (ref 26.0–34.0)
MCHC: 32.1 g/dL (ref 30.0–36.0)
MCV: 107.7 fL — ABNORMAL HIGH (ref 80.0–100.0)
Monocytes Absolute: 0.3 K/uL (ref 0.1–1.0)
Monocytes Relative: 8 %
Neutro Abs: 2.8 K/uL (ref 1.7–7.7)
Neutrophils Relative %: 70 %
Platelets: 80 K/uL — ABNORMAL LOW (ref 150–400)
RBC: 2.08 MIL/uL — ABNORMAL LOW (ref 3.87–5.11)
RDW: 17.3 % — ABNORMAL HIGH (ref 11.5–15.5)
WBC: 4 K/uL (ref 4.0–10.5)
nRBC: 0 % (ref 0.0–0.2)

## 2019-02-28 LAB — TYPE AND SCREEN
ABO/RH(D): O POS
Antibody Screen: NEGATIVE
Unit division: 0
Unit division: 0

## 2019-02-28 LAB — MAGNESIUM: Magnesium: 2.1 mg/dL (ref 1.7–2.4)

## 2019-02-28 LAB — BPAM RBC
Blood Product Expiration Date: 202007202359
Blood Product Expiration Date: 202007242359
ISSUE DATE / TIME: 202006210634
Unit Type and Rh: 5100
Unit Type and Rh: 5100

## 2019-02-28 LAB — GLUCOSE, CAPILLARY
Glucose-Capillary: 78 mg/dL (ref 70–99)
Glucose-Capillary: 133 mg/dL — ABNORMAL HIGH (ref 70–99)

## 2019-02-28 LAB — HEPATITIS B SURFACE ANTIGEN: Hepatitis B Surface Ag: NEGATIVE

## 2019-02-28 MED ORDER — AMLODIPINE BESYLATE 5 MG PO TABS: 5.0000 mg | ORAL_TABLET | Freq: Every day | ORAL | 3 refills | Status: AC

## 2019-02-28 MED ORDER — FUROSEMIDE 20 MG PO TABS: 20.0000 mg | ORAL_TABLET | Freq: Every evening | ORAL | 3 refills | Status: DC | PRN

## 2019-02-28 MED ORDER — GLIPIZIDE 5 MG PO TABS: 5.0000 mg | ORAL_TABLET | Freq: Two times a day (BID) | ORAL | 3 refills | Status: DC

## 2019-02-28 MED ORDER — CARVEDILOL 6.25 MG PO TABS: 6.2500 mg | ORAL_TABLET | Freq: Two times a day (BID) | ORAL | 4 refills | Status: AC

## 2019-02-28 NOTE — TOC Transition Note (Signed)
Transition of Care [facility]) - CM/SW Discharge Note   Patient Details  Name: Tanya Lucero MRN: [id number] Date of Birth: 1951-05-22  [facility]) CM/SW Contact:  Boneta Lucks, RN Phone Number: 02/28/2019, 11:43 AM   Clinical Narrative:   Patient is discharge today.  Patient get dialysis MWF, Per MD patient will need Dialysis Thursday for one session.  CM called [facility] in [place] RN will work her in at 12:00pm .  [facility]F to making them aware of discharge and need appointment for dialysis tomorrow.  Added appointment to AVS, Gordan Payment RN to reprint and call report, and advise [facility] with a pick up time.     Final next level of care: [place]     Patient Goals and CMS Choice Patient states their goals for this hospitalization and ongoing recovery are:: to go back to ALF.

## 2019-02-28 NOTE — Telephone Encounter (Signed)
Inpatient with acute on chronic anemia. No endoscopic procedures during admission. Needs close follow-up in office next 1-2 weeks.

## 2019-02-28 NOTE — Discharge Instructions (Signed)
1)Very low-salt diet advised 2)Weigh yourself daily, call if you gain more than 3 pounds in 1 day or more than 5 pounds in 1 week as your diuretic medications may need to be adjusted 3)Limit your Fluid  intake to no more than 60 ounces (1.8 Liters) per day 4) you have an appointment at your dialysis center for hemodialysis on Thursday, 03/01/2019 at 12 noon--- please keep this appointment at [facility] outpatient hemodialysis center in [place]  It is important that you read the given instructions as well as go over your medication list with RN to help you understand your care after this hospitalization.  Discharge Instructions: Please follow-up with PCP in 1-2 weeks

## 2019-02-28 NOTE — Telephone Encounter (Signed)
Forwarded to [employer] to sch OV

## 2019-02-28 NOTE — Discharge Summary (Signed)
Tanya Lucero, is a 68 y.o. female  DOB 21-Jun-1951  MRN [id number].  Admission date:  02/24/2019  Admitting Physician  Jani Gravel, MD  Discharge Date:  02/28/2019   Primary MD  [name], DO  Recommendations for primary care physician for things to follow:  1)Very low-salt diet advised 2)Weigh yourself daily, call if you gain more than 3 pounds in 1 day or more than 5 pounds in 1 week as your diuretic medications may need to be adjusted 3)Limit your Fluid  intake to no more than 60 ounces (1.8 Liters) per day 4) you have an appointment at your dialysis center for hemodialysis on Thursday, 03/01/2019 at 12 noon--- please keep this appointment at [facility] outpatient hemodialysis center in [place]  It is important that you read the given instructions as well as go over your medication list with RN to help you understand your care after this hospitalization.  Discharge Instructions: Please follow-up with PCP in 1-2 weeks  Admission Diagnosis  needs blood transfusion   Discharge Diagnosis  needs blood transfusion    Principal Problem:   Symptomatic anemia Active Problems:   Type 2 diabetes with nephropathy (HCC)   Iron deficiency anemia due to chronic blood loss   ESRD (end stage renal disease) ([place])   Diabetic neuropathy (HCC)      Past Medical History:  Diagnosis Date   Anemia    Anxiety    Arthritis    Chronic kidney disease    neuphrotic syndrome-05/2013   Constipation    Decreased vision    left eye   Depression    Diabetes mellitus    Type 2   Diabetic neuropathy (HCC)    Diverticulitis    Dry skin    GERD (gastroesophageal reflux disease)    GI bleed 07/05/2017   Gout    Headache    Hypertension    Neuropathy    Shortness of breath dyspnea    Skin cancer     Past Surgical History:  Procedure Laterality Date   ABDOMINAL HYSTERECTOMY     ADENOIDECTOMY      APPENDECTOMY     taken out with hysterectomy   AV FISTULA PLACEMENT Right 09/13/2014   Procedure: Creation of Right Arm RADIOCEPHALIC ARTERIOVENOUS (AV) FISTULA ;  Surgeon: [name], MD;  Location: [facility];  Service: Vascular;  Laterality: Right;   BACK SURGERY     lumbar -    CATARACT EXTRACTION W/PHACO Left 07/02/2013   Procedure: CATARACT EXTRACTION PHACO AND INTRAOCULAR LENS PLACEMENT (IOC);  Surgeon: [name], MD;  Location: [facility];  Service: Ophthalmology;  Laterality: Left;  CDE:  6.46   CATARACT EXTRACTION W/PHACO Right 07/12/2013   Procedure: CATARACT EXTRACTION PHACO AND INTRAOCULAR LENS PLACEMENT (IOC);  Surgeon: [name], MD;  Location: [facility];  Service: Ophthalmology;  Laterality: Right;  CDE:12.48   COLONOSCOPY N/A 12/11/2015   Procedure: COLONOSCOPY;  Surgeon: [name], MD;  [facility];  Service: Endoscopy;  Laterality: N/A;  1:55  ESOPHAGOGASTRODUODENOSCOPY N/A 03/11/2016   Procedure: ESOPHAGOGASTRODUODENOSCOPY (EGD);  Surgeon: [name], MD;  [facility];  Service: Endoscopy;  Laterality: N/A;  1230 - Last pt of the day-Dr. Laural [place] going to office to see pt's   ESOPHAGOGASTRODUODENOSCOPY N/A 07/21/2017   Procedure: ESOPHAGOGASTRODUODENOSCOPY (EGD);  Surgeon: [name], MD;  [facility];  Service: Endoscopy;  Laterality: N/A;  3:00   ESOPHAGOGASTRODUODENOSCOPY N/A 11/30/2018   Procedure: ESOPHAGOGASTRODUODENOSCOPY (EGD);  Surgeon: [name], MD;  [facility];  Service: Endoscopy;  Laterality: N/A;  9:55 - spoke with the facility and advised new arrival time 10:20   ESOPHAGOGASTRODUODENOSCOPY N/A 01/11/2019   Procedure: ESOPHAGOGASTRODUODENOSCOPY (EGD);  Surgeon: [name], MD;  [facility];  Service: Endoscopy;  Laterality: N/A;  155 - office spoke with facility and they know to have pt here at 11:00   FOOT SURGERY Right    x2-heel spur    GIVENS CAPSULE STUDY N/A 11/09/2016     Procedure: [place];  Surgeon: [name], MD;  [facility];  Service: Endoscopy;  Laterality: N/A;  7:30   GIVENS CAPSULE STUDY N/A 11/10/2018   Procedure: GIVENS CAPSULE STUDY;  Surgeon: [name], MD;  [facility];  Service: Endoscopy;  Laterality: N/A;   REVISON OF ARTERIOVENOUS FISTULA Right 01/05/2016   Procedure: REVISON OF ARTERIOVENOUS FISTULA- right forearm;  Surgeon: [name], MD;  Location: [facility];  Service: Vascular;  Laterality: Right;   SKIN CANCER EXCISION     forehead   TONSILLECTOMY         HPI  from the history and physical done on the day of admission:    Tanya Lucero  is a 68 y.o. female,w hypertension, Dm2, Diabetic neuropathy, ESRD on HD (M,W, F) at [facility], Diverticulosis on Colonoscopy 12/11/2015, Iron deficiency anemia,  recently admitted for anemia w EGD on 5/72020=> angiodysplasia of the stomach=>argon laser, who presents with low Hgb, told by her nephrologist per ED physician to go to ER for transfusion.  + dyspnea with exertion.  Denies fever, chills, cough, cp, palp, n/v, abd pain, diarrhea, brbpr.    In ED T 99.2, P 81  R 16, Bp 171/67  Pox 95%   Wt 99.8 kg  Wbc 5.4, hgb 6.9 Plt 139 Na 138, K 3.6,  Bun 41, Creatinine 6.5 Calcium 8.5  Pt started on 2 units prbc in ED.   Pt will be admitted for symptomatic anemia.   Hospital Course:   Brief hospital course: w hypertension, Dm2, Diabetic neuropathy, ESRD on HD (M,W, F) at [facility], [place] on Colonoscopy 12/11/2015, Iron deficiency anemia,recently admitted for anemia w EGD on 01/11/2019=>angiodysplasia of the stomach=>argon laser, who presents with low Hgb, told by her nephrologist per ED physician to go to ER for transfusion.+ dyspnea with exertion. Denies fever, chills, cough, cp, palp, n/v, abd pain, diarrhea, brbpr.    Subjective:  No further emesis, respiratory status has improved, no significant dyspnea at this time  after transfusion   Assessment and Plan: Acute on chronic symptomatic anemia/Iron deficiency anemia Patient with history of gave with ongoing GI blood loss superimposed on anemia of CKD ---transfuse status post transfusion of  PRBC. Hemoglobin remained stable above 7  no active bleeding here. Patient does not report any evidence of bleeding at home as well. Recent endoscopy was done last month.   After initial drop hemoglobin remained stable.  GI was consulted.  Appreciate assistance.  Conservative management recommended.  No endoscopy here in the hospital.  EPO per nephrologist   Dm2 uncontrolled with hyperglycemia and hypoglycemia now.  Renal complication.  Neuropathy complication. --- Resume home regimen  Diabetic neuropathy Cont Gabapentin  Hypertension Cont Carvedilol 6.25mg  po bid Cont Amlodipine 5mg  po qhs Cont Lasix  H/o GAVE Cont PPI--- transfuse as indicated, GI input appreciated  Anxiety and Depression Cont Zoloft 75mg  po qday Cont Wellbutrin 75mg  po qday Cont Xanax  ESRD on HD (M, W, F) Cont Sevalemer Patient follow up at regular dialysis at [facility] in [place] Nephrology consult appreciated  Acute on chronic diastolic CHF. Volume overload secondary to blood transfusion Treated with IV Lasix, much improved after hemodialysis -continue p.o. Lasix at home   Diet: Renal diet  Advance goals of care discussion: Full code  Family Communication:   Discussed with sister  .  Disposition:  Discharge to ALF on 02/28/2019.Marland Kitchen  Consultants: Nephrology, GI Procedures: PRBC transfusion   Discharge Condition: stable  Follow UP   Follow-up Information    [facility] Paganini Follow up on 03/13/2019.   Why: Tuesday at 10:00 with the Dr for your hospital follow up appointment Contact information: [address] [place], [place] [address] [place]       Dialysis [facility] in [place] Follow up.   Why:  Thursday at 12:00 noon          Diet and Activity recommendation:  As advised  Discharge Instructions    Discharge Instructions    Call MD for:  difficulty breathing, headache or visual disturbances   Complete by: As directed    Call MD for:  persistant dizziness or light-headedness   Complete by: As directed    Call MD for:  persistant nausea and vomiting   Complete by: As directed    Call MD for:  severe uncontrolled pain   Complete by: As directed    Call MD for:  temperature >100.4   Complete by: As directed    Diet - low sodium heart healthy   Complete by: As directed    Diet Carb Modified   Complete by: As directed    Diet renal 60/70-10-08-1198   Complete by: As directed    Discharge instructions   Complete by: As directed    1)Very low-salt diet advised 2)Weigh yourself daily, call if you gain more than 3 pounds in 1 day or more than 5 pounds in 1 week as your diuretic medications may need to be adjusted 3)Limit your Fluid  intake to no more than 60 ounces (1.8 Liters) per day 4) you have an appointment at your dialysis center for hemodialysis on Thursday, 03/01/2019 at 12 noon--- please keep this appointment at [facility] outpatient hemodialysis center in [place]  It is important that you read the given instructions as well as go over your medication list with RN to help you understand your care after this hospitalization.  Discharge Instructions: Please follow-up with PCP in 1-2 weeks  Please request your primary care physician to go over all Hospital Tests and Procedure/Radiological results at the follow up. Please get all Hospital records sent to your PCP by signing hospital release before you go home.   Do not take more than prescribed Pain, Sleep and Anxiety Medications. You were cared for by a hospitalist during your hospital stay. If you have any questions about your discharge medications or the care you received while you were in the hospital after you are discharged,  you can call the unit @UNIT @ you  were admitted to and ask to speak with the hospitalist on call if the hospitalist that took care of you is not available.  Once you are discharged, your primary care physician will handle any further medical issues. Please note that NO REFILLS for any discharge medications will be authorized once you are discharged, as it is imperative that you return to your primary care physician (or establish a relationship with a primary care physician if you do not have one) for your aftercare needs so that they can reassess your need for medications and monitor your lab values. You Must read complete instructions/literature along with all the possible adverse reactions/side effects for all the Medicines you take and that have been prescribed to you. Take any new Medicines after you have completely understood and accept all the possible adverse reactions/side effects. Wear Seat belts while driving. If you have smoked or chewed Tobacco in the last 2 yrs please stop smoking and/or stop any Recreational drug use.  If you drink alcohol, please moderate the use and do not drive, operating heavy machinery, perform activities at heights, swimming or participation in water activities or provide baby sitting services under influence.   Discharge instructions   Complete by: As directed    1)Very low-salt diet advised 2)Weigh yourself daily, call if you gain more than 3 pounds in 1 day or more than 5 pounds in 1 week as your diuretic medications may need to be adjusted 3)Limit your Fluid  intake to no more than 60 ounces (1.8 Liters) per day 4) you have an appointment at your dialysis center for hemodialysis on Thursday, 03/01/2019 at 12 noon--- please keep this appointment at [facility] outpatient hemodialysis center in [place]  It is important that you read the given instructions as well as go over your medication list with RN to help you understand your care after this hospitalization.  Discharge Instructions: Please follow-up  with PCP in 1-2 weeks   Increase activity slowly   Complete by: As directed    Increase activity slowly   Complete by: As directed        Discharge Medications     Allergies as of 02/28/2019      Reactions   Penicillins Rash   Did it involve swelling of the face/tongue/throat, SOB, or low BP? Unknown Did it involve sudden or severe rash/hives, skin peeling, or any reaction on the inside of your mouth or nose? Unknown Did you need to seek medical attention at a hospital or doctor's office? Unknown When did it last happen?unknown If all above answers are NO, may proceed with cephalosporin use.      Medication List    STOP taking these medications   hydrALAZINE 25 MG tablet Commonly known as: APRESOLINE   insulin regular 100 units/mL injection Commonly known as: NOVOLIN R     TAKE these medications   acetaminophen 500 MG tablet Commonly known as: TYLENOL Take 1,000 mg every 6 (six) hours as needed by mouth (FOR HEADACHES/MINOR DISCOMFORT).   ALPRAZolam 0.25 MG tablet Commonly known as: XANAX Take 0.25 mg by mouth 2 (two) times daily.   aluminum-magnesium hydroxide-simethicone 850-277-41 MG/5ML Susp Commonly known as: MAALOX Take 30 mLs by mouth 4 (four) times daily as needed (heartburn/indigestion).   amLODipine 5 MG tablet Commonly known as: NORVASC Take 1 tablet (5 mg total) by mouth at bedtime.   anti-nausea solution Take 30 mLs every 15 (fifteen) minutes as needed by mouth for  nausea or vomiting (X4 doses only).   b complex vitamins capsule Take 1 capsule by mouth daily.   buPROPion 75 MG tablet Commonly known as: WELLBUTRIN Take 75 mg by mouth daily.   carvedilol 6.25 MG tablet Commonly known as: COREG Take 1 tablet (6.25 mg total) by mouth 2 (two) times a day.   fexofenadine 180 MG tablet Commonly known as: ALLEGRA Take 180 mg by mouth daily.   furosemide 40 MG tablet Commonly known as: LASIX Take 40 mg by mouth daily. What changed:  Another medication with the same name was changed. Make sure you understand how and when to take each.   furosemide 20 MG tablet Commonly known as: LASIX Take 1 tablet (20 mg total) by mouth at bedtime as needed for fluid or edema (swelling). What changed: reasons to take this   gabapentin 300 MG capsule Commonly known as: NEURONTIN Take 300-600 mg by mouth See admin instructions. Take 300 mg in the morning and afternoon, take 600 mg at bedtime   glipiZIDE 5 MG tablet Commonly known as: GLUCOTROL Take 1 tablet (5 mg total) by mouth 2 (two) times a day.   guaifenesin 100 MG/5ML syrup Commonly known as: ROBITUSSIN Take 200 mg every 6 (six) hours as needed by mouth for cough.   guaiFENesin-codeine 100-10 MG/5ML syrup Commonly known as: ROBITUSSIN AC Take 5 mLs by mouth every 6 (six) hours as needed for cough.   insulin glargine 100 unit/mL Sopn Commonly known as: LANTUS Inject 25-55 Units into the skin See admin instructions. 55 units in the morning and 25 units in the evening   insulin lispro 100 UNIT/ML KiwkPen Commonly known as: HUMALOG Inject 10 Units into the skin 3 (three) times daily before meals. *Do Not to inject for levels below 100   loperamide 2 MG tablet Commonly known as: IMODIUM A-D Take 2 mg by mouth as needed for diarrhea or loose stools (up to 8 doses in 24 hrs.).   Melatonin 5 MG Caps Take 10 mg by mouth at bedtime.   pantoprazole 40 MG tablet Commonly known as: PROTONIX Take 1 tablet (40 mg total) by mouth 2 (two) times daily.   polyvinyl alcohol 1.4 % ophthalmic solution Commonly known as: LIQUIFILM TEARS Place 1 drop into both eyes every 4 (four) hours as needed for dry eyes.   sertraline 50 MG tablet Commonly known as: ZOLOFT Take 75 mg by mouth daily.   sevelamer carbonate 800 MG tablet Commonly known as: RENVELA Take 1,600-2,400 mg by mouth See admin instructions. 2400 mg 3 times daily with meals and 1600 mg twice daily with snacks        Major procedures and Radiology Reports - PLEASE review detailed and final reports for all details, in brief -   No results found.  Micro Results    Recent Results (from the past 240 hour(s))  SARS Coronavirus 2 (CEPHEID - Performed in [place] hospital lab), Hosp Order     Status: None   Collection Time: 02/24/19 11:30 PM   Specimen: Nasopharyngeal Swab  Result Value Ref Range Status   SARS Coronavirus 2 NEGATIVE NEGATIVE Final    Comment: (NOTE) If result is NEGATIVE SARS-CoV-2 target nucleic acids are NOT DETECTED. The SARS-CoV-2 RNA is generally detectable in upper and lower  respiratory specimens during the acute phase of infection. The lowest  concentration of SARS-CoV-2 viral copies this assay can detect is 250  copies / mL. A negative result does not preclude SARS-CoV-2 infection  and  should not be used as the sole basis for treatment or other  patient management decisions.  A negative result may occur with  improper specimen collection / handling, submission of specimen other  than nasopharyngeal swab, presence of viral mutation(s) within the  areas targeted by this assay, and inadequate number of viral copies  (<250 copies / mL). A negative result must be combined with clinical  observations, patient history, and epidemiological information. If result is POSITIVE SARS-CoV-2 target nucleic acids are DETECTED. The SARS-CoV-2 RNA is generally detectable in upper and lower  respiratory specimens dur ing the acute phase of infection.  Positive  results are indicative of active infection with SARS-CoV-2.  Clinical  correlation with patient history and other diagnostic information is  necessary to determine patient infection status.  Positive results do  not rule out bacterial infection or co-infection with other viruses. If result is PRESUMPTIVE POSTIVE SARS-CoV-2 nucleic acids MAY BE PRESENT.   A presumptive positive result was obtained on the submitted specimen   and confirmed on repeat testing.  While 2019 novel coronavirus  (SARS-CoV-2) nucleic acids may be present in the submitted sample  additional confirmatory testing may be necessary for epidemiological  and / or clinical management purposes  to differentiate between  SARS-CoV-2 and other Sarbecovirus currently known to infect humans.  If clinically indicated additional testing with an alternate test  methodology [phone]) is advised. The SARS-CoV-2 RNA is generally  detectable in upper and lower respiratory sp ecimens during the acute  phase of infection. The expected result is Negative. Fact Sheet for Patients:  StrictlyIdeas.no Fact Sheet for Healthcare Providers: BankingDealers.co.za This test is not yet approved or cleared by the [place] FDA and has been authorized for detection and/or diagnosis of SARS-CoV-2 by FDA under an Emergency Use Authorization (EUA).  This EUA will remain in effect (meaning this test can be used) for the duration of the COVID-19 declaration under Section 564(b)(1) of the Act, 21 U.S.C. section 360bbb-3(b)(1), unless the authorization is terminated or revoked sooner. Performed at [facility], [address] [place], [place] [address]   MRSA PCR Screening     Status: None   Collection Time: 02/25/19  1:14 AM   Specimen: Nasopharyngeal  Result Value Ref Range Status   MRSA by PCR NEGATIVE NEGATIVE Final    Comment:        The GeneXpert MRSA Assay (FDA approved for NASAL specimens only), is one component of a comprehensive MRSA colonization surveillance program. It is not intended to diagnose MRSA infection nor to guide or monitor treatment for MRSA infections. Performed at [facility], [address], [place], [place] [address]        Today   Subjective    Tanya Lucero today has no new concerns, chest pains no palpitations no further dizziness          Patient has been seen and  examined prior to discharge   Objective   Blood pressure (!) 141/65, pulse 84, temperature 97.8 F (36.6 C), temperature source Oral, resp. rate 17, height 5\' 2"  (1.575 m), weight 106.5 kg, SpO2 98 %.   Intake/Output Summary (Last 24 hours) at 02/28/2019 1233 Last data filed at 02/28/2019 0750 Gross per 24 hour  Intake 1080 ml  Output 3000 ml  Net -1920 ml    Exam Gen:- Awake Alert, no acute distress  HEENT:- [place].AT, No sclera icterus Neck-Supple Neck,No JVD,.  Lungs-  CTAB , good air movement bilaterally  CV- S1, S2 normal,  regular Abd-  +ve B.Sounds, Abd Soft, No tenderness,    Extremity/Skin:-Improved edema,   good pulses, right forearm AV fistula with positive thrill and bruit Psych-affect is appropriate, oriented x3 Neuro-no new focal deficits, no tremors    Data Review   CBC w Diff:  Lab Results  Component Value Date   WBC 4.0 02/28/2019   HGB 7.2 (L) 02/28/2019   HCT 22.4 (L) 02/28/2019   PLT 80 (L) 02/28/2019   LYMPHOPCT 21 02/28/2019   MONOPCT 8 02/28/2019   EOSPCT 1 02/28/2019   BASOPCT 0 02/28/2019    CMP:  Lab Results  Component Value Date   NA 136 02/28/2019   NA 136 02/28/2019   K 3.9 02/28/2019   K 3.8 02/28/2019   CL 93 (L) 02/28/2019   CL 94 (L) 02/28/2019   CO2 27 02/28/2019   CO2 30 02/28/2019   BUN 30 (H) 02/28/2019   BUN 29 (H) 02/28/2019   CREATININE 5.20 (H) 02/28/2019   CREATININE 5.17 (H) 02/28/2019   PROT 6.5 02/28/2019   ALBUMIN 3.5 02/28/2019   ALBUMIN 3.5 02/28/2019   BILITOT 0.5 02/28/2019   ALKPHOS 72 02/28/2019   AST 18 02/28/2019   ALT 15 02/28/2019  .   Total Discharge time is about 33 minutes  Roxan Hockey M.D on 02/28/2019 at 12:33 PM  Go to [email or web address] -  for contact info  [facility] - Office  [phone]

## 2019-02-28 NOTE — Care Management Important Message (Signed)
Important Message  Patient Details  Name: Tanya Lucero MRN: [id number] Date of Birth: 1951-07-31   Medicare Important Message Given:  Yes     Tommy Medal 02/28/2019, 2:27 PM

## 2019-02-28 NOTE — Progress Notes (Signed)
  [facility] Progress Note   Assessment/ Plan:   1 GIB ongoing issue--> s/p 1 u pRBCs, no scope here in-house for now. 2 ESRD: MWF normally--> off schedule yesterday.  She is for d/c today so recommend asking Javier Docker for off schedule spot either this afternoon (pt reports she goes first shift) or tomorrow (Thursday). 3 Hypertension: Improved with UF 4. Anemia of ESRD: will give ESA, started Fe load yesterday 5. Metabolic Bone Disease: needs ^ binder--> renvela 2400 u TID and 1600 with snacks 6 DM controlled--. On a healthy dose of gabapentin- recommend decreasing dose if possible 7 Obesity 8 Smokng 9 DJD 10 DDD 11 Gout   Subjective:    Seen in room.  HD late last night. For d/c today per notes.  No complaints today.     Objective:   BP (!) 145/59 (BP Location: Left Arm)   Pulse 70   Temp 98.4 F (36.9 C) (Oral)   Resp 17   Ht 5\' 2"  (1.575 m)   Wt 106.5 kg   SpO2 94%   BMI 42.94 kg/m   Physical Exam: Gen: NAD CVS: RRR no m/r/g Resp: clear bilaterally no c/w/r Abd: soft, nontender, nondistended, NABS Ext:1+ LE edema  Labs: BMET Recent Labs  Lab 02/24/19 2100 02/25/19 1141 02/26/19 0458 02/27/19 0625 02/28/19 0649  NA 138 132* 135 134* 136  136  K 3.6 4.0 3.9 4.3 3.8  3.9  CL 97* 95* 94* 97* 94*  93*  CO2 27 21* 25 23 30  27   GLUCOSE 79 130* 64* 76 81  80  BUN 41* 49* 61* 75* 29*  30*  CREATININE 6.50* 7.38* 8.35* 9.34* 5.17*  5.20*  CALCIUM 8.5* 8.1* 8.3* 8.3* 8.2*  8.1*  PHOS  --   --  6.8* 6.8* 4.4   CBC Recent Labs  Lab 02/24/19 2100 02/25/19 1141 02/26/19 0458 02/27/19 0625 02/28/19 0649  WBC 5.4 4.8 4.6 3.9* 4.0  NEUTROABS 3.4  --   --   --  2.8  HGB 6.9* 8.4* 7.2* 7.1* 7.2*  HCT 21.4* 26.1* 23.1* 21.8* 22.4*  MCV 109.7* 108.3* 109.0* 107.4* 107.7*  PLT 139* 141* 128* 129* 80*    @IMGRELPRIORS @ Medications:    . ALPRAZolam  0.25 mg Oral BID  . buPROPion  75 mg Oral Daily  . Chlorhexidine Gluconate Cloth  6  each Topical Q0600  . darbepoetin (ARANESP) injection - DIALYSIS  200 mcg Intravenous Q Tue-HD  . dextrose  1 ampule Intravenous Once  . gabapentin  300 mg Oral BID  . gabapentin  600 mg Oral QHS  . insulin aspart  0-5 Units Subcutaneous QHS  . insulin aspart  0-9 Units Subcutaneous TID WC  . insulin glargine  55 Units Subcutaneous Daily  . loratadine  10 mg Oral Daily  . Melatonin  10.5 mg Oral QHS  . multivitamin  1 tablet Oral QHS  . pantoprazole  40 mg Oral BID  . sertraline  75 mg Oral Daily  . sevelamer carbonate  1,600 mg Oral With snacks  . sevelamer carbonate  2,400 mg Oral TID WC  . sodium chloride flush  3 mL Intravenous Q12H     Madelon Lips, MD 02/28/2019, 9:46 AM

## 2019-03-13 ENCOUNTER — Ambulatory Visit (INDEPENDENT_AMBULATORY_CARE_PROVIDER_SITE_OTHER): Payer: Medicare Other | Admitting: Internal Medicine

## 2019-03-13 ENCOUNTER — Encounter (INDEPENDENT_AMBULATORY_CARE_PROVIDER_SITE_OTHER): Payer: Self-pay | Admitting: Internal Medicine

## 2019-03-13 ENCOUNTER — Other Ambulatory Visit: Payer: Self-pay

## 2019-03-13 VITALS — BP 109/69 | HR 83 | Temp 98.3°F | Ht 62.0 in | Wt 223.8 lb

## 2019-03-13 DIAGNOSIS — R195 Other fecal abnormalities: Secondary | ICD-10-CM | POA: Diagnosis not present

## 2019-03-13 DIAGNOSIS — D508 Other iron deficiency anemias: Secondary | ICD-10-CM | POA: Diagnosis not present

## 2019-03-13 LAB — HEMOGLOBIN AND HEMATOCRIT, BLOOD
HCT: 21.7 % — ABNORMAL LOW (ref 35.0–45.0)
Hemoglobin: 7.2 g/dL — ABNORMAL LOW (ref 11.7–15.5)

## 2019-03-13 NOTE — Patient Instructions (Signed)
H and H today. OV in 3 months.

## 2019-03-13 NOTE — Progress Notes (Signed)
   Subjective:    Patient ID: Tanya Lucero, female    DOB: [birth year], 68 y.o.   MRN: [id number]  HPI Here for f/u after recent admission to [facility]  (02/2019) for anemia. Admission hemoglobin 6.9. During hospital stay, per records, had not had BRRB or melena. She says she is doing okay. She is tired. She get out of breath with walking any distances. Her appetite is okay. She has gained about 8 pounds.She says her stools are dark but not black. She says if there was any BRRB she could not see it.  Her stool card in hospital /23/2020 was positive. She received 1 unit of blood while in hospital.   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 Clinically without any melena or hematochezia, and nursing staff confirm this as well. Unknown hemoccult status this admission.  Her last colonoscopy was in 2017 with 4 polyps removed. Two were tubular adenomas. Two were hyperplastic.   Hx of diabetes, hypertension and ESRD, IDA  Dialysis M-W-F   Review of Systems     Objective:   Physical Exam Blood pressure 109/69, pulse 83, temperature 98.3 F (36.8 C), height 5\' 2"  (1.575 m), weight 223 lb 12.8 oz (101.5 kg). Alert and oriented. Skin warm and dry. Oral mucosa is moist.   . Sclera anicteric, conjunctivae is pink. Thyroid not enlarged. No cervical lymphadenopathy. Lungs clear. Heart regular rate and rhythm.  Murmur heard.  Abdomen is soft. Bowel sounds are positive. No hepatomegaly. No abdominal masses felt. No tenderness.  No edema to lower extremities.Stool brown and slightly positive.          Assessment & Plan:  IDA. Guaiac positive stool. . Am going to get an H and H. OV in 3 months.

## 2019-03-15 ENCOUNTER — Other Ambulatory Visit (INDEPENDENT_AMBULATORY_CARE_PROVIDER_SITE_OTHER): Payer: Self-pay | Admitting: Internal Medicine

## 2019-03-15 DIAGNOSIS — D500 Iron deficiency anemia secondary to blood loss (chronic): Secondary | ICD-10-CM

## 2019-03-15 NOTE — Progress Notes (Signed)
H

## 2019-03-19 ENCOUNTER — Other Ambulatory Visit (HOSPITAL_COMMUNITY)
Admission: RE | Admit: 2019-03-19 | Discharge: 2019-03-19 | Disposition: A | Discharge status: Home / Self Care | Payer: Medicare Other | Source: Ambulatory Visit | Attending: Internal Medicine | Admitting: Internal Medicine

## 2019-03-19 ENCOUNTER — Other Ambulatory Visit: Payer: Self-pay

## 2019-03-19 ENCOUNTER — Other Ambulatory Visit (INDEPENDENT_AMBULATORY_CARE_PROVIDER_SITE_OTHER): Payer: Self-pay | Admitting: Internal Medicine

## 2019-03-19 ENCOUNTER — Telehealth (INDEPENDENT_AMBULATORY_CARE_PROVIDER_SITE_OTHER): Payer: Self-pay | Admitting: Internal Medicine

## 2019-03-19 DIAGNOSIS — D509 Iron deficiency anemia, unspecified: Secondary | ICD-10-CM | POA: Insufficient documentation

## 2019-03-19 DIAGNOSIS — D500 Iron deficiency anemia secondary to blood loss (chronic): Secondary | ICD-10-CM

## 2019-03-19 LAB — HEMOGLOBIN AND HEMATOCRIT, BLOOD
HCT: 22.5 % — ABNORMAL LOW (ref 36.0–46.0)
Hemoglobin: 7 g/dL — ABNORMAL LOW (ref 12.0–15.0)

## 2019-03-19 NOTE — Telephone Encounter (Signed)
Patient scheduled for 03/22/19, [facility] is aware

## 2019-03-19 NOTE — Telephone Encounter (Signed)
Tanya Lucero, Needs a unit of blood. Hemoglobin in 7. Sister is aware.  She has been typed and screened.

## 2019-03-19 NOTE — Progress Notes (Signed)
H and H and T and C ordered. Talked with sister and she will try to get in touch with [facility]. I tried calling [facility] but did not get an answer

## 2019-03-20 LAB — PREPARE RBC (CROSSMATCH)

## 2019-03-22 ENCOUNTER — Other Ambulatory Visit: Payer: Self-pay

## 2019-03-22 ENCOUNTER — Encounter (HOSPITAL_COMMUNITY): Payer: Self-pay

## 2019-03-22 ENCOUNTER — Encounter (HOSPITAL_COMMUNITY)
Admission: RE | Admit: 2019-03-22 | Discharge: 2019-03-22 | Disposition: A | Discharge status: Home / Self Care | Payer: Medicare Other | Source: Ambulatory Visit | Attending: Internal Medicine | Admitting: Internal Medicine

## 2019-03-22 DIAGNOSIS — D649 Anemia, unspecified: Secondary | ICD-10-CM | POA: Diagnosis present

## 2019-03-22 MED ORDER — SODIUM CHLORIDE 0.9% IV SOLUTION
Freq: Once | INTRAVENOUS | Status: AC
  Administered 2019-03-22: 10:00:00 via INTRAVENOUS

## 2019-03-23 LAB — BPAM RBC
Blood Product Expiration Date: 202008182359
ISSUE DATE / TIME: 202007160943
Unit Type and Rh: 5100

## 2019-03-23 LAB — TYPE AND SCREEN
ABO/RH(D): O POS
Antibody Screen: NEGATIVE
Donor AG Type: NEGATIVE
Unit division: 0

## 2019-05-04 ENCOUNTER — Encounter (HOSPITAL_COMMUNITY): Payer: Self-pay | Admitting: Emergency Medicine

## 2019-05-04 ENCOUNTER — Emergency Department (HOSPITAL_COMMUNITY): Payer: Medicare Other

## 2019-05-04 ENCOUNTER — Inpatient Hospital Stay (HOSPITAL_COMMUNITY)
Admission: EM | Admit: 2019-05-04 | Discharge: 2019-05-06 | DRG: 377 | Disposition: A | Discharge status: Other Acute Inpatient Hospital | Payer: Medicare Other | Source: Other Acute Inpatient Hospital | Attending: Internal Medicine | Admitting: Internal Medicine

## 2019-05-04 ENCOUNTER — Other Ambulatory Visit: Payer: Self-pay

## 2019-05-04 DIAGNOSIS — E1142 Type 2 diabetes mellitus with diabetic polyneuropathy: Secondary | ICD-10-CM | POA: Diagnosis present

## 2019-05-04 DIAGNOSIS — E1121 Type 2 diabetes mellitus with diabetic nephropathy: Secondary | ICD-10-CM | POA: Diagnosis present

## 2019-05-04 DIAGNOSIS — I120 Hypertensive chronic kidney disease with stage 5 chronic kidney disease or end stage renal disease: Secondary | ICD-10-CM | POA: Diagnosis present

## 2019-05-04 DIAGNOSIS — N2581 Secondary hyperparathyroidism of renal origin: Secondary | ICD-10-CM | POA: Diagnosis not present

## 2019-05-04 DIAGNOSIS — K219 Gastro-esophageal reflux disease without esophagitis: Secondary | ICD-10-CM | POA: Diagnosis present

## 2019-05-04 DIAGNOSIS — K922 Gastrointestinal hemorrhage, unspecified: Secondary | ICD-10-CM

## 2019-05-04 DIAGNOSIS — F17210 Nicotine dependence, cigarettes, uncomplicated: Secondary | ICD-10-CM | POA: Diagnosis present

## 2019-05-04 DIAGNOSIS — Z794 Long term (current) use of insulin: Secondary | ICD-10-CM | POA: Diagnosis not present

## 2019-05-04 DIAGNOSIS — D500 Iron deficiency anemia secondary to blood loss (chronic): Secondary | ICD-10-CM | POA: Diagnosis present

## 2019-05-04 DIAGNOSIS — F329 Major depressive disorder, single episode, unspecified: Secondary | ICD-10-CM | POA: Diagnosis present

## 2019-05-04 DIAGNOSIS — Z992 Dependence on renal dialysis: Secondary | ICD-10-CM | POA: Diagnosis not present

## 2019-05-04 DIAGNOSIS — F419 Anxiety disorder, unspecified: Secondary | ICD-10-CM | POA: Diagnosis present

## 2019-05-04 DIAGNOSIS — Z90710 Acquired absence of both cervix and uterus: Secondary | ICD-10-CM

## 2019-05-04 DIAGNOSIS — Z79899 Other long term (current) drug therapy: Secondary | ICD-10-CM | POA: Diagnosis not present

## 2019-05-04 DIAGNOSIS — Z833 Family history of diabetes mellitus: Secondary | ICD-10-CM | POA: Diagnosis not present

## 2019-05-04 DIAGNOSIS — N186 End stage renal disease: Secondary | ICD-10-CM | POA: Diagnosis present

## 2019-05-04 DIAGNOSIS — E1122 Type 2 diabetes mellitus with diabetic chronic kidney disease: Secondary | ICD-10-CM | POA: Diagnosis present

## 2019-05-04 DIAGNOSIS — K31811 Angiodysplasia of stomach and duodenum with bleeding: Secondary | ICD-10-CM | POA: Diagnosis present

## 2019-05-04 DIAGNOSIS — D696 Thrombocytopenia, unspecified: Secondary | ICD-10-CM | POA: Diagnosis present

## 2019-05-04 DIAGNOSIS — Z85828 Personal history of other malignant neoplasm of skin: Secondary | ICD-10-CM

## 2019-05-04 DIAGNOSIS — D72819 Decreased white blood cell count, unspecified: Secondary | ICD-10-CM | POA: Diagnosis present

## 2019-05-04 DIAGNOSIS — K625 Hemorrhage of anus and rectum: Secondary | ICD-10-CM | POA: Diagnosis present

## 2019-05-04 DIAGNOSIS — Z6841 Body Mass Index (BMI) 40.0 and over, adult: Secondary | ICD-10-CM

## 2019-05-04 DIAGNOSIS — D649 Anemia, unspecified: Secondary | ICD-10-CM | POA: Diagnosis present

## 2019-05-04 DIAGNOSIS — K31819 Angiodysplasia of stomach and duodenum without bleeding: Secondary | ICD-10-CM | POA: Diagnosis not present

## 2019-05-04 DIAGNOSIS — R195 Other fecal abnormalities: Secondary | ICD-10-CM | POA: Diagnosis not present

## 2019-05-04 DIAGNOSIS — Z20828 Contact with and (suspected) exposure to other viral communicable diseases: Secondary | ICD-10-CM | POA: Diagnosis present

## 2019-05-04 DIAGNOSIS — E669 Obesity, unspecified: Secondary | ICD-10-CM | POA: Diagnosis present

## 2019-05-04 DIAGNOSIS — I10 Essential (primary) hypertension: Secondary | ICD-10-CM | POA: Diagnosis present

## 2019-05-04 LAB — COMPREHENSIVE METABOLIC PANEL
ALT: 16 U/L (ref 0–44)
AST: 19 U/L (ref 15–41)
Albumin: 3.7 g/dL (ref 3.5–5.0)
Alkaline Phosphatase: 83 U/L (ref 38–126)
Anion gap: 9 (ref 5–15)
BUN: 17 mg/dL (ref 8–23)
CO2: 30 mmol/L (ref 22–32)
Calcium: 8.3 mg/dL — ABNORMAL LOW (ref 8.9–10.3)
Chloride: 99 mmol/L (ref 98–111)
Creatinine, Ser: 3.27 mg/dL — ABNORMAL HIGH (ref 0.44–1.00)
GFR calc Af Amer: 16 mL/min — ABNORMAL LOW (ref >60)
GFR calc non Af Amer: 14 mL/min — ABNORMAL LOW (ref >60)
Glucose, Bld: 115 mg/dL — ABNORMAL HIGH (ref 70–99)
Potassium: 3.3 mmol/L — ABNORMAL LOW (ref 3.5–5.1)
Sodium: 138 mmol/L (ref 135–145)
Total Bilirubin: 0.5 mg/dL (ref 0.3–1.2)
Total Protein: 6.9 g/dL (ref 6.5–8.1)

## 2019-05-04 LAB — CBC WITH DIFFERENTIAL/PLATELET
Abs Immature Granulocytes: 0.02 10*3/uL (ref 0.00–0.07)
Basophils Absolute: 0 10*3/uL (ref 0.0–0.1)
Basophils Relative: 0 %
Eosinophils Absolute: 0.1 10*3/uL (ref 0.0–0.5)
Eosinophils Relative: 2 %
HCT: 19.8 % — ABNORMAL LOW (ref 36.0–46.0)
Hemoglobin: 6.3 g/dL — CL (ref 12.0–15.0)
Immature Granulocytes: 1 %
Lymphocytes Relative: 22 %
Lymphs Abs: 1 10*3/uL (ref 0.7–4.0)
MCH: 34.2 pg — ABNORMAL HIGH (ref 26.0–34.0)
MCHC: 31.8 g/dL (ref 30.0–36.0)
MCV: 107.6 fL — ABNORMAL HIGH (ref 80.0–100.0)
Monocytes Absolute: 0.3 10*3/uL (ref 0.1–1.0)
Monocytes Relative: 7 %
Neutro Abs: 3 10*3/uL (ref 1.7–7.7)
Neutrophils Relative %: 68 %
Platelets: 143 10*3/uL — ABNORMAL LOW (ref 150–400)
RBC: 1.84 MIL/uL — ABNORMAL LOW (ref 3.87–5.11)
RDW: 18.4 % — ABNORMAL HIGH (ref 11.5–15.5)
WBC: 4.3 10*3/uL (ref 4.0–10.5)
nRBC: 0 % (ref 0.0–0.2)

## 2019-05-04 LAB — POC OCCULT BLOOD, ED: Fecal Occult Bld: POSITIVE — AB

## 2019-05-04 LAB — SARS CORONAVIRUS 2 BY RT PCR (HOSPITAL ORDER, PERFORMED IN ~~LOC~~ HOSPITAL LAB): SARS Coronavirus 2: NEGATIVE

## 2019-05-04 LAB — CBG MONITORING, ED: Glucose-Capillary: 209 mg/dL — ABNORMAL HIGH (ref 70–99)

## 2019-05-04 LAB — PREPARE RBC (CROSSMATCH)

## 2019-05-04 MED ORDER — CARVEDILOL 3.125 MG PO TABS
6.2500 mg | ORAL_TABLET | Freq: Two times a day (BID) | ORAL | Status: DC
  Administered 2019-05-05 – 2019-05-06 (×3): 6.25 mg via ORAL
  Filled 2019-05-04: qty 2
  Filled 2019-05-04: qty 1
  Filled 2019-05-04: qty 2

## 2019-05-04 MED ORDER — INSULIN ASPART 100 UNIT/ML ~~LOC~~ SOLN: 10.0000 [IU] | Freq: Three times a day (TID) | SUBCUTANEOUS | Status: DC

## 2019-05-04 MED ORDER — FUROSEMIDE 10 MG/ML IJ SOLN
20.0000 mg | Freq: Once | INTRAMUSCULAR | Status: AC
  Administered 2019-05-04: 20 mg via INTRAVENOUS
  Filled 2019-05-04: qty 2

## 2019-05-04 MED ORDER — INSULIN GLARGINE 100 UNITS/ML SOLOSTAR PEN: 25.0000 [IU] | PEN_INJECTOR | SUBCUTANEOUS | Status: DC

## 2019-05-04 MED ORDER — SERTRALINE HCL 50 MG PO TABS
75.0000 mg | ORAL_TABLET | Freq: Every day | ORAL | Status: DC
  Administered 2019-05-06: 75 mg via ORAL
  Filled 2019-05-04: qty 2

## 2019-05-04 MED ORDER — MELATONIN 5 MG PO TABS
10.0000 mg | ORAL_TABLET | Freq: Every day | ORAL | Status: DC
  Filled 2019-05-04 (×2): qty 2

## 2019-05-04 MED ORDER — PANTOPRAZOLE SODIUM 40 MG IV SOLR
40.0000 mg | Freq: Two times a day (BID) | INTRAVENOUS | Status: DC
  Administered 2019-05-04 – 2019-05-06 (×4): 40 mg via INTRAVENOUS
  Filled 2019-05-04 (×4): qty 40

## 2019-05-04 MED ORDER — ONDANSETRON HCL 4 MG/2ML IJ SOLN: 4.0000 mg | Freq: Four times a day (QID) | INTRAMUSCULAR | Status: DC | PRN

## 2019-05-04 MED ORDER — ALPRAZOLAM 0.25 MG PO TABS
0.2500 mg | ORAL_TABLET | Freq: Two times a day (BID) | ORAL | Status: DC
  Administered 2019-05-04 – 2019-05-06 (×4): 0.25 mg via ORAL
  Filled 2019-05-04 (×4): qty 1

## 2019-05-04 MED ORDER — ALBUTEROL SULFATE HFA 108 (90 BASE) MCG/ACT IN AERS
4.0000 | INHALATION_SPRAY | Freq: Once | RESPIRATORY_TRACT | Status: AC
  Administered 2019-05-04: 4 via RESPIRATORY_TRACT
  Filled 2019-05-04: qty 6.7

## 2019-05-04 MED ORDER — SEVELAMER CARBONATE 800 MG PO TABS
2400.0000 mg | ORAL_TABLET | Freq: Three times a day (TID) | ORAL | Status: DC
  Administered 2019-05-05 – 2019-05-06 (×4): 2400 mg via ORAL
  Filled 2019-05-04 (×10): qty 3

## 2019-05-04 MED ORDER — INSULIN ASPART 100 UNIT/ML ~~LOC~~ SOLN
0.0000 [IU] | Freq: Three times a day (TID) | SUBCUTANEOUS | Status: DC
  Administered 2019-05-05 – 2019-05-06 (×2): 3 [IU] via SUBCUTANEOUS
  Filled 2019-05-04: qty 1

## 2019-05-04 MED ORDER — LOPERAMIDE HCL 2 MG PO CAPS: 2.0000 mg | ORAL_CAPSULE | ORAL | Status: DC | PRN

## 2019-05-04 MED ORDER — BUPROPION HCL 75 MG PO TABS
75.0000 mg | ORAL_TABLET | Freq: Every day | ORAL | Status: DC
  Administered 2019-05-05 – 2019-05-06 (×2): 75 mg via ORAL
  Filled 2019-05-04 (×3): qty 1

## 2019-05-04 MED ORDER — GUAIFENESIN 100 MG/5ML PO SYRP
200.0000 mg | ORAL_SOLUTION | Freq: Four times a day (QID) | ORAL | Status: DC | PRN
  Filled 2019-05-04: qty 10

## 2019-05-04 MED ORDER — INSULIN ASPART 100 UNIT/ML ~~LOC~~ SOLN
0.0000 [IU] | Freq: Every day | SUBCUTANEOUS | Status: DC
  Administered 2019-05-04: 2 [IU] via SUBCUTANEOUS
  Filled 2019-05-04: qty 1

## 2019-05-04 MED ORDER — TRAZODONE HCL 50 MG PO TABS
25.0000 mg | ORAL_TABLET | Freq: Every evening | ORAL | Status: DC | PRN
  Administered 2019-05-04 – 2019-05-05 (×2): 25 mg via ORAL
  Filled 2019-05-04 (×2): qty 1

## 2019-05-04 MED ORDER — SODIUM CHLORIDE 0.9% IV SOLUTION: Freq: Once | INTRAVENOUS | Status: DC

## 2019-05-04 MED ORDER — POLYVINYL ALCOHOL 1.4 % OP SOLN
1.0000 [drp] | OPHTHALMIC | Status: DC | PRN
  Filled 2019-05-04: qty 15

## 2019-05-04 MED ORDER — ONDANSETRON HCL 4 MG PO TABS: 4.0000 mg | ORAL_TABLET | Freq: Four times a day (QID) | ORAL | Status: DC | PRN

## 2019-05-04 MED ORDER — EMETROL 1.87-1.87-21.5 PO SOLN
30.0000 mL | ORAL | Status: DC | PRN
  Filled 2019-05-04: qty 118

## 2019-05-04 MED ORDER — GABAPENTIN 300 MG PO CAPS: 300.0000 mg | ORAL_CAPSULE | ORAL | Status: DC

## 2019-05-04 MED ORDER — FUROSEMIDE 40 MG PO TABS
40.0000 mg | ORAL_TABLET | Freq: Every day | ORAL | Status: DC
  Administered 2019-05-05 – 2019-05-06 (×2): 40 mg via ORAL
  Filled 2019-05-04 (×2): qty 1

## 2019-05-04 MED ORDER — GABAPENTIN 300 MG PO CAPS
600.0000 mg | ORAL_CAPSULE | Freq: Every day | ORAL | Status: DC
  Administered 2019-05-04 – 2019-05-05 (×2): 600 mg via ORAL
  Filled 2019-05-04 (×2): qty 2

## 2019-05-04 NOTE — ED Notes (Signed)
Date and time results received: 05/04/19 5:41 PM   Test: hgb Critical Value: 6.3  Name of Provider Notified: Lily Kocher   Orders Received? Or Actions Taken?:

## 2019-05-04 NOTE — ED Triage Notes (Signed)
Seen from HD for lower hemoglobin.

## 2019-05-04 NOTE — H&P (Signed)
History and Physical  Tanya Lucero:[id number] DOB: 06-Jan-1951 DOA: 05/04/2019  Referring physician: Lily Kocher, PA-C, ED provider PCP: Scotty Court, DO  Outpatient Specialists:   Patient Coming From: home from dialysis  Chief Complaint: Low hemoglobin, fatigue  HPI: Tanya Lucero is a 68 y.o. female with a history of ESRD on hemodialysis, DM2, HTN, history of slow GI bleed with chronic anemia requiring multiple blood transfusions.  Patient's last transfusion was in the middle of July which was arranged by her PCP.  Patient was at dialysis and had some blood work which showed a hemoglobin of 6.1.  She was in brought to the emergency room by EMS.  Repeat CBC shows globin of 6.3.  She was emergently transfused 1 unit of emergency release blood.  She states that she is fatigued all the time and has some shortness of breath all the time, but it has been worse lately.  Shortness of breath worse with activity.  Improved with rest.  No other palliating or provoking factors.  She denies melena or blood in her stools.  Emergency Department Course: As above, patient transfused with 1 unit of emergency release blood.  Awaiting more units from [facility] long [facility] due to antibodies.  Review of Systems:  Pt denies any fevers, chills, nausea, vomiting, diarrhea, constipation, abdominal pain, shortness of breath, dyspnea on exertion, orthopnea, cough, wheezing, palpitations, headache, vision changes, lightheadedness, dizziness, melena, rectal bleeding.  Review of systems are otherwise negative  Past Medical History:  Diagnosis Date  . Anemia   . Anxiety   . Arthritis   . Chronic kidney disease    neuphrotic syndrome-05/2013  . Constipation   . Decreased vision    left eye  . Depression   . Diabetes mellitus    Type 2  . Diabetic neuropathy ([place])   . Diverticulitis   . Dry skin   . GERD (gastroesophageal reflux disease)   . GI bleed 07/05/2017  . Gout   . Headache   .  Hypertension   . Neuropathy   . Shortness of breath dyspnea   . Skin cancer    Past Surgical History:  Procedure Laterality Date  . ABDOMINAL HYSTERECTOMY    . ADENOIDECTOMY    . APPENDECTOMY     taken out with hysterectomy  . AV FISTULA PLACEMENT Right 09/13/2014   Procedure: Creation of Right Arm RADIOCEPHALIC ARTERIOVENOUS (AV) FISTULA ;  Surgeon: [name], MD;  Location: [facility];  Service: Vascular;  Laterality: Right;  . BACK SURGERY     lumbar -   . CATARACT EXTRACTION W/PHACO Left 07/02/2013   Procedure: CATARACT EXTRACTION PHACO AND INTRAOCULAR LENS PLACEMENT (IOC);  Surgeon: [name], MD;  Location: [facility];  Service: Ophthalmology;  Laterality: Left;  CDE:  6.46  . CATARACT EXTRACTION W/PHACO Right 07/12/2013   Procedure: CATARACT EXTRACTION PHACO AND INTRAOCULAR LENS PLACEMENT (IOC);  Surgeon: [name], MD;  Location: [facility];  Service: Ophthalmology;  Laterality: Right;  CDE:12.48  . COLONOSCOPY N/A 12/11/2015   Procedure: COLONOSCOPY;  Surgeon: [name], MD;  [facility];  Service: Endoscopy;  Laterality: N/A;  1:55  . ESOPHAGOGASTRODUODENOSCOPY N/A 03/11/2016   Procedure: ESOPHAGOGASTRODUODENOSCOPY (EGD);  Surgeon: [name], MD;  [facility];  Service: Endoscopy;  Laterality: N/A;  1230 - Last pt of the day-Dr. Laural Golden going to office to see pt's  . ESOPHAGOGASTRODUODENOSCOPY N/A 07/21/2017   Procedure: ESOPHAGOGASTRODUODENOSCOPY (EGD);  Surgeon: [name], MD;  [facility];  Service: Endoscopy;  Laterality: N/A;  3:00  . ESOPHAGOGASTRODUODENOSCOPY N/A 11/30/2018   Procedure: ESOPHAGOGASTRODUODENOSCOPY (EGD);  Surgeon: [name], MD;  [facility];  Service: Endoscopy;  Laterality: N/A;  9:55 - spoke with the facility and advised new arrival time 10:20  . ESOPHAGOGASTRODUODENOSCOPY N/A 01/11/2019   Procedure: ESOPHAGOGASTRODUODENOSCOPY (EGD);  Surgeon: [name], MD;  [facility];   Service: Endoscopy;  Laterality: N/A;  155 - office spoke with facility and they know to have pt here at 11:00  . FOOT SURGERY Right    x2-heel spur   . GIVENS CAPSULE STUDY N/A 11/09/2016   Procedure: GIVENS CAPSULE STUDY;  Surgeon: [name], MD;  [facility];  Service: Endoscopy;  Laterality: N/A;  7:30  . GIVENS CAPSULE STUDY N/A 11/10/2018   Procedure: GIVENS CAPSULE STUDY;  Surgeon: [name], MD;  [facility];  Service: Endoscopy;  Laterality: N/A;  . REVISON OF ARTERIOVENOUS FISTULA Right 01/05/2016   Procedure: REVISON OF ARTERIOVENOUS FISTULA- right forearm;  Surgeon: [name], MD;  Location: [facility];  Service: Vascular;  Laterality: Right;  . SKIN CANCER EXCISION     forehead  . TONSILLECTOMY     Social History:  reports that she has been smoking cigarettes. She has a 10.00 pack-year smoking history. She has never used smokeless tobacco. She reports that she does not drink alcohol or use drugs. Patient lives at home  Allergies  Allergen Reactions  . Penicillins Rash    Did it involve swelling of the face/tongue/throat, SOB, or low BP? Unknown Did it involve sudden or severe rash/hives, skin peeling, or any reaction on the inside of your mouth or nose? Unknown Did you need to seek medical attention at a hospital or doctor's office? Unknown When did it last happen?unknown If all above answers are "NO", may proceed with cephalosporin use.      Family History  Problem Relation Age of Onset  . Multiple myeloma Mother   . Diabetes Mother   . Cirrhosis Mother   . Heart attack Father 30  . Colon cancer Neg Hx       Prior to Admission medications   Medication Sig Start Date End Date Taking? Authorizing Provider  acetaminophen (TYLENOL) 500 MG tablet Take 1,000 mg every 6 (six) hours as needed by mouth (FOR HEADACHES/MINOR DISCOMFORT).   Yes [provider]  ALPRAZolam (XANAX) 0.25 MG tablet Take 0.25 mg by mouth 2 (two) times  daily.    Yes [provider]  aluminum-magnesium hydroxide-simethicone (MAALOX) 200-200-20 MG/5ML SUSP Take 30 mLs by mouth 4 (four) times daily as needed (heartburn/indigestion).   Yes [provider]  amLODipine (NORVASC) 5 MG tablet Take 1 tablet (5 mg total) by mouth at bedtime. 02/28/19  Yes Emokpae, Courage, MD  anti-nausea (EMETROL) solution Take 30 mLs every 15 (fifteen) minutes as needed by mouth for nausea or vomiting (X4 doses only).   Yes [provider]  b complex vitamins capsule Take 1 capsule by mouth daily.    Yes [provider]  buPROPion (WELLBUTRIN) 75 MG tablet Take 75 mg by mouth daily.    Yes [provider]  carvedilol (COREG) 6.25 MG tablet Take 1 tablet (6.25 mg total) by mouth 2 (two) times a day. 02/28/19  Yes Emokpae, Courage, MD  cholecalciferol (VITAMIN D3) 25 MCG (1000 UT) tablet Take 1,000 Units by mouth daily.   Yes [provider]  fexofenadine (ALLEGRA) 180 MG tablet Take 180 mg by mouth daily.    Yes [provider]  furosemide (LASIX) 20 MG tablet Take 1 tablet (20 mg total) by mouth at bedtime as needed for fluid or edema (swelling). 02/28/19  Yes Emokpae, Courage, MD  furosemide (LASIX) 40 MG tablet Take 40 mg by mouth daily.    Yes [provider]  gabapentin (NEURONTIN) 300 MG capsule Take 600 mg by mouth at bedtime.    Yes [provider]  gabapentin (NEURONTIN) 300 MG capsule Take 300 mg by mouth See admin instructions. Take 300 mg daily at 6am and at 2pm.   Yes [provider]  glipiZIDE (GLUCOTROL) 5 MG tablet Take 1 tablet (5 mg total) by mouth 2 (two) times a day. 02/28/19  Yes Emokpae, Courage, MD  guaifenesin (ROBITUSSIN) 100 MG/5ML syrup Take 200 mg every 6 (six) hours as needed by mouth for cough.   Yes [provider]  guaiFENesin-codeine (ROBITUSSIN AC) 100-10 MG/5ML syrup Take 5 mLs by mouth every 6 (six) hours as needed for cough.   Yes [provider]  insulin glargine (LANTUS) 100 unit/mL SOPN Inject 25-55 Units into the skin See admin instructions. Inject 55 units every morning and 25 units every evening.   Yes [provider]  insulin lispro (HUMALOG) 100 UNIT/ML KiwkPen Inject 10 Units into the skin 3 (three) times daily before meals. *Do Not to inject for levels below 100   Yes [provider]  loperamide (IMODIUM A-D) 2 MG tablet Take 2 mg by mouth as needed for diarrhea or loose stools (up to 8 doses in 24 hrs.).    Yes [provider]  Melatonin 5 MG CAPS Take 10 mg by mouth at bedtime.    Yes [provider]  pantoprazole (PROTONIX) 40 MG tablet Take 1 tablet (40 mg total) by mouth 2 (two) times daily. 11/10/18  Yes Barton Dubois, MD  polyvinyl alcohol (LIQUIFILM TEARS) 1.4 % ophthalmic solution Place 1 drop into both eyes every 4 (four) hours as needed for dry eyes.    Yes [provider]  sertraline (ZOLOFT) 50 MG tablet Take 75 mg by mouth daily.    Yes [provider]  sevelamer carbonate (RENVELA) 800 MG tablet Take 1,600-2,400 mg by mouth See admin instructions. 2400 mg 3 times daily with meals and 1600 mg twice daily with snacks   Yes [provider]  traZODone (DESYREL) 50 MG tablet Take 25 mg by mouth at bedtime as needed for sleep.    Yes [provider]    Physical Exam: BP (!) 108/56 (BP Location: Left Arm)   Pulse 77   Temp 98.1 F (36.7 C) (Oral)   Resp 14   Ht '5\' 2"'  (1.575 m)   Wt 101.5 kg   SpO2 93%   BMI 40.93 kg/m   . General: Elderly female. Awake and alert and oriented x3. No acute cardiopulmonary distress.  Marland Kitchen HEENT: Normocephalic atraumatic.  Right and left ears normal in appearance.  Pupils equal, round, reactive to light. Extraocular muscles are intact. Sclerae anicteric and noninjected but pale.  Pale mucosal membranes. No mucosal lesions.  . Neck: Neck supple without lymphadenopathy. No carotid bruits. No masses  palpated.  . Cardiovascular: Regular rate with normal S1-S2 sounds.  Grade 2 out of 6 systolic ejection murmur with radiation into carotids. No rubs, gallops auscultated. No JVD.  Marland Kitchen Respiratory: Good respiratory effort with no wheezes, rales, rhonchi. Lungs  clear to auscultation bilaterally.  No accessory muscle use. . Abdomen: Soft, nontender, nondistended. Active bowel sounds. No masses or hepatosplenomegaly  . Skin: No rashes, lesions, or ulcerations.  Dry, warm to touch. 2+ dorsalis pedis and radial pulses. . Musculoskeletal: No calf or leg pain. All major joints not erythematous nontender.  No upper or lower joint deformation.  Good ROM.  No contractures  . Psychiatric: Intact judgment and insight. Pleasant and cooperative. . Neurologic: No focal neurological deficits. Strength is 5/5 and symmetric in upper and lower extremities.  Cranial nerves II through XII are grossly intact.           Labs on Admission: I have personally reviewed following labs and imaging studies  CBC: Recent Labs  Lab 05/04/19 1641  WBC 4.3  NEUTROABS 3.0  HGB 6.3*  HCT 19.8*  MCV 107.6*  PLT 704*   Basic Metabolic Panel: Recent Labs  Lab 05/04/19 1641  NA 138  K 3.3*  CL 99  CO2 30  GLUCOSE 115*  BUN 17  CREATININE 3.27*  CALCIUM 8.3*   GFR: Estimated Creatinine Clearance: 18.1 mL/min (A) (by C-G formula based on SCr of 3.27 mg/dL (H)). Liver Function Tests: Recent Labs  Lab 05/04/19 1641  AST 19  ALT 16  ALKPHOS 83  BILITOT 0.5  PROT 6.9  ALBUMIN 3.7   No results for input(s): LIPASE, AMYLASE in the last 168 hours. No results for input(s): AMMONIA in the last 168 hours. Coagulation Profile: No results for input(s): INR, PROTIME in the last 168 hours. Cardiac Enzymes: No results for input(s): CKTOTAL, CKMB, CKMBINDEX, TROPONINI in the last 168 hours. BNP (last 3 results) No results for input(s): PROBNP in the last 8760 hours. HbA1C: No results for input(s): HGBA1C in the last  72 hours. CBG: No results for input(s): GLUCAP in the last 168 hours. Lipid Profile: No results for input(s): CHOL, HDL, LDLCALC, TRIG, CHOLHDL, LDLDIRECT in the last 72 hours. Thyroid Function Tests: No results for input(s): TSH, T4TOTAL, FREET4, T3FREE, THYROIDAB in the last 72 hours. Anemia Panel: No results for input(s): VITAMINB12, FOLATE, FERRITIN, TIBC, IRON, RETICCTPCT in the last 72 hours. Urine analysis: No results found for: COLORURINE, APPEARANCEUR, LABSPEC, PHURINE, GLUCOSEU, HGBUR, BILIRUBINUR, KETONESUR, PROTEINUR, UROBILINOGEN, NITRITE, LEUKOCYTESUR Sepsis Labs: '@LABRCNTIP' (procalcitonin:4,lacticidven:4) )No results found for this or any previous visit (from the past 240 hour(s)).   Radiological Exams on Admission: Dg Chest Portable 1 View  Result Date: 05/04/2019 CLINICAL DATA:  Shortness of breath, cough and wheezing. EXAM: PORTABLE CHEST 1 VIEW COMPARISON:  01/11/2019 prior radiographs FINDINGS: Cardiomegaly and chronic peribronchial thickening again identified. There is no evidence of focal airspace disease, pulmonary edema, suspicious pulmonary nodule/mass, pleural effusion, or pneumothorax. No acute bony abnormalities are identified. IMPRESSION: Cardiomegaly without evidence of acute cardiopulmonary disease. Chronic peribronchial thickening. Electronically Signed   By: Margarette Canada M.D.   On: 05/04/2019 17:06    EKG: Independently reviewed.  Pending  Assessment/Plan: Principal Problem:   Symptomatic anemia Active Problems:   Type 2 diabetes with nephropathy (HCC)   Essential hypertension   GI bleed   Guaiac positive stools   ESRD (end stage renal disease) ([place])    This patient was discussed with the ED physician, including pertinent vitals, physical exam findings, labs, and imaging.  We also discussed care given by the ED provider.  1. Symptomatic anemia a. Admit b. Transfuse 2 more units c. We will give Lasix in between units d. Check CBC after transfusion  and in the morning  2. GI bleed a. Protonix b. Consult GI 3. Guaiac positive stools a.  4. Hypertension a. Continue home regimen 5. End-stage renal disease a. Patient had dialysis today 6. Type 2 diabetes a. Continue home regimen b. Sliding scale insulin  DVT prophylaxis: SCDs Consultants: GI Code Status: Full code Family Communication: None Disposition Plan: Patient should be able to return home tomorrow   Truett Mainland, DO

## 2019-05-04 NOTE — ED Provider Notes (Signed)
[facility] EMERGENCY DEPARTMENT Provider Note   CSN: [id number] Arrival date & time: 05/04/19  1545     History   Chief Complaint No chief complaint on file.   HPI Tanya Lucero is a 68 y.o. female.     Patient is a 68 year old female who presents to the emergency department because of a low hemoglobin.  The patient has a history of chronic kidney disease, requiring dialysis.  Diabetes mellitus, anemia, anxiety, decreased vision, GI bleed, neuropathy. Patient was told at dialysis that her hemoglobin was low.  She believes the number was 6.1, but she is not sure.  The patient says that she has had nausea, but no vomiting.  She says her eyesight is too poor to tell if she has had any changes in her stool or her urine.  She is not been vomiting.  She has had problems with anemia in the past and has required transfusions.  Patient says that she notices that she is little more short of breath than usual.  This usually occurs after walking and eating.  She also notes some mild to moderate shortness of breath at rest.  She presents now for evaluation of her hemoglobin and hematocrit and possible blood transfusion.  The history is provided by the patient.    Past Medical History:  Diagnosis Date  . Anemia   . Anxiety   . Arthritis   . Chronic kidney disease    neuphrotic syndrome-05/2013  . Constipation   . Decreased vision    left eye  . Depression   . Diabetes mellitus    Type 2  . Diabetic neuropathy ([place])   . Diverticulitis   . Dry skin   . GERD (gastroesophageal reflux disease)   . GI bleed 07/05/2017  . Gout   . Headache   . Hypertension   . Neuropathy   . Shortness of breath dyspnea   . Skin cancer     Patient Active Problem List   Diagnosis Date Noted  . 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 Heme positive stool 11/20/2018  . Symptomatic anemia 11/09/2018  . ESRD (end stage renal disease) ([place]) 11/09/2018  . Anxiety and depression 11/09/2018   . Secondary hyperparathyroidism of renal origin ([place]) 11/09/2018  . Diabetic neuropathy ([place]) 11/09/2018  . Guaiac positive stools 07/19/2017  . GI bleed 07/05/2017  . Iron deficiency anemia due to chronic blood loss 10/27/2016  . RENAL INSUFFICIENCY 03/04/2010  . Type 2 diabetes with nephropathy ([place]) 02/17/2010  . Essential hypertension 02/17/2010  . OTHER MALAISE AND FATIGUE 02/17/2010  . SHORTNESS OF BREATH 02/17/2010  . CHEST PAIN, PRECORDIAL 02/17/2010    Past Surgical History:  Procedure Laterality Date  . ABDOMINAL HYSTERECTOMY    . ADENOIDECTOMY    . APPENDECTOMY     taken out with hysterectomy  . AV FISTULA PLACEMENT Right 09/13/2014   Procedure: Creation of Right Arm RADIOCEPHALIC ARTERIOVENOUS (AV) FISTULA ;  Surgeon: [name], MD;  Location: [facility];  Service: Vascular;  Laterality: Right;  . BACK SURGERY     lumbar -   . CATARACT EXTRACTION W/PHACO Left 07/02/2013   Procedure: CATARACT EXTRACTION PHACO AND INTRAOCULAR LENS PLACEMENT (IOC);  Surgeon: [name], MD;  [facility]: [facility];  Service: Ophthalmology;  Laterality: Left;  CDE:  6.46  . CATARACT EXTRACTION W/PHACO Right 07/12/2013   Procedure: CATARACT EXTRACTION PHACO AND INTRAOCULAR LENS PLACEMENT (IOC);  Surgeon: [name], MD;  Location: [facility];  Service: Ophthalmology;  Laterality: Right;  CDE:12.48  . COLONOSCOPY N/A 12/11/2015   Procedure: COLONOSCOPY;  Surgeon: [name], MD;  [facility];  Service: Endoscopy;  Laterality: N/A;  1:55  . ESOPHAGOGASTRODUODENOSCOPY N/A 03/11/2016   Procedure: ESOPHAGOGASTRODUODENOSCOPY (EGD);  Surgeon: [name], MD;  [facility];  Service: Endoscopy;  Laterality: N/A;  1230 - Last pt of the day-Dr. Laural Golden going to office to see pt's  . ESOPHAGOGASTRODUODENOSCOPY N/A 07/21/2017   Procedure: ESOPHAGOGASTRODUODENOSCOPY (EGD);  Surgeon: [name], MD;  [facility];  Service: Endoscopy;  Laterality: N/A;  3:00  .  ESOPHAGOGASTRODUODENOSCOPY N/A 11/30/2018   Procedure: ESOPHAGOGASTRODUODENOSCOPY (EGD);  Surgeon: [name], MD;  [facility];  Service: Endoscopy;  Laterality: N/A;  9:55 - spoke with the facility and advised new arrival time 10:20  . ESOPHAGOGASTRODUODENOSCOPY N/A 01/11/2019   Procedure: ESOPHAGOGASTRODUODENOSCOPY (EGD);  Surgeon: [name], MD;  [facility];  Service: Endoscopy;  Laterality: N/A;  155 - office spoke with facility and they know to have pt here at 11:00  . FOOT SURGERY Right    x2-heel spur   . GIVENS CAPSULE STUDY N/A 11/09/2016   Procedure: GIVENS CAPSULE STUDY;  Surgeon: [name], MD;  [facility];  Service: Endoscopy;  Laterality: N/A;  7:30  . GIVENS CAPSULE STUDY N/A 11/10/2018   Procedure: GIVENS CAPSULE STUDY;  Surgeon: [name], MD;  [facility];  Service: Endoscopy;  Laterality: N/A;  . REVISON OF ARTERIOVENOUS FISTULA Right 01/05/2016   Procedure: REVISON OF ARTERIOVENOUS FISTULA- right forearm;  Surgeon: [name], MD;  Location: [place];  Service: Vascular;  Laterality: Right;  . SKIN CANCER EXCISION     forehead  . TONSILLECTOMY       OB History   No obstetric history on file.      Home Medications    Prior to Admission medications   Medication Sig Start Date End Date Taking? Authorizing Provider  acetaminophen (TYLENOL) 500 MG tablet Take 1,000 mg every 6 (six) hours as needed by mouth (FOR HEADACHES/MINOR DISCOMFORT).    [provider]  ALPRAZolam Duanne Moron) 0.25 MG tablet Take 0.25 mg by mouth 2 (two) times daily.     [provider]  aluminum-magnesium hydroxide-simethicone (MAALOX) 200-200-20 MG/5ML SUSP Take 30 mLs by mouth 4 (four) times daily as needed (heartburn/indigestion).    [provider]  amLODipine (NORVASC) 5 MG tablet Take 1 tablet (5 mg total) by mouth at bedtime. 02/28/19   Roxan Hockey, MD  anti-nausea (EMETROL) solution Take 30 mLs  every 15 (fifteen) minutes as needed by mouth for nausea or vomiting (X4 doses only).    [provider]  b complex vitamins capsule Take 1 capsule by mouth daily.     [provider]  buPROPion (WELLBUTRIN) 75 MG tablet Take 75 mg by mouth daily.     [provider]  carvedilol (COREG) 6.25 MG tablet Take 1 tablet (6.25 mg total) by mouth 2 (two) times a day. 02/28/19   Roxan Hockey, MD  cholecalciferol (VITAMIN D3) 25 MCG (1000 UT) tablet Take 1,000 Units by mouth daily.    [provider]  fexofenadine (ALLEGRA) 180 MG tablet Take 180 mg by mouth daily.     [provider]  furosemide (LASIX) 20 MG tablet Take 1 tablet (20 mg total) by mouth at bedtime as needed for fluid or edema (swelling). 02/28/19   Emokpae, Courage,  MD  furosemide (LASIX) 40 MG tablet Take 40 mg by mouth daily.     [provider]  gabapentin (NEURONTIN) 300 MG capsule Take 300 mg by mouth See admin instructions. Take 300 mg in the morning and 371m at 2pm    [provider]  glipiZIDE (GLUCOTROL) 5 MG tablet Take 1 tablet (5 mg total) by mouth 2 (two) times a day. 02/28/19   ERoxan Hockey MD  guaifenesin (ROBITUSSIN) 100 MG/5ML syrup Take 200 mg every 6 (six) hours as needed by mouth for cough.    [provider]  guaiFENesin-codeine (ROBITUSSIN AC) 100-10 MG/5ML syrup Take 5 mLs by mouth every 6 (six) hours as needed for cough.    [provider]  insulin glargine (LANTUS) 100 unit/mL SOPN Inject 25-55 Units into the skin See admin instructions. 55 units in the morning    [provider]  insulin lispro (HUMALOG) 100 UNIT/ML KiwkPen Inject 10 Units into the skin 3 (three) times daily before meals. *Do Not to inject for levels below 100    [provider]  loperamide (IMODIUM A-D) 2 MG tablet Take 2 mg by mouth as needed for diarrhea or loose stools (up to 8 doses in 24 hrs.).     [provider]  Melatonin 5 MG  CAPS Take 10 mg by mouth at bedtime.     [provider]  pantoprazole (PROTONIX) 40 MG tablet Take 1 tablet (40 mg total) by mouth 2 (two) times daily. 11/10/18   MBarton Dubois MD  polyvinyl alcohol (LIQUIFILM TEARS) 1.4 % ophthalmic solution Place 1 drop into both eyes every 4 (four) hours as needed for dry eyes.     [provider]  sertraline (ZOLOFT) 50 MG tablet Take 75 mg by mouth daily.     [provider]  sevelamer carbonate (RENVELA) 800 MG tablet Take 1,600-2,400 mg by mouth See admin instructions. 2400 mg 3 times daily with meals and 1600 mg twice daily with snacks    [provider]  traZODone (DESYREL) 50 MG tablet Take 25 mg by mouth at bedtime.    [provider]    Family History Family History  Problem Relation Age of Onset  . Multiple myeloma Mother   . Diabetes Mother   . Cirrhosis Mother   . Heart attack Father 863 . Colon cancer Neg Hx     Social History Social History   Tobacco Use  . Smoking status: Current Every Day Smoker    Packs/day: 0.25    Years: 40.00    Pack years: 10.00    Types: Cigarettes  . Smokeless tobacco: Never Used  Substance Use Topics  . Alcohol use: No    Alcohol/week: 0.0 standard drinks  . Drug use: No     Allergies   Penicillins   Review of Systems Review of Systems  Constitutional: Positive for fatigue. Negative for activity change and appetite change.  HENT: Negative for congestion, ear discharge, ear pain, facial swelling, nosebleeds, rhinorrhea, sneezing and tinnitus.   Eyes: Negative for photophobia, pain and discharge.  Respiratory: Positive for shortness of breath. Negative for cough, choking and wheezing.   Cardiovascular: Negative for chest pain, palpitations and leg swelling.  Gastrointestinal: Positive for nausea. Negative for abdominal pain, blood in stool, constipation, diarrhea and vomiting.  Genitourinary: Negative for difficulty urinating, dysuria, flank pain,  frequency and hematuria.  Musculoskeletal: Negative for back pain, gait problem, myalgias and neck pain.  Skin: Negative for color change,  rash and wound.  Neurological: Positive for numbness. Negative for dizziness, seizures, syncope, facial asymmetry, speech difficulty and weakness.  Hematological: Negative for adenopathy. Does not bruise/bleed easily.  Psychiatric/Behavioral: Negative for agitation, confusion, hallucinations, self-injury and suicidal ideas. The patient is not nervous/anxious.      Physical Exam Updated Vital Signs There were no vitals taken for this visit.  Physical Exam Vitals signs and nursing note reviewed.  Constitutional:      Appearance: She is well-developed. She is not toxic-appearing.  HENT:     Head: Normocephalic.     Right Ear: Tympanic membrane and external ear normal.     Left Ear: Tympanic membrane and external ear normal.  Eyes:     General: Lids are normal.     Pupils: Pupils are equal, round, and reactive to light.  Neck:     Musculoskeletal: Normal range of motion and neck supple.     Vascular: No carotid bruit.  Cardiovascular:     Rate and Rhythm: Normal rate and regular rhythm.     Pulses: Normal pulses.     Heart sounds: Normal heart sounds.  Pulmonary:     Effort: Tachypnea present. No respiratory distress.     Breath sounds: Wheezing present.  Abdominal:     General: Bowel sounds are normal.     Palpations: Abdomen is soft.     Tenderness: There is no abdominal tenderness. There is no guarding.  Musculoskeletal: Normal range of motion.  Lymphadenopathy:     Head:     Right side of head: No submandibular adenopathy.     Left side of head: No submandibular adenopathy.     Cervical: No cervical adenopathy.  Skin:    General: Skin is warm and dry.     Coloration: Skin is pale.  Neurological:     Mental Status: She is alert and oriented to person, place, and time.     Cranial Nerves: No cranial nerve deficit.     Sensory: No  sensory deficit.  Psychiatric:        Speech: Speech normal.      ED Treatments / Results  Labs (all labs ordered are listed, but only abnormal results are displayed) Labs Reviewed - No data to display  EKG None  Radiology No results found.  Procedures Procedures (including critical care time) CRITICAL CARE Performed by: Lily Kocher Total critical care time: *30 minutes Critical care time was exclusive of separately billable procedures and treating other patients. Critical care was necessary to treat or prevent imminent or life-threatening deterioration. Hgb 6.3 Critical care was time spent personally by me on the following activities: development of treatment plan with patient and/or surrogate as well as nursing, discussions with consultants, evaluation of patient's response to treatment, examination of patient, obtaining history from patient or surrogate, ordering and performing treatments and interventions, ordering and review of laboratory studies, ordering and review of radiographic studies, pulse oximetry and re-evaluation of patient's condition.  Medications Ordered in ED Medications - No data to display   Initial Impression / Assessment and Plan / ED Course  I have reviewed the triage vital signs and the nursing notes.  Pertinent labs & imaging results that were available during my care of the patient were reviewed by me and considered in my medical decision making (see chart for details).          Final Clinical Impressions(s) / ED Diagnoses MDM  The blood pressure is low at 93/39 on admission.  The pulse  oximetry is 92% on room air.  Respiratory rate is 22. Pt reports increasing SOB mostly with walking.  Chest x-ray shows cardio cardiomegaly without evidence of acute cardiopulmonary disease.  There is chronic peribronchial thickening.  Patient treated with albuterol inhaler.  The hemoglobin measured 6.3 today.  Hematocrit down to 19.8.  Patient typed and  crossed for packed cells.  We will transfuse.  The comprehensive metabolic panel shows the potassium to be slightly low at 3.3, the BUN is 17 and the creatinine is 3.27.  The patient had a full dialysis session today.  Hepatic function studies are within normal limits.  Patient is noted to be O Pos. Stools positive for occult blood.  Lab reports antibodies present while typing crossing the patient.  Blood will have to be obtained from the [facility] long [facility].  We will discuss case with the hospitalist for admission.   Final diagnoses:  Symptomatic anemia  Gastrointestinal hemorrhage, unspecified gastrointestinal hemorrhage type    ED Discharge Orders    None       Lily Kocher, Hershal Coria 05/04/19 1919    Noemi Chapel, MD 05/04/19 2156

## 2019-05-04 NOTE — ED Provider Notes (Signed)
Medical screening examination/treatment/procedure(s) were conducted as a shared visit with non-physician practitioner(s) and myself.  I personally evaluated the patient during the encounter.  Clinical Impression:   Final diagnoses:  Symptomatic anemia  Gastrointestinal hemorrhage, unspecified gastrointestinal hemorrhage type   This patient is a 68 year old female who presents from dialysis after receiving her whole session with anemia, on exam she does appear very pale and thick mucous membranes, conjunctive a in the nailbeds.  She has no tachycardia but has a very soft blood pressure measuring initially in the 30Q systolic.  IV fluids will be given, she will be typed and crossed and likely transfusion as she reports a hemoglobin around 6.  Review of the medical record shows recent transfusions over the last couple of months.  Anticipate admission for transfusion.  She is known to have a slow gastrointestinal bleed however she did have a upper endoscopy that was performed in May that did not show any specific signs of upper GI bleeding  This patient has Hemoccult positive stool, she has a severe anemia less than 7, she has chronic kidney damage, and unfortunately she will need to have special blood requested from another hospital since she has antibodies.  The patient will need to be admitted to the hospital, she is critically ill with what appears to be some hypotension and severe anemia.  Critical care provided.  .Critical Care Performed by: Noemi Chapel, MD Authorized by: Noemi Chapel, MD   Critical care provider statement:    Critical care time (minutes):  35   Critical care time was exclusive of:  Separately billable procedures and treating other patients and teaching time   Critical care was necessary to treat or prevent imminent or life-threatening deterioration of the following conditions: severe anemia.   Critical care was time spent personally by me on the following activities:   Blood draw for specimens, development of treatment plan with patient or surrogate, discussions with consultants, evaluation of patient's response to treatment, examination of patient, obtaining history from patient or surrogate, ordering and performing treatments and interventions, ordering and review of laboratory studies, ordering and review of radiographic studies, pulse oximetry, re-evaluation of patient's condition and review of old charts      Noemi Chapel, MD 05/04/19 2156

## 2019-05-05 ENCOUNTER — Other Ambulatory Visit: Payer: Self-pay

## 2019-05-05 ENCOUNTER — Inpatient Hospital Stay (HOSPITAL_COMMUNITY): Payer: Medicare Other

## 2019-05-05 DIAGNOSIS — E1121 Type 2 diabetes mellitus with diabetic nephropathy: Secondary | ICD-10-CM

## 2019-05-05 DIAGNOSIS — K31819 Angiodysplasia of stomach and duodenum without bleeding: Secondary | ICD-10-CM

## 2019-05-05 DIAGNOSIS — I10 Essential (primary) hypertension: Secondary | ICD-10-CM

## 2019-05-05 DIAGNOSIS — D649 Anemia, unspecified: Secondary | ICD-10-CM

## 2019-05-05 DIAGNOSIS — D500 Iron deficiency anemia secondary to blood loss (chronic): Secondary | ICD-10-CM

## 2019-05-05 DIAGNOSIS — R195 Other fecal abnormalities: Secondary | ICD-10-CM

## 2019-05-05 DIAGNOSIS — N186 End stage renal disease: Secondary | ICD-10-CM

## 2019-05-05 DIAGNOSIS — N2581 Secondary hyperparathyroidism of renal origin: Secondary | ICD-10-CM

## 2019-05-05 LAB — CBC
HCT: 20.6 % — ABNORMAL LOW (ref 36.0–46.0)
Hemoglobin: 6.3 g/dL — CL (ref 12.0–15.0)
MCH: 33.5 pg (ref 26.0–34.0)
MCHC: 30.6 g/dL (ref 30.0–36.0)
MCV: 109.6 fL — ABNORMAL HIGH (ref 80.0–100.0)
Platelets: 131 10*3/uL — ABNORMAL LOW (ref 150–400)
RBC: 1.88 MIL/uL — ABNORMAL LOW (ref 3.87–5.11)
RDW: 18.4 % — ABNORMAL HIGH (ref 11.5–15.5)
WBC: 3.9 10*3/uL — ABNORMAL LOW (ref 4.0–10.5)
nRBC: 0 % (ref 0.0–0.2)

## 2019-05-05 LAB — GLUCOSE, CAPILLARY
Glucose-Capillary: 106 mg/dL — ABNORMAL HIGH (ref 70–99)
Glucose-Capillary: 132 mg/dL — ABNORMAL HIGH (ref 70–99)

## 2019-05-05 LAB — CBG MONITORING, ED
Glucose-Capillary: 77 mg/dL (ref 70–99)
Glucose-Capillary: 186 mg/dL — ABNORMAL HIGH (ref 70–99)

## 2019-05-05 LAB — MRSA PCR SCREENING: MRSA by PCR: NEGATIVE

## 2019-05-05 MED ORDER — SEVELAMER CARBONATE 800 MG PO TABS
1600.0000 mg | ORAL_TABLET | ORAL | Status: DC
  Administered 2019-05-05: 11:00:00 1600 mg via ORAL
  Filled 2019-05-05 (×8): qty 2

## 2019-05-05 MED ORDER — TECHNETIUM TC 99M-LABELED RED BLOOD CELLS IV KIT
25.0000 | PACK | Freq: Once | INTRAVENOUS | Status: AC | PRN
  Administered 2019-05-05: 19:00:00 23.9 via INTRAVENOUS

## 2019-05-05 MED ORDER — INSULIN GLARGINE 100 UNIT/ML ~~LOC~~ SOLN
55.0000 [IU] | Freq: Every morning | SUBCUTANEOUS | Status: DC
  Filled 2019-05-05 (×3): qty 0.55

## 2019-05-05 MED ORDER — GABAPENTIN 300 MG PO CAPS
300.0000 mg | ORAL_CAPSULE | ORAL | Status: DC
  Administered 2019-05-06 (×2): 300 mg via ORAL
  Filled 2019-05-05 (×2): qty 1

## 2019-05-05 MED ORDER — INSULIN GLARGINE 100 UNIT/ML ~~LOC~~ SOLN
25.0000 [IU] | Freq: Every evening | SUBCUTANEOUS | Status: DC
  Filled 2019-05-05 (×3): qty 0.25

## 2019-05-05 MED ORDER — HEPARIN SOD (PORK) LOCK FLUSH 100 UNIT/ML IV SOLN
INTRAVENOUS | Status: AC
  Filled 2019-05-05: qty 5

## 2019-05-05 MED ORDER — ACETAMINOPHEN 325 MG PO TABS
650.0000 mg | ORAL_TABLET | Freq: Once | ORAL | Status: AC
  Administered 2019-05-05: 08:00:00 650 mg via ORAL
  Filled 2019-05-05: qty 2

## 2019-05-05 MED ORDER — INSULIN GLARGINE 100 UNIT/ML ~~LOC~~ SOLN
30.0000 [IU] | Freq: Every morning | SUBCUTANEOUS | Status: DC
  Administered 2019-05-05 – 2019-05-06 (×2): 30 [IU] via SUBCUTANEOUS
  Filled 2019-05-05 (×3): qty 0.3

## 2019-05-05 MED ORDER — ACETAMINOPHEN 325 MG PO TABS
650.0000 mg | ORAL_TABLET | Freq: Four times a day (QID) | ORAL | Status: DC | PRN
  Administered 2019-05-05 – 2019-05-06 (×2): 650 mg via ORAL
  Filled 2019-05-05 (×2): qty 2

## 2019-05-05 NOTE — ED Notes (Signed)
Multiple nurses attempted IV assess and was successful at 0938 today. IV from previous shift was malpositioned and infiltrated when flushed this am.

## 2019-05-05 NOTE — ED Notes (Signed)
Consent for blood completed at this time at bedside.

## 2019-05-05 NOTE — ED Notes (Signed)
CRITICAL VALUE ALERT  Critical Value:  Hgb 6.3  Date & Time Notied:  05/05/2019, 0715  Provider Notified: dr. tat  Orders Received/Actions taken: no new orders

## 2019-05-05 NOTE — ED Notes (Signed)
Pt requesting tyl for back.

## 2019-05-05 NOTE — ED Notes (Signed)
Blood unit that was released from the Lab will not scan and saying unit does not match criteria. Unit returned to lab and lab personnel to call this nurse when blood is available to administer.

## 2019-05-05 NOTE — Progress Notes (Signed)
PROGRESS NOTE  Tanya Lucero ZGY:[id number] DOB: [birth year] DOA: 05/04/2019 PCP: Scotty Court, DO  Brief History:  68 year old female with a history of GAVE, diabetes mellitus type 2, anxiety, peripheral neuropathy, ESRD (MWF), hypertension presenting from her dialysis unit with low hemoglobin.  Patient states that she had routine blood work performed at her dialysis center on 03/04/2019.  She stated that the hemoglobin was noted to be 6.1.  After she had the entire session of dialysis, she was told to report to the emergency department for further evaluation.  The patient has complained of shortness of breath and dyspnea on exertion for the better part of 1 to 2 months.  She has had some intermittent dizziness when standing.  However, she denies any fevers, chills, chest pain, nausea, vomiting, diarrhea, abdominal pain, dysuria.  The patient has poor vision and is unable to tell me if she has had any hematochezia or melena.  The patient was admitted to the hospital from 02/24/2019 through 02/28/2019 for symptomatic anemia.  She was transfused 2 units PRBC.  GI recommended conservative therapy at that time.  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  This was treated with APC.  She also had a previous EGD on 11/30/2018 which showed similar findings. In the emergency department, the patient was afebrile hemodynamically stable saturating 96% on room air.  BMP showed a potassium 3.3 with serum creatinine 3.27.  Hemoglobin was 6.3 with platelets 1 43,000.  Chest x-ray showed chronic peribronchial thickening.  Patient was given 1 unit PRBC, and additional 2 units were ordered.  GI was consulted to assist with management.  Assessment/Plan: Symptomatic Anemia/heme positive stool -Transfuse 2 additional units PRBC as discussed -GI consult -Start PPI -Check iron studies -Check coags -clear liquid diet  for now  Heme positive stool/GAVE -GI consult -PPI as discussed  ESRD -Patient had full dialysis on 05/04/2019 -Consult nephrology if the patient stays beyond 05/06/2019 -Continue Renvela  Essential hypertension -Continue carvedilol -Holding amlodipine and monitor clinically  Diabetes mellitus type 2 -Check hemoglobin A1c -NovoLog sliding scale -Start reduced dose Lantus  Depression/anxiety -Continue home dose Wellbutrin, alprazolam, Zoloft      Disposition Plan:   ALF in 1-2 days  Family Communication:  No Family at bedside  Consultants:  GI  Code Status:  FULL  DVT Prophylaxis:  SCDs   Procedures: As Listed in Progress Note Above  Antibiotics: None       Subjective: Patient denies fevers, chills, headache, chest pain, dyspnea, nausea, vomiting, diarrhea, abdominal pain, dysuria, hematuria, hematochezia, and melena.   Objective: Vitals:   05/04/19 2245 05/04/19 2300 05/04/19 2315 05/05/19 0800  BP:  (!) 132/55  (!) 142/56  Pulse:    88  Resp: 16 18 16 18   Temp:    97.8 F (36.6 C)  TempSrc:    Oral  SpO2:    96%  Weight:      Height:       No intake or output data in the 24 hours ending 05/05/19 0845 Weight change:  Exam:   General:  Pt is alert, follows commands appropriately, not in acute distress  HEENT: No icterus, No thrush, No neck mass, [place]/AT  Cardiovascular: RRR, S1/S2, no rubs, no gallops  Respiratory: CTA bilaterally, no wheezing, no crackles, no rhonchi  Abdomen: Soft/+BS, non tender, non distended, no guarding  Extremities: No edema,  No lymphangitis, No petechiae, No rashes, no synovitis   Data Reviewed: I have personally reviewed following labs and imaging studies Basic Metabolic Panel: Recent Labs  Lab 05/04/19 1641  NA 138  K 3.3*  CL 99  CO2 30  GLUCOSE 115*  BUN 17  CREATININE 3.27*  CALCIUM 8.3*   Liver Function Tests: Recent Labs  Lab 05/04/19 1641  AST 19  ALT 16  ALKPHOS 83  BILITOT 0.5  PROT  6.9  ALBUMIN 3.7   No results for input(s): LIPASE, AMYLASE in the last 168 hours. No results for input(s): AMMONIA in the last 168 hours. Coagulation Profile: No results for input(s): INR, PROTIME in the last 168 hours. CBC: Recent Labs  Lab 05/04/19 1641 05/05/19 0532  WBC 4.3 3.9*  NEUTROABS 3.0  --   HGB 6.3* 6.3*  HCT 19.8* 20.6*  MCV 107.6* 109.6*  PLT 143* 131*   Cardiac Enzymes: No results for input(s): CKTOTAL, CKMB, CKMBINDEX, TROPONINI in the last 168 hours. BNP: Invalid input(s): POCBNP CBG: Recent Labs  Lab 05/04/19 2317 05/05/19 0759  GLUCAP 209* 77   HbA1C: No results for input(s): HGBA1C in the last 72 hours. Urine analysis: No results found for: COLORURINE, APPEARANCEUR, LABSPEC, PHURINE, GLUCOSEU, HGBUR, BILIRUBINUR, KETONESUR, PROTEINUR, UROBILINOGEN, NITRITE, LEUKOCYTESUR Sepsis Labs: @LABRCNTIP (procalcitonin:4,lacticidven:4) ) Recent Results (from the past 240 hour(s))  SARS Coronavirus 2 [facility] order, Performed in [facility] hospital lab) Nasopharyngeal Nasopharyngeal Swab     Status: None   Collection Time: 05/04/19  7:00 PM   Specimen: Nasopharyngeal Swab  Result Value Ref Range Status   SARS Coronavirus 2 NEGATIVE NEGATIVE Final    Comment: (NOTE) If result is NEGATIVE SARS-CoV-2 target nucleic acids are NOT DETECTED. The SARS-CoV-2 RNA is generally detectable in upper and lower  respiratory specimens during the acute phase of infection. The lowest  concentration of SARS-CoV-2 viral copies this assay can detect is 250  copies / mL. A negative result does not preclude SARS-CoV-2 infection  and should not be used as the sole basis for treatment or other  patient management decisions.  A negative result may occur with  improper specimen collection / handling, submission of specimen other  than nasopharyngeal swab, presence of viral mutation(s) within the  areas targeted by this assay, and inadequate number of viral copies  (<250 copies  / mL). A negative result must be combined with clinical  observations, patient history, and epidemiological information. If result is POSITIVE SARS-CoV-2 target nucleic acids are DETECTED. The SARS-CoV-2 RNA is generally detectable in upper and lower  respiratory specimens dur ing the acute phase of infection.  Positive  results are indicative of active infection with SARS-CoV-2.  Clinical  correlation with patient history and other diagnostic information is  necessary to determine patient infection status.  Positive results do  not rule out bacterial infection or co-infection with other viruses. If result is PRESUMPTIVE POSTIVE SARS-CoV-2 nucleic acids MAY BE PRESENT.   A presumptive positive result was obtained on the submitted specimen  and confirmed on repeat testing.  While 2019 novel coronavirus  (SARS-CoV-2) nucleic acids may be present in the submitted sample  additional confirmatory testing may be necessary for epidemiological  and / or clinical management purposes  to differentiate between  SARS-CoV-2 and other Sarbecovirus currently known to infect humans.  If clinically indicated additional testing with an alternate test  methodology [phone]) is advised. The SARS-CoV-2 RNA is generally  detectable in upper and lower respiratory sp ecimens during the acute  phase of infection. The expected result is Negative. Fact Sheet for Patients:  StrictlyIdeas.no Fact Sheet for Healthcare Providers: BankingDealers.co.za This test is not yet approved or cleared by the [place] FDA and has been authorized for detection and/or diagnosis of SARS-CoV-2 by FDA under an Emergency Use Authorization (EUA).  This EUA will remain in effect (meaning this test can be used) for the duration of the COVID-19 declaration under Section 564(b)(1) of the Act, 21 U.S.C. section 360bbb-3(b)(1), unless the authorization is terminated or revoked sooner.  Performed at [facility], [address] [place], [place] [address]      Scheduled Meds: . sodium chloride   Intravenous Once  . ALPRAZolam  0.25 mg Oral BID  . buPROPion  75 mg Oral Daily  . carvedilol  6.25 mg Oral BID WC  . furosemide  40 mg Oral Daily  . gabapentin  300 mg Oral See admin instructions  . gabapentin  600 mg Oral QHS  . insulin aspart  0-15 Units Subcutaneous TID WC  . insulin aspart  0-5 Units Subcutaneous QHS  . insulin glargine  30 Units Subcutaneous q morning - 10a  . pantoprazole (PROTONIX) IV  40 mg Intravenous Q12H  . sertraline  75 mg Oral Daily  . sevelamer carbonate  1,600 mg Oral With snacks  . sevelamer carbonate  2,400 mg Oral TID WC   Continuous Infusions:  Procedures/Studies: Dg Chest Portable 1 View  Result Date: 05/04/2019 CLINICAL DATA:  Shortness of breath, cough and wheezing. EXAM: PORTABLE CHEST 1 VIEW COMPARISON:  01/11/2019 prior radiographs FINDINGS: Cardiomegaly and chronic peribronchial thickening again identified. There is no evidence of focal airspace disease, pulmonary edema, suspicious pulmonary nodule/mass, pleural effusion, or pneumothorax. No acute bony abnormalities are identified. IMPRESSION: Cardiomegaly without evidence of acute cardiopulmonary disease. Chronic peribronchial thickening. Electronically Signed   By: Margarette Canada M.D.   On: 05/04/2019 17:06    Orson Eva, DO  [facility] Pager [phone]  If 7PM-7AM, please contact night-coverage [email or web address] Password [email or web address] 05/05/2019, 8:45 AM   LOS: 1 day

## 2019-05-05 NOTE — ED Notes (Signed)
Called lab to check on blood. Still not arrived to aph.

## 2019-05-05 NOTE — Consult Note (Signed)
Referring Provider: Orson Eva, DO Primary Care Physician:  Scotty Court, DO Primary Gastroenterologist:  Dr. Laural Golden  Reason for Consultation:  Anemia and heme positive stool.  HPI:   Patient is 68 year old Caucasian female with end-stage renal disease on hemodialysis diabetes mellitus obesity peripheral neuropathy who was noted to have low hemoglobin when she had dialysis 4 days ago.  Her hemoglobin repeated yesterday at the time of dialysis and her hemoglobin was low.  She was therefore advised to come to emergency room.  Her hemoglobin was 6.3 g.  Her stool was guaiac positive.  Patient states she did not feel any different.  At times she has postural lightheadedness but nothing out of the ordinary.  She has diarrhea maybe once a week and other times her stool is formed or hard.  No history of melena or rectal bleeding but she says her vision is poor.  She remains with good appetite.  She denies abdominal pain or dysphagia.  She has intermittent heartburn for which she has to take OTC antacid.  She is on omeprazole.  She does not take aspirin or anticoagulants. Her last transfusion was in mid July. Patient has been evaluated for GI bleed and iron deficiency anemia and her prior evaluation is summarized below. Patient lives at [facility] in [place].  She is divorced.  She has never smoked cigarettes and does not drink alcohol.  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  She is now retired. She does not have any children. Her father lived to be in his 60s and mother died at 29. She has 1 sister and 3 brothers living.     Prior GI work-up has been as follows.  12/11/2015  Colonoscopy revealed sigmoid diverticulosis and 2 colonic adenomas.  2 other polyps were non-adenomatous.  03/11/2016  Esophagogastroduodenoscopy revealed antral mucosal nodularity.  Biopsy revealed hyperplastic polyp.  Of bleed.  Duodenal  pigmentation.  Biopsy revealed iron pigment/hemosiderin.  07/21/2017  Esophagogastroduodenoscopy revealed small esophageal squamous papilloma, diminutive gastric polyps portal hypertensive gastropathy and mild gave without bleeding.  11/10/2018  Small bowel given capsule study revealed gastritis with specks of blood and coffee-ground material coating the gastric mucosa.  Once again duodenal iron pigmentation noted.  11/30/2018  Esophagogastroduodenoscopy revealed gave without bleeding.  More than 50% the lesions were treated with APC.  Small gastric polyp noted.  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  Once again there was no spontaneous bleeding from these lesions.  Past Medical History:  Diagnosis Date  . Anemia   . Anxiety   . Arthritis   . Chronic kidney disease    neuphrotic syndrome-05/2013  . Constipation   . Decreased vision    left eye  . Depression   . Diabetes mellitus    Type 2  . Diabetic neuropathy ([place])   . Diverticulitis   . Dry skin   . GERD (gastroesophageal reflux disease)   . GI bleed 07/05/2017  . Gout   . Headache   . Hypertension   . Neuropathy   . Shortness of breath dyspnea   . Skin cancer     Past Surgical History:  Procedure Laterality Date  . ABDOMINAL HYSTERECTOMY    . ADENOIDECTOMY    . APPENDECTOMY     taken out with hysterectomy  . AV FISTULA PLACEMENT Right 09/13/2014   Procedure: Creation of Right Arm RADIOCEPHALIC ARTERIOVENOUS (AV) FISTULA ;  Surgeon: [name], MD;  Location: [facility];  Service: Vascular;  Laterality: Right;  . BACK SURGERY     lumbar -   . CATARACT EXTRACTION W/PHACO Left 07/02/2013   Procedure: CATARACT EXTRACTION PHACO AND INTRAOCULAR LENS PLACEMENT (IOC);  Surgeon: [name], MD;  [facility]: [facility];  Service: Ophthalmology;  Laterality: Left;  CDE:  6.46  . CATARACT EXTRACTION W/PHACO Right 07/12/2013   Procedure: CATARACT EXTRACTION PHACO AND INTRAOCULAR LENS  PLACEMENT (IOC);  Surgeon: [name], MD;  Location: [facility];  Service: Ophthalmology;  Laterality: Right;  CDE:12.48  . COLONOSCOPY N/A 12/11/2015   Procedure: COLONOSCOPY;  Surgeon: [name], MD;  [facility];  Service: Endoscopy;  Laterality: N/A;  1:55  . ESOPHAGOGASTRODUODENOSCOPY N/A 03/11/2016   Procedure: ESOPHAGOGASTRODUODENOSCOPY (EGD);  Surgeon: [name], MD;  [facility];  Service: Endoscopy;  Laterality: N/A;  1230 - Last pt of the day-Dr. Laural Golden going to office to see pt's  . ESOPHAGOGASTRODUODENOSCOPY N/A 07/21/2017   Procedure: ESOPHAGOGASTRODUODENOSCOPY (EGD);  Surgeon: [name], MD;  [facility];  Service: Endoscopy;  Laterality: N/A;  3:00  . ESOPHAGOGASTRODUODENOSCOPY N/A 11/30/2018   Procedure: ESOPHAGOGASTRODUODENOSCOPY (EGD);  Surgeon: [name], MD;  [facility];  Service: Endoscopy;  Laterality: N/A;  9:55 - spoke with the facility and advised new arrival time 10:20  . ESOPHAGOGASTRODUODENOSCOPY N/A 01/11/2019   Procedure: ESOPHAGOGASTRODUODENOSCOPY (EGD);  Surgeon: [name], MD;  [facility];  Service: Endoscopy;  Laterality: N/A;  155 - office spoke with facility and they know to have pt here at 11:00  . FOOT SURGERY Right    x2-heel spur   . GIVENS CAPSULE STUDY N/A 11/09/2016   Procedure: GIVENS CAPSULE STUDY;  Surgeon: [name], MD;  [facility];  Service: Endoscopy;  Laterality: N/A;  7:30  . GIVENS CAPSULE STUDY N/A 11/10/2018   Procedure: GIVENS CAPSULE STUDY;  Surgeon: [name], MD;  [facility];  Service: Endoscopy;  Laterality: N/A;  . REVISON OF ARTERIOVENOUS FISTULA Right 01/05/2016   Procedure: REVISON OF ARTERIOVENOUS FISTULA- right forearm;  Surgeon: [name], MD;  Location: [facility];  Service: Vascular;  Laterality: Right;  . SKIN CANCER EXCISION     forehead  . TONSILLECTOMY      Prior to Admission medications   Medication Sig  Start Date End Date Taking? Authorizing Provider  acetaminophen (TYLENOL) 500 MG tablet Take 1,000 mg every 6 (six) hours as needed by mouth (FOR HEADACHES/MINOR DISCOMFORT).   Yes [provider]  ALPRAZolam (XANAX) 0.25 MG tablet Take 0.25 mg by mouth 2 (two) times daily.    Yes [provider]  aluminum-magnesium hydroxide-simethicone (MAALOX) 200-200-20 MG/5ML SUSP Take 30 mLs by mouth 4 (four) times daily as needed (heartburn/indigestion).   Yes [provider]  amLODipine (NORVASC) 5 MG tablet Take 1 tablet (5 mg total) by mouth at bedtime. 02/28/19  Yes Emokpae, Courage, MD  anti-nausea (EMETROL) solution Take 30 mLs every 15 (fifteen) minutes as needed by mouth for nausea or vomiting (X4 doses only).   Yes [provider]  b complex vitamins capsule Take 1 capsule by mouth daily.    Yes [provider]  buPROPion (WELLBUTRIN) 75 MG tablet Take 75 mg by mouth daily.    Yes [provider]  carvedilol (COREG) 6.25 MG tablet Take 1 tablet (6.25 mg total) by mouth 2 (two) times a day.  02/28/19  Yes Emokpae, Courage, MD  cholecalciferol (VITAMIN D3) 25 MCG (1000 UT) tablet Take 1,000 Units by mouth daily.   Yes [provider]  fexofenadine (ALLEGRA) 180 MG tablet Take 180 mg by mouth daily.    Yes [provider]  furosemide (LASIX) 20 MG tablet Take 1 tablet (20 mg total) by mouth at bedtime as needed for fluid or edema (swelling). 02/28/19  Yes Emokpae, Courage, MD  furosemide (LASIX) 40 MG tablet Take 40 mg by mouth daily.    Yes [provider]  gabapentin (NEURONTIN) 300 MG capsule Take 600 mg by mouth at bedtime.    Yes [provider]  gabapentin (NEURONTIN) 300 MG capsule Take 300 mg by mouth See admin instructions. Take 300 mg daily at 6am and at 2pm.   Yes [provider]  glipiZIDE (GLUCOTROL) 5 MG tablet Take 1 tablet (5 mg total) by mouth 2 (two) times a day. 02/28/19  Yes Emokpae, Courage,  MD  guaifenesin (ROBITUSSIN) 100 MG/5ML syrup Take 200 mg every 6 (six) hours as needed by mouth for cough.   Yes [provider]  guaiFENesin-codeine (ROBITUSSIN AC) 100-10 MG/5ML syrup Take 5 mLs by mouth every 6 (six) hours as needed for cough.   Yes [provider]  insulin glargine (LANTUS) 100 unit/mL SOPN Inject 25-55 Units into the skin See admin instructions. Inject 55 units every morning and 25 units every evening.   Yes [provider]  insulin lispro (HUMALOG) 100 UNIT/ML KiwkPen Inject 10 Units into the skin 3 (three) times daily before meals. *Do Not to inject for levels below 100   Yes [provider]  loperamide (IMODIUM A-D) 2 MG tablet Take 2 mg by mouth as needed for diarrhea or loose stools (up to 8 doses in 24 hrs.).    Yes [provider]  Melatonin 5 MG CAPS Take 10 mg by mouth at bedtime.    Yes [provider]  pantoprazole (PROTONIX) 40 MG tablet Take 1 tablet (40 mg total) by mouth 2 (two) times daily. 11/10/18  Yes Barton Dubois, MD  polyvinyl alcohol (LIQUIFILM TEARS) 1.4 % ophthalmic solution Place 1 drop into both eyes every 4 (four) hours as needed for dry eyes.    Yes [provider]  sertraline (ZOLOFT) 50 MG tablet Take 75 mg by mouth daily.    Yes [provider]  sevelamer carbonate (RENVELA) 800 MG tablet Take 1,600-2,400 mg by mouth See admin instructions. 2400 mg 3 times daily with meals and 1600 mg twice daily with snacks   Yes [provider]  traZODone (DESYREL) 50 MG tablet Take 25 mg by mouth at bedtime as needed for sleep.    Yes [provider]    Current Facility-Administered Medications  Medication Dose Route Frequency Provider Last Rate Last Dose  . 0.9 %  sodium chloride infusion (Manually program via Guardrails IV Fluids)   Intravenous Once Truett Mainland, DO   Stopped at 05/04/19 1900  . ALPRAZolam Duanne Moron) tablet 0.25 mg  0.25 mg Oral BID Truett Mainland,  DO   0.25 mg at 05/05/19 1331  . anti-nausea (EMETROL) solution 30 mL  30 mL Oral Q15 min PRN Truett Mainland, DO      . buPROPion [facility]) tablet 75 mg  75 mg Oral Daily Truett Mainland, DO   75 mg at 05/05/19 1109  . carvedilol (COREG) tablet 6.25 mg  6.25 mg Oral BID WC Truett Mainland, DO  6.25 mg at 05/05/19 1109  . furosemide (LASIX) tablet 40 mg  40 mg Oral Daily Truett Mainland, DO   40 mg at 05/05/19 1109  . gabapentin (NEURONTIN) capsule 300 mg  300 mg Oral See admin instructions Truett Mainland, DO      . gabapentin (NEURONTIN) capsule 600 mg  600 mg Oral QHS Truett Mainland, DO   600 mg at 05/04/19 2314  . guaifenesin (ROBITUSSIN) 100 MG/5ML syrup 200 mg  200 mg Oral Q6H PRN Truett Mainland, DO      . insulin aspart (novoLOG) injection 0-15 Units  0-15 Units Subcutaneous TID WC Truett Mainland, DO   3 Units at 05/05/19 1330  . insulin aspart (novoLOG) injection 0-5 Units  0-5 Units Subcutaneous QHS Truett Mainland, DO   2 Units at 05/04/19 2324  . insulin glargine (LANTUS) injection 30 Units  30 Units Subcutaneous q morning - 10a Orson Eva, MD   30 Units at 05/05/19 1110  . loperamide (IMODIUM) capsule 2 mg  2 mg Oral PRN Truett Mainland, DO      . ondansetron [facility]) tablet 4 mg  4 mg Oral Q6H PRN Truett Mainland, DO       Or  . ondansetron [facility]) injection 4 mg  4 mg Intravenous Q6H PRN Truett Mainland, DO      . pantoprazole (PROTONIX) injection 40 mg  40 mg Intravenous Q12H Truett Mainland, DO   40 mg at 05/05/19 1110  . polyvinyl alcohol (LIQUIFILM TEARS) 1.4 % ophthalmic solution 1 drop  1 drop Both Eyes Q4H PRN Truett Mainland, DO      . sertraline (ZOLOFT) tablet 75 mg  75 mg Oral Daily Stinson, Jacob J, DO      . sevelamer carbonate (RENVELA) tablet 1,600 mg  1,600 mg Oral With snacks Truett Mainland, DO   1,600 mg at 05/05/19 1109  . sevelamer carbonate (RENVELA) tablet 2,400 mg  2,400 mg Oral TID WC Loma Boston J, DO   2,400 mg at 05/05/19 1331  .  traZODone (DESYREL) tablet 25 mg  25 mg Oral QHS PRN Truett Mainland, DO   25 mg at 05/04/19 2314   Current Outpatient Medications  Medication Sig Dispense Refill  . acetaminophen (TYLENOL) 500 MG tablet Take 1,000 mg every 6 (six) hours as needed by mouth (FOR HEADACHES/MINOR DISCOMFORT).    Marland Kitchen ALPRAZolam (XANAX) 0.25 MG tablet Take 0.25 mg by mouth 2 (two) times daily.     Marland Kitchen aluminum-magnesium hydroxide-simethicone (MAALOX) 423-953-20 MG/5ML SUSP Take 30 mLs by mouth 4 (four) times daily as needed (heartburn/indigestion).    Marland Kitchen amLODipine (NORVASC) 5 MG tablet Take 1 tablet (5 mg total) by mouth at bedtime. 30 tablet 3  . anti-nausea (EMETROL) solution Take 30 mLs every 15 (fifteen) minutes as needed by mouth for nausea or vomiting (X4 doses only).    Marland Kitchen b complex vitamins capsule Take 1 capsule by mouth daily.     Marland Kitchen buPROPion (WELLBUTRIN) 75 MG tablet Take 75 mg by mouth daily.     . carvedilol (COREG) 6.25 MG tablet Take 1 tablet (6.25 mg total) by mouth 2 (two) times a day. 60 tablet 4  . cholecalciferol (VITAMIN D3) 25 MCG (1000 UT) tablet Take 1,000 Units by mouth daily.    . fexofenadine (ALLEGRA) 180 MG tablet Take 180 mg by mouth daily.     . furosemide (LASIX) 20 MG tablet Take 1 tablet (20 mg  total) by mouth at bedtime as needed for fluid or edema (swelling). 30 tablet 3  . furosemide (LASIX) 40 MG tablet Take 40 mg by mouth daily.     Marland Kitchen gabapentin (NEURONTIN) 300 MG capsule Take 600 mg by mouth at bedtime.     . gabapentin (NEURONTIN) 300 MG capsule Take 300 mg by mouth See admin instructions. Take 300 mg daily at 6am and at 2pm.    . glipiZIDE (GLUCOTROL) 5 MG tablet Take 1 tablet (5 mg total) by mouth 2 (two) times a day. 60 tablet 3  . guaifenesin (ROBITUSSIN) 100 MG/5ML syrup Take 200 mg every 6 (six) hours as needed by mouth for cough.    Marland Kitchen guaiFENesin-codeine (ROBITUSSIN AC) 100-10 MG/5ML syrup Take 5 mLs by mouth every 6 (six) hours as needed for cough.    . insulin glargine  (LANTUS) 100 unit/mL SOPN Inject 25-55 Units into the skin See admin instructions. Inject 55 units every morning and 25 units every evening.    . insulin lispro (HUMALOG) 100 UNIT/ML KiwkPen Inject 10 Units into the skin 3 (three) times daily before meals. *Do Not to inject for levels below 100    . loperamide (IMODIUM A-D) 2 MG tablet Take 2 mg by mouth as needed for diarrhea or loose stools (up to 8 doses in 24 hrs.).     Marland Kitchen Melatonin 5 MG CAPS Take 10 mg by mouth at bedtime.     . pantoprazole (PROTONIX) 40 MG tablet Take 1 tablet (40 mg total) by mouth 2 (two) times daily.    . polyvinyl alcohol (LIQUIFILM TEARS) 1.4 % ophthalmic solution Place 1 drop into both eyes every 4 (four) hours as needed for dry eyes.     Marland Kitchen sertraline (ZOLOFT) 50 MG tablet Take 75 mg by mouth daily.     . sevelamer carbonate (RENVELA) 800 MG tablet Take 1,600-2,400 mg by mouth See admin instructions. 2400 mg 3 times daily with meals and 1600 mg twice daily with snacks    . traZODone (DESYREL) 50 MG tablet Take 25 mg by mouth at bedtime as needed for sleep.       Allergies as of 05/04/2019 - Review Complete 05/04/2019  Allergen Reaction Noted  . Penicillins Rash     Family History  Problem Relation Age of Onset  . Multiple myeloma Mother   . Diabetes Mother   . Cirrhosis Mother   . Heart attack Father 7  . Colon cancer Neg Hx     Social History   Socioeconomic History  . Marital status: Divorced    Spouse name: Not on file  . Number of children: Not on file  . Years of education: Not on file  . Highest education level: Not on file  Occupational History  . Not on file  Social Needs  . Financial resource strain: Not hard at all  . Food insecurity    Worry: Never true    Inability: Never true  . Transportation needs    Medical: No    Non-medical: No  Tobacco Use  . Smoking status: Current Every Day Smoker    Packs/day: 0.25    Years: 40.00    Pack years: 10.00    Types: Cigarettes  .  Smokeless tobacco: Never Used  Substance and Sexual Activity  . Alcohol use: No    Alcohol/week: 0.0 standard drinks  . Drug use: No  . Sexual activity: Yes    Birth control/protection: Surgical  Lifestyle  . Physical activity  Days per week: 0 days    Minutes per session: 0 min  . Stress: Only a little  Relationships  . Social connections    Talks on phone: More than three times a week    Gets together: Twice a week    Attends religious service: Never    Active member of club or organization: No    Attends meetings of clubs or organizations: Never    Relationship status: Widowed  . Intimate partner violence    Fear of current or ex partner: No    Emotionally abused: No    Physically abused: No    Forced sexual activity: No  Other Topics Concern  . Not on file  Social History Narrative   No children    Review of Systems: See HPI, otherwise normal ROS  Physical Exam: Temp:  [97.6 F (36.4 C)-98.1 F (36.7 C)] 97.8 F (36.6 C) (08/29 1517) Pulse Rate:  [66-88] 71 (08/29 1517) Resp:  [12-24] 15 (08/29 1517) BP: (93-158)/(39-93) 138/56 (08/29 1517) SpO2:  [91 %-100 %] 97 % (08/29 1517) Weight:  [101.5 kg] 101.5 kg (08/28 1626)   Patient was seen in emergency room. She is receiving transfusion. Conjunctiva is pale sclerae nonicteric. Oropharyngeal mucosa is normal.  Few teeth are missing and resting satisfactory condition. No neck masses or thyromegaly noted. Cardiac exam reveals a 2/6 systolic ejection murmur best heard at aortic area. Auscultation lungs reveal vesicular breath sounds bilaterally. Abdomen is protuberant.  Bowel sounds are normal.  She has few small areas of ecchymosis.  On palpation abdomen is soft.  She has mild tenderness in left mid abdomen laterally and right upper quadrant.  No organomegaly or masses. She does not have peripheral edema clubbing or koilonychia.  Lab Results: Recent Labs    05/04/19 1641 05/05/19 0532  WBC 4.3 3.9*  HGB  6.3* 6.3*  HCT 19.8* 20.6*  PLT 143* 131*   BMET Recent Labs    05/04/19 1641  NA 138  K 3.3*  CL 99  CO2 30  GLUCOSE 115*  BUN 17  CREATININE 3.27*  CALCIUM 8.3*   LFT Recent Labs    05/04/19 1641  PROT 6.9  ALBUMIN 3.7  AST 19  ALT 16  ALKPHOS 83  BILITOT 0.5    Studies/Results: Dg Chest Portable 1 View  Result Date: 05/04/2019 CLINICAL DATA:  Shortness of breath, cough and wheezing. EXAM: PORTABLE CHEST 1 VIEW COMPARISON:  01/11/2019 prior radiographs FINDINGS: Cardiomegaly and chronic peribronchial thickening again identified. There is no evidence of focal airspace disease, pulmonary edema, suspicious pulmonary nodule/mass, pleural effusion, or pneumothorax. No acute bony abnormalities are identified. IMPRESSION: Cardiomegaly without evidence of acute cardiopulmonary disease. Chronic peribronchial thickening. Electronically Signed   By: Margarette Canada M.D.   On: 05/04/2019 17:06    Assessment;  Acute on chronic GI bleed in a patient with history of diabetes mellitus and end-stage renal disease.  She now presents with hemoglobin of 3 g.  She is receiving PRBCs.  She is to get 2 units. Her anemia and GI bleed dates back to April 2017.  Since then she has undergone 1 colonoscopy and four EGDs and she also had small bowel given capsule study in between.  She was discovered to have mild gastric antral vascular ectasia treated twice this year with APC but it does not appear that has made any change in her clinical course. She needs to be reevaluated hoping to pinpoint the source of bleeding so that there could  be permanent fix.   Recommendations;  Agree with plans for PRBC transfusion. Proceed with GI bleeding scan as soon as possible. If GI bleeding scan is negative will consider small bowel given capsule study. CBC in a.m.   LOS: 1 day   Diane Hanel  05/05/2019, 3:32 PM

## 2019-05-05 NOTE — Plan of Care (Signed)

## 2019-05-06 LAB — CBC
HCT: 27.8 % — ABNORMAL LOW (ref 36.0–46.0)
Hemoglobin: 8.8 g/dL — ABNORMAL LOW (ref 12.0–15.0)
MCH: 31.4 pg (ref 26.0–34.0)
MCHC: 31.7 g/dL (ref 30.0–36.0)
MCV: 99.3 fL (ref 80.0–100.0)
Platelets: 139 10*3/uL — ABNORMAL LOW (ref 150–400)
RBC: 2.8 MIL/uL — ABNORMAL LOW (ref 3.87–5.11)
RDW: 20 % — ABNORMAL HIGH (ref 11.5–15.5)
WBC: 3.4 10*3/uL — ABNORMAL LOW (ref 4.0–10.5)
nRBC: 0 % (ref 0.0–0.2)

## 2019-05-06 LAB — IRON AND TIBC
Iron: 56 ug/dL (ref 28–170)
Saturation Ratios: 18 % (ref 10.4–31.8)
TIBC: 307 ug/dL (ref 250–450)
UIBC: 251 ug/dL

## 2019-05-06 LAB — BASIC METABOLIC PANEL
Anion gap: 11 (ref 5–15)
BUN: 42 mg/dL — ABNORMAL HIGH (ref 8–23)
CO2: 26 mmol/L (ref 22–32)
Calcium: 8.7 mg/dL — ABNORMAL LOW (ref 8.9–10.3)
Chloride: 99 mmol/L (ref 98–111)
Creatinine, Ser: 6.85 mg/dL — ABNORMAL HIGH (ref 0.44–1.00)
GFR calc Af Amer: 7 mL/min — ABNORMAL LOW (ref >60)
GFR calc non Af Amer: 6 mL/min — ABNORMAL LOW (ref >60)
Glucose, Bld: 111 mg/dL — ABNORMAL HIGH (ref 70–99)
Potassium: 4.2 mmol/L (ref 3.5–5.1)
Sodium: 136 mmol/L (ref 135–145)

## 2019-05-06 LAB — PROTIME-INR
INR: 1.1 (ref 0.8–1.2)
Prothrombin Time: 14.3 seconds (ref 11.4–15.2)

## 2019-05-06 LAB — VITAMIN B12: Vitamin B-12: 475 pg/mL (ref 180–914)

## 2019-05-06 LAB — GLUCOSE, CAPILLARY
Glucose-Capillary: 110 mg/dL — ABNORMAL HIGH (ref 70–99)
Glucose-Capillary: 169 mg/dL — ABNORMAL HIGH (ref 70–99)

## 2019-05-06 LAB — FERRITIN: Ferritin: 240 ng/mL (ref 11–307)

## 2019-05-06 LAB — FOLATE: Folate: 8.7 ng/mL (ref >5.9)

## 2019-05-06 NOTE — Progress Notes (Signed)
  Subjective:  Patient has no complaints.  She feels much better.  She states she had bowel movement this morning and nursing staff Glynis Smiles was not bloody or tarry.  Objective: Blood pressure (!) 163/70, pulse 76, temperature 98 F (36.7 C), temperature source Oral, resp. rate 18, height 5\' 2"  (1.575 m), weight 101.5 kg, SpO2 96 %. Patient is alert and in no acute distress. She is sitting at the edge of the bed. Abdomen is protuberant with organomegaly or masses.  Labs/studies Results:  CBC Latest Ref Rng & Units 05/06/2019 05/05/2019 05/04/2019  WBC 4.0 - 10.5 K/uL 3.4(L) 3.9(L) 4.3  Hemoglobin 12.0 - 15.0 g/dL 8.8(L) 6.3(LL) 6.3(LL)  Hematocrit 36.0 - 46.0 % 27.8(L) 20.6(L) 19.8(L)  Platelets 150 - 400 K/uL 139(L) 131(L) 143(L)    CMP Latest Ref Rng & Units 05/06/2019 05/04/2019 02/28/2019  Glucose 70 - 99 mg/dL 111(H) 115(H) 81  BUN 8 - 23 mg/dL 42(H) 17 29(H)  Creatinine 0.44 - 1.00 mg/dL 6.85(H) 3.27(H) 5.17(H)  Sodium 135 - 145 mmol/L 136 138 136  Potassium 3.5 - 5.1 mmol/L 4.2 3.3(L) 3.8  Chloride 98 - 111 mmol/L 99 99 94(L)  CO2 22 - 32 mmol/L 26 30 30   Calcium 8.9 - 10.3 mg/dL 8.7(L) 8.3(L) 8.2(L)  Total Protein 6.5 - 8.1 g/dL - 6.9 6.5  Total Bilirubin 0.3 - 1.2 mg/dL - 0.5 0.5  Alkaline Phos 38 - 126 U/L - 83 72  AST 15 - 41 U/L - 19 18  ALT 0 - 44 U/L - 16 15     GI bleeding scan last evening was negative for active bleeding.  Assessment:  #1.  GI bleed possibly subacute or chronic.  Patient has received 2 units of PRBCs and her hemoglobin is up to 8.8 g.  GI bleeding scan is negative.  No rectal bleeding or melena since admission.  Based on previous work-up blood loss is suspected to be due to gastric antral vascular ectasia.  Last EGD was in May 2020.  She is not interested in further evaluation at this time.  #2.  Mild thrombocytopenia and leukopenia.  She may have underlying chronic liver disease.  #3.  Diabetes mellitus.  #4.  End-stage renal  disease.  Recommendations:  Will arrange for outpatient abdominal ultrasound with elastography. Patient stable for discharge from GI standpoint. We will have H&H next week at the time of hemodialysis.

## 2019-05-06 NOTE — TOC Transition Note (Signed)
Transition of Care [facility]) - CM/SW Discharge Note   Patient Details  Name: Tanya Lucero MRN: [id number] Date of Birth: 1951/02/02  [facility] CM/SW Contact:  Latanya Maudlin, RN Phone Number: 05/06/2019, 2:34 PM   Clinical Narrative:  Patient to return to [facility] assisted living. SPoke with admissions and they can take her back today. No FL2 needed as there were no new medication changes. Will send updated clinicals. Facility is to provide transport. They will coordinate with bedside RN for pickup time.    Final next level of care: Long Term Nursing Home Barriers to Discharge: No Barriers Identified   Patient Goals and CMS Choice   CMS Medicare.gov Compare Post Acute Care list provided to:: Patient Choice offered to / list presented to : Patient  Discharge Placement                       Discharge Plan and Services                                     Social Determinants of Health (SDOH) Interventions     Readmission Risk Interventions No flowsheet data found.

## 2019-05-06 NOTE — Discharge Summary (Signed)
Physician Discharge Summary  Tanya Lucero IWP:[id number] DOB: 09-27-1950 DOA: 05/04/2019  PCP: Scotty Court, DO  Admit date: 05/04/2019 Discharge date: 05/06/2019  Admitted From: Home Disposition:  Home   Recommendations for Outpatient Follow-up:  1. Follow up with PCP in 1-2 weeks 2. Please obtain BMP/CBC in one week   Discharge Condition: Stable CODE STATUS: FULL Diet recommendation: Renal   Brief/Interim Summary: 68 year old female with a history ofGAVE, diabetes mellitus type 2, anxiety, peripheral neuropathy, ESRD(MWF), hypertension presenting from her dialysis unit with low hemoglobin. Patient states that she had routine blood work performed at her dialysis center on 03/04/2019. She stated that the hemoglobin was noted to be 6.1. After she had the entire session of dialysis, she was told to report to the emergency department for further evaluation. The patient has complained of shortness of breath and dyspnea on exertion for the better part of 1 to 2 months. She has had some intermittent dizziness when standing. However, she denies any fevers, chills, chest pain, nausea, vomiting, diarrhea, abdominal pain, dysuria. The patient has poor vision and is unable to tell me if she has had any hematochezia or melena. The patient was admitted to the hospital from 02/24/2019 through 02/28/2019 for symptomatic anemia. She was transfused 2 units PRBC. GI recommended conservative therapy at that time. 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 This was treated with APC. She also had a previous EGD on 11/30/2018 which showed similar findings. In the emergency department, the patient was afebrile hemodynamically stable saturating 96% on room air. BMP showed a potassium 3.3 with serum creatinine 3.27. Hemoglobin was 6.3 with platelets 1 43,000. Chest x-ray showed chronic peribronchial  thickening. Patient was given 1 unit PRBC, and additional 2 units were ordered. GI was consulted to assist with management.  Discharge Diagnoses:  Symptomatic Anemia/heme positive stool -Transfused 2 additional units PRBC as discussed -GI consult appreciated-->order bleeding scan -8/29 bleeding scan--no active bleeding -planning capsule study as outpt -8/30 discussed with GI, Dr. Levora Dredge for discharge -Continue PPI -Check iron studies--iron saturation 18%, ferritin 240 -Check coags--WNL -clear liquid diet for now>>advanced  Heme positive stool/GAVE -GI consult appreciated -PPI as discussed  ESRD -Patient had full dialysis on 05/04/2019 -Consult nephrology if the patient stays beyond 05/06/2019 -Continue Renvela  Essential hypertension -Continue carvedilol -Restart amlodipine  Diabetes mellitus type 2 -Check hemoglobin A1c--pending at time of d/c -NovoLog sliding scale -Start reduced dose Lantus--resume home dose after discharge with glipizide  Depression/anxiety -Continue home dose Wellbutrin, alprazolam, Zoloft  Thrombocytopenia -chronic, overall stable -N05--[id number] -folic acid 8.7    Discharge Instructions   Allergies as of 05/06/2019      Reactions   Penicillins Rash   Did it involve swelling of the face/tongue/throat, SOB, or low BP? Unknown Did it involve sudden or severe rash/hives, skin peeling, or any reaction on the inside of your mouth or nose? Unknown Did you need to seek medical attention at a hospital or doctor's office? Unknown When did it last happen?unknown If all above answers are "NO", may proceed with cephalosporin use.      Medication List    TAKE these medications   acetaminophen 500 MG tablet Commonly known as: TYLENOL Take 1,000 mg every 6 (six) hours as needed by mouth (FOR HEADACHES/MINOR DISCOMFORT).   ALPRAZolam 0.25 MG tablet Commonly known as: XANAX Take 0.25 mg by mouth 2 (two) times daily.    aluminum-magnesium hydroxide-simethicone 200-200-20  MG/5ML Susp Commonly known as: MAALOX Take 30 mLs by mouth 4 (four) times daily as needed (heartburn/indigestion).   amLODipine 5 MG tablet Commonly known as: NORVASC Take 1 tablet (5 mg total) by mouth at bedtime.   anti-nausea solution Take 30 mLs every 15 (fifteen) minutes as needed by mouth for nausea or vomiting (X4 doses only).   b complex vitamins capsule Take 1 capsule by mouth daily.   buPROPion 75 MG tablet Commonly known as: WELLBUTRIN Take 75 mg by mouth daily.   carvedilol 6.25 MG tablet Commonly known as: COREG Take 1 tablet (6.25 mg total) by mouth 2 (two) times a day.   cholecalciferol 25 MCG (1000 UT) tablet Commonly known as: VITAMIN D3 Take 1,000 Units by mouth daily.   fexofenadine 180 MG tablet Commonly known as: ALLEGRA Take 180 mg by mouth daily.   furosemide 40 MG tablet Commonly known as: LASIX Take 40 mg by mouth daily.   furosemide 20 MG tablet Commonly known as: LASIX Take 1 tablet (20 mg total) by mouth at bedtime as needed for fluid or edema (swelling).   gabapentin 300 MG capsule Commonly known as: NEURONTIN Take 600 mg by mouth at bedtime.   gabapentin 300 MG capsule Commonly known as: NEURONTIN Take 300 mg by mouth See admin instructions. Take 300 mg daily at 6am and at 2pm.   glipiZIDE 5 MG tablet Commonly known as: GLUCOTROL Take 1 tablet (5 mg total) by mouth 2 (two) times a day.   guaifenesin 100 MG/5ML syrup Commonly known as: ROBITUSSIN Take 200 mg every 6 (six) hours as needed by mouth for cough.   guaiFENesin-codeine 100-10 MG/5ML syrup Commonly known as: ROBITUSSIN AC Take 5 mLs by mouth every 6 (six) hours as needed for cough.   insulin glargine 100 unit/mL Sopn Commonly known as: LANTUS Inject 25-55 Units into the skin See admin instructions. Inject 55 units every morning and 25 units every evening.   insulin lispro 100 UNIT/ML KiwkPen Commonly known as:  HUMALOG Inject 10 Units into the skin 3 (three) times daily before meals. *Do Not to inject for levels below 100   loperamide 2 MG tablet Commonly known as: IMODIUM A-D Take 2 mg by mouth as needed for diarrhea or loose stools (up to 8 doses in 24 hrs.).   Melatonin 5 MG Caps Take 10 mg by mouth at bedtime.   pantoprazole 40 MG tablet Commonly known as: PROTONIX Take 1 tablet (40 mg total) by mouth 2 (two) times daily.   polyvinyl alcohol 1.4 % ophthalmic solution Commonly known as: LIQUIFILM TEARS Place 1 drop into both eyes every 4 (four) hours as needed for dry eyes.   sertraline 50 MG tablet Commonly known as: ZOLOFT Take 75 mg by mouth daily.   sevelamer carbonate 800 MG tablet Commonly known as: RENVELA Take 1,600-2,400 mg by mouth See admin instructions. 2400 mg 3 times daily with meals and 1600 mg twice daily with snacks   traZODone 50 MG tablet Commonly known as: DESYREL Take 25 mg by mouth at bedtime as needed for sleep.       Allergies  Allergen Reactions  . Penicillins Rash    Did it involve swelling of the face/tongue/throat, SOB, or low BP? Unknown Did it involve sudden or severe rash/hives, skin peeling, or any reaction on the inside of your mouth or nose? Unknown Did you need to seek medical attention at a hospital or doctor's office? Unknown When did it last happen?unknown If all above  answers are "NO", may proceed with cephalosporin use.      Consultations:  GI   Procedures/Studies: Nm Gi Blood Loss  Result Date: 05/05/2019 CLINICAL DATA:  68 year old female with GI bleed. EXAM: NUCLEAR MEDICINE GASTROINTESTINAL BLEEDING SCAN TECHNIQUE: Sequential abdominal images were obtained following intravenous administration of Tc-41m labeled red blood cells. RADIOPHARMACEUTICALS:  23.9 mCi Tc-61m pertechnetate in-vitro labeled red cells. COMPARISON:  None. FINDINGS: No abnormal areas of activity are noted to suggest active GI bleeding. No  abnormalities are identified. IMPRESSION: No evidence of active GI bleed. If the patient has documented active GI bleeding in the next 24 hours, reimaging may be helpful for further evaluation. Electronically Signed   By: Margarette Canada M.D.   On: 05/05/2019 20:43   Dg Chest Portable 1 View  Result Date: 05/04/2019 CLINICAL DATA:  Shortness of breath, cough and wheezing. EXAM: PORTABLE CHEST 1 VIEW COMPARISON:  01/11/2019 prior radiographs FINDINGS: Cardiomegaly and chronic peribronchial thickening again identified. There is no evidence of focal airspace disease, pulmonary edema, suspicious pulmonary nodule/mass, pleural effusion, or pneumothorax. No acute bony abnormalities are identified. IMPRESSION: Cardiomegaly without evidence of acute cardiopulmonary disease. Chronic peribronchial thickening. Electronically Signed   By: Margarette Canada M.D.   On: 05/04/2019 17:06         Discharge Exam: Vitals:   05/06/19 0415 05/06/19 0519  BP: (!) 147/65 (!) 163/70  Pulse: 73 76  Resp: 16 18  Temp: 98.3 F (36.8 C) 98 F (36.7 C)  SpO2: 96% 96%   Vitals:   05/06/19 0112 05/06/19 0205 05/06/19 0415 05/06/19 0519  BP: (!) 157/69 (!) 158/74 (!) 147/65 (!) 163/70  Pulse: 71 65 73 76  Resp: 18 18 16 18   Temp: 98.4 F (36.9 C) 98.2 F (36.8 C) 98.3 F (36.8 C) 98 F (36.7 C)  TempSrc: Oral Oral Oral Oral  SpO2: 99% 98% 96% 96%  Weight:      Height:        General: Pt is alert, awake, not in acute distress Cardiovascular: RRR, S1/S2 +, no rubs, no gallops Respiratory: CTA bilaterally, no wheezing, no rhonchi Abdominal: Soft, NT, ND, bowel sounds + Extremities: 1+ LE edema, no cyanosis   The results of significant diagnostics from this hospitalization (including imaging, microbiology, ancillary and laboratory) are listed below for reference.    Significant Diagnostic Studies: Nm Gi Blood Loss  Result Date: 05/05/2019 CLINICAL DATA:  68 year old female with GI bleed. EXAM: NUCLEAR MEDICINE  GASTROINTESTINAL BLEEDING SCAN TECHNIQUE: Sequential abdominal images were obtained following intravenous administration of Tc-83m labeled red blood cells. RADIOPHARMACEUTICALS:  23.9 mCi Tc-66m pertechnetate in-vitro labeled red cells. COMPARISON:  None. FINDINGS: No abnormal areas of activity are noted to suggest active GI bleeding. No abnormalities are identified. IMPRESSION: No evidence of active GI bleed. If the patient has documented active GI bleeding in the next 24 hours, reimaging may be helpful for further evaluation. Electronically Signed   By: Margarette Canada M.D.   On: 05/05/2019 20:43   Dg Chest Portable 1 View  Result Date: 05/04/2019 CLINICAL DATA:  Shortness of breath, cough and wheezing. EXAM: PORTABLE CHEST 1 VIEW COMPARISON:  01/11/2019 prior radiographs FINDINGS: Cardiomegaly and chronic peribronchial thickening again identified. There is no evidence of focal airspace disease, pulmonary edema, suspicious pulmonary nodule/mass, pleural effusion, or pneumothorax. No acute bony abnormalities are identified. IMPRESSION: Cardiomegaly without evidence of acute cardiopulmonary disease. Chronic peribronchial thickening. Electronically Signed   By: Margarette Canada M.D.   On: 05/04/2019 17:06  Microbiology: Recent Results (from the past 240 hour(s))  SARS Coronavirus 2 [facility] order, Performed in [facility] hospital lab) Nasopharyngeal Nasopharyngeal Swab     Status: None   Collection Time: 05/04/19  7:00 PM   Specimen: Nasopharyngeal Swab  Result Value Ref Range Status   SARS Coronavirus 2 NEGATIVE NEGATIVE Final    Comment: (NOTE) If result is NEGATIVE SARS-CoV-2 target nucleic acids are NOT DETECTED. The SARS-CoV-2 RNA is generally detectable in upper and lower  respiratory specimens during the acute phase of infection. The lowest  concentration of SARS-CoV-2 viral copies this assay can detect is 250  copies / mL. A negative result does not preclude SARS-CoV-2 infection  and should  not be used as the sole basis for treatment or other  patient management decisions.  A negative result may occur with  improper specimen collection / handling, submission of specimen other  than nasopharyngeal swab, presence of viral mutation(s) within the  areas targeted by this assay, and inadequate number of viral copies  (<250 copies / mL). A negative result must be combined with clinical  observations, patient history, and epidemiological information. If result is POSITIVE SARS-CoV-2 target nucleic acids are DETECTED. The SARS-CoV-2 RNA is generally detectable in upper and lower  respiratory specimens dur ing the acute phase of infection.  Positive  results are indicative of active infection with SARS-CoV-2.  Clinical  correlation with patient history and other diagnostic information is  necessary to determine patient infection status.  Positive results do  not rule out bacterial infection or co-infection with other viruses. If result is PRESUMPTIVE POSTIVE SARS-CoV-2 nucleic acids MAY BE PRESENT.   A presumptive positive result was obtained on the submitted specimen  and confirmed on repeat testing.  While 2019 novel coronavirus  (SARS-CoV-2) nucleic acids may be present in the submitted sample  additional confirmatory testing may be necessary for epidemiological  and / or clinical management purposes  to differentiate between  SARS-CoV-2 and other Sarbecovirus currently known to infect humans.  If clinically indicated additional testing with an alternate test  methodology [phone]) is advised. The SARS-CoV-2 RNA is generally  detectable in upper and lower respiratory sp ecimens during the acute  phase of infection. The expected result is Negative. Fact Sheet for Patients:  StrictlyIdeas.no Fact Sheet for Healthcare Providers: BankingDealers.co.za This test is not yet approved or cleared by the [place] FDA and has been  authorized for detection and/or diagnosis of SARS-CoV-2 by FDA under an Emergency Use Authorization (EUA).  This EUA will remain in effect (meaning this test can be used) for the duration of the COVID-19 declaration under Section 564(b)(1) of the Act, 21 U.S.C. section 360bbb-3(b)(1), unless the authorization is terminated or revoked sooner. Performed at [facility], [address] [place], [place] [address]   MRSA PCR Screening     Status: None   Collection Time: 05/05/19  5:35 PM   Specimen: Nasopharyngeal  Result Value Ref Range Status   MRSA by PCR NEGATIVE NEGATIVE Final    Comment:        The GeneXpert MRSA Assay (FDA approved for NASAL specimens only), is one component of a comprehensive MRSA colonization surveillance program. It is not intended to diagnose MRSA infection nor to guide or monitor treatment for MRSA infections. Performed at [facility], [address], [place], [place] [address]      Labs: Basic Metabolic Panel: Recent Labs  Lab 05/04/19 1641 05/06/19 0559  NA 138 136  K 3.3* 4.2  CL 99 99  CO2 30 26  GLUCOSE 115* 111*  BUN 17 42*  CREATININE 3.27* 6.85*  CALCIUM 8.3* 8.7*   Liver Function Tests: Recent Labs  Lab 05/04/19 1641  AST 19  ALT 16  ALKPHOS 83  BILITOT 0.5  PROT 6.9  ALBUMIN 3.7   No results for input(s): LIPASE, AMYLASE in the last 168 hours. No results for input(s): AMMONIA in the last 168 hours. CBC: Recent Labs  Lab 05/04/19 1641 05/05/19 0532 05/06/19 0559  WBC 4.3 3.9* 3.4*  NEUTROABS 3.0  --   --   HGB 6.3* 6.3* 8.8*  HCT 19.8* 20.6* 27.8*  MCV 107.6* 109.6* 99.3  PLT 143* 131* 139*   Cardiac Enzymes: No results for input(s): CKTOTAL, CKMB, CKMBINDEX, TROPONINI in the last 168 hours. BNP: Invalid input(s): POCBNP CBG: Recent Labs  Lab 05/05/19 1236 05/05/19 1610 05/05/19 2110 05/06/19 0726 05/06/19 1126  GLUCAP 186* 106* 132* 110* 169*    Time coordinating discharge:  36 minutes  Signed:   Orson Eva, DO [facility] Pager: [phone] 05/06/2019, 2:07 PM

## 2019-05-06 NOTE — Progress Notes (Addendum)
PROGRESS NOTE  Tanya Lucero SNK:[id number] DOB: [birth year] DOA: 05/04/2019 PCP: Scotty Court, DO  Brief History:  68 year old female with a history of GAVE, diabetes mellitus type 2, anxiety, peripheral neuropathy, ESRD (MWF), hypertension presenting from her dialysis unit with low hemoglobin.  Patient states that she had routine blood work performed at her dialysis center on 03/04/2019.  She stated that the hemoglobin was noted to be 6.1.  After she had the entire session of dialysis, she was told to report to the emergency department for further evaluation.  The patient has complained of shortness of breath and dyspnea on exertion for the better part of 1 to 2 months.  She has had some intermittent dizziness when standing.  However, she denies any fevers, chills, chest pain, nausea, vomiting, diarrhea, abdominal pain, dysuria.  The patient has poor vision and is unable to tell me if she has had any hematochezia or melena.  The patient was admitted to the hospital from 02/24/2019 through 02/28/2019 for symptomatic anemia.  She was transfused 2 units PRBC.  GI recommended conservative therapy at that time.  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  This was treated with APC.  She also had a previous EGD on 11/30/2018 which showed similar findings. In the emergency department, the patient was afebrile hemodynamically stable saturating 96% on room air.  BMP showed a potassium 3.3 with serum creatinine 3.27.  Hemoglobin was 6.3 with platelets 1 43,000.  Chest x-ray showed chronic peribronchial thickening.  Patient was given 1 unit PRBC, and additional 2 units were ordered.  GI was consulted to assist with management.  Assessment/Plan: Symptomatic Anemia/heme positive stool -Transfused 2 additional units PRBC as discussed -GI consult appreciated -8/29 bleeding scan--no active bleeding -planning  capsule study -Continue PPI -Check iron studies--iron saturation 18%, ferritin 240 -Check coags--WNL -clear liquid diet for now  Heme positive stool/GAVE -GI consult appreciated -PPI as discussed  ESRD -Patient had full dialysis on 05/04/2019 -Consult nephrology if the patient stays beyond 05/06/2019 -Continue Renvela  Essential hypertension -Continue carvedilol -Restart amlodipine  Diabetes mellitus type 2 -Check hemoglobin A1c -NovoLog sliding scale -Start reduced dose Lantus  Depression/anxiety -Continue home dose Wellbutrin, alprazolam, Zoloft  Thrombocytopenia -chronic, overall stable -P37--902 -folic acid 8.7      Disposition Plan:   ALF when cleared by GI  Family Communication:  No Family at bedside  Consultants:  GI  Code Status:  FULL  DVT Prophylaxis:  SCDs   Procedures: As Listed in Progress Note Above  Antibiotics: None    Subjective: Patient denies fevers, chills, headache, chest pain, dyspnea, nausea, vomiting, diarrhea, abdominal pain, dysuria, hematuria, hematochezia, and melena.   Objective: Vitals:   05/06/19 0112 05/06/19 0205 05/06/19 0415 05/06/19 0519  BP: (!) 157/69 (!) 158/74 (!) 147/65 (!) 163/70  Pulse: 71 65 73 76  Resp: 18 18 16 18   Temp: 98.4 F (36.9 C) 98.2 F (36.8 C) 98.3 F (36.8 C) 98 F (36.7 C)  TempSrc: Oral Oral Oral Oral  SpO2: 99% 98% 96% 96%  Weight:      Height:        Intake/Output Summary (Last 24 hours) at 05/06/2019 1019 Last data filed at 05/06/2019 0900 Gross per 24 hour  Intake 1425 ml  Output -  Net 1425 ml   Weight change:  Exam:   General:  Pt is  alert, follows commands appropriately, not in acute distress  HEENT: No icterus, No thrush, No neck mass, [place]/AT  Cardiovascular: RRR, S1/S2, no rubs, no gallops  Respiratory: CTA bilaterally, no wheezing, no crackles, no rhonchi  Abdomen: Soft/+BS, non tender, non distended, no guarding  Extremities:  1+ LE edema,  No lymphangitis, No petechiae, No rashes, no synovitis   Data Reviewed: I have personally reviewed following labs and imaging studies Basic Metabolic Panel: Recent Labs  Lab 05/04/19 1641 05/06/19 0559  NA 138 136  K 3.3* 4.2  CL 99 99  CO2 30 26  GLUCOSE 115* 111*  BUN 17 42*  CREATININE 3.27* 6.85*  CALCIUM 8.3* 8.7*   Liver Function Tests: Recent Labs  Lab 05/04/19 1641  AST 19  ALT 16  ALKPHOS 83  BILITOT 0.5  PROT 6.9  ALBUMIN 3.7   No results for input(s): LIPASE, AMYLASE in the last 168 hours. No results for input(s): AMMONIA in the last 168 hours. Coagulation Profile: Recent Labs  Lab 05/06/19 0559  INR 1.1   CBC: Recent Labs  Lab 05/04/19 1641 05/05/19 0532 05/06/19 0559  WBC 4.3 3.9* 3.4*  NEUTROABS 3.0  --   --   HGB 6.3* 6.3* 8.8*  HCT 19.8* 20.6* 27.8*  MCV 107.6* 109.6* 99.3  PLT 143* 131* 139*   Cardiac Enzymes: No results for input(s): CKTOTAL, CKMB, CKMBINDEX, TROPONINI in the last 168 hours. BNP: Invalid input(s): POCBNP CBG: Recent Labs  Lab 05/05/19 0759 05/05/19 1236 05/05/19 1610 05/05/19 2110 05/06/19 0726  GLUCAP 77 186* 106* 132* 110*   HbA1C: No results for input(s): HGBA1C in the last 72 hours. Urine analysis: No results found for: COLORURINE, APPEARANCEUR, LABSPEC, PHURINE, GLUCOSEU, HGBUR, BILIRUBINUR, KETONESUR, PROTEINUR, UROBILINOGEN, NITRITE, LEUKOCYTESUR Sepsis Labs: @LABRCNTIP (procalcitonin:4,lacticidven:4) ) Recent Results (from the past 240 hour(s))  SARS Coronavirus 2 [facility] order, Performed in [facility] hospital lab) Nasopharyngeal Nasopharyngeal Swab     Status: None   Collection Time: 05/04/19  7:00 PM   Specimen: Nasopharyngeal Swab  Result Value Ref Range Status   SARS Coronavirus 2 NEGATIVE NEGATIVE Final    Comment: (NOTE) If result is NEGATIVE SARS-CoV-2 target nucleic acids are NOT DETECTED. The SARS-CoV-2 RNA is generally detectable in upper and lower  respiratory specimens during  the acute phase of infection. The lowest  concentration of SARS-CoV-2 viral copies this assay can detect is 250  copies / mL. A negative result does not preclude SARS-CoV-2 infection  and should not be used as the sole basis for treatment or other  patient management decisions.  A negative result may occur with  improper specimen collection / handling, submission of specimen other  than nasopharyngeal swab, presence of viral mutation(s) within the  areas targeted by this assay, and inadequate number of viral copies  (<250 copies / mL). A negative result must be combined with clinical  observations, patient history, and epidemiological information. If result is POSITIVE SARS-CoV-2 target nucleic acids are DETECTED. The SARS-CoV-2 RNA is generally detectable in upper and lower  respiratory specimens dur ing the acute phase of infection.  Positive  results are indicative of active infection with SARS-CoV-2.  Clinical  correlation with patient history and other diagnostic information is  necessary to determine patient infection status.  Positive results do  not rule out bacterial infection or co-infection with other viruses. If result is PRESUMPTIVE POSTIVE SARS-CoV-2 nucleic acids MAY BE PRESENT.   A presumptive positive result was obtained on the submitted specimen  and confirmed on  repeat testing.  While 2019 novel coronavirus  (SARS-CoV-2) nucleic acids may be present in the submitted sample  additional confirmatory testing may be necessary for epidemiological  and / or clinical management purposes  to differentiate between  SARS-CoV-2 and other Sarbecovirus currently known to infect humans.  If clinically indicated additional testing with an alternate test  methodology [phone]) is advised. The SARS-CoV-2 RNA is generally  detectable in upper and lower respiratory sp ecimens during the acute  phase of infection. The expected result is Negative. Fact Sheet for Patients:   StrictlyIdeas.no Fact Sheet for Healthcare Providers: BankingDealers.co.za This test is not yet approved or cleared by the [place] FDA and has been authorized for detection and/or diagnosis of SARS-CoV-2 by FDA under an Emergency Use Authorization (EUA).  This EUA will remain in effect (meaning this test can be used) for the duration of the COVID-19 declaration under Section 564(b)(1) of the Act, 21 U.S.C. section 360bbb-3(b)(1), unless the authorization is terminated or revoked sooner. Performed at [facility], [address], [place], [place] [address]   MRSA PCR Screening     Status: None   Collection Time: 05/05/19  5:35 PM   Specimen: Nasopharyngeal  Result Value Ref Range Status   MRSA by PCR NEGATIVE NEGATIVE Final    Comment:        The GeneXpert MRSA Assay (FDA approved for NASAL specimens only), is one component of a comprehensive MRSA colonization surveillance program. It is not intended to diagnose MRSA infection nor to guide or monitor treatment for MRSA infections. Performed at [facility], [address], [place], [place] [address]      Scheduled Meds: . sodium chloride   Intravenous Once  . ALPRAZolam  0.25 mg Oral BID  . buPROPion  75 mg Oral Daily  . carvedilol  6.25 mg Oral BID WC  . furosemide  40 mg Oral Daily  . gabapentin  300 mg Oral 2 times per day  . gabapentin  600 mg Oral QHS  . insulin aspart  0-15 Units Subcutaneous TID WC  . insulin aspart  0-5 Units Subcutaneous QHS  . insulin glargine  30 Units Subcutaneous q morning - 10a  . pantoprazole (PROTONIX) IV  40 mg Intravenous Q12H  . sertraline  75 mg Oral Daily  . sevelamer carbonate  1,600 mg Oral With snacks  . sevelamer carbonate  2,400 mg Oral TID WC   Continuous Infusions:  Procedures/Studies: Nm Gi Blood Loss  Result Date: 05/05/2019 CLINICAL DATA:  69 year old female with GI bleed. EXAM: NUCLEAR MEDICINE GASTROINTESTINAL  BLEEDING SCAN TECHNIQUE: Sequential abdominal images were obtained following intravenous administration of Tc-20m labeled red blood cells. RADIOPHARMACEUTICALS:  23.9 mCi Tc-21m pertechnetate in-vitro labeled red cells. COMPARISON:  None. FINDINGS: No abnormal areas of activity are noted to suggest active GI bleeding. No abnormalities are identified. IMPRESSION: No evidence of active GI bleed. If the patient has documented active GI bleeding in the next 24 hours, reimaging may be helpful for further evaluation. Electronically Signed   By: Margarette Canada M.D.   On: 05/05/2019 20:43   Dg Chest Portable 1 View  Result Date: 05/04/2019 CLINICAL DATA:  Shortness of breath, cough and wheezing. EXAM: PORTABLE CHEST 1 VIEW COMPARISON:  01/11/2019 prior radiographs FINDINGS: Cardiomegaly and chronic peribronchial thickening again identified. There is no evidence of focal airspace disease, pulmonary edema, suspicious pulmonary nodule/mass, pleural effusion, or pneumothorax. No acute bony abnormalities are identified. IMPRESSION: Cardiomegaly without evidence of acute cardiopulmonary disease. Chronic peribronchial thickening.  Electronically Signed   By: Margarette Canada M.D.   On: 05/04/2019 17:06    Orson Eva, DO  [facility] Pager [phone]  If 7PM-7AM, please contact night-coverage [email or web address] Password [email or web address] 05/06/2019, 10:19 AM   LOS: 2 days

## 2019-05-06 NOTE — Progress Notes (Signed)
UNMATCHED BLOOD PRODUCT NOTE  Compare the patient ID on the blood tag to the patient ID on the hospital armband and Blood Bank armband. Then confirm the unit number on the blood tag matches the unit number on the blood product.  If a discrepancy is discovered return the product to blood bank immediately.   Blood Product Type: PRBC  Unit #: [id number]  Product Code #: [id number]   Start Time: 0130   Starting Rate: 120 ml/hr  Rate increase/decreased  (if applicable):    295  ml/hr  Rate changed time (if applicable): 1884    Stop Time: **0417 *   All Other Documentation should be documented within the Blood Admin Flowsheet per policy.

## 2019-05-07 LAB — TYPE AND SCREEN
ABO/RH(D): O POS
Antibody Screen: POSITIVE
Donor AG Type: NEGATIVE
Donor AG Type: NEGATIVE
Donor AG Type: NEGATIVE
Unit division: 0
Unit division: 0
Unit division: 0

## 2019-05-07 LAB — BPAM RBC
Blood Product Expiration Date: 202010012359
Blood Product Expiration Date: 202010012359
Blood Product Expiration Date: 202010022359
ISSUE DATE / TIME: 202008291044
ISSUE DATE / TIME: 202008291044
ISSUE DATE / TIME: 202008291425
Unit Type and Rh: 5100
Unit Type and Rh: 5100
Unit Type and Rh: 5100

## 2019-05-07 LAB — HEMOGLOBIN A1C
Hgb A1c MFr Bld: 4.7 % — ABNORMAL LOW (ref 4.8–5.6)
Mean Plasma Glucose: 88.19 mg/dL

## 2019-05-15 ENCOUNTER — Ambulatory Visit (INDEPENDENT_AMBULATORY_CARE_PROVIDER_SITE_OTHER): Payer: Medicare Other | Admitting: Internal Medicine

## 2019-06-05 ENCOUNTER — Ambulatory Visit (INDEPENDENT_AMBULATORY_CARE_PROVIDER_SITE_OTHER): Payer: Medicare Other | Admitting: Internal Medicine

## 2019-06-05 ENCOUNTER — Encounter (INDEPENDENT_AMBULATORY_CARE_PROVIDER_SITE_OTHER): Payer: Self-pay | Admitting: Internal Medicine

## 2019-06-05 ENCOUNTER — Other Ambulatory Visit: Payer: Self-pay

## 2019-06-05 VITALS — BP 132/78 | HR 80 | Temp 98.4°F | Ht 62.0 in | Wt 223.4 lb

## 2019-06-05 DIAGNOSIS — K2100 Gastro-esophageal reflux disease with esophagitis, without bleeding: Secondary | ICD-10-CM

## 2019-06-05 DIAGNOSIS — K31819 Angiodysplasia of stomach and duodenum without bleeding: Secondary | ICD-10-CM

## 2019-06-05 DIAGNOSIS — D500 Iron deficiency anemia secondary to blood loss (chronic): Secondary | ICD-10-CM | POA: Insufficient documentation

## 2019-06-05 DIAGNOSIS — K219 Gastro-esophageal reflux disease without esophagitis: Secondary | ICD-10-CM | POA: Insufficient documentation

## 2019-06-05 MED ORDER — LOPERAMIDE HCL 2 MG PO CAPS: 2.0000 mg | ORAL_CAPSULE | Freq: Three times a day (TID) | ORAL | 5 refills | Status: AC

## 2019-06-05 NOTE — Progress Notes (Signed)
Presenting complaint;  Follow for GI bleed and anemia. History of GERD.  Database and subjective:  Patient is 68 year old Caucasian female who has chronic GERD requiring double dose PPI as well as history of GI bleed felt to be from small bowel but recent EGDs had mild gastric antral vascular ectasia for which she is undergone APC therapy twice.  Patient was hospitalized 1 month ago and received transfusion.  She has required multiple transfusions previously as well.  Patient is accompanied by Jarrett Soho who is a caregiver. She has no complaints.  She says she had hemoglobin checked recently and she was told was good.  She does not remember the value.  She denies melena or rectal bleeding.  She does complain of diarrhea.  She says she has 4-6 stools daily.  Consistency ranges from normal to lose.  She cannot tell if she has melena or rectal bleeding because of poor vision.  She is using Imodium on as-needed basis. She does not want to decrease PPI dose to once daily because it has not worked in the past and he is miserable.  She has intermittent nausea but no vomiting.  She denies abdominal pain and her appetite is good.  She does not take aspirin or other OTC NSAIDs. She remains on dialysis every Monday Wednesday and Friday.  Current Medications: Outpatient Encounter Medications as of 06/05/2019  Medication Sig  . acetaminophen (TYLENOL) 500 MG tablet Take 1,000 mg every 6 (six) hours as needed by mouth (FOR HEADACHES/MINOR DISCOMFORT).  Marland Kitchen ALPRAZolam (XANAX) 0.25 MG tablet Take 0.25 mg by mouth 2 (two) times daily.   Marland Kitchen aluminum-magnesium hydroxide-simethicone (MAALOX) 563-875-64 MG/5ML SUSP Take 30 mLs by mouth 4 (four) times daily as needed (heartburn/indigestion).  Marland Kitchen amLODipine (NORVASC) 5 MG tablet Take 1 tablet (5 mg total) by mouth at bedtime.  Marland Kitchen anti-nausea (EMETROL) solution Take 30 mLs every 15 (fifteen) minutes as needed by mouth for nausea or vomiting (X4 doses only).  Marland Kitchen b complex  vitamins capsule Take 1 capsule by mouth daily.   Marland Kitchen buPROPion (WELLBUTRIN) 75 MG tablet Take 75 mg by mouth daily.   . carvedilol (COREG) 6.25 MG tablet Take 1 tablet (6.25 mg total) by mouth 2 (two) times a day.  . cholecalciferol (VITAMIN D3) 25 MCG (1000 UT) tablet Take 1,000 Units by mouth daily.  . fexofenadine (ALLEGRA) 180 MG tablet Take 180 mg by mouth daily.   . furosemide (LASIX) 20 MG tablet Take 1 tablet (20 mg total) by mouth at bedtime as needed for fluid or edema (swelling). (Patient taking differently: Take 40 mg by mouth at bedtime as needed for fluid or edema (swelling). )  . furosemide (LASIX) 40 MG tablet Take 40 mg by mouth daily.   Marland Kitchen gabapentin (NEURONTIN) 300 MG capsule Take 600 mg by mouth at bedtime.   . gabapentin (NEURONTIN) 300 MG capsule Take 300 mg by mouth See admin instructions. Take 300 mg daily at 6am and at 2pm.  . glipiZIDE (GLUCOTROL) 5 MG tablet Take 1 tablet (5 mg total) by mouth 2 (two) times a day.  . guaifenesin (ROBITUSSIN) 100 MG/5ML syrup Take 200 mg every 6 (six) hours as needed by mouth for cough.  Marland Kitchen guaiFENesin-codeine (ROBITUSSIN AC) 100-10 MG/5ML syrup Take 5 mLs by mouth every 6 (six) hours as needed for cough.  . insulin glargine (LANTUS) 100 unit/mL SOPN Inject 25-55 Units into the skin See admin instructions. Inject 55 units every morning and 25 units every evening.  . insulin lispro (  HUMALOG) 100 UNIT/ML KiwkPen Inject 10 Units into the skin 3 (three) times daily before meals. *Do Not to inject for levels below 100  . loperamide (IMODIUM A-D) 2 MG tablet Take 2 mg by mouth as needed for diarrhea or loose stools (up to 8 doses in 24 hrs.).   Marland Kitchen Melatonin 5 MG CAPS Take 10 mg by mouth at bedtime.   . pantoprazole (PROTONIX) 40 MG tablet Take 1 tablet (40 mg total) by mouth 2 (two) times daily.  . polyvinyl alcohol (LIQUIFILM TEARS) 1.4 % ophthalmic solution Place 1 drop into both eyes every 4 (four) hours as needed for dry eyes.   Marland Kitchen sertraline  (ZOLOFT) 50 MG tablet Take 75 mg by mouth daily.   . sevelamer carbonate (RENVELA) 800 MG tablet Take 1,600-2,400 mg by mouth See admin instructions. 2400 mg 3 times daily with meals and 1600 mg twice daily with snacks  . traZODone (DESYREL) 50 MG tablet Take 25 mg by mouth at bedtime as needed for sleep.    No facility-administered encounter medications on file as of 06/05/2019.      Objective: Blood pressure 132/78, pulse 80, temperature 98.4 F (36.9 C), temperature source Oral, height 5\' 2"  (1.575 m), weight 223 lb 6.4 oz (101.3 kg). Patient is alert and in no acute distress. She is wearing facial mask. She does not appear pale.  She is using walker to ambulate. Conjunctiva is pink. Sclera is nonicteric Oropharyngeal mucosa is normal. No neck masses or thyromegaly noted. Cardiac exam with regular rhythm normal S1 and S2. No murmur or gallop noted. Lungs are clear to auscultation. Abdomen is obese.  She has mild tenderness in right lower quadrant but no organomegaly or masses.  Examination was somewhat limited in sitting position. No clubbing or koilonychia.  She has trace edema to both feet.  Labs/studies Results:  Lab data from 05/06/2019  Serum folate 8.7 Serum B12 475 Serum iron 56, TIBC 307 and saturation 18%. Serum ferritin 240.  H&H was 8.8 and 27.8.  H&H on 05/05/2019 was 6.3 and 20.6  Assessment:  #1.  History of anemia secondary to chronic GI bleed.  She is requiring intermittent blood transfusion but not since May 05, 2019.  Recent iron studies were normal.  She has a history of mild gave and has undergone APC therapy twice but she has not been documented to be actively bleeding from these lesions.  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  No plans for further work-up unless bleeding recurs or she needs blood transfusion.  #2.  Chronic GERD.  She is requiring double dose PPI.   Plan:  I recommended if staff could look her stool and determine  if she has melena or rectal bleeding. She will continue to have H&H every 2 weeks or so. Office visit in 6 months.

## 2019-06-05 NOTE — Patient Instructions (Signed)
Notify if you have tarry stool or rectal bleeding

## 2019-06-27 ENCOUNTER — Other Ambulatory Visit (INDEPENDENT_AMBULATORY_CARE_PROVIDER_SITE_OTHER): Payer: Self-pay | Admitting: *Deleted

## 2019-06-27 ENCOUNTER — Telehealth (HOSPITAL_COMMUNITY): Payer: Self-pay | Admitting: *Deleted

## 2019-06-27 NOTE — Telephone Encounter (Signed)
Request received to schedule patient for blood transfusion, as most recent Hgb 7.0.  All SNF patients must be COVID tested prior to entering the building to receive a blood transfusion.  Patient leaves facility 3 days a week for dialysis, per Tye [place] at [facility] in [place], therefore has no way to quarantine.  At this time, Rapid tests are not readily available unless an emergent procedure is required.  Dr. Laural Golden and Einar Gip, PA made aware of situation and available options provided.  Will await final decision from providers and proceed with best plan for patient.

## 2019-06-29 NOTE — Telephone Encounter (Signed)
She will have H&H next week and if it is less than 7 she will just have to be admitted for transfusion

## 2019-07-02 ENCOUNTER — Encounter (HOSPITAL_COMMUNITY): Payer: Self-pay

## 2019-07-02 ENCOUNTER — Other Ambulatory Visit (INDEPENDENT_AMBULATORY_CARE_PROVIDER_SITE_OTHER): Payer: Self-pay | Admitting: *Deleted

## 2019-07-02 ENCOUNTER — Telehealth (HOSPITAL_COMMUNITY): Payer: Self-pay

## 2019-07-02 DIAGNOSIS — D649 Anemia, unspecified: Secondary | ICD-10-CM

## 2019-07-02 DIAGNOSIS — D500 Iron deficiency anemia secondary to blood loss (chronic): Secondary | ICD-10-CM

## 2019-07-02 NOTE — Telephone Encounter (Signed)
Call to [facility] , spoke with her nurse and the transportation [place]). Patient will be brought to [facility] on Tuesday 07-03-19 to have H&H drawn. If below 7 , patient will need to be admitted to have transfusion.Jacqlyn Larsen in short stay ws made aware also.

## 2019-07-03 LAB — HEMOGLOBIN AND HEMATOCRIT, BLOOD
HCT: 20.7 % — ABNORMAL LOW (ref 35.0–45.0)
Hemoglobin: 6.8 g/dL — ABNORMAL LOW (ref 11.7–15.5)

## 2019-07-04 ENCOUNTER — Encounter (HOSPITAL_COMMUNITY): Payer: Self-pay | Admitting: Emergency Medicine

## 2019-07-04 ENCOUNTER — Other Ambulatory Visit: Payer: Self-pay

## 2019-07-04 ENCOUNTER — Observation Stay (HOSPITAL_COMMUNITY)
Admission: EM | Admit: 2019-07-04 | Discharge: 2019-07-05 | Disposition: A | Discharge status: Home / Self Care | Payer: Medicare Other | Attending: Family Medicine | Admitting: Family Medicine

## 2019-07-04 ENCOUNTER — Telehealth: Payer: Self-pay | Admitting: Physician Assistant

## 2019-07-04 ENCOUNTER — Emergency Department (HOSPITAL_COMMUNITY): Payer: Medicare Other

## 2019-07-04 DIAGNOSIS — Z794 Long term (current) use of insulin: Secondary | ICD-10-CM | POA: Diagnosis not present

## 2019-07-04 DIAGNOSIS — Z79899 Other long term (current) drug therapy: Secondary | ICD-10-CM | POA: Diagnosis not present

## 2019-07-04 DIAGNOSIS — I120 Hypertensive chronic kidney disease with stage 5 chronic kidney disease or end stage renal disease: Secondary | ICD-10-CM | POA: Insufficient documentation

## 2019-07-04 DIAGNOSIS — Z20828 Contact with and (suspected) exposure to other viral communicable diseases: Secondary | ICD-10-CM | POA: Insufficient documentation

## 2019-07-04 DIAGNOSIS — N186 End stage renal disease: Secondary | ICD-10-CM | POA: Insufficient documentation

## 2019-07-04 DIAGNOSIS — F17210 Nicotine dependence, cigarettes, uncomplicated: Secondary | ICD-10-CM | POA: Insufficient documentation

## 2019-07-04 DIAGNOSIS — I10 Essential (primary) hypertension: Secondary | ICD-10-CM

## 2019-07-04 DIAGNOSIS — D500 Iron deficiency anemia secondary to blood loss (chronic): Secondary | ICD-10-CM

## 2019-07-04 DIAGNOSIS — Z992 Dependence on renal dialysis: Secondary | ICD-10-CM | POA: Diagnosis present

## 2019-07-04 DIAGNOSIS — Z85828 Personal history of other malignant neoplasm of skin: Secondary | ICD-10-CM | POA: Insufficient documentation

## 2019-07-04 DIAGNOSIS — E1140 Type 2 diabetes mellitus with diabetic neuropathy, unspecified: Secondary | ICD-10-CM | POA: Diagnosis present

## 2019-07-04 DIAGNOSIS — R0602 Shortness of breath: Secondary | ICD-10-CM | POA: Insufficient documentation

## 2019-07-04 DIAGNOSIS — E1122 Type 2 diabetes mellitus with diabetic chronic kidney disease: Secondary | ICD-10-CM | POA: Insufficient documentation

## 2019-07-04 DIAGNOSIS — K31819 Angiodysplasia of stomach and duodenum without bleeding: Secondary | ICD-10-CM

## 2019-07-04 DIAGNOSIS — E1121 Type 2 diabetes mellitus with diabetic nephropathy: Secondary | ICD-10-CM

## 2019-07-04 DIAGNOSIS — K219 Gastro-esophageal reflux disease without esophagitis: Secondary | ICD-10-CM | POA: Diagnosis present

## 2019-07-04 DIAGNOSIS — D649 Anemia, unspecified: Principal | ICD-10-CM | POA: Insufficient documentation

## 2019-07-04 LAB — BASIC METABOLIC PANEL
Anion gap: 9 (ref 5–15)
BUN: 23 mg/dL (ref 8–23)
CO2: 26 mmol/L (ref 22–32)
Calcium: 8 mg/dL — ABNORMAL LOW (ref 8.9–10.3)
Chloride: 98 mmol/L (ref 98–111)
Creatinine, Ser: 4.09 mg/dL — ABNORMAL HIGH (ref 0.44–1.00)
GFR calc Af Amer: 12 mL/min — ABNORMAL LOW (ref >60)
GFR calc non Af Amer: 10 mL/min — ABNORMAL LOW (ref >60)
Glucose, Bld: 157 mg/dL — ABNORMAL HIGH (ref 70–99)
Potassium: 3.5 mmol/L (ref 3.5–5.1)
Sodium: 133 mmol/L — ABNORMAL LOW (ref 135–145)

## 2019-07-04 LAB — CBG MONITORING, ED: Glucose-Capillary: 81 mg/dL (ref 70–99)

## 2019-07-04 LAB — CBC
HCT: 23.6 % — ABNORMAL LOW (ref 36.0–46.0)
Hemoglobin: 7.2 g/dL — ABNORMAL LOW (ref 12.0–15.0)
MCH: 32.4 pg (ref 26.0–34.0)
MCHC: 30.5 g/dL (ref 30.0–36.0)
MCV: 106.3 fL — ABNORMAL HIGH (ref 80.0–100.0)
Platelets: 157 10*3/uL (ref 150–400)
RBC: 2.22 MIL/uL — ABNORMAL LOW (ref 3.87–5.11)
RDW: 18.6 % — ABNORMAL HIGH (ref 11.5–15.5)
WBC: 4.6 10*3/uL (ref 4.0–10.5)
nRBC: 0 % (ref 0.0–0.2)

## 2019-07-04 LAB — PREPARE RBC (CROSSMATCH)

## 2019-07-04 LAB — GLUCOSE, CAPILLARY: Glucose-Capillary: 116 mg/dL — ABNORMAL HIGH (ref 70–99)

## 2019-07-04 MED ORDER — FUROSEMIDE 10 MG/ML IJ SOLN: 40.0000 mg | Freq: Two times a day (BID) | INTRAMUSCULAR | Status: DC | PRN

## 2019-07-04 MED ORDER — ONDANSETRON HCL 4 MG PO TABS: 4.0000 mg | ORAL_TABLET | Freq: Four times a day (QID) | ORAL | Status: DC | PRN

## 2019-07-04 MED ORDER — INSULIN ASPART 100 UNIT/ML ~~LOC~~ SOLN
0.0000 [IU] | Freq: Three times a day (TID) | SUBCUTANEOUS | Status: DC
  Administered 2019-07-05: 1 [IU] via SUBCUTANEOUS

## 2019-07-04 MED ORDER — GABAPENTIN 300 MG PO CAPS: 300.0000 mg | ORAL_CAPSULE | ORAL | Status: DC

## 2019-07-04 MED ORDER — SERTRALINE HCL 50 MG PO TABS
75.0000 mg | ORAL_TABLET | Freq: Every day | ORAL | Status: DC
  Administered 2019-07-05: 75 mg via ORAL
  Filled 2019-07-04: qty 2

## 2019-07-04 MED ORDER — BUPROPION HCL 75 MG PO TABS
75.0000 mg | ORAL_TABLET | Freq: Every day | ORAL | Status: DC
  Administered 2019-07-05: 75 mg via ORAL
  Filled 2019-07-04 (×2): qty 1

## 2019-07-04 MED ORDER — PANTOPRAZOLE SODIUM 40 MG PO TBEC
40.0000 mg | DELAYED_RELEASE_TABLET | Freq: Two times a day (BID) | ORAL | Status: DC
  Administered 2019-07-04 – 2019-07-05 (×2): 40 mg via ORAL
  Filled 2019-07-04 (×2): qty 1

## 2019-07-04 MED ORDER — GABAPENTIN 300 MG PO CAPS
600.0000 mg | ORAL_CAPSULE | Freq: Every day | ORAL | Status: DC
  Administered 2019-07-04: 600 mg via ORAL
  Filled 2019-07-04: qty 2

## 2019-07-04 MED ORDER — FUROSEMIDE 40 MG PO TABS
40.0000 mg | ORAL_TABLET | Freq: Every day | ORAL | Status: DC
  Administered 2019-07-05: 40 mg via ORAL
  Filled 2019-07-04 (×3): qty 1

## 2019-07-04 MED ORDER — TRAZODONE HCL 50 MG PO TABS
25.0000 mg | ORAL_TABLET | Freq: Every evening | ORAL | Status: DC | PRN
  Administered 2019-07-04: 21:00:00 25 mg via ORAL
  Filled 2019-07-04: qty 1

## 2019-07-04 MED ORDER — SODIUM CHLORIDE 0.9 % IV SOLN
10.0000 mL/h | Freq: Once | INTRAVENOUS | Status: AC
  Administered 2019-07-04: 10 mL/h via INTRAVENOUS

## 2019-07-04 MED ORDER — SEVELAMER CARBONATE 800 MG PO TABS
2400.0000 mg | ORAL_TABLET | Freq: Three times a day (TID) | ORAL | Status: DC
  Administered 2019-07-05 (×2): 2400 mg via ORAL
  Filled 2019-07-04 (×3): qty 3

## 2019-07-04 MED ORDER — POLYVINYL ALCOHOL 1.4 % OP SOLN: 1.0000 [drp] | OPHTHALMIC | Status: DC | PRN

## 2019-07-04 MED ORDER — INSULIN GLARGINE 100 UNIT/ML ~~LOC~~ SOLN
20.0000 [IU] | Freq: Two times a day (BID) | SUBCUTANEOUS | Status: DC
  Administered 2019-07-05: 20 [IU] via SUBCUTANEOUS
  Filled 2019-07-04 (×4): qty 0.2

## 2019-07-04 MED ORDER — PANTOPRAZOLE SODIUM 40 MG IV SOLR: 40.0000 mg | Freq: Two times a day (BID) | INTRAVENOUS | Status: DC

## 2019-07-04 MED ORDER — ONDANSETRON HCL 4 MG/2ML IJ SOLN: 4.0000 mg | Freq: Four times a day (QID) | INTRAMUSCULAR | Status: DC | PRN

## 2019-07-04 MED ORDER — GABAPENTIN 300 MG PO CAPS
300.0000 mg | ORAL_CAPSULE | Freq: Two times a day (BID) | ORAL | Status: DC
  Administered 2019-07-05 (×2): 300 mg via ORAL
  Filled 2019-07-04 (×2): qty 1

## 2019-07-04 MED ORDER — ACETAMINOPHEN 325 MG PO TABS
650.0000 mg | ORAL_TABLET | Freq: Four times a day (QID) | ORAL | Status: DC | PRN
  Administered 2019-07-04 – 2019-07-05 (×2): 650 mg via ORAL
  Filled 2019-07-04 (×3): qty 2

## 2019-07-04 MED ORDER — METHYLPREDNISOLONE SODIUM SUCC 125 MG IJ SOLR: 80.0000 mg | Freq: Once | INTRAMUSCULAR | Status: DC

## 2019-07-04 MED ORDER — SEVELAMER CARBONATE 800 MG PO TABS: 1600.0000 mg | ORAL_TABLET | ORAL | Status: DC

## 2019-07-04 MED ORDER — INSULIN ASPART 100 UNIT/ML ~~LOC~~ SOLN: 0.0000 [IU] | Freq: Every day | SUBCUTANEOUS | Status: DC

## 2019-07-04 MED ORDER — SEVELAMER CARBONATE 800 MG PO TABS
1600.0000 mg | ORAL_TABLET | Freq: Two times a day (BID) | ORAL | Status: DC | PRN
  Filled 2019-07-04: qty 2

## 2019-07-04 MED ORDER — SEVELAMER CARBONATE 800 MG PO TABS
1600.0000 mg | ORAL_TABLET | Freq: Two times a day (BID) | ORAL | Status: DC
  Administered 2019-07-05: 1600 mg via ORAL
  Filled 2019-07-04: qty 2

## 2019-07-04 MED ORDER — ALPRAZOLAM 0.25 MG PO TABS
0.2500 mg | ORAL_TABLET | Freq: Two times a day (BID) | ORAL | Status: DC
  Administered 2019-07-04 – 2019-07-05 (×2): 0.25 mg via ORAL
  Filled 2019-07-04 (×2): qty 1

## 2019-07-04 NOTE — ED Triage Notes (Signed)
Patient states she was contacted by the doctor and told to come to the ED for a transfusion.

## 2019-07-04 NOTE — Telephone Encounter (Signed)
Patient had lab work on 07/03/19 her Hgb was 6.8. Dr.Rehman was made aware. He ask that the patient be brought to the ED so that she may be evaluated and get transfused.  [facility] was called and I spoke with Tidus Upchurch and then to [facility].  They say that the patient is currently in dialysis and when she is picked up , they will bring her to the ED.

## 2019-07-04 NOTE — Telephone Encounter (Signed)
Aware not are patient we can not refill this.

## 2019-07-04 NOTE — ED Notes (Signed)
Pt given meal tray after CBG resulted.

## 2019-07-04 NOTE — H&P (Signed)
TRH H&P   Patient Demographics:    Tanya Lucero, is a 68 y.o. female  MRN: [id number]   DOB - Nov 15, 1950  Admit Date - 07/04/2019  Outpatient Primary MD for the patient is Scotty Court, DO  Referring MD/NP/PA: Dr Alvino Chapel  Patient coming from: HD center, she live at [facility].  Chief Complaint  Patient presents with  . Anemia      HPI:    Tanya Lucero  is a 68 y.o. female, with a history of ESRD on hemodialysis, DM2, HTN, history of slow GI bleed with chronic anemia requiring multiple blood transfusions, lives at [facility]	[place] [facility] facility, patient finished her hemodialysis today, entered was called by Dr. Rosina Lowenstein office, they were notified her hemoglobin was 6.8, and they recommended to transfer to ED, Laurey Arrow hemoglobin in ED was 7.2, patient reports dyspnea at baseline, report generalized weakness, but she denies any cough, chest pain, hemoptysis, change of color in her stools, denies any bright red blood per rectum, patient with known history of gave, followed by Dr. Shelva Majestic, required multiple transfusions in the past. - in ED patient pressures of 92/40, hemoglobin 7.2, she was ordered 2 units PRBC transfusion, I was called to admit for further evaluation.    Review of systems:    In addition to the HPI above,  No Fever-chills, reports generalized weakness at baseline No Headache, No changes with Vision or hearing, No problems swallowing food or Liquids, No Chest pain, Cough, she reports dyspnea at baseline No Abdominal pain, No Nausea or Vommitting, Bowel movements are regular, No Blood in stool or Urine, No dysuria, No new skin rashes or bruises, No new joints pains-aches,  No new weakness, tingling, numbness in any extremity, No recent weight gain or loss, No polyuria, polydypsia or polyphagia, No significant Mental Stressors.  A full 10  point Review of Systems was done, except as stated above, all other Review of Systems were negative.   With Past History of the following :    Past Medical History:  Diagnosis Date  . Anemia   . Anxiety   . Arthritis   . Chronic kidney disease    neuphrotic syndrome-05/2013  . Constipation   . Decreased vision    left eye  . Depression   . Diabetes mellitus    Type 2  . Diabetic neuropathy ([place])   . Diverticulitis   . Dry skin   . GERD (gastroesophageal reflux disease)   . GI bleed 07/05/2017  . Gout   . Headache   . Hypertension   . Neuropathy   . Shortness of breath dyspnea   . Skin cancer       Past Surgical History:  Procedure Laterality Date  . ABDOMINAL HYSTERECTOMY    . ADENOIDECTOMY    . APPENDECTOMY     taken out with hysterectomy  . AV FISTULA PLACEMENT Right 09/13/2014  Procedure: Creation of Right Arm RADIOCEPHALIC ARTERIOVENOUS (AV) FISTULA ;  Surgeon: [name], MD;  Location: [facility];  Service: Vascular;  Laterality: Right;  . BACK SURGERY     lumbar -   . CATARACT EXTRACTION W/PHACO Left 07/02/2013   Procedure: CATARACT EXTRACTION PHACO AND INTRAOCULAR LENS PLACEMENT (IOC);  Surgeon: [name], MD;  Location: [facility];  Service: Ophthalmology;  Laterality: Left;  CDE:  6.46  . CATARACT EXTRACTION W/PHACO Right 07/12/2013   Procedure: CATARACT EXTRACTION PHACO AND INTRAOCULAR LENS PLACEMENT (IOC);  Surgeon: [name], MD;  Location: [facility];  Service: Ophthalmology;  Laterality: Right;  CDE:12.48  . COLONOSCOPY N/A 12/11/2015   Procedure: COLONOSCOPY;  Surgeon: [name], MD;  [facility];  Service: Endoscopy;  Laterality: N/A;  1:55  . ESOPHAGOGASTRODUODENOSCOPY N/A 03/11/2016   Procedure: ESOPHAGOGASTRODUODENOSCOPY (EGD);  Surgeon: [name], MD;  [facility];  Service: Endoscopy;  Laterality: N/A;  1230 - Last pt of the day-Dr. Laural Golden going to office to see pt's  . ESOPHAGOGASTRODUODENOSCOPY N/A 07/21/2017   Procedure:  ESOPHAGOGASTRODUODENOSCOPY (EGD);  Surgeon: [name], MD;  [facility];  Service: Endoscopy;  Laterality: N/A;  3:00  . ESOPHAGOGASTRODUODENOSCOPY N/A 11/30/2018   Procedure: ESOPHAGOGASTRODUODENOSCOPY (EGD);  Surgeon: [name], MD;  [facility];  Service: Endoscopy;  Laterality: N/A;  9:55 - spoke with the facility and advised new arrival time 10:20  . ESOPHAGOGASTRODUODENOSCOPY N/A 01/11/2019   Procedure: ESOPHAGOGASTRODUODENOSCOPY (EGD);  Surgeon: [name], MD;  [facility];  Service: Endoscopy;  Laterality: N/A;  155 - office spoke with facility and they know to have pt here at 11:00  . FOOT SURGERY Right    x2-heel spur   . GIVENS CAPSULE STUDY N/A 11/09/2016   Procedure: GIVENS CAPSULE STUDY;  Surgeon: [name], MD;  [facility];  Service: Endoscopy;  Laterality: N/A;  7:30  . GIVENS CAPSULE STUDY N/A 11/10/2018   Procedure: GIVENS CAPSULE STUDY;  Surgeon: [name], MD;  [facility];  Service: Endoscopy;  Laterality: N/A;  . REVISON OF ARTERIOVENOUS FISTULA Right 01/05/2016   Procedure: REVISON OF ARTERIOVENOUS FISTULA- right forearm;  Surgeon: [name], MD;  Location: [facility];  Service: Vascular;  Laterality: Right;  . SKIN CANCER EXCISION     forehead  . TONSILLECTOMY        Social History:     Social History   Tobacco Use  . Smoking status: Current Every Day Smoker    Packs/day: 0.25    Years: 40.00    Pack years: 10.00    Types: Cigarettes  . Smokeless tobacco: Never Used  Substance Use Topics  . Alcohol use: No    Alcohol/week: 0.0 standard drinks       Family History :     Family History  Problem Relation Age of Onset  . Multiple myeloma Mother   . Diabetes Mother   . Cirrhosis Mother   . Heart attack Father 38  . Colon cancer Neg Hx       Home Medications:   Prior to Admission medications   Medication Sig Start Date End Date Taking? Authorizing Provider   acetaminophen (TYLENOL) 500 MG tablet Take 1,000 mg by mouth every 6 (six) hours as needed for mild pain (FOR HEADACHES/MINOR DISCOMFORT).    Yes [provider]  ALPRAZolam (XANAX) 0.25 MG tablet Take 0.25 mg by mouth 2 (two) times  daily.    Yes [provider]  aluminum-magnesium hydroxide-simethicone (MAALOX) 200-200-20 MG/5ML SUSP Take 30 mLs by mouth 4 (four) times daily as needed (heartburn/indigestion).   Yes [provider]  amLODipine (NORVASC) 5 MG tablet Take 1 tablet (5 mg total) by mouth at bedtime. 02/28/19  Yes Emokpae, Courage, MD  anti-nausea (EMETROL) solution Take 30 mLs every 15 (fifteen) minutes as needed by mouth for nausea or vomiting (X4 doses only).   Yes [provider]  b complex vitamins capsule Take 1 capsule by mouth daily.    Yes [provider]  buPROPion (WELLBUTRIN) 75 MG tablet Take 75 mg by mouth daily.    Yes [provider]  carvedilol (COREG) 6.25 MG tablet Take 1 tablet (6.25 mg total) by mouth 2 (two) times a day. 02/28/19  Yes Emokpae, Courage, MD  cholecalciferol (VITAMIN D3) 25 MCG (1000 UT) tablet Take 1,000 Units by mouth daily.   Yes [provider]  fexofenadine (ALLEGRA) 180 MG tablet Take 180 mg by mouth daily.    Yes [provider]  furosemide (LASIX) 20 MG tablet Take 1 tablet (20 mg total) by mouth at bedtime as needed for fluid or edema (swelling). Patient taking differently: Take 40 mg by mouth daily.  02/28/19  Yes Emokpae, Courage, MD  gabapentin (NEURONTIN) 300 MG capsule Take 600 mg by mouth at bedtime.    Yes [provider]  gabapentin (NEURONTIN) 300 MG capsule Take 300 mg by mouth See admin instructions. Take 300 mg daily at 6am and at 2pm.   Yes [provider]  glipiZIDE (GLUCOTROL) 5 MG tablet Take 1 tablet (5 mg total) by mouth 2 (two) times a day. 02/28/19  Yes Emokpae, Courage, MD  guaifenesin (ROBITUSSIN) 100 MG/5ML syrup Take 200 mg every 6  (six) hours as needed by mouth for cough.   Yes [provider]  hydroxypropyl methylcellulose / hypromellose (ISOPTO TEARS / GONIOVISC) 2.5 % ophthalmic solution Place 1 drop into both eyes 4 (four) times daily as needed for dry eyes.   Yes [provider]  insulin lispro (HUMALOG) 100 UNIT/ML KiwkPen Inject 10 Units into the skin 3 (three) times daily before meals. *Do Not to inject for levels below 100   Yes [provider]  loperamide (IMODIUM) 2 MG capsule Take 1 capsule (2 mg total) by mouth 3 (three) times daily before meals. Patient taking differently: Take 2 mg by mouth daily as needed for diarrhea or loose stools.  06/05/19  Yes Rehman, Mechele Dawley, MD  Melatonin 5 MG CAPS Take 10 mg by mouth at bedtime.    Yes [provider]  pantoprazole (PROTONIX) 40 MG tablet Take 1 tablet (40 mg total) by mouth 2 (two) times daily. 11/10/18  Yes Barton Dubois, MD  sertraline (ZOLOFT) 50 MG tablet Take 75 mg by mouth daily.    Yes [provider]  sevelamer carbonate (RENVELA) 800 MG tablet Take 1,600-2,400 mg by mouth See admin instructions. 2400 mg 3 times daily with meals and 1600 mg twice daily with snacks   Yes [provider]  TRESIBA FLEXTOUCH 100 UNIT/ML SOPN FlexTouch Pen Inject 50 Units into the skin every morning.  06/26/19  Yes [provider]     Allergies:     Allergies  Allergen Reactions  . Penicillins Rash    Did it involve swelling of the face/tongue/throat, SOB, or low BP? Unknown Did it involve sudden or severe rash/hives, skin peeling, or any reaction on  the inside of your mouth or nose? Unknown Did you need to seek medical attention at a hospital or doctor's office? Unknown When did it last happen?unknown If all above answers are "NO", may proceed with cephalosporin use.       Physical Exam:   Vitals  Blood pressure (!) 135/56, pulse 65, temperature 98 F (36.7 C), temperature source Oral, resp. rate  14, height 5' 2" (1.575 m), weight 98.9 kg, SpO2 94 %.   1. General well developed female, lying in bed in NAD,    2. Normal affect and insight, Not Suicidal or Homicidal, Awake Alert, Oriented X 3.  3. No F.N deficits, ALL C.Nerves Intact, Strength 5/5 all 4 extremities, Sensation intact all 4 extremities, Plantars down going.  4. Ears and Eyes appear Normal, Conjunctivae clear, PERRLA. Moist Oral Mucosa.  5. Supple Neck, No JVD, No cervical lymphadenopathy appriciated, No Carotid Bruits.  6. Symmetrical Chest wall movement, Good air movement bilaterally, CTAB.  7. RRR, No Gallops, Rubs or Murmurs, No Parasternal Heave.  8. Positive Bowel Sounds, Abdomen Soft, No tenderness, No organomegaly appriciated,No rebound -guarding or rigidity.  9.  No Cyanosis, Normal Skin Turgor, No Skin Rash or Bruise.  10. Good muscle tone,  joints appear normal , no effusions, Normal ROM.  11. No Palpable Lymph Nodes in Neck or Axillae    Data Review:    CBC Recent Labs  Lab 07/03/19 1005 07/04/19 1637  WBC  --  4.6  HGB 6.8* 7.2*  HCT 20.7* 23.6*  PLT  --  157  MCV  --  106.3*  MCH  --  32.4  MCHC  --  30.5  RDW  --  18.6*   ------------------------------------------------------------------------------------------------------------------  Chemistries  Recent Labs  Lab 07/04/19 1637  NA 133*  K 3.5  CL 98  CO2 26  GLUCOSE 157*  BUN 23  CREATININE 4.09*  CALCIUM 8.0*   ------------------------------------------------------------------------------------------------------------------ estimated creatinine clearance is 14.3 mL/min (A) (by C-G formula based on SCr of 4.09 mg/dL (H)). ------------------------------------------------------------------------------------------------------------------ No results for input(s): TSH, T4TOTAL, T3FREE, THYROIDAB in the last 72 hours.  Invalid input(s): FREET3  Coagulation profile No results for input(s): INR, PROTIME in the last 168  hours. ------------------------------------------------------------------------------------------------------------------- No results for input(s): DDIMER in the last 72 hours. -------------------------------------------------------------------------------------------------------------------  Cardiac Enzymes No results for input(s): CKMB, TROPONINI, MYOGLOBIN in the last 168 hours.  Invalid input(s): CK ------------------------------------------------------------------------------------------------------------------ No results found for: BNP   ---------------------------------------------------------------------------------------------------------------  Urinalysis No results found for: COLORURINE, APPEARANCEUR, LABSPEC, [place], GLUCOSEU, HGBUR, BILIRUBINUR, KETONESUR, PROTEINUR, UROBILINOGEN, NITRITE, LEUKOCYTESUR  ----------------------------------------------------------------------------------------------------------------   Imaging Results:    Dg Chest Portable 1 View  Result Date: 07/04/2019 CLINICAL DATA:  Initial evaluation for acute shortness of breath. EXAM: PORTABLE CHEST 1 VIEW COMPARISON:  Prior radiograph from 04/26/2019. FINDINGS: Cardiomegaly, stable.  Mediastinal silhouette within normal limits. Lungs hypoinflated. No focal infiltrates. No edema or effusion. No pneumothorax. No acute osseous finding. IMPRESSION: 1. No active cardiopulmonary disease. 2. Cardiomegaly, stable. Electronically Signed   By: Jeannine Boga M.D.   On: 07/04/2019 18:31      Assessment & Plan:    Active Problems:   Type 2 diabetes with nephropathy (HCC)   Essential hypertension   Shortness of breath   Symptomatic anemia   ESRD (end stage renal disease) (HCC)   Diabetic neuropathy (HCC)   GAVE (gastric antral vascular ectasia)   GERD (gastroesophageal reflux disease)   Anemia due to chronic blood loss   Symptomatic Anemia/chronic blood loss anemia/Hx of  GAVE -Patient was sent  by GI for hemoglobin of 6.8, denies any change in the color of her stool, usually on the dark side, denies any bright red blood per rectum, patient with known history of GAVE, followed by GI Dr. Laural Golden. -Obtain Hemoccult, if positive will consult GI -When she is symptomatic with dyspnea, will transfuse 2 units PRBC. -Keep on Protonix 40 mg IV twice daily. -Patient to be transfused 2 units PRBC, she is still making urine, will give 40 mg of IV Lasix after each unit to avoid volume overload.  ESRD -Patient followed by Dr. Lowanda Foster at dialysis center, she has not dialysis Monday Wednesday Friday, she completed her session today, no evidence of volume overload, no indication for emergent dialysis, if patient will remain till Friday, then will have rounding team consult renal. -Consult nephrology if the patient stays beyond 07/05/2019 -Continue Renvela  Essential hypertension -Soft blood pressure on presentation, resume Coreg and amlodipine if elevated  Diabetes mellitus type 2 -Will resume lower dose Lantus, will hold glipizide, will continue with insulin sliding scale  Depression/anxiety -Continue home meds    DVT Prophylaxis - SCDs  AM Labs Ordered, also please review Full Orders  Family Communication: Admission, patients condition and plan of care including tests being ordered have been discussed with the patient who indicate understanding and agree with the plan and Code Status.  Code Status Full  Likely DC to  Home  Condition GUARDED    Consults called: None  Admission status:  Observation  Time spent in minutes : 55 mi nutes   Phillips Climes M.D on 07/04/2019 at 8:46 PM  Between 7am to 7pm - Pager - [phone]. After 7pm go to [email or web address] - password [email or web address]  [facility] - Office  [phone]

## 2019-07-04 NOTE — ED Provider Notes (Signed)
[facility] EMERGENCY DEPARTMENT Provider Note   CSN: [id number] Arrival date & time: 07/04/19  1600     History   Chief Complaint Chief Complaint  Patient presents with  . Anemia    HPI Tanya Lucero is a 68 y.o. female.     HPI Patient sent in for anemia.  Reported hemoglobin of 6.7.  Chronic anemia.  Also end-stage renal disease on dialysis.  States that she thinks her stool is dark but really cannot see well and really does not check it.  History of previous GI bleeds.  Hemoglobin was drawn yesterday under order from Dr. Laural Golden.  States she feels short of breath but always feels short of breath.  Has some swelling in her legs also.  She is a dialysis patient was dialyzed today.  States she is still caring some extra fluid.  States she feels as if she is carrying extra fluid overall. Past Medical History:  Diagnosis Date  . Anemia   . Anxiety   . Arthritis   . Chronic kidney disease    neuphrotic syndrome-05/2013  . Constipation   . Decreased vision    left eye  . Depression   . Diabetes mellitus    Type 2  . Diabetic neuropathy ([place])   . Diverticulitis   . Dry skin   . GERD (gastroesophageal reflux disease)   . GI bleed 07/05/2017  . Gout   . Headache   . Hypertension   . Neuropathy   . Shortness of breath dyspnea   . Skin cancer     Patient Active Problem List   Diagnosis Date Noted  . GERD (gastroesophageal reflux disease) 06/05/2019  . Anemia due to chronic blood loss 06/05/2019  . 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 Heme positive stool 11/20/2018  . Symptomatic anemia 11/09/2018  . ESRD (end stage renal disease) ([place]) 11/09/2018  . Anxiety and depression 11/09/2018  . Secondary hyperparathyroidism of renal origin ([place]) 11/09/2018  . Diabetic neuropathy ([place]) 11/09/2018  . Guaiac positive stools 07/19/2017  . GI bleed 07/05/2017  . Iron deficiency anemia due to chronic blood loss 10/27/2016  . RENAL INSUFFICIENCY 03/04/2010  .  Type 2 diabetes with nephropathy ([place]) 02/17/2010  . Essential hypertension 02/17/2010  . OTHER MALAISE AND FATIGUE 02/17/2010  . Shortness of breath 02/17/2010  . CHEST PAIN, PRECORDIAL 02/17/2010    Past Surgical History:  Procedure Laterality Date  . ABDOMINAL HYSTERECTOMY    . ADENOIDECTOMY    . APPENDECTOMY     taken out with hysterectomy  . AV FISTULA PLACEMENT Right 09/13/2014   Procedure: Creation of Right Arm RADIOCEPHALIC ARTERIOVENOUS (AV) FISTULA ;  Surgeon: [name], MD;  Location: [facility];  Service: Vascular;  Laterality: Right;  . BACK SURGERY     lumbar -   . CATARACT EXTRACTION W/PHACO Left 07/02/2013   Procedure: CATARACT EXTRACTION PHACO AND INTRAOCULAR LENS PLACEMENT (IOC);  Surgeon: [name], MD;  Location: [facility];  Service: Ophthalmology;  Laterality: Left;  CDE:  6.46  . CATARACT EXTRACTION W/PHACO Right 07/12/2013   Procedure: CATARACT EXTRACTION PHACO AND INTRAOCULAR LENS PLACEMENT (IOC);  Surgeon: [name], MD;  Location: [facility];  Service: Ophthalmology;  Laterality: Right;  CDE:12.48  . COLONOSCOPY N/A 12/11/2015   Procedure: COLONOSCOPY;  Surgeon: [name], MD;  [facility];  Service: Endoscopy;  Laterality: N/A;  1:55  . ESOPHAGOGASTRODUODENOSCOPY N/A 03/11/2016   Procedure: ESOPHAGOGASTRODUODENOSCOPY (EGD);  Surgeon: [name], MD;  [facility];  Service: Endoscopy;  Laterality: N/A;  1230 - Last pt of the day-Dr. Laural Golden going to office to see pt's  . ESOPHAGOGASTRODUODENOSCOPY N/A 07/21/2017   Procedure: ESOPHAGOGASTRODUODENOSCOPY (EGD);  Surgeon: [name], MD;  [facility];  Service: Endoscopy;  Laterality: N/A;  3:00  . ESOPHAGOGASTRODUODENOSCOPY N/A 11/30/2018   Procedure: ESOPHAGOGASTRODUODENOSCOPY (EGD);  Surgeon: [name], MD;  [facility];  Service: Endoscopy;  Laterality: N/A;  9:55 - spoke with the facility and advised new arrival time 10:20  . ESOPHAGOGASTRODUODENOSCOPY N/A  01/11/2019   Procedure: ESOPHAGOGASTRODUODENOSCOPY (EGD);  Surgeon: [name], MD;  [facility];  Service: Endoscopy;  Laterality: N/A;  155 - office spoke with facility and they know to have pt here at 11:00  . FOOT SURGERY Right    x2-heel spur   . GIVENS CAPSULE STUDY N/A 11/09/2016   Procedure: GIVENS CAPSULE STUDY;  Surgeon: [name], MD;  [facility];  Service: Endoscopy;  Laterality: N/A;  7:30  . GIVENS CAPSULE STUDY N/A 11/10/2018   Procedure: GIVENS CAPSULE STUDY;  Surgeon: [name], MD;  [facility];  Service: Endoscopy;  Laterality: N/A;  . REVISON OF ARTERIOVENOUS FISTULA Right 01/05/2016   Procedure: REVISON OF ARTERIOVENOUS FISTULA- right forearm;  Surgeon: [name], MD;  Location: [facility];  Service: Vascular;  Laterality: Right;  . SKIN CANCER EXCISION     forehead  . TONSILLECTOMY       OB History    Gravida      Para      Term      Preterm      AB      Living  0     SAB      TAB      Ectopic      Multiple      Live Births               Home Medications    Prior to Admission medications   Medication Sig Start Date End Date Taking? Authorizing Provider  acetaminophen (TYLENOL) 500 MG tablet Take 1,000 mg by mouth every 6 (six) hours as needed for mild pain (FOR HEADACHES/MINOR DISCOMFORT).    Yes [provider]  ALPRAZolam (XANAX) 0.25 MG tablet Take 0.25 mg by mouth 2 (two) times daily.    Yes [provider]  aluminum-magnesium hydroxide-simethicone (MAALOX) 200-200-20 MG/5ML SUSP Take 30 mLs by mouth 4 (four) times daily as needed (heartburn/indigestion).   Yes [provider]  amLODipine (NORVASC) 5 MG tablet Take 1 tablet (5 mg total) by mouth at bedtime. 02/28/19  Yes Emokpae, Courage, MD  anti-nausea (EMETROL) solution Take 30 mLs every 15 (fifteen) minutes as needed by mouth for nausea or vomiting (X4 doses only).   Yes [provider]  b complex  vitamins capsule Take 1 capsule by mouth daily.    Yes [provider]  buPROPion (WELLBUTRIN) 75 MG tablet Take 75 mg by mouth daily.    Yes [provider]  carvedilol (COREG) 6.25 MG tablet Take 1 tablet (6.25 mg total) by mouth 2 (two) times a day. 02/28/19  Yes Emokpae, Courage, MD  cholecalciferol (VITAMIN D3) 25 MCG (1000 UT) tablet Take 1,000 Units by mouth daily.   Yes [provider]  fexofenadine (ALLEGRA) 180 MG tablet Take 180 mg by mouth daily.    Yes [provider]  furosemide (LASIX) 20 MG  tablet Take 1 tablet (20 mg total) by mouth at bedtime as needed for fluid or edema (swelling). Patient taking differently: Take 40 mg by mouth daily.  02/28/19  Yes Emokpae, Courage, MD  gabapentin (NEURONTIN) 300 MG capsule Take 600 mg by mouth at bedtime.    Yes [provider]  gabapentin (NEURONTIN) 300 MG capsule Take 300 mg by mouth See admin instructions. Take 300 mg daily at 6am and at 2pm.   Yes [provider]  glipiZIDE (GLUCOTROL) 5 MG tablet Take 1 tablet (5 mg total) by mouth 2 (two) times a day. 02/28/19  Yes Emokpae, Courage, MD  guaifenesin (ROBITUSSIN) 100 MG/5ML syrup Take 200 mg every 6 (six) hours as needed by mouth for cough.   Yes [provider]  hydroxypropyl methylcellulose / hypromellose (ISOPTO TEARS / GONIOVISC) 2.5 % ophthalmic solution Place 1 drop into both eyes 4 (four) times daily as needed for dry eyes.   Yes [provider]  insulin lispro (HUMALOG) 100 UNIT/ML KiwkPen Inject 10 Units into the skin 3 (three) times daily before meals. *Do Not to inject for levels below 100   Yes [provider]  loperamide (IMODIUM) 2 MG capsule Take 1 capsule (2 mg total) by mouth 3 (three) times daily before meals. Patient taking differently: Take 2 mg by mouth daily as needed for diarrhea or loose stools.  06/05/19  Yes Rehman, Mechele Dawley, MD  Melatonin 5 MG CAPS Take 10 mg by mouth at bedtime.    Yes  [provider]  pantoprazole (PROTONIX) 40 MG tablet Take 1 tablet (40 mg total) by mouth 2 (two) times daily. 11/10/18  Yes Barton Dubois, MD  sertraline (ZOLOFT) 50 MG tablet Take 75 mg by mouth daily.    Yes [provider]  sevelamer carbonate (RENVELA) 800 MG tablet Take 1,600-2,400 mg by mouth See admin instructions. 2400 mg 3 times daily with meals and 1600 mg twice daily with snacks   Yes [provider]  TRESIBA FLEXTOUCH 100 UNIT/ML SOPN FlexTouch Pen Inject 50 Units into the skin every morning.  06/26/19  Yes [provider]    Family History Family History  Problem Relation Age of Onset  . Multiple myeloma Mother   . Diabetes Mother   . Cirrhosis Mother   . Heart attack Father 74  . Colon cancer Neg Hx     Social History Social History   Tobacco Use  . Smoking status: Current Every Day Smoker    Packs/day: 0.25    Years: 40.00    Pack years: 10.00    Types: Cigarettes  . Smokeless tobacco: Never Used  Substance Use Topics  . Alcohol use: No    Alcohol/week: 0.0 standard drinks  . Drug use: No     Allergies   Penicillins   Review of Systems Review of Systems  Constitutional: Positive for appetite change and fatigue. Negative for fever.  HENT: Negative for congestion.   Respiratory: Positive for shortness of breath.   Cardiovascular: Positive for leg swelling. Negative for chest pain.  Gastrointestinal: Negative for abdominal pain.  Genitourinary: Negative for flank pain.  Musculoskeletal: Negative for back pain.  Neurological: Negative for weakness.     Physical Exam Updated Vital Signs BP (!) 140/54 (BP Location: Left Arm)   Pulse 66   Temp 98.1 F (36.7 C) (Oral)   Resp 18   Ht '5\' 2"'  (1.575 m)   Wt 98.9 kg   SpO2 98%   BMI 39.87  kg/m   Physical Exam Vitals signs and nursing note reviewed.  HENT:     Head: Normocephalic.  Eyes:     General: No scleral icterus. Cardiovascular:     Rate and Rhythm:  Regular rhythm.  Pulmonary:     Breath sounds: Rales present.     Comments: Some rales at right base. Abdominal:     Tenderness: There is no abdominal tenderness.  Musculoskeletal:     Right lower leg: Edema present.     Left lower leg: Edema present.     Comments: Pitting edema bilateral lower extremities.  Skin:    Capillary Refill: Capillary refill takes less than 2 seconds.     Coloration: Skin is not jaundiced.  Neurological:     Mental Status: She is alert and oriented to person, place, and time.  Psychiatric:        Mood and Affect: Mood normal.      ED Treatments / Results  Labs (all labs ordered are listed, but only abnormal results are displayed) Labs Reviewed  CBC - Abnormal; Notable for the following components:      Result Value   RBC 2.22 (*)    Hemoglobin 7.2 (*)    HCT 23.6 (*)    MCV 106.3 (*)    RDW 18.6 (*)    All other components within normal limits  BASIC METABOLIC PANEL - Abnormal; Notable for the following components:   Sodium 133 (*)    Glucose, Bld 157 (*)    Creatinine, Ser 4.09 (*)    Calcium 8.0 (*)    GFR calc non Af Amer 10 (*)    GFR calc Af Amer 12 (*)    All other components within normal limits  GLUCOSE, CAPILLARY - Abnormal; Notable for the following components:   Glucose-Capillary 116 (*)    All other components within normal limits  SARS CORONAVIRUS 2 (TAT 6-24 HRS)  CBC  COMPREHENSIVE METABOLIC PANEL  PROTIME-INR  CBG MONITORING, ED  TYPE AND SCREEN  PREPARE RBC (CROSSMATCH)    EKG EKG Interpretation  Date/Time:  Wednesday July 04 2019 17:12:02 EDT Ventricular Rate:  69 PR Interval:    QRS Duration: 102 QT Interval:  403 QTC Calculation: 432 R Axis:   2 Text Interpretation: Sinus rhythm LVH with secondary repolarization abnormality Anterior Q waves, possibly due to LVH Confirmed by Davonna Belling [id number]) on 07/04/2019 5:55:58 PM   Radiology Dg Chest Portable 1 View  Result Date: 07/04/2019 CLINICAL  DATA:  Initial evaluation for acute shortness of breath. EXAM: PORTABLE CHEST 1 VIEW COMPARISON:  Prior radiograph from 04/26/2019. FINDINGS: Cardiomegaly, stable.  Mediastinal silhouette within normal limits. Lungs hypoinflated. No focal infiltrates. No edema or effusion. No pneumothorax. No acute osseous finding. IMPRESSION: 1. No active cardiopulmonary disease. 2. Cardiomegaly, stable. Electronically Signed   By: Jeannine Boga M.D.   On: 07/04/2019 18:31    Procedures Procedures (including critical care time)  Medications Ordered in ED Medications  furosemide (LASIX) tablet 40 mg (40 mg Oral Not Given 07/04/19 2109)  ALPRAZolam Duanne Moron) tablet 0.25 mg (0.25 mg Oral Given 07/04/19 2114)  buPROPion [facility]) tablet 75 mg (75 mg Oral Not Given 07/04/19 2113)  sertraline (ZOLOFT) tablet 75 mg (75 mg Oral Not Given 07/04/19 2113)  traZODone (DESYREL) tablet 25 mg (25 mg Oral Given 07/04/19 2114)  insulin glargine (LANTUS) injection 20 Units (20 Units Subcutaneous Not Given 07/04/19 2128)  pantoprazole (PROTONIX) EC tablet 40 mg (40 mg Oral Given 07/04/19 2115)  gabapentin (NEURONTIN) capsule 600 mg (600 mg Oral Given 07/04/19 2116)  polyvinyl alcohol (LIQUIFILM TEARS) 1.4 % ophthalmic solution 1 drop (has no administration in time range)  ondansetron (ZOFRAN) tablet 4 mg (has no administration in time range)    Or  ondansetron (ZOFRAN) injection 4 mg (has no administration in time range)  acetaminophen (TYLENOL) tablet 650 mg (650 mg Oral Given 07/04/19 2113)  insulin aspart (novoLOG) injection 0-9 Units (has no administration in time range)  insulin aspart (novoLOG) injection 0-5 Units (0 Units Subcutaneous Not Given 07/04/19 2234)  furosemide (LASIX) injection 40 mg (has no administration in time range)  sevelamer carbonate (RENVELA) tablet 1,600 mg (has no administration in time range)  gabapentin (NEURONTIN) capsule 300 mg (has no administration in time range)  sevelamer  carbonate (RENVELA) tablet 2,400 mg (has no administration in time range)  sevelamer carbonate (RENVELA) tablet 1,600 mg (has no administration in time range)  0.9 %  sodium chloride infusion (10 mL/hr Intravenous Transfusing/Transfer 07/04/19 2138)     Initial Impression / Assessment and Plan / ED Course  I have reviewed the triage vital signs and the nursing notes.  Pertinent labs & imaging results that were available during my care of the patient were reviewed by me and considered in my medical decision making (see chart for details).        Patient with anemia.  History of same.  Sent in for transfusion.  Hemoglobin decreased.  Also dialysis patient  Will admit.  Final Clinical Impressions(s) / ED Diagnoses   Final diagnoses:  Symptomatic anemia    ED Discharge Orders    None       Davonna Belling, MD 07/04/19 2338

## 2019-07-04 NOTE — ED Notes (Signed)
Pt given food tray and drink.  

## 2019-07-05 DIAGNOSIS — D649 Anemia, unspecified: Secondary | ICD-10-CM | POA: Diagnosis not present

## 2019-07-05 DIAGNOSIS — E1142 Type 2 diabetes mellitus with diabetic polyneuropathy: Secondary | ICD-10-CM

## 2019-07-05 DIAGNOSIS — I10 Essential (primary) hypertension: Secondary | ICD-10-CM | POA: Diagnosis not present

## 2019-07-05 DIAGNOSIS — N186 End stage renal disease: Secondary | ICD-10-CM | POA: Diagnosis not present

## 2019-07-05 DIAGNOSIS — K31819 Angiodysplasia of stomach and duodenum without bleeding: Secondary | ICD-10-CM | POA: Diagnosis not present

## 2019-07-05 DIAGNOSIS — K2100 Gastro-esophageal reflux disease with esophagitis, without bleeding: Secondary | ICD-10-CM

## 2019-07-05 LAB — CBC
HCT: 30.2 % — ABNORMAL LOW (ref 36.0–46.0)
Hemoglobin: 9.4 g/dL — ABNORMAL LOW (ref 12.0–15.0)
MCH: 30.4 pg (ref 26.0–34.0)
MCHC: 31.1 g/dL (ref 30.0–36.0)
MCV: 97.7 fL (ref 80.0–100.0)
Platelets: 129 10*3/uL — ABNORMAL LOW (ref 150–400)
RBC: 3.09 MIL/uL — ABNORMAL LOW (ref 3.87–5.11)
RDW: 21.2 % — ABNORMAL HIGH (ref 11.5–15.5)
WBC: 3.8 10*3/uL — ABNORMAL LOW (ref 4.0–10.5)
nRBC: 0 % (ref 0.0–0.2)

## 2019-07-05 LAB — GLUCOSE, CAPILLARY
Glucose-Capillary: 94 mg/dL (ref 70–99)
Glucose-Capillary: 149 mg/dL — ABNORMAL HIGH (ref 70–99)
Glucose-Capillary: 99 mg/dL (ref 70–99)

## 2019-07-05 LAB — MRSA PCR SCREENING: MRSA by PCR: NEGATIVE

## 2019-07-05 LAB — SARS CORONAVIRUS 2 (TAT 6-24 HRS): SARS Coronavirus 2: NEGATIVE

## 2019-07-05 MED ORDER — FUROSEMIDE 20 MG PO TABS: 40.0000 mg | ORAL_TABLET | Freq: Every day | ORAL | Status: DC
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
## 2019-07-05 NOTE — Discharge Instructions (Signed)
Anemia  Anemia is a condition in which you do not have enough red blood cells or hemoglobin. Hemoglobin is a substance in red blood cells that carries oxygen. When you do not have enough red blood cells or hemoglobin (are anemic), your body cannot get enough oxygen and your organs may not work properly. As a result, you may feel very tired or have other problems. What are the causes? Common causes of anemia include:  Excessive bleeding. Anemia can be caused by excessive bleeding inside or outside the body, including bleeding from the intestine or from periods in women.  Poor nutrition.  Long-lasting (chronic) kidney, thyroid, and liver disease.  Bone marrow disorders.  Cancer and treatments for cancer.  HIV (human immunodeficiency virus) and AIDS (acquired immunodeficiency syndrome).  Treatments for HIV and AIDS.  Spleen problems.  Blood disorders.  Infections, medicines, and autoimmune disorders that destroy red blood cells. What are the signs or symptoms? Symptoms of this condition include:  Minor weakness.  Dizziness.  Headache.  Feeling heartbeats that are irregular or faster than normal (palpitations).  Shortness of breath, especially with exercise.  Paleness.  Cold sensitivity.  Indigestion.  Nausea.  Difficulty sleeping.  Difficulty concentrating. Symptoms may occur suddenly or develop slowly. If your anemia is mild, you may not have symptoms. How is this diagnosed? This condition is diagnosed based on:  Blood tests.  Your medical history.  A physical exam.  Bone marrow biopsy. Your health care provider may also check your stool (feces) for blood and may do additional testing to look for the cause of your bleeding. You may also have other tests, including:  Imaging tests, such as a CT scan or MRI.  Endoscopy.  Colonoscopy. How is this treated? Treatment for this condition depends on the cause. If you continue to lose a lot of blood, you may  need to be treated at a hospital. Treatment may include:  Taking supplements of iron, vitamin M08, or folic acid.  Taking a hormone medicine (erythropoietin) that can help to stimulate red blood cell growth.  Having a blood transfusion. This may be needed if you lose a lot of blood.  Making changes to your diet.  Having surgery to remove your spleen. Follow these instructions at home:  Take over-the-counter and prescription medicines only as told by your health care provider.  Take supplements only as told by your health care provider.  Follow any diet instructions that you were given.  Keep all follow-up visits as told by your health care provider. This is important. Contact a health care provider if:  You develop new bleeding anywhere in the body. Get help right away if:  You are very weak.  You are short of breath.  You have pain in your abdomen or chest.  You are dizzy or feel faint.  You have trouble concentrating.  You have bloody or black, tarry stools.  You vomit repeatedly or you vomit up blood. Summary  Anemia is a condition in which you do not have enough red blood cells or enough of a substance in your red blood cells that carries oxygen (hemoglobin).  Symptoms may occur suddenly or develop slowly.  If your anemia is mild, you may not have symptoms.  This condition is diagnosed with blood tests as well as a medical history and physical exam. Other tests may be needed.  Treatment for this condition depends on the cause of the anemia. This information is not intended to replace advice given to you by  your health care provider. Make sure you discuss any questions you have with your health care provider. Document Released: 09/30/2004 Document Revised: 08/05/2017 Document Reviewed: 09/24/2016 Elsevier Patient Education  2020 [place].  Blood Transfusion, Adult, Care After This sheet gives you information about how to care for yourself after your  procedure. Your doctor may also give you more specific instructions. If you have problems or questions, contact your doctor. Follow these instructions at home:   Take over-the-counter and prescription medicines only as told by your doctor.  Go back to your normal activities as told by your doctor.  Follow instructions from your doctor about how to take care of the area where an IV tube was put into your vein (insertion site). Make sure you: ? Wash your hands with soap and water before you change your bandage (dressing). If there is no soap and water, use hand sanitizer. ? Change your bandage as told by your doctor.  Check your IV insertion site every day for signs of infection. Check for: ? More redness, swelling, or pain. ? More fluid or blood. ? Warmth. ? Pus or a bad smell. Contact a doctor if:  You have more redness, swelling, or pain around the IV insertion site.  You have more fluid or blood coming from the IV insertion site.  Your IV insertion site feels warm to the touch.  You have pus or a bad smell coming from the IV insertion site.  Your pee (urine) turns pink, red, or brown.  You feel weak after doing your normal activities. Get help right away if:  You have signs of a serious allergic or body defense (immune) system reaction, including: ? Itchiness. ? Hives. ? Trouble breathing. ? Anxiety. ? Pain in your chest or lower back. ? Fever, flushing, and chills. ? Fast pulse. ? Rash. ? Watery poop (diarrhea). ? Throwing up (vomiting). ? Dark pee. ? Serious headache. ? Dizziness. ? Stiff neck. ? Yellow color in your face or the white parts of your eyes (jaundice). Summary  After a blood transfusion, return to your normal activities as told by your doctor.  Every day, check for signs of infection where the IV tube was put into your vein.  Some signs of infection are warm skin, more redness and pain, more fluid or blood, and pus or a bad smell where the needle  went in.  Contact your doctor if you feel weak or have any unusual symptoms. This information is not intended to replace advice given to you by your health care provider. Make sure you discuss any questions you have with your health care provider. Document Released: 09/13/2014 Document Revised: 12/28/2017 Document Reviewed: 04/16/2016 Elsevier Patient Education  2020 [place].   IMPORTANT INFORMATION: PAY CLOSE ATTENTION   PHYSICIAN DISCHARGE INSTRUCTIONS  Follow with Primary care provider  Scotty Court, DO  and other consultants as instructed by your Hospitalist Physician  [place] IF SYMPTOMS COME BACK, WORSEN OR NEW PROBLEM DEVELOPS   Please note: You were cared for by a hospitalist during your hospital stay. Every effort will be made to forward records to your primary care provider.  You can request that your primary care provider send for your hospital records if they have not received them.  Once you are discharged, your primary care physician will handle any further medical issues. Please note that NO REFILLS for any discharge medications will be authorized once you are discharged, as it is  imperative that you return to your primary care physician (or establish a relationship with a primary care physician if you do not have one) for your post hospital discharge needs so that they can reassess your need for medications and monitor your lab values.  Please get a complete blood count and chemistry panel checked by your Primary MD at your next visit, and again as instructed by your Primary MD.  Get Medicines reviewed and adjusted: Please take all your medications with you for your next visit with your Primary MD  Laboratory/radiological data: Please request your Primary MD to go over all hospital tests and procedure/radiological results at the follow up, please ask your primary care provider to get all Hospital records sent to his/her  office.  In some cases, they will be blood work, cultures and biopsy results pending at the time of your discharge. Please request that your primary care provider follow up on these results.  If you are diabetic, please bring your blood sugar readings with you to your follow up appointment with primary care.    Please call and make your follow up appointments as soon as possible.    Also Note the following: If you experience worsening of your admission symptoms, develop shortness of breath, life threatening emergency, suicidal or homicidal thoughts you must seek medical attention immediately by calling 911 or calling your MD immediately  if symptoms less severe.  You must read complete instructions/literature along with all the possible adverse reactions/side effects for all the Medicines you take and that have been prescribed to you. Take any new Medicines after you have completely understood and accpet all the possible adverse reactions/side effects.   Do not drive when taking Pain medications or sleeping medications (Benzodiazepines)  Do not take more than prescribed Pain, Sleep and Anxiety Medications. It is not advisable to combine anxiety,sleep and pain medications without talking with your primary care practitioner  Special Instructions: If you have smoked or chewed Tobacco  in the last 2 yrs please stop smoking, stop any regular Alcohol  and or any Recreational drug use.  Wear Seat belts while driving.  Do not drive if taking any narcotic, mind altering or controlled substances or recreational drugs or alcohol.

## 2019-07-05 NOTE — Care Management (Addendum)
Patient here under observation, less than 24 hours, ready to be discharged back to [place]. No FL2 needed for return. Gabriel Cirri of [employer] updated. Will send DC summary when available.

## 2019-07-05 NOTE — Progress Notes (Signed)
IV removed patient tolerated well. Patient taken to lobby via wheelchair, and taken back to [facility] by [employer].

## 2019-07-05 NOTE — Discharge Summary (Signed)
Physician Discharge Summary  ZALIAH WISSNER WGN:[id number] DOB: 1951/07/01 DOA: 07/04/2019  PCP: Scotty Court, DO GI: Rehman  Admit date: 07/04/2019 Discharge date: 07/05/2019  Admitted From: Home  Disposition: Home   Recommendations for Outpatient Follow-up:  1. Follow up with PCP in 1 weeks 2. Follow up with GI in 1-2 weeks 3. Please obtain CBC in 1 week 4. No changes made to medications 5. RESUME REGULAR HEMODIALYSIS SCHEDULE   Discharge Condition: STABLE   CODE STATUS: FULL    Brief Hospitalization Summary: Please see all hospital notes, images, labs for full details of the hospitalization. HPI: Dr. Patriciaann Clan  is a 68 y.o. female, with a history of ESRD on hemodialysis, DM2, HTN,history of slow GI bleed with chronic anemia requiring multiple blood transfusions, lives at [facility]	[place] [facility], patient finished her hemodialysis today, entered was called by Dr. Rosina Lowenstein office, they were notified her hemoglobin was 6.8, and they recommended to transfer to ED, Laurey Arrow hemoglobin in ED was 7.2, patient reports dyspnea at baseline, report generalized weakness, but she denies any cough, chest pain, hemoptysis, change of color in her stools, denies any bright red blood per rectum, patient with known history of gave, followed by Dr. Shelva Majestic, required multiple transfusions in the past. - in ED patient pressures of 92/40, hemoglobin 7.2, she was ordered 2 units PRBC transfusion, I was called to admit for further evaluation.  Symptomatic Anemia/chronic blood loss anemia/Hx of GAVE -Patient was sent by GI for hemoglobin of 6.8, denies any change in the color of her stool, usually on the dark side, denies any bright red blood per rectum, patient with known history of GAVE, followed by GI Dr. Laural Golden. -When she is symptomatic with dyspnea, will transfuse 2 units PRBC. -Pt was treated with Protonix 40 mg IV twice daily. -Patient was transfused 2 units PRBC.  Hg up to  9.4.  No black stools or bloody stool.  Pt stable to DC home.  Follow up with PCP and GI.    ESRD -Patient followed by Dr. Lowanda Foster at dialysis center, she has not dialysis Monday Wednesday Friday, she completed her session today, no evidence of volume overload, no indication for emergent dialysis.   Essential hypertension -resume home treatments  Diabetes mellitus type 2 -resume home treatments  Depression/anxiety -Continue home meds  DVT Prophylaxis - SCDs  AM Labs Ordered, also please review Full Orders  Family Communication: Admission, patients condition and plan of care including tests being ordered have been discussed with the patient who indicate understanding and agree with the plan and Code Status.  Code Status Full  Likely DC to  Home  Condition GUARDED    Consults called: None  Admission status:  Observation  Discharge Diagnoses:  Active Problems:   Type 2 diabetes with nephropathy (HCC)   Essential hypertension   Shortness of breath   Symptomatic anemia   ESRD (end stage renal disease) (HCC)   Diabetic neuropathy (HCC)   GAVE (gastric antral vascular ectasia)   GERD (gastroesophageal reflux disease)   Anemia due to chronic blood loss   Discharge Instructions:  Allergies as of 07/05/2019      Reactions   Penicillins Rash   Did it involve swelling of the face/tongue/throat, SOB, or low BP? Unknown Did it involve sudden or severe rash/hives, skin peeling, or any reaction on the inside of your mouth or nose? Unknown Did you need to seek medical attention at a hospital or doctor's office? Unknown When did it last  happen?unknown If all above answers are "NO", may proceed with cephalosporin use.      Medication List    TAKE these medications   acetaminophen 500 MG tablet Commonly known as: TYLENOL Take 1,000 mg by mouth every 6 (six) hours as needed for mild pain (FOR HEADACHES/MINOR DISCOMFORT).   ALPRAZolam 0.25 MG  tablet Commonly known as: XANAX Take 0.25 mg by mouth 2 (two) times daily.   aluminum-magnesium hydroxide-simethicone 657-846-96 MG/5ML Susp Commonly known as: MAALOX Take 30 mLs by mouth 4 (four) times daily as needed (heartburn/indigestion).   amLODipine 5 MG tablet Commonly known as: NORVASC Take 1 tablet (5 mg total) by mouth at bedtime.   anti-nausea solution Take 30 mLs every 15 (fifteen) minutes as needed by mouth for nausea or vomiting (X4 doses only).   b complex vitamins capsule Take 1 capsule by mouth daily.   buPROPion 75 MG tablet Commonly known as: WELLBUTRIN Take 75 mg by mouth daily.   carvedilol 6.25 MG tablet Commonly known as: COREG Take 1 tablet (6.25 mg total) by mouth 2 (two) times a day.   cholecalciferol 25 MCG (1000 UT) tablet Commonly known as: VITAMIN D3 Take 1,000 Units by mouth daily.   fexofenadine 180 MG tablet Commonly known as: ALLEGRA Take 180 mg by mouth daily.   furosemide 20 MG tablet Commonly known as: LASIX Take 2 tablets (40 mg total) by mouth daily.   gabapentin 300 MG capsule Commonly known as: NEURONTIN Take 600 mg by mouth at bedtime.   gabapentin 300 MG capsule Commonly known as: NEURONTIN Take 300 mg by mouth See admin instructions. Take 300 mg daily at 6am and at 2pm.   glipiZIDE 5 MG tablet Commonly known as: GLUCOTROL Take 1 tablet (5 mg total) by mouth 2 (two) times a day.   guaifenesin 100 MG/5ML syrup Commonly known as: ROBITUSSIN Take 200 mg every 6 (six) hours as needed by mouth for cough.   hydroxypropyl methylcellulose / hypromellose 2.5 % ophthalmic solution Commonly known as: ISOPTO TEARS / GONIOVISC Place 1 drop into both eyes 4 (four) times daily as needed for dry eyes.   insulin lispro 100 UNIT/ML KiwkPen Commonly known as: HUMALOG Inject 10 Units into the skin 3 (three) times daily before meals. *Do Not to inject for levels below 100   loperamide 2 MG capsule Commonly known as: IMODIUM Take 1  capsule (2 mg total) by mouth 3 (three) times daily before meals. What changed:   when to take this  reasons to take this   Melatonin 5 MG Caps Take 10 mg by mouth at bedtime.   pantoprazole 40 MG tablet Commonly known as: PROTONIX Take 1 tablet (40 mg total) by mouth 2 (two) times daily.   sertraline 50 MG tablet Commonly known as: ZOLOFT Take 75 mg by mouth daily.   sevelamer carbonate 800 MG tablet Commonly known as: RENVELA Take 1,600-2,400 mg by mouth See admin instructions. 2400 mg 3 times daily with meals and 1600 mg twice daily with snacks   Tyler Aas FlexTouch 100 UNIT/ML Sopn FlexTouch Pen Generic drug: insulin degludec Inject 50 Units into the skin every morning.      Follow-up Information    [name] P, DO. Schedule an appointment as soon as possible for a visit in 1 week(s).   Contact information: [address] [place] [address] [phone]        [name], MD. Schedule an appointment as soon as possible for a visit in 1 week(s).  Specialty: Gastroenterology Contact information: [address], [address] [place] [address] [phone]          Allergies  Allergen Reactions  . Penicillins Rash    Did it involve swelling of the face/tongue/throat, SOB, or low BP? Unknown Did it involve sudden or severe rash/hives, skin peeling, or any reaction on the inside of your mouth or nose? Unknown Did you need to seek medical attention at a hospital or doctor's office? Unknown When did it last happen?unknown If all above answers are "NO", may proceed with cephalosporin use.     Allergies as of 07/05/2019      Reactions   Penicillins Rash   Did it involve swelling of the face/tongue/throat, SOB, or low BP? Unknown Did it involve sudden or severe rash/hives, skin peeling, or any reaction on the inside of your mouth or nose? Unknown Did you need to seek medical attention at a hospital or doctor's office? Unknown When did it last  happen?unknown If all above answers are "NO", may proceed with cephalosporin use.      Medication List    TAKE these medications   acetaminophen 500 MG tablet Commonly known as: TYLENOL Take 1,000 mg by mouth every 6 (six) hours as needed for mild pain (FOR HEADACHES/MINOR DISCOMFORT).   ALPRAZolam 0.25 MG tablet Commonly known as: XANAX Take 0.25 mg by mouth 2 (two) times daily.   aluminum-magnesium hydroxide-simethicone [phone] MG/5ML Susp Commonly known as: MAALOX Take 30 mLs by mouth 4 (four) times daily as needed (heartburn/indigestion).   amLODipine 5 MG tablet Commonly known as: NORVASC Take 1 tablet (5 mg total) by mouth at bedtime.   anti-nausea solution Take 30 mLs every 15 (fifteen) minutes as needed by mouth for nausea or vomiting (X4 doses only).   b complex vitamins capsule Take 1 capsule by mouth daily.   buPROPion 75 MG tablet Commonly known as: WELLBUTRIN Take 75 mg by mouth daily.   carvedilol 6.25 MG tablet Commonly known as: COREG Take 1 tablet (6.25 mg total) by mouth 2 (two) times a day.   cholecalciferol 25 MCG (1000 UT) tablet Commonly known as: VITAMIN D3 Take 1,000 Units by mouth daily.   fexofenadine 180 MG tablet Commonly known as: ALLEGRA Take 180 mg by mouth daily.   furosemide 20 MG tablet Commonly known as: LASIX Take 2 tablets (40 mg total) by mouth daily.   gabapentin 300 MG capsule Commonly known as: NEURONTIN Take 600 mg by mouth at bedtime.   gabapentin 300 MG capsule Commonly known as: NEURONTIN Take 300 mg by mouth See admin instructions. Take 300 mg daily at 6am and at 2pm.   glipiZIDE 5 MG tablet Commonly known as: GLUCOTROL Take 1 tablet (5 mg total) by mouth 2 (two) times a day.   guaifenesin 100 MG/5ML syrup Commonly known as: ROBITUSSIN Take 200 mg every 6 (six) hours as needed by mouth for cough.   hydroxypropyl methylcellulose / hypromellose 2.5 % ophthalmic solution Commonly known as: ISOPTO  TEARS / GONIOVISC Place 1 drop into both eyes 4 (four) times daily as needed for dry eyes.   insulin lispro 100 UNIT/ML KiwkPen Commonly known as: HUMALOG Inject 10 Units into the skin 3 (three) times daily before meals. *Do Not to inject for levels below 100   loperamide 2 MG capsule Commonly known as: IMODIUM Take 1 capsule (2 mg total) by mouth 3 (three) times daily before meals. What changed:   when to take this  reasons to take  this   Melatonin 5 MG Caps Take 10 mg by mouth at bedtime.   pantoprazole 40 MG tablet Commonly known as: PROTONIX Take 1 tablet (40 mg total) by mouth 2 (two) times daily.   sertraline 50 MG tablet Commonly known as: ZOLOFT Take 75 mg by mouth daily.   sevelamer carbonate 800 MG tablet Commonly known as: RENVELA Take 1,600-2,400 mg by mouth See admin instructions. 2400 mg 3 times daily with meals and 1600 mg twice daily with snacks   Tyler Aas FlexTouch 100 UNIT/ML Sopn FlexTouch Pen Generic drug: insulin degludec Inject 50 Units into the skin every morning.       Procedures/Studies: Dg Chest Portable 1 View  Result Date: 07/04/2019 CLINICAL DATA:  Initial evaluation for acute shortness of breath. EXAM: PORTABLE CHEST 1 VIEW COMPARISON:  Prior radiograph from 04/26/2019. FINDINGS: Cardiomegaly, stable.  Mediastinal silhouette within normal limits. Lungs hypoinflated. No focal infiltrates. No edema or effusion. No pneumothorax. No acute osseous finding. IMPRESSION: 1. No active cardiopulmonary disease. 2. Cardiomegaly, stable. Electronically Signed   By: Jeannine Boga M.D.   On: 07/04/2019 18:31      Subjective: Pt says she feels much better after transfusion.  Wants to go home.  No black stool or bloody stool.  No CP and no SOB or weakness.   Discharge Exam: Vitals:   07/05/19 0445 07/05/19 0749  BP: (!) 141/62 (!) 141/61  Pulse: 68 68  Resp: 17 18  Temp: 98.7 F (37.1 C) 98.7 F (37.1 C)  SpO2: 99% 96%   Vitals:    07/05/19 0112 07/05/19 0400 07/05/19 0445 07/05/19 0749  BP: (!) 129/54 (!) 169/61 (!) 141/62 (!) 141/61  Pulse: 71 73 68 68  Resp: 16 16 17 18   Temp: 98.1 F (36.7 C) (!) 97.5 F (36.4 C) 98.7 F (37.1 C) 98.7 F (37.1 C)  TempSrc: Oral Oral Oral Oral  SpO2: 95% 98% 99% 96%  Weight:      Height:       General: Pt is alert, awake, not in acute distress Cardiovascular: RRR, S1/S2 +, no rubs, no gallops Respiratory: CTA bilaterally, no wheezing, no rhonchi Abdominal: Soft, NT, ND, bowel sounds + Extremities: no edema, no cyanosis   The results of significant diagnostics from this hospitalization (including imaging, microbiology, ancillary and laboratory) are listed below for reference.     Microbiology: Recent Results (from the past 240 hour(s))  MRSA PCR Screening     Status: None   Collection Time: 07/05/19 12:37 AM   Specimen: Nasal Mucosa; Nasopharyngeal  Result Value Ref Range Status   MRSA by PCR NEGATIVE NEGATIVE Final    Comment:        The GeneXpert MRSA Assay (FDA approved for NASAL specimens only), is one component of a comprehensive MRSA colonization surveillance program. It is not intended to diagnose MRSA infection nor to guide or monitor treatment for MRSA infections. Performed at [facility], [address] [place], [place] [address]     Labs: BNP (last 3 results) No results for input(s): BNP in the last 8760 hours. Basic Metabolic Panel: Recent Labs  Lab 07/04/19 1637  NA 133*  K 3.5  CL 98  CO2 26  GLUCOSE 157*  BUN 23  CREATININE 4.09*  CALCIUM 8.0*   Liver Function Tests: No results for input(s): AST, ALT, ALKPHOS, BILITOT, PROT, ALBUMIN in the last 168 hours. No results for input(s): LIPASE, AMYLASE in the last 168 hours. No results for input(s): AMMONIA in the last  168 hours. CBC: Recent Labs  Lab 07/03/19 1005 07/04/19 1637 07/05/19 0927  WBC  --  4.6 3.8*  HGB 6.8* 7.2* 9.4*  HCT 20.7* 23.6* 30.2*  MCV  --  106.3* 97.7   PLT  --  157 129*   Cardiac Enzymes: No results for input(s): CKTOTAL, CKMB, CKMBINDEX, TROPONINI in the last 168 hours. BNP: Invalid input(s): POCBNP CBG: Recent Labs  Lab 07/04/19 2109 07/04/19 2234 07/05/19 0723 07/05/19 1105  GLUCAP 81 116* 94 149*   D-Dimer No results for input(s): DDIMER in the last 72 hours. Hgb A1c No results for input(s): HGBA1C in the last 72 hours. Lipid Profile No results for input(s): CHOL, HDL, LDLCALC, TRIG, CHOLHDL, LDLDIRECT in the last 72 hours. Thyroid function studies No results for input(s): TSH, T4TOTAL, T3FREE, THYROIDAB in the last 72 hours.  Invalid input(s): FREET3 Anemia work up No results for input(s): VITAMINB12, FOLATE, FERRITIN, TIBC, IRON, RETICCTPCT in the last 72 hours. Urinalysis No results found for: COLORURINE, APPEARANCEUR, [place], [place], [place], [place], [place], [place], PROTEINUR, UROBILINOGEN, NITRITE, LEUKOCYTESUR Sepsis Labs Invalid input(s): PROCALCITONIN,  WBC,  LACTICIDVEN Microbiology Recent Results (from the past 240 hour(s))  MRSA PCR Screening     Status: None   Collection Time: 07/05/19 12:37 AM   Specimen: Nasal Mucosa; Nasopharyngeal  Result Value Ref Range Status   MRSA by PCR NEGATIVE NEGATIVE Final    Comment:        The GeneXpert MRSA Assay (FDA approved for NASAL specimens only), is one component of a comprehensive MRSA colonization surveillance program. It is not intended to diagnose MRSA infection nor to guide or monitor treatment for MRSA infections. Performed at [facility], [address], [place], [place] [address]    Time coordinating discharge:   SIGNED:  Irwin Brakeman, MD  [facility] 07/05/2019, 11:54 AM How to contact the [facility] Attending or Consulting provider [place] or covering provider during after hours [place], for this patient?  1. Check the care team in [facility] and look for a) attending/consulting [facility] provider listed and b) the [facility] team listed 2. Log  into [email or web address] and use [facility]'s universal password to access. If you do not have the password, please contact the hospital operator. 3. Locate the [facility] provider you are looking for under [facility] and page to a number that you can be directly reached. 4. If you still have difficulty reaching the provider, please page the [facility] (Director on Call) for the Hospitalists listed on amion for assistance.

## 2019-07-06 LAB — TYPE AND SCREEN
ABO/RH(D): O POS
Antibody Screen: NEGATIVE
Unit division: 0
Unit division: 0

## 2019-07-06 LAB — BPAM RBC
Blood Product Expiration Date: 202012042359
Blood Product Expiration Date: 202012052359
ISSUE DATE / TIME: 202010290040
ISSUE DATE / TIME: 202010290418
Unit Type and Rh: 5100
Unit Type and Rh: 5100

## 2019-08-06 ENCOUNTER — Other Ambulatory Visit: Payer: Self-pay

## 2019-08-06 ENCOUNTER — Emergency Department (HOSPITAL_COMMUNITY): Payer: Medicare Other

## 2019-08-06 ENCOUNTER — Encounter (HOSPITAL_COMMUNITY): Payer: Self-pay | Admitting: Emergency Medicine

## 2019-08-06 ENCOUNTER — Inpatient Hospital Stay (HOSPITAL_COMMUNITY)
Admission: EM | Admit: 2019-08-06 | Discharge: 2019-08-09 | DRG: 811 | Disposition: A | Discharge status: Home / Self Care | Payer: Medicare Other | Attending: Family Medicine | Admitting: Family Medicine

## 2019-08-06 DIAGNOSIS — K766 Portal hypertension: Secondary | ICD-10-CM | POA: Diagnosis present

## 2019-08-06 DIAGNOSIS — K31819 Angiodysplasia of stomach and duodenum without bleeding: Secondary | ICD-10-CM | POA: Diagnosis present

## 2019-08-06 DIAGNOSIS — E8770 Fluid overload, unspecified: Secondary | ICD-10-CM | POA: Diagnosis present

## 2019-08-06 DIAGNOSIS — R519 Headache, unspecified: Secondary | ICD-10-CM | POA: Diagnosis present

## 2019-08-06 DIAGNOSIS — F329 Major depressive disorder, single episode, unspecified: Secondary | ICD-10-CM | POA: Diagnosis present

## 2019-08-06 DIAGNOSIS — K317 Polyp of stomach and duodenum: Secondary | ICD-10-CM | POA: Diagnosis present

## 2019-08-06 DIAGNOSIS — N2581 Secondary hyperparathyroidism of renal origin: Secondary | ICD-10-CM | POA: Diagnosis present

## 2019-08-06 DIAGNOSIS — K921 Melena: Secondary | ICD-10-CM | POA: Diagnosis present

## 2019-08-06 DIAGNOSIS — F17210 Nicotine dependence, cigarettes, uncomplicated: Secondary | ICD-10-CM | POA: Diagnosis present

## 2019-08-06 DIAGNOSIS — J309 Allergic rhinitis, unspecified: Secondary | ICD-10-CM | POA: Diagnosis present

## 2019-08-06 DIAGNOSIS — Z794 Long term (current) use of insulin: Secondary | ICD-10-CM

## 2019-08-06 DIAGNOSIS — Z8249 Family history of ischemic heart disease and other diseases of the circulatory system: Secondary | ICD-10-CM

## 2019-08-06 DIAGNOSIS — D649 Anemia, unspecified: Secondary | ICD-10-CM | POA: Diagnosis not present

## 2019-08-06 DIAGNOSIS — Z833 Family history of diabetes mellitus: Secondary | ICD-10-CM

## 2019-08-06 DIAGNOSIS — R072 Precordial pain: Secondary | ICD-10-CM

## 2019-08-06 DIAGNOSIS — R0600 Dyspnea, unspecified: Secondary | ICD-10-CM | POA: Diagnosis present

## 2019-08-06 DIAGNOSIS — Z66 Do not resuscitate: Secondary | ICD-10-CM | POA: Diagnosis present

## 2019-08-06 DIAGNOSIS — I120 Hypertensive chronic kidney disease with stage 5 chronic kidney disease or end stage renal disease: Secondary | ICD-10-CM | POA: Diagnosis present

## 2019-08-06 DIAGNOSIS — D500 Iron deficiency anemia secondary to blood loss (chronic): Secondary | ICD-10-CM | POA: Diagnosis not present

## 2019-08-06 DIAGNOSIS — R0602 Shortness of breath: Secondary | ICD-10-CM | POA: Diagnosis present

## 2019-08-06 DIAGNOSIS — Z880 Allergy status to penicillin: Secondary | ICD-10-CM

## 2019-08-06 DIAGNOSIS — F419 Anxiety disorder, unspecified: Secondary | ICD-10-CM | POA: Diagnosis present

## 2019-08-06 DIAGNOSIS — E669 Obesity, unspecified: Secondary | ICD-10-CM | POA: Diagnosis present

## 2019-08-06 DIAGNOSIS — D631 Anemia in chronic kidney disease: Secondary | ICD-10-CM | POA: Diagnosis present

## 2019-08-06 DIAGNOSIS — Z6841 Body Mass Index (BMI) 40.0 and over, adult: Secondary | ICD-10-CM

## 2019-08-06 DIAGNOSIS — D61818 Other pancytopenia: Secondary | ICD-10-CM | POA: Diagnosis present

## 2019-08-06 DIAGNOSIS — R195 Other fecal abnormalities: Secondary | ICD-10-CM | POA: Diagnosis present

## 2019-08-06 DIAGNOSIS — E875 Hyperkalemia: Secondary | ICD-10-CM | POA: Diagnosis not present

## 2019-08-06 DIAGNOSIS — Z992 Dependence on renal dialysis: Secondary | ICD-10-CM

## 2019-08-06 DIAGNOSIS — Z79899 Other long term (current) drug therapy: Secondary | ICD-10-CM

## 2019-08-06 DIAGNOSIS — M109 Gout, unspecified: Secondary | ICD-10-CM | POA: Diagnosis present

## 2019-08-06 DIAGNOSIS — N186 End stage renal disease: Secondary | ICD-10-CM | POA: Diagnosis present

## 2019-08-06 DIAGNOSIS — E1142 Type 2 diabetes mellitus with diabetic polyneuropathy: Secondary | ICD-10-CM | POA: Diagnosis present

## 2019-08-06 DIAGNOSIS — D130 Benign neoplasm of esophagus: Secondary | ICD-10-CM | POA: Diagnosis present

## 2019-08-06 DIAGNOSIS — Z961 Presence of intraocular lens: Secondary | ICD-10-CM | POA: Diagnosis present

## 2019-08-06 DIAGNOSIS — Z9849 Cataract extraction status, unspecified eye: Secondary | ICD-10-CM

## 2019-08-06 DIAGNOSIS — K219 Gastro-esophageal reflux disease without esophagitis: Secondary | ICD-10-CM | POA: Diagnosis present

## 2019-08-06 DIAGNOSIS — K2970 Gastritis, unspecified, without bleeding: Secondary | ICD-10-CM | POA: Diagnosis present

## 2019-08-06 DIAGNOSIS — Z85828 Personal history of other malignant neoplasm of skin: Secondary | ICD-10-CM

## 2019-08-06 DIAGNOSIS — Z20828 Contact with and (suspected) exposure to other viral communicable diseases: Secondary | ICD-10-CM | POA: Diagnosis present

## 2019-08-06 DIAGNOSIS — Z807 Family history of other malignant neoplasms of lymphoid, hematopoietic and related tissues: Secondary | ICD-10-CM

## 2019-08-06 LAB — CBC
HCT: 21.9 % — ABNORMAL LOW (ref 36.0–46.0)
Hemoglobin: 6.7 g/dL — CL (ref 12.0–15.0)
MCH: 31.5 pg (ref 26.0–34.0)
MCHC: 30.6 g/dL (ref 30.0–36.0)
MCV: 102.8 fL — ABNORMAL HIGH (ref 80.0–100.0)
Platelets: 131 10*3/uL — ABNORMAL LOW (ref 150–400)
RBC: 2.13 MIL/uL — ABNORMAL LOW (ref 3.87–5.11)
RDW: 20 % — ABNORMAL HIGH (ref 11.5–15.5)
WBC: 3.9 10*3/uL — ABNORMAL LOW (ref 4.0–10.5)
nRBC: 0 % (ref 0.0–0.2)

## 2019-08-06 LAB — HEMOGLOBIN A1C
Hgb A1c MFr Bld: 5 % (ref 4.8–5.6)
Mean Plasma Glucose: 96.8 mg/dL

## 2019-08-06 LAB — BASIC METABOLIC PANEL
Anion gap: 14 (ref 5–15)
BUN: 66 mg/dL — ABNORMAL HIGH (ref 8–23)
CO2: 25 mmol/L (ref 22–32)
Calcium: 8.9 mg/dL (ref 8.9–10.3)
Chloride: 97 mmol/L — ABNORMAL LOW (ref 98–111)
Creatinine, Ser: 9.09 mg/dL — ABNORMAL HIGH (ref 0.44–1.00)
GFR calc Af Amer: 5 mL/min — ABNORMAL LOW (ref >60)
GFR calc non Af Amer: 4 mL/min — ABNORMAL LOW (ref >60)
Glucose, Bld: 159 mg/dL — ABNORMAL HIGH (ref 70–99)
Potassium: 5.4 mmol/L — ABNORMAL HIGH (ref 3.5–5.1)
Sodium: 136 mmol/L (ref 135–145)

## 2019-08-06 LAB — CBG MONITORING, ED: Glucose-Capillary: 50 mg/dL — ABNORMAL LOW (ref 70–99)

## 2019-08-06 LAB — GLUCOSE, CAPILLARY: Glucose-Capillary: 155 mg/dL — ABNORMAL HIGH (ref 70–99)

## 2019-08-06 LAB — PREPARE RBC (CROSSMATCH)

## 2019-08-06 LAB — TROPONIN I (HIGH SENSITIVITY)
Troponin I (High Sensitivity): 17 ng/L (ref <18)
Troponin I (High Sensitivity): 18 ng/L — ABNORMAL HIGH (ref <18)

## 2019-08-06 MED ORDER — TRAZODONE HCL 50 MG PO TABS
25.0000 mg | ORAL_TABLET | Freq: Every evening | ORAL | Status: DC | PRN
  Administered 2019-08-07 – 2019-08-08 (×2): 25 mg via ORAL
  Filled 2019-08-06 (×2): qty 1

## 2019-08-06 MED ORDER — BUPROPION HCL 75 MG PO TABS
75.0000 mg | ORAL_TABLET | Freq: Every day | ORAL | Status: DC
  Administered 2019-08-07 – 2019-08-09 (×3): 75 mg via ORAL
  Filled 2019-08-06 (×4): qty 1

## 2019-08-06 MED ORDER — POLYETHYLENE GLYCOL 3350 17 G PO PACK: 17.0000 g | PACK | Freq: Every day | ORAL | Status: DC | PRN

## 2019-08-06 MED ORDER — SODIUM CHLORIDE 0.9 % IV SOLN: 10.0000 mL/h | Freq: Once | INTRAVENOUS | Status: DC

## 2019-08-06 MED ORDER — DEXTROSE 50 % IV SOLN
INTRAVENOUS | Status: AC
  Administered 2019-08-06: 17:00:00
  Filled 2019-08-06: qty 50

## 2019-08-06 MED ORDER — SERTRALINE HCL 50 MG PO TABS
75.0000 mg | ORAL_TABLET | Freq: Every day | ORAL | Status: DC
  Administered 2019-08-07 – 2019-08-09 (×3): 75 mg via ORAL
  Filled 2019-08-06 (×3): qty 2

## 2019-08-06 MED ORDER — ALPRAZOLAM 0.25 MG PO TABS
0.2500 mg | ORAL_TABLET | Freq: Two times a day (BID) | ORAL | Status: DC
  Administered 2019-08-06 – 2019-08-09 (×6): 0.25 mg via ORAL
  Filled 2019-08-06 (×6): qty 1

## 2019-08-06 MED ORDER — GLIPIZIDE 5 MG PO TABS: 5.0000 mg | ORAL_TABLET | Freq: Two times a day (BID) | ORAL | Status: DC

## 2019-08-06 MED ORDER — INSULIN DEGLUDEC 100 UNIT/ML ~~LOC~~ SOPN: 30.0000 [IU] | PEN_INJECTOR | Freq: Every morning | SUBCUTANEOUS | Status: DC

## 2019-08-06 MED ORDER — ONDANSETRON HCL 4 MG/2ML IJ SOLN
4.0000 mg | Freq: Four times a day (QID) | INTRAMUSCULAR | Status: DC | PRN
  Administered 2019-08-08: 4 mg via INTRAVENOUS
  Filled 2019-08-06: qty 2

## 2019-08-06 MED ORDER — CARVEDILOL 3.125 MG PO TABS
6.2500 mg | ORAL_TABLET | Freq: Two times a day (BID) | ORAL | Status: DC
  Administered 2019-08-07 – 2019-08-09 (×5): 6.25 mg via ORAL
  Filled 2019-08-06 (×5): qty 2

## 2019-08-06 MED ORDER — PANTOPRAZOLE SODIUM 40 MG PO TBEC
40.0000 mg | DELAYED_RELEASE_TABLET | Freq: Two times a day (BID) | ORAL | Status: DC
  Administered 2019-08-06 – 2019-08-09 (×6): 40 mg via ORAL
  Filled 2019-08-06 (×6): qty 1

## 2019-08-06 MED ORDER — FUROSEMIDE 40 MG PO TABS
40.0000 mg | ORAL_TABLET | Freq: Every day | ORAL | Status: DC
  Administered 2019-08-07 – 2019-08-09 (×3): 40 mg via ORAL
  Filled 2019-08-06 (×3): qty 1

## 2019-08-06 MED ORDER — FUROSEMIDE 10 MG/ML IJ SOLN
40.0000 mg | Freq: Once | INTRAMUSCULAR | Status: AC
  Administered 2019-08-06: 40 mg via INTRAVENOUS
  Filled 2019-08-06: qty 4

## 2019-08-06 MED ORDER — ACETAMINOPHEN 325 MG PO TABS
650.0000 mg | ORAL_TABLET | Freq: Four times a day (QID) | ORAL | Status: DC | PRN
  Administered 2019-08-06: 650 mg via ORAL
  Filled 2019-08-06: qty 2

## 2019-08-06 MED ORDER — ONDANSETRON HCL 4 MG PO TABS: 4.0000 mg | ORAL_TABLET | Freq: Four times a day (QID) | ORAL | Status: DC | PRN

## 2019-08-06 MED ORDER — SEVELAMER CARBONATE 800 MG PO TABS
1600.0000 mg | ORAL_TABLET | Freq: Two times a day (BID) | ORAL | Status: DC
  Administered 2019-08-06 – 2019-08-08 (×4): 1600 mg via ORAL
  Filled 2019-08-06 (×5): qty 2

## 2019-08-06 MED ORDER — SEVELAMER CARBONATE 800 MG PO TABS
2400.0000 mg | ORAL_TABLET | Freq: Three times a day (TID) | ORAL | Status: DC
  Administered 2019-08-07 – 2019-08-09 (×5): 2400 mg via ORAL
  Filled 2019-08-06 (×6): qty 3

## 2019-08-06 MED ORDER — ACETAMINOPHEN 650 MG RE SUPP
650.0000 mg | Freq: Four times a day (QID) | RECTAL | Status: DC | PRN
  Administered 2019-08-07: 650 mg via RECTAL
  Filled 2019-08-06: qty 1

## 2019-08-06 MED ORDER — SEVELAMER CARBONATE 800 MG PO TABS: 1600.0000 mg | ORAL_TABLET | ORAL | Status: DC

## 2019-08-06 MED ORDER — INSULIN ASPART 100 UNIT/ML ~~LOC~~ SOLN
0.0000 [IU] | Freq: Three times a day (TID) | SUBCUTANEOUS | Status: DC
  Administered 2019-08-08 – 2019-08-09 (×2): 1 [IU] via SUBCUTANEOUS

## 2019-08-06 MED ORDER — INSULIN GLARGINE 100 UNIT/ML ~~LOC~~ SOLN
20.0000 [IU] | Freq: Every day | SUBCUTANEOUS | Status: DC
  Filled 2019-08-06: qty 0.2

## 2019-08-06 MED ORDER — INSULIN GLARGINE 100 UNIT/ML ~~LOC~~ SOLN
30.0000 [IU] | Freq: Every day | SUBCUTANEOUS | Status: DC
  Filled 2019-08-06 (×2): qty 0.3

## 2019-08-06 MED ORDER — AMLODIPINE BESYLATE 5 MG PO TABS
5.0000 mg | ORAL_TABLET | Freq: Every day | ORAL | Status: DC
  Administered 2019-08-07 – 2019-08-08 (×2): 5 mg via ORAL
  Filled 2019-08-06 (×2): qty 1

## 2019-08-06 NOTE — H&P (Addendum)
History and Physical    Tanya Lucero PQD:[id number] DOB: 06/05/1951 DOA: 08/06/2019  PCP: Scotty Court, DO   Patient coming from: [facility].  I have personally briefly reviewed patient's old medical records in [place]  Chief Complaint: Chest pain, difficulty breathing.  HPI: Tanya Lucero is a 68 y.o. female with medical history significant for ESRD, DM2, HTN, GAVE, Anxiety and depression. Presented to the Ed with c/o central to slightly left-sided chest pain that started this morning when she was moving around trying to get ready to go for her dialysis.  Describes pain as sharp and burning.  She reports associated difficulty catching her breath.  No nausea no vomiting.  Symptoms resolved when she rested.  She reports prior episodes similar symptoms in the past with exertion. She reports black stools over the past several weeks.  No vomiting, no abdominal pain.  She denies NSAID use.  She is on Lasix and reports she still makes urine.  HD schedule Monday Wednesday Friday, her last dialysis was on Friday.  She did not get dialyzed today.  Recent hospitalization 10/28-10/29 for symptomatic anemia from chronic blood loss.  Transfuse 2 units PRBC. Multiple admissions in the past year for symptomatic anemia.   ED Course: O2 sats 90 to 96% on room air.  Hgb 6.7. HS troponin 17 > 18.  Portable chest x-ray showed chronic interstitial prominence with possible superimposed interstitial edema.  2 units PRBCs ordered for transfusion.  But there is a delay in getting blood, plans to get blood from [facility]. EDP talked to nephrology, patient will be dialyzed tomorrow.  Review of Systems: As per HPI all other systems reviewed and negative.  Past Medical History:  Diagnosis Date  . Anemia   . Anxiety   . Arthritis   . Chronic kidney disease    neuphrotic syndrome-05/2013  . Constipation   . Decreased vision    left eye  . Depression   .  Diabetes mellitus    Type 2  . Diabetic neuropathy ([place])   . Diverticulitis   . Dry skin   . GERD (gastroesophageal reflux disease)   . GI bleed 07/05/2017  . Gout   . Headache   . Hypertension   . Neuropathy   . Shortness of breath dyspnea   . Skin cancer     Past Surgical History:  Procedure Laterality Date  . ABDOMINAL HYSTERECTOMY    . ADENOIDECTOMY    . APPENDECTOMY     taken out with hysterectomy  . AV FISTULA PLACEMENT Right 09/13/2014   Procedure: Creation of Right Arm RADIOCEPHALIC ARTERIOVENOUS (AV) FISTULA ;  Surgeon: [name], MD;  Location: [facility];  Service: Vascular;  Laterality: Right;  . BACK SURGERY     lumbar -   . CATARACT EXTRACTION W/PHACO Left 07/02/2013   Procedure: CATARACT EXTRACTION PHACO AND INTRAOCULAR LENS PLACEMENT (IOC);  Surgeon: [name], MD;  Location: [facility];  Service: Ophthalmology;  Laterality: Left;  CDE:  6.46  . CATARACT EXTRACTION W/PHACO Right 07/12/2013   Procedure: CATARACT EXTRACTION PHACO AND INTRAOCULAR LENS PLACEMENT (IOC);  Surgeon: [name], MD;  Location: [facility];  Service: Ophthalmology;  Laterality: Right;  CDE:12.48  . COLONOSCOPY N/A 12/11/2015   Procedure: COLONOSCOPY;  Surgeon: [name], MD;  [facility];  Service: Endoscopy;  Laterality: N/A;  1:55  . ESOPHAGOGASTRODUODENOSCOPY N/A 03/11/2016   Procedure: ESOPHAGOGASTRODUODENOSCOPY (EGD);  Surgeon: [name], MD;  [facility];  Service: Endoscopy;  Laterality: N/A;  1230 - Last pt of the day-Dr. Laural Golden going to office to see pt's  . ESOPHAGOGASTRODUODENOSCOPY N/A 07/21/2017   Procedure: ESOPHAGOGASTRODUODENOSCOPY (EGD);  Surgeon: [name], MD;  [facility];  Service: Endoscopy;  Laterality: N/A;  3:00  . ESOPHAGOGASTRODUODENOSCOPY N/A 11/30/2018   Procedure: ESOPHAGOGASTRODUODENOSCOPY (EGD);  Surgeon: [name], MD;  [facility];  Service: Endoscopy;  Laterality: N/A;  9:55 - spoke with the facility  and advised new arrival time 10:20  . ESOPHAGOGASTRODUODENOSCOPY N/A 01/11/2019   Procedure: ESOPHAGOGASTRODUODENOSCOPY (EGD);  Surgeon: [name], MD;  [facility];  Service: Endoscopy;  Laterality: N/A;  155 - office spoke with facility and they know to have pt here at 11:00  . FOOT SURGERY Right    x2-heel spur   . GIVENS CAPSULE STUDY N/A 11/09/2016   Procedure: GIVENS CAPSULE STUDY;  Surgeon: [name], MD;  [facility];  Service: Endoscopy;  Laterality: N/A;  7:30  . GIVENS CAPSULE STUDY N/A 11/10/2018   Procedure: GIVENS CAPSULE STUDY;  Surgeon: [name], MD;  [facility];  Service: Endoscopy;  Laterality: N/A;  . REVISON OF ARTERIOVENOUS FISTULA Right 01/05/2016   Procedure: REVISON OF ARTERIOVENOUS FISTULA- right forearm;  Surgeon: [name], MD;  Location: [place];  Service: Vascular;  Laterality: Right;  . SKIN CANCER EXCISION     forehead  . TONSILLECTOMY       reports that she has been smoking cigarettes. She has a 10.00 pack-year smoking history. She has never used smokeless tobacco. She reports that she does not drink alcohol or use drugs.  Allergies  Allergen Reactions  . Penicillins Rash    Did it involve swelling of the face/tongue/throat, SOB, or low BP? Unknown Did it involve sudden or severe rash/hives, skin peeling, or any reaction on the inside of your mouth or nose? Unknown Did you need to seek medical attention at a hospital or doctor's office? Unknown When did it last happen?unknown If all above answers are "NO", may proceed with cephalosporin use.      Family History  Problem Relation Age of Onset  . Multiple myeloma Mother   . Diabetes Mother   . Cirrhosis Mother   . Heart attack Father 46  . Colon cancer Neg Hx     Prior to Admission medications   Medication Sig Start Date End Date Taking? Authorizing Provider  acetaminophen (TYLENOL) 500 MG tablet Take 1,000 mg by mouth every 6 (six) hours  as needed for mild pain (FOR HEADACHES/MINOR DISCOMFORT).    Yes [provider]  ALPRAZolam (XANAX) 0.25 MG tablet Take 0.25 mg by mouth 2 (two) times daily.    Yes [provider]  aluminum-magnesium hydroxide-simethicone (MAALOX) 200-200-20 MG/5ML SUSP Take 30 mLs by mouth 4 (four) times daily as needed (heartburn/indigestion).   Yes [provider]  amLODipine (NORVASC) 5 MG tablet Take 1 tablet (5 mg total) by mouth at bedtime. 02/28/19  Yes Ashunti Schofield, Cour[place], MD  b complex vitamins capsule Take 1 capsule by mouth daily.    Yes [provider]  buPROPion (WELLBUTRIN) 75 MG tablet Take 75 mg by mouth daily.    Yes [provider]  carvedilol (COREG) 6.25 MG tablet Take 1 tablet (6.25 mg total) by mouth 2 (two) times a day. 02/28/19  Yes Aicha Clingenpeel, Courage, MD  cholecalciferol (VITAMIN D3) 25 MCG (1000 UT) tablet  Take 1,000 Units by mouth daily.   Yes [provider]  fexofenadine (ALLEGRA) 180 MG tablet Take 180 mg by mouth daily.    Yes [provider]  furosemide (LASIX) 20 MG tablet Take 20 mg by mouth daily as needed for edema.   Yes [provider]  furosemide (LASIX) 40 MG tablet Take 40 mg by mouth daily.   Yes [provider]  gabapentin (NEURONTIN) 300 MG capsule Take 300-600 mg by mouth See admin instructions. Patient takes 331m at 0800 and at 1400; patient then takes 6046mat 2000   Yes [provider]  glipiZIDE (GLUCOTROL) 5 MG tablet Take 1 tablet (5 mg total) by mouth 2 (two) times a day. 02/28/19  Yes Tereka Thorley, Courage, MD  guaiFENesin-codeine (ROBITUSSIN AC) 100-10 MG/5ML syrup Take 5 mLs by mouth 4 (four) times daily as needed for cough.   Yes [provider]  hydroxypropyl methylcellulose / hypromellose (ISOPTO TEARS / GONIOVISC) 2.5 % ophthalmic solution Place 1 drop into both eyes every 4 (four) hours as needed for dry eyes.    Yes [provider]  insulin lispro (HUMALOG) 100  UNIT/ML KiwkPen Inject 10 Units into the skin 3 (three) times daily before meals. *Do Not to inject for levels below 100   Yes [provider]  loperamide (IMODIUM) 2 MG capsule Take 1 capsule (2 mg total) by mouth 3 (three) times daily before meals. Patient taking differently: Take 2 mg by mouth daily.  06/05/19  Yes Rehman, NaMechele DawleyMD  Melatonin 5 MG CAPS Take 10 mg by mouth at bedtime.    Yes [provider]  pantoprazole (PROTONIX) 40 MG tablet Take 1 tablet (40 mg total) by mouth 2 (two) times daily. 11/10/18  Yes MaBarton DuboisMD  sertraline (ZOLOFT) 50 MG tablet Take 75 mg by mouth daily.    Yes [provider]  sevelamer carbonate (RENVELA) 800 MG tablet Take 1,600-2,400 mg by mouth See admin instructions. 2400 mg 3 times daily with meals and 1600 mg twice daily with snacks   Yes [provider]  traZODone (DESYREL) 50 MG tablet Take 25 mg by mouth at bedtime as needed for sleep.   Yes [provider]  TRESIBA FLEXTOUCH 100 UNIT/ML SOPN FlexTouch Pen Inject 50 Units into the skin every morning.  06/26/19  Yes [provider]    Physical Exam: Vitals:   08/06/19 1045 08/06/19 1300 08/06/19 1645 08/06/19 1700  BP:  (!) 146/82 (!) 148/89 (!) 165/58  Pulse: 75 72 75 62  Resp: '14 19 18 13  ' Temp:   98.2 F (36.8 C)   TempSrc:   Oral   SpO2: (!) 89% 94% 93% 90%  Weight:      Height:        Constitutional: NAD, calm, comfortable Vitals:   08/06/19 1045 08/06/19 1300 08/06/19 1645 08/06/19 1700  BP:  (!) 146/82 (!) 148/89 (!) 165/58  Pulse: 75 72 75 62  Resp: '14 19 18 13  ' Temp:   98.2 F (36.8 C)   TempSrc:   Oral   SpO2: (!) 89% 94% 93% 90%  Weight:      Height:       Eyes: PERRL, lids and conjunctivae normal ENMT: Mucous membranes are moist. Posterior pharynx clear of any exudate or lesions. Neck: normal, supple, no masses, no thyromegaly Respiratory: clear to auscultation bilaterally, no wheezing, no crackles. Normal  respiratory effort. No accessory muscle use.  Cardiovascular: Regular rate and rhythm,  no murmurs / rubs / gallops.  2+ bilateral pitting pedal edema patient states this is chronic and unchanged.  No extremity edema. 2+ pedal pulses.   Abdomen: no tenderness, no masses palpated. No hepatosplenomegaly. Bowel sounds positive.  Musculoskeletal: no clubbing / cyanosis. No joint deformity upper and lower extremities. Good ROM, no contractures. Normal muscle tone.  Skin: no rashes, lesions, ulcers. No induration Neurologic: CN 2-12 grossly intact. Strength 5/5 in all 4.  Psychiatric: Normal judgment and insight. Alert and oriented x 3. Normal mood.   Labs on Admission: I have personally reviewed following labs and imaging studies  CBC: Recent Labs  Lab 08/06/19 1121  WBC 3.9*  HGB 6.7*  HCT 21.9*  MCV 102.8*  PLT 734*   Basic Metabolic Panel: Recent Labs  Lab 08/06/19 1121  NA 136  K 5.4*  CL 97*  CO2 25  GLUCOSE 159*  BUN 66*  CREATININE 9.09*  CALCIUM 8.9   CBG: Recent Labs  Lab 08/06/19 1656  GLUCAP 50*    Radiological Exams on Admission: Dg Chest Portable 1 View  Result Date: 08/06/2019 CLINICAL DATA:  Chest pain EXAM: PORTABLE CHEST 1 VIEW COMPARISON:  July 04, 2019 FINDINGS: Suboptimal evaluation due to body habitus and portable technique. Chronic interstitial prominence with possible superimposed interstitial edema. Probable atelectasis at the right lung base. No pleural effusion. Stable cardiomegaly. IMPRESSION: Chronic interstitial prominence with possible superimposed interstitial edema. Probable right basilar atelectasis. Electronically Signed   By: Macy Mis M.D.   On: 08/06/2019 11:13    EKG: Independently reviewed.  Sinus rhythm, QTC 449, rate 76.  Nonspecific T wave changes lateral leads V4 through V6.  Assessment/Plan Active Problems:   Symptomatic anemia  Symptomatic anemia- hemoglobin 6.7, down from 9.4 10/29.  She reports melena.  Denies NSAID  use.,  Last EGD 01/11/19- Gastric antral vascular ectasia without bleeding. Treated with argon plasma coagulation. -Stool FOBT -Continue home Protonix 40 mg twice daily -Transfuse 2 units PRBC - IV lasix 34m x 1 between transfusions.  -CBC a.m. -Will consult GI-consult placed - NPO mid night pending GI eval.   Chest pain- with SOB, relived with rest. Her anemia would explain these symptoms but they also sound anginal. EKG- non specific T wave changes lateral leads. Hs trop 17 > 18.  - Trend trop - repeat EKG a.m   ESRD- Schedule MWF. Last HD Friday. - EDP consulted nephrology, will see in a.m - Resume home renal meds - Resume hoe lasix in a.m  Anxiety/depression  - Resume hoem bupropion, sertraline, trazodone QHS,  PRN xanax 0.25  Diabetes mellitus, diabetic neuropathy. Random glucose 159, dropped to 50  ,patient not eaten all day. - Resume home Tresiba at reduced dose 20u daily - SSI  Hypertension- Elevated. - Resume home coreg, norvasc lasix.   DVT prophylaxis: SCDS Code Status: DNR, confirmed with patient, and universal form at bedside Family Communication: None at bedside Disposition Plan: 1- 2 days Consults called: Gastroenterology,Nephrology Admission status: Obs, tele   EBethena RoysMD [facility]  08/06/2019, 6:57 PM

## 2019-08-06 NOTE — ED Provider Notes (Signed)
[facility] Provider Note   CSN: [id number] Arrival date & time: 08/06/19  1018     History   Chief Complaint Chief Complaint  Patient presents with  . Chest Pain    HPI Tanya Lucero is a 68 y.o. female.     Patient with a known history of anemia secondary to slow GI blood loss.  Patient currently at [facility] facility in [place].  Had a complaint of some center chest pain that radiated to her lower neck area.  Relieved with baby aspirin 4 tablets given at the facility.  Patient states the pain is improved but still feels kind of short of breath.  Patient is post to go to dialysis today.  She is normally dialyzed Monday Wednesdays and Fridays.  Her last dialysis treatment was on Friday, November 27.     Past Medical History:  Diagnosis Date  . Anemia   . Anxiety   . Arthritis   . Chronic kidney disease    neuphrotic syndrome-05/2013  . Constipation   . Decreased vision    left eye  . Depression   . Diabetes mellitus    Type 2  . Diabetic neuropathy ([place])   . Diverticulitis   . Dry skin   . GERD (gastroesophageal reflux disease)   . GI bleed 07/05/2017  . Gout   . Headache   . Hypertension   . Neuropathy   . Shortness of breath dyspnea   . Skin cancer     Patient Active Problem List   Diagnosis Date Noted  . GERD (gastroesophageal reflux disease) 06/05/2019  . Anemia due to chronic blood loss 06/05/2019  . 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 Heme positive stool 11/20/2018  . Symptomatic anemia 11/09/2018  . ESRD (end stage renal disease) ([place]) 11/09/2018  . Anxiety and depression 11/09/2018  . Secondary hyperparathyroidism of renal origin ([place]) 11/09/2018  . Diabetic neuropathy ([place]) 11/09/2018  . Guaiac positive stools 07/19/2017  . GI bleed 07/05/2017  . Iron deficiency anemia due to chronic blood loss 10/27/2016  . RENAL INSUFFICIENCY 03/04/2010  . Type 2 diabetes with nephropathy ([place]) 02/17/2010  .  Essential hypertension 02/17/2010  . OTHER MALAISE AND FATIGUE 02/17/2010  . Shortness of breath 02/17/2010  . CHEST PAIN, PRECORDIAL 02/17/2010    Past Surgical History:  Procedure Laterality Date  . ABDOMINAL HYSTERECTOMY    . ADENOIDECTOMY    . APPENDECTOMY     taken out with hysterectomy  . AV FISTULA PLACEMENT Right 09/13/2014   Procedure: Creation of Right Arm RADIOCEPHALIC ARTERIOVENOUS (AV) FISTULA ;  Surgeon: [name], MD;  Location: [facility];  Service: Vascular;  Laterality: Right;  . BACK SURGERY     lumbar -   . CATARACT EXTRACTION W/PHACO Left 07/02/2013   Procedure: CATARACT EXTRACTION PHACO AND INTRAOCULAR LENS PLACEMENT (IOC);  Surgeon: [name], MD;  Location: [facility];  Service: Ophthalmology;  Laterality: Left;  CDE:  6.46  . CATARACT EXTRACTION W/PHACO Right 07/12/2013   Procedure: CATARACT EXTRACTION PHACO AND INTRAOCULAR LENS PLACEMENT (IOC);  Surgeon: [name], MD;  Location: [facility];  Service: Ophthalmology;  Laterality: Right;  CDE:12.48  . COLONOSCOPY N/A 12/11/2015   Procedure: COLONOSCOPY;  Surgeon: [name], MD;  [facility];  Service: Endoscopy;  Laterality: N/A;  1:55  . ESOPHAGOGASTRODUODENOSCOPY N/A 03/11/2016   Procedure: ESOPHAGOGASTRODUODENOSCOPY (EGD);  Surgeon: [name], MD;  [facility];  Service: Endoscopy;  Laterality: N/A;  1230 - Last pt of the day-Dr. Laural Golden going to office to see pt's  . ESOPHAGOGASTRODUODENOSCOPY N/A 07/21/2017   Procedure: ESOPHAGOGASTRODUODENOSCOPY (EGD);  Surgeon: [name], MD;  [facility];  Service: Endoscopy;  Laterality: N/A;  3:00  . ESOPHAGOGASTRODUODENOSCOPY N/A 11/30/2018   Procedure: ESOPHAGOGASTRODUODENOSCOPY (EGD);  Surgeon: [name], MD;  [facility];  Service: Endoscopy;  Laterality: N/A;  9:55 - spoke with the facility and advised new arrival time 10:20  . ESOPHAGOGASTRODUODENOSCOPY N/A 01/11/2019   Procedure: ESOPHAGOGASTRODUODENOSCOPY  (EGD);  Surgeon: [name], MD;  [facility];  Service: Endoscopy;  Laterality: N/A;  155 - office spoke with facility and they know to have pt here at 11:00  . FOOT SURGERY Right    x2-heel spur   . GIVENS CAPSULE STUDY N/A 11/09/2016   Procedure: GIVENS CAPSULE STUDY;  Surgeon: [name], MD;  [facility];  Service: Endoscopy;  Laterality: N/A;  7:30  . GIVENS CAPSULE STUDY N/A 11/10/2018   Procedure: GIVENS CAPSULE STUDY;  Surgeon: [name], MD;  [facility];  Service: Endoscopy;  Laterality: N/A;  . REVISON OF ARTERIOVENOUS FISTULA Right 01/05/2016   Procedure: REVISON OF ARTERIOVENOUS FISTULA- right forearm;  Surgeon: [name], MD;  Location: [facility];  Service: Vascular;  Laterality: Right;  . SKIN CANCER EXCISION     forehead  . TONSILLECTOMY       OB History    Gravida      Para      Term      Preterm      AB      Living  0     SAB      TAB      Ectopic      Multiple      Live Births               Home Medications    Prior to Admission medications   Medication Sig Start Date End Date Taking? Authorizing Provider  acetaminophen (TYLENOL) 500 MG tablet Take 1,000 mg by mouth every 6 (six) hours as needed for mild pain (FOR HEADACHES/MINOR DISCOMFORT).    Yes [provider]  ALPRAZolam (XANAX) 0.25 MG tablet Take 0.25 mg by mouth 2 (two) times daily.    Yes [provider]  aluminum-magnesium hydroxide-simethicone (MAALOX) 200-200-20 MG/5ML SUSP Take 30 mLs by mouth 4 (four) times daily as needed (heartburn/indigestion).   Yes [provider]  amLODipine (NORVASC) 5 MG tablet Take 1 tablet (5 mg total) by mouth at bedtime. 02/28/19  Yes Emokpae, Courage, MD  b complex vitamins capsule Take 1 capsule by mouth daily.    Yes [provider]  buPROPion (WELLBUTRIN) 75 MG tablet Take 75 mg by mouth daily.    Yes [provider]  carvedilol (COREG) 6.25 MG tablet Take  1 tablet (6.25 mg total) by mouth 2 (two) times a day. 02/28/19  Yes Emokpae, Courage, MD  cholecalciferol (VITAMIN D3) 25 MCG (1000 UT) tablet Take 1,000 Units by mouth daily.   Yes [provider]  fexofenadine (ALLEGRA) 180 MG tablet Take 180 mg by mouth daily.    Yes [provider]  furosemide (LASIX) 20 MG tablet Take 20 mg by mouth daily as needed for edema.   Yes [provider]  furosemide (LASIX) 40 MG tablet Take 40 mg by mouth daily.   Yes [provider]  gabapentin (NEURONTIN) 300  MG capsule Take 300-600 mg by mouth See admin instructions. Patient takes '300mg'$  at 0800 and at 1400; patient then takes '600mg'$  at 2000   Yes [provider]  glipiZIDE (GLUCOTROL) 5 MG tablet Take 1 tablet (5 mg total) by mouth 2 (two) times a day. 02/28/19  Yes Emokpae, Courage, MD  guaiFENesin-codeine (ROBITUSSIN AC) 100-10 MG/5ML syrup Take 5 mLs by mouth 4 (four) times daily as needed for cough.   Yes [provider]  hydroxypropyl methylcellulose / hypromellose (ISOPTO TEARS / GONIOVISC) 2.5 % ophthalmic solution Place 1 drop into both eyes every 4 (four) hours as needed for dry eyes.    Yes [provider]  insulin lispro (HUMALOG) 100 UNIT/ML KiwkPen Inject 10 Units into the skin 3 (three) times daily before meals. *Do Not to inject for levels below 100   Yes [provider]  loperamide (IMODIUM) 2 MG capsule Take 1 capsule (2 mg total) by mouth 3 (three) times daily before meals. Patient taking differently: Take 2 mg by mouth daily.  06/05/19  Yes Rehman, Mechele Dawley, MD  Melatonin 5 MG CAPS Take 10 mg by mouth at bedtime.    Yes [provider]  pantoprazole (PROTONIX) 40 MG tablet Take 1 tablet (40 mg total) by mouth 2 (two) times daily. 11/10/18  Yes Barton Dubois, MD  sertraline (ZOLOFT) 50 MG tablet Take 75 mg by mouth daily.    Yes [provider]  sevelamer carbonate (RENVELA) 800 MG tablet Take 1,600-2,400 mg by  mouth See admin instructions. 2400 mg 3 times daily with meals and 1600 mg twice daily with snacks   Yes [provider]  traZODone (DESYREL) 50 MG tablet Take 25 mg by mouth at bedtime as needed for sleep.   Yes [provider]  TRESIBA FLEXTOUCH 100 UNIT/ML SOPN FlexTouch Pen Inject 50 Units into the skin every morning.  06/26/19  Yes [provider]    Family History Family History  Problem Relation Age of Onset  . Multiple myeloma Mother   . Diabetes Mother   . Cirrhosis Mother   . Heart attack Father 72  . Colon cancer Neg Hx     Social History Social History   Tobacco Use  . Smoking status: Current Every Day Smoker    Packs/day: 0.25    Years: 40.00    Pack years: 10.00    Types: Cigarettes  . Smokeless tobacco: Never Used  Substance Use Topics  . Alcohol use: No    Alcohol/week: 0.0 standard drinks  . Drug use: No     Allergies   Penicillins   Review of Systems Review of Systems  Constitutional: Negative for chills and fever.  HENT: Negative for rhinorrhea and sore throat.   Eyes: Negative for visual disturbance.  Respiratory: Positive for shortness of breath. Negative for cough.   Cardiovascular: Positive for chest pain. Negative for leg swelling.  Gastrointestinal: Negative for abdominal pain, diarrhea, nausea and vomiting.  Genitourinary: Negative for dysuria.  Musculoskeletal: Negative for back pain and neck pain.  Skin: Negative for rash.  Neurological: Negative for dizziness, light-headedness and headaches.  Hematological: Does not bruise/bleed easily.  Psychiatric/Behavioral: Negative for confusion.     Physical Exam Updated Vital Signs BP (!) 146/82   Pulse 72   Temp 98.5 F (36.9 C) (Oral)   Resp 19   Ht 1.575 m ('5\' 2"'$ )   Wt 98.9 kg   SpO2 94%   BMI 39.88 kg/m   Physical Exam Vitals  signs and nursing note reviewed.  Constitutional:      General: She is not in acute distress.    Appearance: Normal  appearance. She is well-developed.  HENT:     Head: Normocephalic and atraumatic.  Eyes:     Extraocular Movements: Extraocular movements intact.     Conjunctiva/sclera: Conjunctivae normal.     Pupils: Pupils are equal, round, and reactive to light.  Neck:     Musculoskeletal: Neck supple.  Cardiovascular:     Rate and Rhythm: Normal rate and regular rhythm.     Heart sounds: No murmur.  Pulmonary:     Effort: Pulmonary effort is normal. No respiratory distress.     Breath sounds: Normal breath sounds.  Abdominal:     Palpations: Abdomen is soft.     Tenderness: There is no abdominal tenderness.  Musculoskeletal:     Comments: AV fistula right arm with good thrill.  Skin:    General: Skin is warm and dry.  Neurological:     General: No focal deficit present.     Mental Status: She is alert and oriented to person, place, and time.      ED Treatments / Results  Labs (all labs ordered are listed, but only abnormal results are displayed) Labs Reviewed  BASIC METABOLIC PANEL - Abnormal; Notable for the following components:      Result Value   Potassium 5.4 (*)    Chloride 97 (*)    Glucose, Bld 159 (*)    BUN 66 (*)    Creatinine, Ser 9.09 (*)    GFR calc non Af Amer 4 (*)    GFR calc Af Amer 5 (*)    All other components within normal limits  CBC - Abnormal; Notable for the following components:   WBC 3.9 (*)    RBC 2.13 (*)    Hemoglobin 6.7 (*)    HCT 21.9 (*)    MCV 102.8 (*)    RDW 20.0 (*)    Platelets 131 (*)    All other components within normal limits  TROPONIN I (HIGH SENSITIVITY) - Abnormal; Notable for the following components:   Troponin I (High Sensitivity) 18 (*)    All other components within normal limits  SARS CORONAVIRUS 2 (TAT 6-24 HRS)  TYPE AND SCREEN  PREPARE RBC (CROSSMATCH)  TROPONIN I (HIGH SENSITIVITY)   Results for orders placed or performed during the hospital encounter of 83/15/17  Basic metabolic panel  Result Value Ref Range    Sodium 136 135 - 145 mmol/L   Potassium 5.4 (H) 3.5 - 5.1 mmol/L   Chloride 97 (L) 98 - 111 mmol/L   CO2 25 22 - 32 mmol/L   Glucose, Bld 159 (H) 70 - 99 mg/dL   BUN 66 (H) 8 - 23 mg/dL   Creatinine, Ser 9.09 (H) 0.44 - 1.00 mg/dL   Calcium 8.9 8.9 - 10.3 mg/dL   GFR calc non Af Amer 4 (L) >60 mL/min   GFR calc Af Amer 5 (L) >60 mL/min   Anion gap 14 5 - 15  CBC  Result Value Ref Range   WBC 3.9 (L) 4.0 - 10.5 K/uL   RBC 2.13 (L) 3.87 - 5.11 MIL/uL   Hemoglobin 6.7 (LL) 12.0 - 15.0 g/dL   HCT 21.9 (L) 36.0 - 46.0 %   MCV 102.8 (H) 80.0 - 100.0 fL   MCH 31.5 26.0 - 34.0 pg   MCHC 30.6 30.0 - 36.0 g/dL   RDW  20.0 (H) 11.5 - 15.5 %   Platelets 131 (L) 150 - 400 K/uL   nRBC 0.0 0.0 - 0.2 %  Type and screen [facility]  Result Value Ref Range   ABO/RH(D) O POS    Antibody Screen POS    Sample Expiration      08/09/2019,2359 Performed at [facility], [address], [place], [place] [address]   Prepare RBC  Result Value Ref Range   Order Confirmation      ORDER PROCESSED BY BLOOD BANK Performed at [facility], [address], [place], [place] [address]   Troponin I (High Sensitivity)  Result Value Ref Range   Troponin I (High Sensitivity) 17 <18 ng/L  Troponin I (High Sensitivity)  Result Value Ref Range   Troponin I (High Sensitivity) 18 (H) <18 ng/L     EKG EKG Interpretation  Date/Time:  Monday August 06 2019 10:39:46 EST Ventricular Rate:  76 PR Interval:    QRS Duration: 103 QT Interval:  399 QTC Calculation: 449 R Axis:   -5 Text Interpretation: Sinus rhythm Abnormal T, consider ischemia, lateral leads Confirmed by Fredia Sorrow [phone]) on 08/06/2019 10:47:07 AM   Radiology Dg Chest Portable 1 View  Result Date: 08/06/2019 CLINICAL DATA:  Chest pain EXAM: PORTABLE CHEST 1 VIEW COMPARISON:  July 04, 2019 FINDINGS: Suboptimal evaluation due to body habitus and portable technique. Chronic interstitial prominence with possible superimposed  interstitial edema. Probable atelectasis at the right lung base. No pleural effusion. Stable cardiomegaly. IMPRESSION: Chronic interstitial prominence with possible superimposed interstitial edema. Probable right basilar atelectasis. Electronically Signed   By: [place] Mis M.D.   On: 08/06/2019 11:13    Procedures Procedures (including critical care time)  CRITICAL CARE Performed by: Fredia Sorrow Total critical care time: 30 minutes Critical care time was exclusive of separately billable procedures and treating other patients. Critical care was necessary to treat or prevent imminent or life-threatening deterioration. Critical care was time spent personally by me on the following activities: development of treatment plan with patient and/or surrogate as well as nursing, discussions with consultants, evaluation of patient's response to treatment, examination of patient, obtaining history from patient or surrogate, ordering and performing treatments and interventions, ordering and review of laboratory studies, ordering and review of radiographic studies, pulse oximetry and re-evaluation of patient's condition.   Medications Ordered in ED Medications  0.9 %  sodium chloride infusion (has no administration in time range)     Initial Impression / Assessment and Plan / ED Course  I have reviewed the triage vital signs and the nursing notes.  Pertinent labs & imaging results that were available during my care of the patient were reviewed by me and considered in my medical decision making (see chart for details).      Patient with significant anemia again hemoglobin 6.7.  Patient will require 2 unit blood transfusion.  This may explain her symptoms.  Chest pain has resolved.  Troponins x2 without any significant elevation or change.  Unlikely to be acute cardiac event.  I have ordered 2 units of blood to be transfused.  Discussed with nephrology.  They are planning on dialysis tomorrow.   Patient in no acute distress.    Final Clinical Impressions(s) / ED Diagnoses   Final diagnoses:  Symptomatic anemia  Precordial pain  ESRD on dialysis [facility])    ED Discharge Orders    None       Fredia Sorrow, MD 08/06/19 1537

## 2019-08-06 NOTE — ED Notes (Signed)
Date and time results received: 08/06/19 11:48 AM    Test: Hbg Critical Value: 6.7  Name of Provider Notified: Dr. [name]  Orders Received? Or Actions Taken?: see orders

## 2019-08-06 NOTE — ED Notes (Signed)
Spoke with Blood Bank, awaiting test results form [facility], lab to call when blood is ready.

## 2019-08-06 NOTE — ED Triage Notes (Signed)
PT sent by [facility] from [facility] in [place] today for c/o center chest pain radiated to her arm and into jaw with relief from 4 (81mg ) aspirin given at facility. PT states pain is better than it was but still has been with a deep breathe in. PT was supposed to go to dialysis this am and had full treatment at dialysis on Friday (08/03/2019).

## 2019-08-07 DIAGNOSIS — Z66 Do not resuscitate: Secondary | ICD-10-CM | POA: Diagnosis present

## 2019-08-07 DIAGNOSIS — Z6841 Body Mass Index (BMI) 40.0 and over, adult: Secondary | ICD-10-CM | POA: Diagnosis not present

## 2019-08-07 DIAGNOSIS — R519 Headache, unspecified: Secondary | ICD-10-CM | POA: Diagnosis present

## 2019-08-07 DIAGNOSIS — E1142 Type 2 diabetes mellitus with diabetic polyneuropathy: Secondary | ICD-10-CM | POA: Diagnosis present

## 2019-08-07 DIAGNOSIS — D649 Anemia, unspecified: Secondary | ICD-10-CM | POA: Diagnosis not present

## 2019-08-07 DIAGNOSIS — K766 Portal hypertension: Secondary | ICD-10-CM | POA: Diagnosis present

## 2019-08-07 DIAGNOSIS — K219 Gastro-esophageal reflux disease without esophagitis: Secondary | ICD-10-CM | POA: Diagnosis present

## 2019-08-07 DIAGNOSIS — Z992 Dependence on renal dialysis: Secondary | ICD-10-CM

## 2019-08-07 DIAGNOSIS — R195 Other fecal abnormalities: Secondary | ICD-10-CM | POA: Diagnosis not present

## 2019-08-07 DIAGNOSIS — J309 Allergic rhinitis, unspecified: Secondary | ICD-10-CM | POA: Diagnosis present

## 2019-08-07 DIAGNOSIS — F419 Anxiety disorder, unspecified: Secondary | ICD-10-CM | POA: Diagnosis present

## 2019-08-07 DIAGNOSIS — E875 Hyperkalemia: Secondary | ICD-10-CM | POA: Diagnosis not present

## 2019-08-07 DIAGNOSIS — D631 Anemia in chronic kidney disease: Secondary | ICD-10-CM | POA: Diagnosis present

## 2019-08-07 DIAGNOSIS — I120 Hypertensive chronic kidney disease with stage 5 chronic kidney disease or end stage renal disease: Secondary | ICD-10-CM | POA: Diagnosis present

## 2019-08-07 DIAGNOSIS — N186 End stage renal disease: Secondary | ICD-10-CM | POA: Diagnosis present

## 2019-08-07 DIAGNOSIS — F17210 Nicotine dependence, cigarettes, uncomplicated: Secondary | ICD-10-CM | POA: Diagnosis present

## 2019-08-07 DIAGNOSIS — N2581 Secondary hyperparathyroidism of renal origin: Secondary | ICD-10-CM | POA: Diagnosis present

## 2019-08-07 DIAGNOSIS — D500 Iron deficiency anemia secondary to blood loss (chronic): Secondary | ICD-10-CM | POA: Diagnosis present

## 2019-08-07 DIAGNOSIS — E8770 Fluid overload, unspecified: Secondary | ICD-10-CM | POA: Diagnosis present

## 2019-08-07 DIAGNOSIS — K317 Polyp of stomach and duodenum: Secondary | ICD-10-CM | POA: Diagnosis not present

## 2019-08-07 DIAGNOSIS — K3189 Other diseases of stomach and duodenum: Secondary | ICD-10-CM | POA: Diagnosis not present

## 2019-08-07 DIAGNOSIS — K2100 Gastro-esophageal reflux disease with esophagitis, without bleeding: Secondary | ICD-10-CM | POA: Diagnosis not present

## 2019-08-07 DIAGNOSIS — F329 Major depressive disorder, single episode, unspecified: Secondary | ICD-10-CM | POA: Diagnosis present

## 2019-08-07 DIAGNOSIS — R072 Precordial pain: Secondary | ICD-10-CM | POA: Diagnosis present

## 2019-08-07 DIAGNOSIS — E669 Obesity, unspecified: Secondary | ICD-10-CM | POA: Diagnosis present

## 2019-08-07 DIAGNOSIS — K31819 Angiodysplasia of stomach and duodenum without bleeding: Secondary | ICD-10-CM | POA: Diagnosis present

## 2019-08-07 DIAGNOSIS — Z20828 Contact with and (suspected) exposure to other viral communicable diseases: Secondary | ICD-10-CM | POA: Diagnosis present

## 2019-08-07 DIAGNOSIS — Z961 Presence of intraocular lens: Secondary | ICD-10-CM | POA: Diagnosis present

## 2019-08-07 DIAGNOSIS — K921 Melena: Secondary | ICD-10-CM | POA: Diagnosis present

## 2019-08-07 DIAGNOSIS — G44221 Chronic tension-type headache, intractable: Secondary | ICD-10-CM | POA: Diagnosis not present

## 2019-08-07 DIAGNOSIS — D61818 Other pancytopenia: Secondary | ICD-10-CM | POA: Diagnosis present

## 2019-08-07 DIAGNOSIS — M109 Gout, unspecified: Secondary | ICD-10-CM | POA: Diagnosis present

## 2019-08-07 LAB — SARS CORONAVIRUS 2 (TAT 6-24 HRS): SARS Coronavirus 2: NEGATIVE

## 2019-08-07 LAB — BASIC METABOLIC PANEL
Anion gap: 12 (ref 5–15)
BUN: 75 mg/dL — ABNORMAL HIGH (ref 8–23)
CO2: 23 mmol/L (ref 22–32)
Calcium: 8.2 mg/dL — ABNORMAL LOW (ref 8.9–10.3)
Chloride: 100 mmol/L (ref 98–111)
Creatinine, Ser: 9.64 mg/dL — ABNORMAL HIGH (ref 0.44–1.00)
GFR calc Af Amer: 4 mL/min — ABNORMAL LOW (ref >60)
GFR calc non Af Amer: 4 mL/min — ABNORMAL LOW (ref >60)
Glucose, Bld: 135 mg/dL — ABNORMAL HIGH (ref 70–99)
Potassium: 5.9 mmol/L — ABNORMAL HIGH (ref 3.5–5.1)
Sodium: 135 mmol/L (ref 135–145)

## 2019-08-07 LAB — GLUCOSE, CAPILLARY
Glucose-Capillary: 100 mg/dL — ABNORMAL HIGH (ref 70–99)
Glucose-Capillary: 155 mg/dL — ABNORMAL HIGH (ref 70–99)
Glucose-Capillary: 201 mg/dL — ABNORMAL HIGH (ref 70–99)
Glucose-Capillary: 65 mg/dL — ABNORMAL LOW (ref 70–99)
Glucose-Capillary: 69 mg/dL — ABNORMAL LOW (ref 70–99)
Glucose-Capillary: 70 mg/dL (ref 70–99)
Glucose-Capillary: 86 mg/dL (ref 70–99)

## 2019-08-07 LAB — CBC
HCT: 25.7 % — ABNORMAL LOW (ref 36.0–46.0)
Hemoglobin: 8.2 g/dL — ABNORMAL LOW (ref 12.0–15.0)
MCH: 30.6 pg (ref 26.0–34.0)
MCHC: 31.9 g/dL (ref 30.0–36.0)
MCV: 95.9 fL (ref 80.0–100.0)
Platelets: 115 10*3/uL — ABNORMAL LOW (ref 150–400)
RBC: 2.68 MIL/uL — ABNORMAL LOW (ref 3.87–5.11)
RDW: 20.2 % — ABNORMAL HIGH (ref 11.5–15.5)
WBC: 3.4 10*3/uL — ABNORMAL LOW (ref 4.0–10.5)
nRBC: 0 % (ref 0.0–0.2)

## 2019-08-07 LAB — OCCULT BLOOD X 1 CARD TO LAB, STOOL: Fecal Occult Bld: POSITIVE — AB

## 2019-08-07 LAB — LACTATE DEHYDROGENASE: LDH: 164 U/L (ref 98–192)

## 2019-08-07 MED ORDER — SODIUM CHLORIDE 0.9 % IV SOLN: 100.0000 mL | INTRAVENOUS | Status: DC | PRN

## 2019-08-07 MED ORDER — DEXTROSE 50 % IV SOLN
INTRAVENOUS | Status: AC
  Filled 2019-08-07: qty 50

## 2019-08-07 MED ORDER — PENTAFLUOROPROP-TETRAFLUOROETH EX AERO: 1.0000 "application " | INHALATION_SPRAY | CUTANEOUS | Status: DC | PRN

## 2019-08-07 MED ORDER — DEXTROSE 50 % IV SOLN: 12.5000 g | INTRAVENOUS | Status: AC

## 2019-08-07 MED ORDER — INSULIN GLARGINE 100 UNIT/ML ~~LOC~~ SOLN
10.0000 [IU] | Freq: Every day | SUBCUTANEOUS | Status: DC
  Administered 2019-08-07 – 2019-08-09 (×3): 10 [IU] via SUBCUTANEOUS
  Filled 2019-08-07 (×4): qty 0.1

## 2019-08-07 MED ORDER — LIDOCAINE HCL (PF) 1 % IJ SOLN: 5.0000 mL | INTRAMUSCULAR | Status: DC | PRN

## 2019-08-07 MED ORDER — LIDOCAINE-PRILOCAINE 2.5-2.5 % EX CREA: 1.0000 "application " | TOPICAL_CREAM | CUTANEOUS | Status: DC | PRN

## 2019-08-07 MED ORDER — OXYCODONE HCL 5 MG PO TABS
2.5000 mg | ORAL_TABLET | ORAL | Status: AC | PRN
  Administered 2019-08-07 – 2019-08-08 (×3): 2.5 mg via ORAL
  Filled 2019-08-07 (×3): qty 1

## 2019-08-07 MED ORDER — CHLORHEXIDINE GLUCONATE CLOTH 2 % EX PADS
6.0000 | MEDICATED_PAD | Freq: Every day | CUTANEOUS | Status: DC
  Administered 2019-08-09: 05:00:00 6 via TOPICAL

## 2019-08-07 MED ORDER — ACETAMINOPHEN 500 MG PO TABS
1000.0000 mg | ORAL_TABLET | Freq: Four times a day (QID) | ORAL | Status: DC | PRN
  Administered 2019-08-08 – 2019-08-09 (×2): 1000 mg via ORAL
  Filled 2019-08-07 (×2): qty 2

## 2019-08-07 NOTE — Consult Note (Signed)
Referring Provider: Irwin Brakeman, MD Primary Care Physician:  Scotty Court, DO Primary Gastroenterologist:  Dr. 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  Patient was scheduled to undergo hemodialysis but instead she was brought to emergency room by EMS.  High sensitivity troponin I level was normal at 17 but her hemoglobin was 6.7 g.  Patient was recently hospitalized on 07/04/2019 with hemoglobin of 6.8 g and received 2 units of PRBCs and predischarge hemoglobin was 9.4 g on 07/05/2019. Patient was therefore hospitalized for further evaluation.  Patient has received 2 units of PRBCs and hemoglobin is up to 8.2 g.  Repeat high-sensitivity troponin I level was 18.  Dr. Wynetta Emery does not feel that we are dealing with myocardial infarct.  Patient was seen by Dr. Rosita Fire nephrology service and she is undergoing hemodialysis.  Patient denies nausea vomiting or abdominal pain.  She has occasional heartburn with certain foods.  Her bowels move daily.  She states she passes formed stool.  However she cannot tell the color because of visual impairment.  Staff at assisted living has not reported melena or rectal bleeding.  She has good appetite.  She does experience shortness of breath with minimal exertion like she went to the bathroom to urinate and by the time she got to her bed she was short of breath.  She complains of retrosternal pain only when she takes a deep breath. She was given low-dose aspirin prior to her  arrival in the emergency room but she does not take any NSAIDs on schedule.  He used to be on anticoagulant which was discontinued. No history of hematuria or vaginal bleeding.  As far as patient's GI history is concerned she was first evaluated by [place] back in March 2017 for heme positive stools.  She has undergone multiple studies which are summarized below. She has been diagnosed with iron deficiency anemia in the past but iron studies dating back to August 2015 do not show any such abnormality.  Her saturation was 23% in February 2018 and serum iron was 45.  On other occasions serum iron saturation has been higher and her ferritin has always been normal.   12/11/2015  Colonoscopy revealed sigmoid diverticulosis and 2 colonic adenomas.  2 other polyps were non-adenomatous.  03/11/2016  Esophagogastroduodenoscopy revealed antral mucosal nodularity.  Biopsy revealed hyperplastic polyp.  Of bleed.  Duodenal pigmentation.  Biopsy revealed iron pigment/hemosiderin.  11/09/2016  Small bowel Given capsule study revealed specks of blood and coffee-ground material coating gastric mucosa, extensive hemosiderin pigmentation of duodenal mucosa but no small bowel lesions noted.  07/21/2017  Esophagogastroduodenoscopy revealed small esophageal squamous papilloma, diminutive gastric polyps portal hypertensive gastropathy and mild gave without bleeding.  11/10/2018  Small bowel given capsule study revealed gastritis with specks of blood and coffee-ground material coating the gastric mucosa.  Once again duodenal iron pigmentation noted.  11/30/2018  Esophagogastroduodenoscopy revealed gave without bleeding.  More than 50% the lesions were treated with APC.  Small gastric polyp noted.  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  Once again there was no spontaneous bleeding from these lesions.   Past Medical History:  Diagnosis Date  . Anemia   .  Anxiety   . Arthritis   . Chronic kidney disease    neuphrotic syndrome-05/2013  . Constipation   . Decreased vision both eyes    Vit D deficiency  . Depression   . Diabetes mellitus    Type 2  . Diabetic neuropathy ([place])   . Diverticulitis   . Dry skin   . GERD (gastroesophageal reflux disease)   . GI bleed 07/05/2017  . Gout   . Headache   . Hypertension   . Neuropathy   . Chronic dyspnea   . Skin cancer     Past Surgical History:  Procedure Laterality Date  . ABDOMINAL HYSTERECTOMY    . ADENOIDECTOMY    . APPENDECTOMY     taken out with hysterectomy  . AV FISTULA PLACEMENT Right 09/13/2014   Procedure: Creation of Right Arm RADIOCEPHALIC ARTERIOVENOUS (AV) FISTULA ;  Surgeon: [name], MD;  Location: [facility];  Service: Vascular;  Laterality: Right;  . BACK SURGERY     lumbar -   . CATARACT EXTRACTION W/PHACO Left 07/02/2013   Procedure: CATARACT EXTRACTION PHACO AND INTRAOCULAR LENS PLACEMENT (IOC);  Surgeon: [name], MD;  Location: [facility];  Service: Ophthalmology;  Laterality: Left;  CDE:  6.46  . CATARACT EXTRACTION W/PHACO Right 07/12/2013   Procedure: CATARACT EXTRACTION PHACO AND INTRAOCULAR LENS PLACEMENT (IOC);  Surgeon: [name], MD;  Location: [facility];  Service: Ophthalmology;  Laterality: Right;  CDE:12.48  . COLONOSCOPY N/A 12/11/2015   Procedure: COLONOSCOPY;  Surgeon: [name], MD;  [facility];  Service: Endoscopy;  Laterality: N/A;  1:55  . ESOPHAGOGASTRODUODENOSCOPY N/A 03/11/2016   Procedure: ESOPHAGOGASTRODUODENOSCOPY (EGD);  Surgeon: [name], MD;  [facility];  Service: Endoscopy;  Laterality: N/A;  1230 - Last pt of the day-Dr. Laural Golden going to office to see pt's  . ESOPHAGOGASTRODUODENOSCOPY N/A 07/21/2017   Procedure: ESOPHAGOGASTRODUODENOSCOPY (EGD);  Surgeon: [name], MD;  [facility];  Service: Endoscopy;  Laterality: N/A;  3:00  . ESOPHAGOGASTRODUODENOSCOPY N/A 11/30/2018   Procedure:  ESOPHAGOGASTRODUODENOSCOPY (EGD);  Surgeon: [name], MD;  [facility];  Service: Endoscopy;  Laterality: N/A;  9:55 - spoke with the facility and advised new arrival time 10:20  . ESOPHAGOGASTRODUODENOSCOPY N/A 01/11/2019   Procedure: ESOPHAGOGASTRODUODENOSCOPY (EGD);  Surgeon: [name], MD;  [facility];  Service: Endoscopy;  Laterality: N/A;  155 - office spoke with facility and they know to have pt here at 11:00  . FOOT SURGERY Right    x2-heel spur   . GIVENS CAPSULE STUDY N/A 11/09/2016   Procedure: GIVENS CAPSULE STUDY;  Surgeon: [name], MD;  [facility];  Service: Endoscopy;  Laterality: N/A;  7:30  . GIVENS CAPSULE STUDY N/A 11/10/2018   Procedure: GIVENS CAPSULE STUDY;  Surgeon: [name], MD;  [facility];  Service: Endoscopy;  Laterality: N/A;  . REVISON OF ARTERIOVENOUS FISTULA Right 01/05/2016   Procedure: REVISON OF ARTERIOVENOUS FISTULA- right forearm;  Surgeon: [name], MD;  Location: [place];  Service: Vascular;  Laterality: Right;  . SKIN CANCER EXCISION     forehead  . TONSILLECTOMY      Prior to Admission medications   Medication Sig Start Date End Date Taking? Authorizing Provider  acetaminophen (TYLENOL) 500 MG  tablet Take 1,000 mg by mouth every 6 (six) hours as needed for mild pain (FOR HEADACHES/MINOR DISCOMFORT).    Yes [provider]  ALPRAZolam (XANAX) 0.25 MG tablet Take 0.25 mg by mouth 2 (two) times daily.    Yes [provider]  aluminum-magnesium hydroxide-simethicone (MAALOX) 200-200-20 MG/5ML SUSP Take 30 mLs by mouth 4 (four) times daily as needed (heartburn/indigestion).   Yes [provider]  amLODipine (NORVASC) 5 MG tablet Take 1 tablet (5 mg total) by mouth at bedtime. 02/28/19  Yes Emokpae, Courage, MD  b complex vitamins capsule Take 1 capsule by mouth daily.    Yes [provider]  buPROPion (WELLBUTRIN) 75 MG tablet Take 75 mg by mouth  daily.    Yes [provider]  carvedilol (COREG) 6.25 MG tablet Take 1 tablet (6.25 mg total) by mouth 2 (two) times a day. 02/28/19  Yes Emokpae, Courage, MD  cholecalciferol (VITAMIN D3) 25 MCG (1000 UT) tablet Take 1,000 Units by mouth daily.   Yes [provider]  fexofenadine (ALLEGRA) 180 MG tablet Take 180 mg by mouth daily.    Yes [provider]  furosemide (LASIX) 20 MG tablet Take 20 mg by mouth daily as needed for edema.   Yes [provider]  furosemide (LASIX) 40 MG tablet Take 40 mg by mouth daily.   Yes [provider]  gabapentin (NEURONTIN) 300 MG capsule Take 300-600 mg by mouth See admin instructions. Patient takes 371m at 0800 and at 1400; patient then takes 6066mat 2000   Yes [provider]  glipiZIDE (GLUCOTROL) 5 MG tablet Take 1 tablet (5 mg total) by mouth 2 (two) times a day. 02/28/19  Yes Emokpae, Courage, MD  guaiFENesin-codeine (ROBITUSSIN AC) 100-10 MG/5ML syrup Take 5 mLs by mouth 4 (four) times daily as needed for cough.   Yes [provider]  hydroxypropyl methylcellulose / hypromellose (ISOPTO TEARS / GONIOVISC) 2.5 % ophthalmic solution Place 1 drop into both eyes every 4 (four) hours as needed for dry eyes.    Yes [provider]  insulin lispro (HUMALOG) 100 UNIT/ML KiwkPen Inject 10 Units into the skin 3 (three) times daily before meals. *Do Not to inject for levels below 100   Yes [provider]  loperamide (IMODIUM) 2 MG capsule Take 1 capsule (2 mg total) by mouth 3 (three) times daily before meals. Patient taking differently: Take 2 mg by mouth daily.  06/05/19  Yes Rehman, NaMechele DawleyMD  Melatonin 5 MG CAPS Take 10 mg by mouth at bedtime.    Yes [provider]  pantoprazole (PROTONIX) 40 MG tablet Take 1 tablet (40 mg total) by mouth 2 (two) times daily. 11/10/18  Yes MaBarton DuboisMD  sertraline (ZOLOFT) 50 MG tablet Take 75 mg by mouth daily.    Yes [provider]  sevelamer carbonate (RENVELA) 800 MG tablet Take 1,600-2,400 mg by mouth See admin instructions. 2400 mg 3 times daily with meals and 1600 mg twice daily with snacks   Yes [provider]  traZODone (DESYREL) 50 MG tablet Take 25 mg by mouth at bedtime as needed for sleep.   Yes [provider]  TRESIBA FLEXTOUCH 100 UNIT/ML SOPN FlexTouch Pen Inject 50 Units into the skin every morning.  06/26/19  Yes [provider]    Current Facility-Administered Medications  Medication Dose Route Frequency Provider Last Rate Last Dose  . 0.9 %  sodium chloride infusion  10 mL/hr Intravenous Once  Fredia Sorrow, MD      . acetaminophen (TYLENOL) tablet 1,000 mg  1,000 mg Oral Q6H PRN Johnson, Clanford L, MD      . ALPRAZolam Duanne Moron) tablet 0.25 mg  0.25 mg Oral BID Emokpae, Ejiroghene E, MD   0.25 mg at 08/07/19 1257  . amLODipine (NORVASC) tablet 5 mg  5 mg Oral QHS Emokpae, Ejiroghene E, MD      . buPROPion (WELLBUTRIN) tablet 75 mg  75 mg Oral Daily Emokpae, Ejiroghene E, MD   75 mg at 08/07/19 1257  . carvedilol (COREG) tablet 6.25 mg  6.25 mg Oral BID WC Emokpae, Ejiroghene E, MD   6.25 mg at 08/07/19 0836  . Chlorhexidine Gluconate Cloth 2 % PADS 6 each  6 each Topical Q0600 Rosita Fire, MD      . dextrose 50 % solution 12.5 g  12.5 g Intravenous STAT Johnson, Clanford L, MD      . dextrose 50 % solution           . furosemide (LASIX) tablet 40 mg  40 mg Oral Daily Emokpae, Ejiroghene E, MD   40 mg at 08/07/19 1257  . insulin aspart (novoLOG) injection 0-9 Units  0-9 Units Subcutaneous TID WC Emokpae, Ejiroghene E, MD      . insulin glargine (LANTUS) injection 10 Units  10 Units Subcutaneous Daily Wynetta Emery, Clanford L, MD   10 Units at 08/07/19 1257  . ondansetron (ZOFRAN) tablet 4 mg  4 mg Oral Q6H PRN Emokpae, Ejiroghene E, MD       Or  . ondansetron (ZOFRAN) injection 4 mg  4 mg Intravenous Q6H PRN Emokpae, Ejiroghene E, MD      . oxyCODONE  (Oxy IR/ROXICODONE) immediate release tablet 2.5 mg  2.5 mg Oral Q4H PRN Johnson, Clanford L, MD      . pantoprazole (PROTONIX) EC tablet 40 mg  40 mg Oral BID Emokpae, Ejiroghene E, MD   40 mg at 08/07/19 1257  . polyethylene glycol (MIRALAX / GLYCOLAX) packet 17 g  17 g Oral Daily PRN Emokpae, Ejiroghene E, MD      . sertraline (ZOLOFT) tablet 75 mg  75 mg Oral Daily Emokpae, Ejiroghene E, MD   75 mg at 08/07/19 1255  . sevelamer carbonate (RENVELA) tablet 2,400 mg  2,400 mg Oral TID WC Emokpae, Ejiroghene E, MD   2,400 mg at 08/07/19 0839   And  . sevelamer carbonate (RENVELA) tablet 1,600 mg  1,600 mg Oral BID Emokpae, Ejiroghene E, MD   1,600 mg at 08/07/19 1256  . traZODone (DESYREL) tablet 25 mg  25 mg Oral QHS PRN Emokpae, Ejiroghene E, MD        Allergies as of 08/06/2019 - Review Complete 08/06/2019  Allergen Reaction Noted  . Penicillins Rash     Family History  Problem Relation Age of Onset  . Multiple myeloma Mother   . Diabetes Mother   . Cirrhosis Mother   . Heart attack Father 32  . Colon cancer Neg Hx     Social History   Socioeconomic History  . Marital status: Divorced    Spouse name: Not on file  . Number of children: Not on file  . Years of education: Not on file  . Highest education level: Not on file  Occupational History  . Not on file  Social Needs  . Financial resource strain: Not hard at all  . Food insecurity    Worry: Never true    Inability:  Never true  . Transportation needs    Medical: No    Non-medical: No  Tobacco Use  . Smoking status: Current Every Day Smoker    Packs/day: 0.25    Years: 40.00    Pack years: 10.00    Types: Cigarettes  . Smokeless tobacco: Never Used  Substance and Sexual Activity  . Alcohol use: No    Alcohol/week: 0.0 standard drinks  . Drug use: No  . Sexual activity: Yes    Birth control/protection: Surgical  Lifestyle  . Physical activity    Days per week: 0 days    Minutes per session: 0 min  .  Stress: Only a little  Relationships  . Social connections    Talks on phone: More than three times a week    Gets together: Twice a week    Attends religious service: Never    Active member of club or organization: No    Attends meetings of clubs or organizations: Never    Relationship status: Widowed  . Intimate partner violence    Fear of current or ex partner: No    Emotionally abused: No    Physically abused: No    Forced sexual activity: No  Other Topics Concern  . Not on file  Social History Narrative   No children    Review of Systems: See HPI, otherwise normal ROS  Physical Exam: Temp:  [97.4 F (36.3 C)-98.4 F (36.9 C)] 97.7 F (36.5 C) (12/01 1048) Pulse Rate:  [62-75] 65 (12/01 1048) Resp:  [13-20] 18 (12/01 1048) BP: (140-182)/(46-89) 161/71 (12/01 1048) SpO2:  [90 %-100 %] 98 % (12/01 1048) Last BM Date: 08/06/19    Patient is alert and appears to be dyspneic as she walked back from the bathroom. Conjunctiva is pale.  Sclerae nonicteric. Oropharyngeal mucosa is normal.  Dentition in satisfactory condition.  Few teeth are missing Neck without masses thyromegaly or lymphadenopathy. Cardiac exam with regular rhythm normal S1 and S2.  She has grade 2/6 systolic murmur best heard at aortic area. Auscultation of lungs reveal vesicular breath sounds with few scattered expiratory rhonchi. Abdomen is obese.  She has Pfannenstiel scar.  Bowel sounds are normal.  On palpation abdomen is soft and nontender with organomegaly or masses She has 1+ pitting edema involving both legs.  She does not have clubbing or koilonychia.  Lab Results: Recent Labs    08/06/19 1121  WBC 3.9*  HGB 6.7*  HCT 21.9*  PLT 131*   BMET Recent Labs    08/06/19 1121  NA 136  K 5.4*  CL 97*  CO2 25  GLUCOSE 159*  BUN 66*  CREATININE 9.09*  CALCIUM 8.9    Studies/Results: Dg Chest Portable 1 View  Result Date: 08/06/2019 CLINICAL DATA:  Chest pain EXAM: PORTABLE CHEST 1  VIEW COMPARISON:  July 04, 2019 FINDINGS: Suboptimal evaluation due to body habitus and portable technique. Chronic interstitial prominence with possible superimposed interstitial edema. Probable atelectasis at the right lung base. No pleural effusion. Stable cardiomegaly. IMPRESSION: Chronic interstitial prominence with possible superimposed interstitial edema. Probable right basilar atelectasis. Electronically Signed   By: Macy Mis M.D.   On: 08/06/2019 11:13    Assessment;  Patient is 68 year old Caucasian female with multiple medical problems who presents with drop in her H&H again.  Over the last 3 years she has required multiple transfusions.  She has noted to have heme positive stools on multiple occasions but no frank melena or bleeding has ever been  reported.  She has undergone 1 colonoscopy, for esophagogastroduodenoscopy's to small bowel given capsule studies and one GI bleeding scan since April 2017.  GI bleeding scan was performed during hospitalization in August 2020 and was negative.  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  However she has never been documented to be actively bleeding from these lesions in her stomach.  She underwent APC of gastric antral vascular ectasia in March and again in May 2020 but it does not appear to have made any difference as to the need for transfusion. Clearly her anemia is multifactorial in possibly predominantly due to chronic disease.  One also has to entertain other diagnostic possibilities such as low-grade hemolysis and bone marrow dysfunction. If stool is noted to be heme positive would consider esophagogastroduodenoscopy with therapeutic intention.  However if his stool is negative would request hematology consultation.  Recommendations;  Await results stool guaiac. Check serum LDH and haptoglobin. Will offer esophagogastroduodenoscopy if  stool is guaiac positive. Further recommendations to follow.   LOS: 0 days   Najeeb Rehman  08/07/2019, 1:47 PM

## 2019-08-07 NOTE — Progress Notes (Signed)
UNMATCHED BLOOD PRODUCT NOTE  Compare the patient ID on the blood tag to the patient ID on the hospital armband and Blood Bank armband. Then confirm the unit number on the blood tag matches the unit number on the blood product.  If a discrepancy is discovered return the product to blood bank immediately.   Blood Product Type: Packed Red Blood Cells  Unit #: (Found on blood product bag, begins with W) [id number]  Product Code #: (Found on blood product bag, begins with E) [id number]    Start Time: 2003  Starting Rate: 120 ml/hr  Rate increase/decreased  (if applicable):      ml/hr  Rate changed time (if applicable):    Stop Time: Will stop on Oncoming shift  Second Verified by Reather Converse, RN    All Other Documentation should be documented within the Blood Admin Flowsheet per policy.

## 2019-08-07 NOTE — Progress Notes (Signed)
PROGRESS NOTE [facility]   Tanya Lucero  XBJ:[id number]  DOB: 1951-08-20  DOA: 08/06/2019 PCP: Scotty Court, DO   Brief Admission Hx: 68 year old female with ESRD, type 2 diabetes mellitus, hypertension, [address], anxiety depression presented complaining of shortness of breath and left-sided chest pain.  She had missed her last dialysis treatment.  She has had multiple admissions for symptomatic anemia and has received packed red blood cell transfusions.  MDM/Assessment & Plan:   1. Symptomatic anemia -patient is receiving 2 units of packed red blood cells and is feeling much better at this time.  She was resumed on Protonix 40 mg twice daily.  GI consult was requested.  Last EGD was 01/11/2019. 2. Chest pain-likely secondary to volume overload and shortness of breath.  Her troponins have been insignificant. 3. ESRD-patient missed Friday HD session.  Nephrology has been consulted and they have made arrangements for hemodialysis today.  She may also require hemodialysis tomorrow for catch up. 4. Anxiety and depression-chronic meds have been resumed. 5. Type 2 diabetes mellitus with neuropathy-she has been restarted on glipizide and is having some hypoglycemia.  I have discontinued the glipizide and I have started diet.  I think that the glipizide should be discontinued permanently.  I also reduced her basal insulin by 50% to 10 units daily.  She is on SSI supplemental insulin coverage program.  We will continue to follow CBG. 6. Essential hypertension-poorly controlled likely by volume overload.  She has been restarted on her home Coreg Norvasc.  DVT prophylaxis: SCDs Code Status: DNR Family Communication: Patient updated at bedside no family was at the bedside today during rounds Disposition Plan: Inpatient   Consultants:  Nephrology  GI  Procedures:  Hemodialysis  Antimicrobials:     Subjective: Patient says that she feels weak but the blood is helping.  She is  hungry and wants to eat.  Objective: Vitals:   08/07/19 1048 08/07/19 1320 08/07/19 1330 08/07/19 1400  BP: (!) 161/71 (!) 195/85 (!) 172/69 (!) 183/79  Pulse: 65 68 70 64  Resp: 18 16    Temp: 97.7 F (36.5 C) 97.8 F (36.6 C)    TempSrc: Oral Oral    SpO2: 98% 97%    Weight:  99.4 kg    Height:        Intake/Output Summary (Last 24 hours) at 08/07/2019 1410 Last data filed at 08/07/2019 0800 Gross per 24 hour  Intake 315 ml  Output --  Net 315 ml   Filed Weights   08/06/19 1032 08/07/19 1320  Weight: 98.9 kg 99.4 kg     REVIEW OF SYSTEMS  As per history otherwise all reviewed and reported negative  Exam:  General exam: Elderly female chronically ill-appearing she is awake and alert sitting up in bed.  No apparent distress at this time. Respiratory system: Bibasilar crackles heard. No increased work of breathing. Cardiovascular system: S1 & S2 heard. No JVD, murmurs, gallops, clicks or pedal edema. Gastrointestinal system: Abdomen is nondistended, soft and nontender. Normal bowel sounds heard. Central nervous system: Alert and oriented. No focal neurological deficits. Extremities: 1+ edema bilateral lower extremities.  Data Reviewed: Basic Metabolic Panel: Recent Labs  Lab 08/06/19 1121  NA 136  K 5.4*  CL 97*  CO2 25  GLUCOSE 159*  BUN 66*  CREATININE 9.09*  CALCIUM 8.9   Liver Function Tests: No results for input(s): AST, ALT, ALKPHOS, BILITOT, PROT, ALBUMIN in the last 168 hours. No results for input(s): LIPASE,  AMYLASE in the last 168 hours. No results for input(s): AMMONIA in the last 168 hours. CBC: Recent Labs  Lab 08/06/19 1121 08/07/19 1347  WBC 3.9* 3.4*  HGB 6.7* 8.2*  HCT 21.9* 25.7*  MCV 102.8* 95.9  PLT 131* 115*   Cardiac Enzymes: No results for input(s): CKTOTAL, CKMB, CKMBINDEX, TROPONINI in the last 168 hours. CBG (last 3)  Recent Labs    08/07/19 1035 08/07/19 1133 08/07/19 1213  GLUCAP 70 65* 100*   Recent Results  (from the past 240 hour(s))  SARS CORONAVIRUS 2 (TAT 6-24 HRS) Nasopharyngeal Nasopharyngeal Swab     Status: None   Collection Time: 08/06/19 11:59 AM   Specimen: Nasopharyngeal Swab  Result Value Ref Range Status   SARS Coronavirus 2 NEGATIVE NEGATIVE Final    Comment: (NOTE) SARS-CoV-2 target nucleic acids are NOT DETECTED. The SARS-CoV-2 RNA is generally detectable in upper and lower respiratory specimens during the acute phase of infection. Negative results do not preclude SARS-CoV-2 infection, do not rule out co-infections with other pathogens, and should not be used as the sole basis for treatment or other patient management decisions. Negative results must be combined with clinical observations, patient history, and epidemiological information. The expected result is Negative. Fact Sheet for Patients: SugarRoll.be Fact Sheet for Healthcare Providers: https://www.woods-mathews.com/ This test is not yet approved or cleared by the [place] FDA and  has been authorized for detection and/or diagnosis of SARS-CoV-2 by FDA under an Emergency Use Authorization (EUA). This EUA will remain  in effect (meaning this test can be used) for the duration of the COVID-19 declaration under Section 56 4(b)(1) of the Act, 21 U.S.C. section 360bbb-3(b)(1), unless the authorization is terminated or revoked sooner. Performed at [facility], [place] [address] [place], [place] [address]      Studies: Dg Chest Portable 1 View  Result Date: 08/06/2019 CLINICAL DATA:  Chest pain EXAM: PORTABLE CHEST 1 VIEW COMPARISON:  July 04, 2019 FINDINGS: Suboptimal evaluation due to body habitus and portable technique. Chronic interstitial prominence with possible superimposed interstitial edema. Probable atelectasis at the right lung base. No pleural effusion. Stable cardiomegaly. IMPRESSION: Chronic interstitial prominence with possible superimposed  interstitial edema. Probable right basilar atelectasis. Electronically Signed   By: Macy Mis M.D.   On: 08/06/2019 11:13     Scheduled Meds:  ALPRAZolam  0.25 mg Oral BID   amLODipine  5 mg Oral QHS   buPROPion  75 mg Oral Daily   carvedilol  6.25 mg Oral BID WC   Chlorhexidine Gluconate Cloth  6 each Topical Q0600   dextrose  12.5 g Intravenous STAT   dextrose       furosemide  40 mg Oral Daily   insulin aspart  0-9 Units Subcutaneous TID WC   insulin glargine  10 Units Subcutaneous Daily   pantoprazole  40 mg Oral BID   sertraline  75 mg Oral Daily   sevelamer carbonate  2,400 mg Oral TID WC   And   sevelamer carbonate  1,600 mg Oral BID   Continuous Infusions:  sodium chloride     sodium chloride     sodium chloride      Active Problems:   Symptomatic anemia   Hyperkalemia   Time spent:   Irwin Brakeman, MD [facility] 08/07/2019, 2:10 PM    LOS: 0 days  How to contact the [facility] Attending or Consulting provider [place] or covering provider during after hours [place], for this  patient?  1. Check the care team in [facility] and look for a) attending/consulting TRH provider listed and b) the [facility] team listed 2. Log into [email or web address] and use [facility]'s universal password to access. If you do not have the password, please contact the hospital operator. 3. Locate the [facility] provider you are looking for under [facility] and page to a number that you can be directly reached. 4. If you still have difficulty reaching the provider, please page the [facility] (Director on Call) for the Hospitalists listed on amion for assistance.

## 2019-08-07 NOTE — Progress Notes (Signed)
Stool guaiac is positive. We will proceed with esophagogastroduodenoscopy on 08/08/2019.

## 2019-08-07 NOTE — Procedures (Signed)
HEMODIALYSIS TREATMENT NOTE:  4 hour heparin-free treatment completed via right forearm AVF (15g/antegrade).  Goal met: 3.5 liters removed without interruption in ultrafiltration.  All blood was returned and hemostasis was achieved in 15 minutes.  Angela Poteat, RN 

## 2019-08-07 NOTE — Consult Note (Signed)
[facility] Nephrology Consultation Note  Requesting MD: Dr Wynetta Emery, [place] Reason for consult: ESRD on HD  HPI:  Tanya Lucero is a 68 y.o. 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  We are consulted to manage ESRD.  Patient was recently discharged from hospital on 10/29 after admitted for GI bleed when she received blood transfusion.  She reported dark-colored stool.  She follows closely with GI and had EGD done in the past.  She received 2 units of blood transfusion.  Her ESRD she goes to [facility].  The last treatment was on Friday, 11/27.  Right upper extremity AV fistula for the access.  She has SOB however denies chest pain, nausea vomiting headache or dizziness this morning.  Room air.  Blood pressure elevated.  The lab consistent with potassium 5.4, BUN 66, hemoglobin 6.7.  Chest x-ray shows possible interstitial edema.  PMHx:   Past Medical History:  Diagnosis Date  . Anemia   . Anxiety   . Arthritis   . Chronic kidney disease    neuphrotic syndrome-05/2013  . Constipation   . Decreased vision    left eye  . Depression   . Diabetes mellitus    Type 2  . Diabetic neuropathy ([place])   . Diverticulitis   . Dry skin   . GERD (gastroesophageal reflux disease)   . GI bleed 07/05/2017  . Gout   . Headache   . Hypertension   . Neuropathy   . Shortness of breath dyspnea   . Skin cancer     Past Surgical History:  Procedure Laterality Date  . ABDOMINAL HYSTERECTOMY    . ADENOIDECTOMY    . APPENDECTOMY     taken out with hysterectomy  . AV FISTULA PLACEMENT Right 09/13/2014   Procedure: Creation of Right Arm RADIOCEPHALIC ARTERIOVENOUS (AV) FISTULA ;  Surgeon: [name], MD;  Location: [place];  Service: Vascular;  Laterality: Right;  . BACK SURGERY      lumbar -   . CATARACT EXTRACTION W/PHACO Left 07/02/2013   Procedure: CATARACT EXTRACTION PHACO AND INTRAOCULAR LENS PLACEMENT (IOC);  Surgeon: [name], MD;  [facility];  Service: Ophthalmology;  Laterality: Left;  CDE:  6.46  . CATARACT EXTRACTION W/PHACO Right 07/12/2013   Procedure: CATARACT EXTRACTION PHACO AND INTRAOCULAR LENS PLACEMENT (IOC);  Surgeon: [name], MD;  Location: [facility];  Service: Ophthalmology;  Laterality: Right;  CDE:12.48  . COLONOSCOPY N/A 12/11/2015   Procedure: COLONOSCOPY;  Surgeon: [name], MD;  [facility];  Service: Endoscopy;  Laterality: N/A;  1:55  . ESOPHAGOGASTRODUODENOSCOPY N/A 03/11/2016   Procedure: ESOPHAGOGASTRODUODENOSCOPY (EGD);  Surgeon: [name], MD;  [facility];  Service: Endoscopy;  Laterality: N/A;  1230 - Last pt of the day-Dr. Laural Golden going to office to see pt's  . ESOPHAGOGASTRODUODENOSCOPY N/A 07/21/2017   Procedure: ESOPHAGOGASTRODUODENOSCOPY (EGD);  Surgeon: [name], MD;  [facility];  Service: Endoscopy;  Laterality: N/A;  3:00  . ESOPHAGOGASTRODUODENOSCOPY N/A 11/30/2018   Procedure: ESOPHAGOGASTRODUODENOSCOPY (EGD);  Surgeon: [name], MD;  [facility];  Service: Endoscopy;  Laterality: N/A;  9:55 - spoke with the facility and advised new arrival time 10:20  . ESOPHAGOGASTRODUODENOSCOPY N/A 01/11/2019   Procedure: ESOPHAGOGASTRODUODENOSCOPY (EGD);  Surgeon: [name], MD;  [facility];  Service: Endoscopy;  Laterality: N/A;  155 - office spoke with facility and they know to have pt here at 11:00  . FOOT SURGERY Right    x2-heel spur   . GIVENS CAPSULE STUDY N/A 11/09/2016   Procedure: GIVENS CAPSULE STUDY;  Surgeon: [name], MD;  [facility];  Service: Endoscopy;  Laterality: N/A;  7:30  . GIVENS CAPSULE STUDY N/A 11/10/2018   Procedure: GIVENS CAPSULE STUDY;  Surgeon: [name], MD;  [facility];   Service: Endoscopy;  Laterality: N/A;  . REVISON OF ARTERIOVENOUS FISTULA Right 01/05/2016   Procedure: REVISON OF ARTERIOVENOUS FISTULA- right forearm;  Surgeon: [name], MD;  Location: [place];  Service: Vascular;  Laterality: Right;  . SKIN CANCER EXCISION     forehead  . TONSILLECTOMY      Family Hx:  Family History  Problem Relation Age of Onset  . Multiple myeloma Mother   . Diabetes Mother   . Cirrhosis Mother   . Heart attack Father 16  . Colon cancer Neg Hx     Social History:  reports that she has been smoking cigarettes. She has a 10.00 pack-year smoking history. She has never used smokeless tobacco. She reports that she does not drink alcohol or use drugs.  Allergies:  Allergies  Allergen Reactions  . Penicillins Rash    Did it involve swelling of the face/tongue/throat, SOB, or low BP? Unknown Did it involve sudden or severe rash/hives, skin peeling, or any reaction on the inside of your mouth or nose? Unknown Did you need to seek medical attention at a hospital or doctor's office? Unknown When did it last happen?unknown If all above answers are "NO", may proceed with cephalosporin use.      Medications: Prior to Admission medications   Medication Sig Start Date End Date Taking? Authorizing Provider  acetaminophen (TYLENOL) 500 MG tablet Take 1,000 mg by mouth every 6 (six) hours as needed for mild pain (FOR HEADACHES/MINOR DISCOMFORT).    Yes [provider]  ALPRAZolam (XANAX) 0.25 MG tablet Take 0.25 mg by mouth 2 (two) times daily.    Yes [provider]  aluminum-magnesium hydroxide-simethicone (MAALOX) 200-200-20 MG/5ML SUSP Take 30 mLs by mouth 4 (four) times daily as needed (heartburn/indigestion).   Yes [provider]  amLODipine (NORVASC) 5 MG tablet Take 1 tablet (5 mg total) by mouth at bedtime. 02/28/19  Yes Emokpae, Courage, MD  b complex vitamins capsule Take 1 capsule by mouth daily.    Yes [provider]   buPROPion (WELLBUTRIN) 75 MG tablet Take 75 mg by mouth daily.    Yes [provider]  carvedilol (COREG) 6.25 MG tablet Take 1 tablet (6.25 mg total) by mouth 2 (two) times a day. 02/28/19  Yes Emokpae, Courage, MD  cholecalciferol (VITAMIN D3) 25 MCG (1000 UT) tablet Take 1,000 Units by mouth daily.   Yes [provider]  fexofenadine (ALLEGRA) 180 MG tablet Take 180 mg by mouth daily.    Yes [provider]  furosemide (LASIX) 20 MG tablet Take 20 mg by mouth daily as needed for edema.   Yes [provider]  furosemide (LASIX) 40 MG tablet Take 40 mg by mouth daily.   Yes [provider]  gabapentin (NEURONTIN) 300 MG capsule Take 300-600 mg by mouth See admin instructions. Patient takes 368m at 0800 and at 1400; patient then takes 6091m  at 2000   Yes [provider]  glipiZIDE (GLUCOTROL) 5 MG tablet Take 1 tablet (5 mg total) by mouth 2 (two) times a day. 02/28/19  Yes Emokpae, Courage, MD  guaiFENesin-codeine (ROBITUSSIN AC) 100-10 MG/5ML syrup Take 5 mLs by mouth 4 (four) times daily as needed for cough.   Yes [provider]  hydroxypropyl methylcellulose / hypromellose (ISOPTO TEARS / GONIOVISC) 2.5 % ophthalmic solution Place 1 drop into both eyes every 4 (four) hours as needed for dry eyes.    Yes [provider]  insulin lispro (HUMALOG) 100 UNIT/ML KiwkPen Inject 10 Units into the skin 3 (three) times daily before meals. *Do Not to inject for levels below 100   Yes [provider]  loperamide (IMODIUM) 2 MG capsule Take 1 capsule (2 mg total) by mouth 3 (three) times daily before meals. Patient taking differently: Take 2 mg by mouth daily.  06/05/19  Yes Rehman, Mechele Dawley, MD  Melatonin 5 MG CAPS Take 10 mg by mouth at bedtime.    Yes [provider]  pantoprazole (PROTONIX) 40 MG tablet Take 1 tablet (40 mg total) by mouth 2 (two) times daily. 11/10/18  Yes Barton Dubois, MD  sertraline (ZOLOFT) 50 MG  tablet Take 75 mg by mouth daily.    Yes [provider]  sevelamer carbonate (RENVELA) 800 MG tablet Take 1,600-2,400 mg by mouth See admin instructions. 2400 mg 3 times daily with meals and 1600 mg twice daily with snacks   Yes [provider]  traZODone (DESYREL) 50 MG tablet Take 25 mg by mouth at bedtime as needed for sleep.   Yes [provider]  TRESIBA FLEXTOUCH 100 UNIT/ML SOPN FlexTouch Pen Inject 50 Units into the skin every morning.  06/26/19  Yes [provider]    I have reviewed the patient's current medications.  Labs:  Results for orders placed or performed during the hospital encounter of 08/06/19 (from the past 48 hour(s))  Basic metabolic panel     Status: Abnormal   Collection Time: 08/06/19 11:21 AM  Result Value Ref Range   Sodium 136 135 - 145 mmol/L   Potassium 5.4 (H) 3.5 - 5.1 mmol/L   Chloride 97 (L) 98 - 111 mmol/L   CO2 25 22 - 32 mmol/L   Glucose, Bld 159 (H) 70 - 99 mg/dL   BUN 66 (H) 8 - 23 mg/dL   Creatinine, Ser 9.09 (H) 0.44 - 1.00 mg/dL   Calcium 8.9 8.9 - 10.3 mg/dL   GFR calc non Af Amer 4 (L) >60 mL/min   GFR calc Af Amer 5 (L) >60 mL/min   Anion gap 14 5 - 15    Comment: Performed at [facility], [address], [place], [place] [address]  CBC     Status: Abnormal   Collection Time: 08/06/19 11:21 AM  Result Value Ref Range   WBC 3.9 (L) 4.0 - 10.5 K/uL   RBC 2.13 (L) 3.87 - 5.11 MIL/uL   Hemoglobin 6.7 (LL) 12.0 - 15.0 g/dL    Comment: REPEATED TO VERIFY THIS CRITICAL RESULT HAS VERIFIED AND BEEN CALLED TO BRANDY OAKLEY BY HILLARY FLYNT ON 11 30 2020 AT 1146, AND HAS BEEN READ BACK.     HCT 21.9 (L) 36.0 - 46.0 %   MCV 102.8 (H) 80.0 - 100.0 fL   MCH 31.5 26.0 - 34.0 pg   MCHC 30.6 30.0 - 36.0 g/dL   RDW 20.0 (H) 11.5 - 15.5 %  Platelets 131 (L) 150 - 400 K/uL   nRBC 0.0 0.0 - 0.2 %    Comment: Performed at [facility], [address] [place], [place] [address]  Troponin I (High Sensitivity)      Status: None   Collection Time: 08/06/19 11:21 AM  Result Value Ref Range   Troponin I (High Sensitivity) 17 <18 ng/L    Comment: (NOTE) Elevated high sensitivity troponin I (hsTnI) values and significant  changes across serial measurements may suggest ACS but many other  chronic and acute conditions are known to elevate hsTnI results.  Refer to the "Links" section for chest pain algorithms and additional  guidance. Performed at [facility], [address] [place], [place] [address]   Hemoglobin A1c     Status: None   Collection Time: 08/06/19 11:21 AM  Result Value Ref Range   Hgb A1c MFr Bld 5.0 4.8 - 5.6 %    Comment: (NOTE) Pre diabetes:          5.7%-6.4% Diabetes:              >6.4% Glycemic control for   <7.0% adults with diabetes    Mean Plasma Glucose 96.8 mg/dL    Comment: Performed at [place] [address], [place], [place] [address]  SARS CORONAVIRUS 2 (TAT 6-24 HRS) Nasopharyngeal Nasopharyngeal Swab     Status: None   Collection Time: 08/06/19 11:59 AM   Specimen: Nasopharyngeal Swab  Result Value Ref Range   SARS Coronavirus 2 NEGATIVE NEGATIVE    Comment: (NOTE) SARS-CoV-2 target nucleic acids are NOT DETECTED. The SARS-CoV-2 RNA is generally detectable in upper and lower respiratory specimens during the acute phase of infection. Negative results do not preclude SARS-CoV-2 infection, do not rule out co-infections with other pathogens, and should not be used as the sole basis for treatment or other patient management decisions. Negative results must be combined with clinical observations, patient history, and epidemiological information. The expected result is Negative. Fact Sheet for Patients: SugarRoll.be Fact Sheet for Healthcare Providers: https://www.woods-mathews.com/ This test is not yet approved or cleared by the [place] FDA and  has been authorized for detection and/or diagnosis of  SARS-CoV-2 by FDA under an Emergency Use Authorization (EUA). This EUA will remain  in effect (meaning this test can be used) for the duration of the COVID-19 declaration under Section 56 4(b)(1) of the Act, 21 U.S.C. section 360bbb-3(b)(1), unless the authorization is terminated or revoked sooner. Performed at [facility], [place] [address] [place], [place] [address]   Type and screen [facility]     Status: None (Preliminary result)   Collection Time: 08/06/19 12:53 PM  Result Value Ref Range   ABO/RH(D) O POS    Antibody Screen POS    Sample Expiration 08/09/2019,2359    Antibody Identification NO CLINICALLY SIGNIFICANT ANTIBODY IDENTIFIED    Unit Number [id number]    Blood Component Type RED CELLS,LR    Unit division 00    Status of Unit ISSUED    Donor AG Type NEGATIVE FOR E ANTIGEN    Transfusion Status OK TO TRANSFUSE    Crossmatch Result COMPATIBLE    Unit Number      [id number] Performed at [facility], [place] [address] [place], [place] [address]    Blood Component Type      RED CELLS,LR Performed at [place] [address], [place], [place] [address]    Unit division  00 Performed at [facility], [place] [address], [place], [place] [address]    Status of Unit      ISSUED Performed at [facility], [address] [place], [place] [address]    Donor AG Type NEGATIVE FOR E ANTIGEN    Transfusion Status OK TO TRANSFUSE    Crossmatch Result COMPATIBLE   Prepare RBC     Status: None   Collection Time: 08/06/19 12:53 PM  Result Value Ref Range   Order Confirmation      ORDER PROCESSED BY BLOOD BANK Performed at [facility], [address], [place], [place] [address]   Troponin I (High Sensitivity)     Status: Abnormal   Collection Time: 08/06/19 12:53 PM  Result Value Ref Range   Troponin I (High Sensitivity) 18 (H) <18 ng/L    Comment: (NOTE) Elevated high sensitivity troponin  I (hsTnI) values and significant  changes across serial measurements may suggest ACS but many other  chronic and acute conditions are known to elevate hsTnI results.  Refer to the "Links" section for chest pain algorithms and additional  guidance. Performed at [facility], [address], [place], [place] [address]   CBG monitoring, ED     Status: Abnormal   Collection Time: 08/06/19  4:56 PM  Result Value Ref Range   Glucose-Capillary 50 (L) 70 - 99 mg/dL   Comment 1 Notify RN   Glucose, capillary     Status: Abnormal   Collection Time: 08/06/19 10:25 PM  Result Value Ref Range   Glucose-Capillary 155 (H) 70 - 99 mg/dL  Glucose, capillary     Status: Abnormal   Collection Time: 08/07/19  8:03 AM  Result Value Ref Range   Glucose-Capillary 69 (L) 70 - 99 mg/dL   Comment 1 Notify RN      ROS:  Pertinent items noted in HPI and remainder of comprehensive ROS otherwise negative.  Physical Exam: Vitals:   08/07/19 0715 08/07/19 0800  BP: (!) 158/77 (!) 151/58  Pulse: 66 64  Resp: 17 18  Temp: (!) 97.4 F (36.3 C)   SpO2: 99%      General exam: Appears calm and comfortable  Respiratory system: Clear to auscultation. Respiratory effort normal. No wheezing or crackle Cardiovascular system: S1 & S2 heard, RRR.  B/l LE edema + Gastrointestinal system: Abdomen is nondistended, soft and nontender. Normal bowel sounds heard. Central nervous system: Alert and oriented. No focal neurological deficits. Extremities: Symmetric 5 x 5 power. Skin: No rashes, lesions or ulcers Psychiatry: Judgement and insight appear normal. Mood & affect appropriate.  Vascular access: RUE AV fistula has good thrill and bruit.  Assessment/Plan:  #ESRD on HD MWF: Missed HD yesterday.  Potassium 5.4.  She has fluid overload on exam.  Plan for HD today off schedule for 4 hours, 2K bath and goal UF 3 to 4 kg as tolerated.  aVF for the access.  Likely another treatment tomorrow to resume her  schedule.  #Anemia due to GI bleed and CKD: Receiving 2 units of blood transfusion.  GI was consulted and plan for possible endoscopy tomorrow.  Continue to monitor.  #Hypertension/volume: Blood pressure mildly elevated associated with fluid overload.  UF during HD today.  Resume home medication.  #Secondary hyperparathyroidism: Check phosphorus level.  Continue binders.  Discussed with the primary team and GI.  Dron Tanna Furry 08/07/2019, 8:51 AM  [employer].

## 2019-08-07 NOTE — Progress Notes (Signed)
IV restart successful by ED nurse. Blood transfusion restarted without difficulty.

## 2019-08-07 NOTE — Progress Notes (Signed)
Received call from lab stating that patient's third troponin was not completed last pm. MD Wynetta Emery notified, no new orders.

## 2019-08-07 NOTE — Progress Notes (Signed)
Blood transfusion completed. No s/s tranfusion reaction noted.

## 2019-08-07 NOTE — TOC Initial Note (Signed)
Transition of Care [facility]) - Initial/Assessment Note    Patient Details  Name: Tanya Lucero MRN: [id number] Date of Birth: May 09, 1951  Transition of Care [facility]) CM/SW Contact:    Ihor Gully, LCSW Phone Number: 08/07/2019, 5:30 PM  Clinical Narrative:                 Patient from [facility]. Admitted for systematic anemia. On HD, MWF schedule. High risk due to number of active Rx. TOC will continue to follow and facilitate discharge and return to ALF.   Expected Discharge Plan: Assisted Living Barriers to Discharge: Continued Medical Work up   Patient Goals and CMS Choice        Expected Discharge Plan and Services Expected Discharge Plan: Assisted Living       Living arrangements for the past 2 months: Assisted Living Facility                                      Prior Living Arrangements/Services Living arrangements for the past 2 months: [place] Lives with:: Facility Resident Patient language and need for interpreter reviewed:: Yes        Need for Family Participation in Patient Care: Yes (Comment) Care giver support system in place?: Yes (comment) Current home services: DME Criminal Activity/Legal Involvement Pertinent to Current Situation/Hospitalization: No - Comment as needed  Activities of Daily Living Home Assistive Devices/Equipment: Walker (specify type) ADL Screening (condition at time of admission) Patient's cognitive ability adequate to safely complete daily activities?: Yes Is the patient deaf or have difficulty hearing?: No Does the patient have difficulty seeing, even when wearing glasses/contacts?: No Does the patient have difficulty concentrating, remembering, or making decisions?: No Patient able to express need for assistance with ADLs?: Yes Does the patient have difficulty dressing or bathing?: No Independently performs ADLs?: Yes (appropriate for developmental age) Does the patient have difficulty walking  or climbing stairs?: Yes Weakness of Legs: Both Weakness of Arms/Hands: None  Permission Sought/Granted                  Emotional Assessment Appearance:: Appears stated age   Affect (typically observed): Appropriate Orientation: : Oriented to Self, Oriented to Place, Oriented to  Time, Oriented to Situation Alcohol / Substance Use: Not Applicable Psych Involvement: No (comment)  Admission diagnosis:  Precordial pain [R07.2] ESRD on dialysis ([place]) [N18.6, Z99.2] Symptomatic anemia [D64.9] Patient Active Problem List   Diagnosis Date Noted  . Hyperkalemia 08/07/2019  . GERD (gastroesophageal reflux disease) 06/05/2019  . Anemia due to chronic blood loss 06/05/2019  . 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 Heme positive stool 11/20/2018  . Symptomatic anemia 11/09/2018  . ESRD on dialysis ([place]) 11/09/2018  . Anxiety and depression 11/09/2018  . Secondary hyperparathyroidism of renal origin ([place]) 11/09/2018  . Diabetic neuropathy ([place]) 11/09/2018  . Guaiac positive stools 07/19/2017  . GI bleed 07/05/2017  . Iron deficiency anemia due to chronic blood loss 10/27/2016  . RENAL INSUFFICIENCY 03/04/2010  . Type 2 diabetes with nephropathy ([place]) 02/17/2010  . Essential hypertension 02/17/2010  . OTHER MALAISE AND FATIGUE 02/17/2010  . Shortness of breath 02/17/2010  . CHEST PAIN, PRECORDIAL 02/17/2010   PCP:  Scotty Court, DO Pharmacy:   [place], [address] [place] [address] Phone: [phone] Fax: [phone]     Social  Determinants of Health (SDOH) Interventions    Readmission Risk Interventions No flowsheet data found.

## 2019-08-07 NOTE — Plan of Care (Signed)

## 2019-08-07 NOTE — Progress Notes (Signed)
Blood sugar 65 mg/dl.  30 gm oral carbohydrates given (juice) per verbal order of Dr. Wynetta Emery. Pt is still listed as NPO waiting for GI consult. Dialysis nurse in room at present waiting to begin dialysis session. Pt alert and oriented.

## 2019-08-07 NOTE — Progress Notes (Signed)
At 0803,  Pt's blood glucose 69 mg/dl. MD notified, attempted to administer 12.5mg  D50 IV but IV site noted to be infiltrated with blood infusing. Blood administration stopped, IV site removed. Attempted to restart IV without success. MD to room to assess pt, notified of loss of IV site and inability to admin D50.  Verbal order received from Dr. Wynetta Emery  to admin oral intake to treat low bloodsugar and to give oral am meds. 0845: ED nurse in to use ultrasound to attempt another peripheral IV site. Blood to be restarted as soon as IV site established. Nephrologist in to evaluate patient at this time.

## 2019-08-08 ENCOUNTER — Encounter (HOSPITAL_COMMUNITY)
Admission: EM | Disposition: A | Discharge status: Home / Self Care | Payer: Self-pay | Source: Home / Self Care | Attending: Family Medicine

## 2019-08-08 ENCOUNTER — Encounter (HOSPITAL_COMMUNITY): Payer: Self-pay | Admitting: *Deleted

## 2019-08-08 ENCOUNTER — Inpatient Hospital Stay (HOSPITAL_COMMUNITY): Payer: Medicare Other

## 2019-08-08 DIAGNOSIS — R0602 Shortness of breath: Secondary | ICD-10-CM

## 2019-08-08 DIAGNOSIS — D500 Iron deficiency anemia secondary to blood loss (chronic): Principal | ICD-10-CM

## 2019-08-08 DIAGNOSIS — K31819 Angiodysplasia of stomach and duodenum without bleeding: Secondary | ICD-10-CM

## 2019-08-08 DIAGNOSIS — K3189 Other diseases of stomach and duodenum: Secondary | ICD-10-CM

## 2019-08-08 DIAGNOSIS — G44221 Chronic tension-type headache, intractable: Secondary | ICD-10-CM

## 2019-08-08 DIAGNOSIS — K317 Polyp of stomach and duodenum: Secondary | ICD-10-CM

## 2019-08-08 DIAGNOSIS — K2100 Gastro-esophageal reflux disease with esophagitis, without bleeding: Secondary | ICD-10-CM

## 2019-08-08 DIAGNOSIS — R195 Other fecal abnormalities: Secondary | ICD-10-CM

## 2019-08-08 DIAGNOSIS — R519 Headache, unspecified: Secondary | ICD-10-CM | POA: Diagnosis present

## 2019-08-08 HISTORY — PX: ESOPHAGOGASTRODUODENOSCOPY: SHX5428

## 2019-08-08 LAB — RENAL FUNCTION PANEL
Albumin: 3.5 g/dL (ref 3.5–5.0)
Anion gap: 12 (ref 5–15)
BUN: 33 mg/dL — ABNORMAL HIGH (ref 8–23)
CO2: 28 mmol/L (ref 22–32)
Calcium: 7.9 mg/dL — ABNORMAL LOW (ref 8.9–10.3)
Chloride: 93 mmol/L — ABNORMAL LOW (ref 98–111)
Creatinine, Ser: 5.89 mg/dL — ABNORMAL HIGH (ref 0.44–1.00)
GFR calc Af Amer: 8 mL/min — ABNORMAL LOW (ref >60)
GFR calc non Af Amer: 7 mL/min — ABNORMAL LOW (ref >60)
Glucose, Bld: 91 mg/dL (ref 70–99)
Phosphorus: 4.5 mg/dL (ref 2.5–4.6)
Potassium: 4.3 mmol/L (ref 3.5–5.1)
Sodium: 133 mmol/L — ABNORMAL LOW (ref 135–145)

## 2019-08-08 LAB — CBC
HCT: 27 % — ABNORMAL LOW (ref 36.0–46.0)
Hemoglobin: 8.4 g/dL — ABNORMAL LOW (ref 12.0–15.0)
MCH: 29.9 pg (ref 26.0–34.0)
MCHC: 31.1 g/dL (ref 30.0–36.0)
MCV: 96.1 fL (ref 80.0–100.0)
Platelets: 140 10*3/uL — ABNORMAL LOW (ref 150–400)
RBC: 2.81 MIL/uL — ABNORMAL LOW (ref 3.87–5.11)
RDW: 19.9 % — ABNORMAL HIGH (ref 11.5–15.5)
WBC: 4.2 10*3/uL (ref 4.0–10.5)
nRBC: 0 % (ref 0.0–0.2)

## 2019-08-08 LAB — GLUCOSE, CAPILLARY
Glucose-Capillary: 94 mg/dL (ref 70–99)
Glucose-Capillary: 88 mg/dL (ref 70–99)
Glucose-Capillary: 122 mg/dL — ABNORMAL HIGH (ref 70–99)
Glucose-Capillary: 76 mg/dL (ref 70–99)
Glucose-Capillary: 104 mg/dL — ABNORMAL HIGH (ref 70–99)
Glucose-Capillary: 132 mg/dL — ABNORMAL HIGH (ref 70–99)

## 2019-08-08 LAB — PHOSPHORUS: Phosphorus: 4.5 mg/dL (ref 2.5–4.6)

## 2019-08-08 SURGERY — EGD (ESOPHAGOGASTRODUODENOSCOPY)
Anesthesia: Moderate Sedation

## 2019-08-08 MED ORDER — DEXTROSE-NACL 5-0.9 % IV SOLN
INTRAVENOUS | Status: DC
  Administered 2019-08-08: 1000 mL via INTRAVENOUS

## 2019-08-08 MED ORDER — STERILE WATER FOR IRRIGATION IR SOLN
Status: DC | PRN
  Administered 2019-08-08: 1.5 mL

## 2019-08-08 MED ORDER — MIDAZOLAM HCL 5 MG/5ML IJ SOLN
INTRAMUSCULAR | Status: AC
  Filled 2019-08-08: qty 10

## 2019-08-08 MED ORDER — CHLORHEXIDINE GLUCONATE CLOTH 2 % EX PADS
6.0000 | MEDICATED_PAD | Freq: Every day | CUTANEOUS | Status: DC
  Administered 2019-08-08 – 2019-08-09 (×2): 6 via TOPICAL

## 2019-08-08 MED ORDER — MIDAZOLAM HCL 5 MG/5ML IJ SOLN
INTRAMUSCULAR | Status: DC | PRN
  Administered 2019-08-08 (×2): 2 mg via INTRAVENOUS
  Administered 2019-08-08: 1 mg via INTRAVENOUS

## 2019-08-08 MED ORDER — MEPERIDINE HCL 50 MG/ML IJ SOLN
INTRAMUSCULAR | Status: AC
  Filled 2019-08-08: qty 1

## 2019-08-08 MED ORDER — SODIUM CHLORIDE 0.9 % IV SOLN: INTRAVENOUS | Status: DC

## 2019-08-08 MED ORDER — FENTANYL CITRATE (PF) 100 MCG/2ML IJ SOLN
INTRAMUSCULAR | Status: DC | PRN
  Administered 2019-08-08 (×2): 25 ug via INTRAVENOUS

## 2019-08-08 MED ORDER — FENTANYL CITRATE (PF) 100 MCG/2ML IJ SOLN
INTRAMUSCULAR | Status: AC
  Filled 2019-08-08: qty 2

## 2019-08-08 MED ORDER — LIDOCAINE VISCOUS HCL 2 % MT SOLN
OROMUCOSAL | Status: DC | PRN
  Administered 2019-08-08: 4 mL via OROMUCOSAL

## 2019-08-08 MED ORDER — LIDOCAINE VISCOUS HCL 2 % MT SOLN
OROMUCOSAL | Status: AC
  Filled 2019-08-08: qty 15

## 2019-08-08 NOTE — Op Note (Signed)
[facility] Patient Name: Tanya Lucero Procedure Date: 08/08/2019 2:51 PM MRN: [id number] Date of Birth: [birth year] Attending MD: Hildred Laser , MD CSN: [id number] Age: 68 Admit Type: Inpatient Procedure:                Upper GI endoscopy Indications:              Iron deficiency anemia secondary to chronic blood                            loss, Follow-up of angioectasia of the stomach, For                            therapy of angioectasia of the stomach Providers:                Hildred Laser, MD, Otis Peak B. Sharon Seller, RN, Raphael Gibney, Technician Referring MD:             Murlean Iba, MD Medicines:                Lidocaine spray, Fentanyl 50 micrograms IV,                            Midazolam 5 mg IV Complications:            No immediate complications. Estimated Blood Loss:     Estimated blood loss was minimal. Procedure:                Pre-Anesthesia Assessment:                           - Prior to the procedure, a History and Physical                            was performed, and patient medications and                            allergies were reviewed. The patient's tolerance of                            previous anesthesia was also reviewed. The risks                            and benefits of the procedure and the sedation                            options and risks were discussed with the patient.                            All questions were answered, and informed consent                            was obtained. Prior Anticoagulants: The patient has  taken no previous anticoagulant or antiplatelet                            agents. ASA Grade Assessment: III - A patient with                            severe systemic disease. After reviewing the risks                            and benefits, the patient was deemed in                            satisfactory condition to undergo the procedure.               After obtaining informed consent, the endoscope was                            passed under direct vision. Throughout the                            procedure, the patient's blood pressure, pulse, and                            oxygen saturations were monitored continuously. The                            [id number]-H190 ([id number]) scope was introduced through the                            mouth, and advanced to the second part of duodenum.                            The upper GI endoscopy was accomplished without                            difficulty. The patient tolerated the procedure                            well. Scope In: 3:16:37 PM Scope Out: 3:30:01 PM Total Procedure Duration: 0 hours 13 minutes 24 seconds  Findings:      The examined esophagus was normal.      The Z-line was regular and was found 42 cm from the incisors.      Nodular mucosa was found in the gastric antrum and in the prepyloric       region of the stomach.      Mild gastric antral vascular ectasia without bleeding was present in the       gastric antrum and in the prepyloric region of the stomach. Coagulation       for bleeding prevention using argon plasma was successful.      A single small sessile polyp with no bleeding and no stigmata of recent       bleeding was found in the gastric fundus.      The exam of the stomach was otherwise normal.      The duodenal  bulb and second portion of the duodenum were normal. Impression:               - Normal esophagus.                           - Z-line regular, 42 cm from the incisors.                           - Gastric antral vascular ectasia without bleeding.                            Treated with argon plasma coagulation (APC).                           - A single gastric polyp.                           - Normal duodenal bulb and second portion of the                            duodenum.                           - No specimens collected. Moderate  Sedation:      Moderate (conscious) sedation was administered by the endoscopy nurse       and supervised by the endoscopist. The following parameters were       monitored: oxygen saturation, heart rate, blood pressure, CO2       capnography and response to care. Total physician intraservice time was       19 minutes. Recommendation:           - Return patient to hospital ward for ongoing care.                           - Diabetic (ADA) diet today.                           - Continue present medications.                           - No aspirin, ibuprofen, naproxen, or other                            non-steroidal anti-inflammatory drugs.                           - Repeat upper endoscopy PRN. Procedure Code(s):        --- Professional ---                           [phone], Esophagogastroduodenoscopy, flexible,                            transoral; with control of bleeding, any method                           G0500, Moderate sedation  services provided by the                            same physician or other qualified health care                            professional performing a gastrointestinal                            endoscopic service that sedation supports,                            requiring the presence of an independent trained                            observer to assist in the monitoring of the                            patient's level of consciousness and physiological                            status; initial 15 minutes of intra-service time;                            patient age 67 years or older (additional time may                            be reported with [phone], as appropriate) Diagnosis Code(s):        --- Professional ---                           K31.89, Other diseases of stomach and duodenum                           K31.819, Angiodysplasia of stomach and duodenum                            without bleeding                           K31.7, Polyp of stomach  and duodenum                           D50.0, Iron deficiency anemia secondary to blood                            loss (chronic) CPT copyright 2019 American Medical Association. All rights reserved. The codes documented in this report are preliminary and upon coder review may  be revised to meet current compliance requirements. Hildred Laser, MD Hildred Laser, MD 08/08/2019 3:46:11 PM This report has been signed electronically. Number of Addenda: 0

## 2019-08-08 NOTE — Evaluation (Signed)
Physical Therapy Evaluation Patient Details Name: Tanya Lucero MRN: [id number] DOB: [birth year] Today's Date: 08/08/2019   History of Present Illness  Tanya Lucero is a 68 y.o. female with medical history significant for ESRD, DM2, HTN, GAVE, Anxiety and depression. Presented to the Ed with c/o central to slightly left-sided chest pain that started this morning when she was moving around trying to get ready to go for her dialysis.  Describes pain as sharp and burning.  She reports associated difficulty catching her breath.  No nausea no vomiting.  Symptoms resolved when she rested.  She reports prior episodes similar symptoms in the past with exertion.She reports black stools over the past several weeks.  No vomiting, no abdominal pain.  She denies NSAID use.  She is on Lasix and reports she still makes urine.  HD schedule Monday Wednesday Friday, her last dialysis was on Friday.  She did not get dialyzed today.  Clinical Impression  Physical therapy evaluation completed and therapeutic exercises issued, patient is at baseline and no further PT services recommended at this time. Pt on 2LPM O2, not originally on supplemental O2, nursing notified of O2 sat 100% with all mobility on 2LPM O2. Patient discharged to care of nursing for ambulation daily as tolerated for length of stay.     Follow Up Recommendations No PT follow up    Equipment Recommendations  None recommended by PT    Recommendations for Other Services       Precautions / Restrictions Precautions Precautions: None Restrictions Weight Bearing Restrictions: No      Mobility  Bed Mobility Overal bed mobility: Modified Independent             General bed mobility comments: increased time, use of bedrail for supine to sit transfer  Transfers Overall transfer level: Modified independent Equipment used: None;Rolling walker (2 wheeled)             General transfer comment: STS transfers from EOB, steady upon  standing and proper hand placement for safety with AD  Ambulation/Gait Ambulation/Gait assistance: Modified independent (Device/Increase time) Gait Distance (Feet): 40 Feet Assistive device: Rolling walker (2 wheeled) Gait Pattern/deviations: WFL(Within Functional Limits) Gait velocity: slightly decreased   General Gait Details: Pt ambulates around room, able to perform 180 degree turns with correct AD management, on 2LPM O2 with O2 sat 100% with mobility  Stairs            Wheelchair Mobility    Modified Rankin (Stroke Patients Only)       Balance Overall balance assessment: No apparent balance deficits (not formally assessed)                                           Pertinent Vitals/Pain Pain Assessment: 0-10 Pain Score: 1  Pain Location: headache Pain Descriptors / Indicators: Headache Pain Intervention(s): Limited activity within patient's tolerance;Monitored during session    Home Living Family/patient expects to be discharged to:: Assisted living               Home Equipment: Walker - 4 wheels(rollator walker)      Prior Function Level of Independence: Independent with assistive device(s);Needs assistance   Gait / Transfers Assistance Needed: pt reports household ambulator with Rollator walker, occasionally "furniture" walks around room or bathroom  ADL's / Homemaking Assistance Needed: pt reports requiring ALF staff assistance with bathing and dressing  Hand Dominance        Extremity/Trunk Assessment   Upper Extremity Assessment Upper Extremity Assessment: Overall WFL for tasks assessed    Lower Extremity Assessment Lower Extremity Assessment: Overall WFL for tasks assessed    Cervical / Trunk Assessment Cervical / Trunk Assessment: Normal  Communication   Communication: No difficulties  Cognition Arousal/Alertness: Awake/alert Behavior During Therapy: WFL for tasks assessed/performed Overall Cognitive  Status: Within Functional Limits for tasks assessed                                        General Comments      Exercises General Exercises - Lower Extremity Long Arc Quad: Seated;Strengthening;Both;10 reps Hip Flexion/Marching: Seated;Strengthening;Both;10 reps(also performed in standing with RW for balance support) Toe Raises: Seated;Strengthening;Both;10 reps Heel Raises: Seated;Strengthening;Both;10 reps   Assessment/Plan    PT Assessment Patent does not need any further PT services  PT Problem List         PT Treatment Interventions      PT Goals (Current goals can be found in the Care Plan section)  Acute Rehab PT Goals Patient Stated Goal: return to [facility] with AFL staff to assist PT Goal Formulation: With patient Time For Goal Achievement: 08/08/19 Potential to Achieve Goals: Good    Frequency     Barriers to discharge        Co-evaluation               AM-PAC PT "6 Clicks" Mobility  Outcome Measure Help needed turning from your back to your side while in a flat bed without using bedrails?: None Help needed moving from lying on your back to sitting on the side of a flat bed without using bedrails?: None Help needed moving to and from a bed to a chair (including a wheelchair)?: None Help needed standing up from a chair using your arms (e.g., wheelchair or bedside chair)?: None Help needed to walk in hospital room?: None Help needed climbing 3-5 steps with a railing? : A Little 6 Click Score: 23    End of Session Equipment Utilized During Treatment: Oxygen(2LPM O2) Activity Tolerance: Patient tolerated treatment well Patient left: in bed;with call bell/phone within reach Nurse Communication: Mobility status PT Visit Diagnosis: Other abnormalities of gait and mobility (R26.89)    Time: 1030-1053 PT Time Calculation (min) (ACUTE ONLY): 23 min   Charges:   PT Evaluation $PT Eval Moderate Complexity: 1 Mod PT  Treatments $Therapeutic Exercise: 8-22 mins        Tori Kaimana Neuzil PT, DPT 08/08/19, 1:27 PM [phone]

## 2019-08-08 NOTE — Progress Notes (Signed)
Brief EGD note.  Because of the esophagus and GE junction. Nodular antral mucosa with scattered telangiectasia but without stigmata of bleed. Majority of these lesions were ablated with APC. Small polyp at gastric fundus unchanged since last EGD.  It was left alone.

## 2019-08-08 NOTE — Progress Notes (Signed)
Patient says she vomited after she ate clear liquid breakfast.  No hematemesis melena reported. He denies chest pain shortness of breath or abdominal pain. Hemoglobin is morning 8.4 g. Patient is agreeable to proceed with esophagogastroduodenoscopy.

## 2019-08-08 NOTE — Progress Notes (Addendum)
[facility] NEPHROLOGY PROGRESS NOTE  Assessment/ Plan: Pt is a 68 y.o. yo female  with history of hypertension, DM, anxiety, anemia due to recurrent GI bleed from gastric antral vascular ectasia, ESRD on HD MWF at [facility] presented from SNF with a complaint of chest pain found to have hemoglobin of 6.7.  #ESRD on HD MWF: Status post HD yesterday off schedule with UF of 3.5 L. Patient is going for EGD today.  Potassium level acceptable. There is a short dialysis supplies today per HD nurse therefore we will plan for next treatment tomorrow. No urgent need for HD today anyways. AVF for the access.    #Anemia due to GI bleed and CKD: Received 2 units of blood transfusion.  GI was consulted and plan for possible endoscopy today.  Continue to monitor.  #Hypertension/volume: Blood pressure acceptable. Continue home medication. UF during HD.  #Secondary hyperparathyroidism: Phosphorus level acceptable. Continue binders.  Subjective: Seen and examined. Plan for EGD today by GI. Denied chest pain, shortness of breath, nausea vomiting. No new event. Objective Vital signs in last 24 hours: Vitals:   08/07/19 1750 08/07/19 1942 08/08/19 0534 08/08/19 0751  BP: 132/69  (!) 117/53 132/66  Pulse: 77  63 64  Resp: 18  19 16   Temp: 98 F (36.7 C)  98 F (36.7 C)   TempSrc: Oral  Oral   SpO2:  98% 100% 100%  Weight:      Height:       Weight change: 0.5 kg  Intake/Output Summary (Last 24 hours) at 08/08/2019 0856 Last data filed at 08/07/2019 1730 Gross per 24 hour  Intake -  Output 3400 ml  Net -3400 ml       Labs: Basic Metabolic Panel: Recent Labs  Lab 08/06/19 1121 08/07/19 1347 08/08/19 0434  NA 136 135 133*  K 5.4* 5.9* 4.3  CL 97* 100 93*  CO2 25 23 28   GLUCOSE 159* 135* 91  BUN 66* 75* 33*  CREATININE 9.09* 9.64* 5.89*  CALCIUM 8.9 8.2* 7.9*  PHOS  --   --  4.5  4.5   Liver Function Tests: Recent Labs  Lab 08/08/19 0434  ALBUMIN 3.5   No  results for input(s): LIPASE, AMYLASE in the last 168 hours. No results for input(s): AMMONIA in the last 168 hours. CBC: Recent Labs  Lab 08/06/19 1121 08/07/19 1347 08/08/19 0434  WBC 3.9* 3.4* 4.2  HGB 6.7* 8.2* 8.4*  HCT 21.9* 25.7* 27.0*  MCV 102.8* 95.9 96.1  PLT 131* 115* 140*   Cardiac Enzymes: No results for input(s): CKTOTAL, CKMB, CKMBINDEX, TROPONINI in the last 168 hours. CBG: Recent Labs  Lab 08/07/19 1213 08/07/19 1701 08/07/19 2335 08/08/19 0403 08/08/19 0736  GLUCAP 100* 86 155* 94 88    Iron Studies: No results for input(s): IRON, TIBC, TRANSFERRIN, FERRITIN in the last 72 hours. Studies/Results: Dg Chest Portable 1 View  Result Date: 08/06/2019 CLINICAL DATA:  Chest pain EXAM: PORTABLE CHEST 1 VIEW COMPARISON:  July 04, 2019 FINDINGS: Suboptimal evaluation due to body habitus and portable technique. Chronic interstitial prominence with possible superimposed interstitial edema. Probable atelectasis at the right lung base. No pleural effusion. Stable cardiomegaly. IMPRESSION: Chronic interstitial prominence with possible superimposed interstitial edema. Probable right basilar atelectasis. Electronically Signed   By: Macy Mis M.D.   On: 08/06/2019 11:13    Medications: Infusions: . sodium chloride    . sodium chloride    . sodium chloride  Scheduled Medications: . ALPRAZolam  0.25 mg Oral BID  . amLODipine  5 mg Oral QHS  . buPROPion  75 mg Oral Daily  . carvedilol  6.25 mg Oral BID WC  . Chlorhexidine Gluconate Cloth  6 each Topical Q0600  . furosemide  40 mg Oral Daily  . insulin aspart  0-9 Units Subcutaneous TID WC  . insulin glargine  10 Units Subcutaneous Daily  . pantoprazole  40 mg Oral BID  . sertraline  75 mg Oral Daily  . sevelamer carbonate  2,400 mg Oral TID WC   And  . sevelamer carbonate  1,600 mg Oral BID    have reviewed scheduled and prn medications.  Physical Exam: General:NAD, comfortable Heart:RRR, s1s2  nl Lungs:clear b/l, no crackle Abdomen:soft, Non-tender, non-distended Extremities:LE trace edema Dialysis Access:  RUE AV fistula has good thrill and bruit Neurology: Alert, awake and following commands.  Zaley Talley Prasad Sadonna Kotara 08/08/2019,8:56 AM  LOS: 1 day  Pager: [phone]

## 2019-08-08 NOTE — Progress Notes (Signed)
Pt down to Endo for EGD. Pt has been NPO except for meds since 0830 (had clear liquid breakfast).

## 2019-08-08 NOTE — Progress Notes (Addendum)
PROGRESS NOTE Tanya Lucero  Tanya Lucero  FFM:[id number]  DOB: 1950-11-18  DOA: 08/06/2019 PCP: Tanya Court, DO   Brief Admission Hx: 68 year old female with ESRD, type 2 diabetes mellitus, hypertension, [address], anxiety depression presented complaining of shortness of breath and left-sided chest pain.  She had missed her last dialysis treatment.  She has had multiple admissions for symptomatic anemia and has received packed red blood cell transfusions.  MDM/Assessment & Plan:   1. Symptomatic anemia -patient now s/p 2 units of packed red blood cells and Hg improved to 8.4.  She remains on Protonix 40 mg twice daily.  GI consult was requested.  Last EGD was 01/11/2019. 2. Guaiac positive stool - I discussed with Dr. Laural Golden and he is planning EGD later today with therapeutic intent.   3. Chest pain-likely secondary to volume overload and shortness of breath.  Her troponins have been insignificant. 4. ESRD-patient missed Friday HD session.  Nephrology has been consulted and they have made arrangements for hemodialysis tomorrow which is her regularly scheduled HD day.  5. Chronic headache - Pt reporting persistent chronic occipital headache for last 3 months, Given the persistence will obtain CT head for further work up.  Analgesics ordered as needed.   6. Hyperkalemia - resolve now, treated with hemodialysis.  7. Anxiety and depression-chronic meds have been resumed. 8. Type 2 diabetes mellitus with neuropathy-she was restarted on glipizide and was having some hypoglycemia.  I have discontinued the glipizide.  I think that the glipizide should be discontinued permanently.  I also reduced her basal insulin by 50% to 10 units daily.  She is on SSI supplemental insulin coverage program.  We will continue to follow CBG. 9. Essential hypertension-much better controlled.  She has been restarted on her home Coreg and Norvasc.  DVT prophylaxis: SCDs Code Status: DNR Family Communication:  Patient updated at bedside no family was at the bedside today during rounds Disposition Plan: Inpatient   Consultants:  Nephrology  GI  Procedures:  Hemodialysis  Antimicrobials:     Subjective: Patient reports persistent occipital headache, nothing seems to help it.   Objective: Vitals:   08/07/19 1750 08/07/19 1942 08/08/19 0534 08/08/19 0751  BP: 132/69  (!) 117/53 132/66  Pulse: 77  63 64  Resp: 18  19 16   Temp: 98 F (36.7 C)  98 F (36.7 C)   TempSrc: Oral  Oral   SpO2:  98% 100% 100%  Weight:      Height:        Intake/Output Summary (Last 24 hours) at 08/08/2019 1302 Last data filed at 08/08/2019 0900 Gross per 24 hour  Intake 240 ml  Output 3400 ml  Net -3160 ml   Filed Weights   08/06/19 1032 08/07/19 1320  Weight: 98.9 kg 99.4 kg   REVIEW OF SYSTEMS  As per history otherwise all reviewed and reported negative  Exam:  General exam: Elderly female chronically ill-appearing she is awake and alert sitting up in bed.  No apparent distress at this time. Respiratory system: BBS CTA. No increased work of breathing. Cardiovascular system: S1 & S2 heard. No JVD, murmurs, gallops, clicks or pedal edema. Gastrointestinal system: Abdomen is nondistended, soft and nontender. Normal bowel sounds heard. Central nervous system: Alert and oriented. No focal neurological deficits. Extremities: 1+ edema bilateral lower extremities.  Data Reviewed: Basic Metabolic Panel: Recent Labs  Lab 08/06/19 1121 08/07/19 1347 08/08/19 0434  NA 136 135 133*  K 5.4* 5.9* 4.3  CL 97* 100 93*  CO2 25 23 28   GLUCOSE 159* 135* 91  BUN 66* 75* 33*  CREATININE 9.09* 9.64* 5.89*  CALCIUM 8.9 8.2* 7.9*  PHOS  --   --  4.5  4.5   Liver Function Tests: Recent Labs  Lab 08/08/19 0434  ALBUMIN 3.5   No results for input(s): LIPASE, AMYLASE in the last 168 hours. No results for input(s): AMMONIA in the last 168 hours. CBC: Recent Labs  Lab 08/06/19 1121 08/07/19  1347 08/08/19 0434  WBC 3.9* 3.4* 4.2  HGB 6.7* 8.2* 8.4*  HCT 21.9* 25.7* 27.0*  MCV 102.8* 95.9 96.1  PLT 131* 115* 140*   Cardiac Enzymes: No results for input(s): CKTOTAL, CKMB, CKMBINDEX, TROPONINI in the last 168 hours. CBG (last 3)  Recent Labs    08/08/19 0403 08/08/19 0736 08/08/19 1119  GLUCAP 94 88 122*   Recent Results (from the past 240 hour(s))  SARS CORONAVIRUS 2 (TAT 6-24 HRS) Nasopharyngeal Nasopharyngeal Swab     Status: None   Collection Time: 08/06/19 11:59 AM   Specimen: Nasopharyngeal Swab  Result Value Ref Range Status   SARS Coronavirus 2 NEGATIVE NEGATIVE Final    Comment: (NOTE) SARS-CoV-2 target nucleic acids are NOT DETECTED. The SARS-CoV-2 RNA is generally detectable in upper and lower respiratory specimens during the acute phase of infection. Negative results do not preclude SARS-CoV-2 infection, do not rule out co-infections with other pathogens, and should not be used as the sole basis for treatment or other patient management decisions. Negative results must be combined with clinical observations, patient history, and epidemiological information. The expected result is Negative. Fact Sheet for Patients: SugarRoll.be Fact Sheet for Healthcare Providers: https://www.woods-mathews.com/ This test is not yet approved or cleared by the [place] FDA and  has been authorized for detection and/or diagnosis of SARS-CoV-2 by FDA under an Emergency Use Authorization (EUA). This EUA will remain  in effect (meaning this test can be used) for the duration of the COVID-19 declaration under Section 56 4(b)(1) of the Act, 21 U.S.C. section 360bbb-3(b)(1), unless the authorization is terminated or revoked sooner. Performed at [facility], [place] [address] [place], [place] [address]      Studies: Ct Head Wo Contrast  Result Date: 08/08/2019 CLINICAL DATA:  Pt says she's been waking up with a  headache every morning for at least 1 month, no known triggers. EXAM: CT HEAD WITHOUT CONTRAST TECHNIQUE: Contiguous axial images were obtained from the base of the skull through the vertex without intravenous contrast. COMPARISON:  None. FINDINGS: Brain: No evidence of acute infarction, hemorrhage, hydrocephalus, extra-axial collection or mass lesion/mass effect. Are mild areas of predominantly periventricular white matter hypoattenuation consistent with chronic microvascular ischemic change. Vascular: No hyperdense vessel or unexpected calcification. Skull: Normal. Negative for fracture or focal lesion. Sinuses/Orbits: Globes and orbits are unremarkable. The sinuses and mastoid air cells are clear. Other: None. IMPRESSION: 1. No acute intracranial abnormalities. 2. Mild chronic microvascular ischemic change. Electronically Signed   By: Lajean Manes M.D.   On: 08/08/2019 10:32   Scheduled Meds: . ALPRAZolam  0.25 mg Oral BID  . amLODipine  5 mg Oral QHS  . buPROPion  75 mg Oral Daily  . carvedilol  6.25 mg Oral BID WC  . Chlorhexidine Gluconate Cloth  6 each Topical Q0600  . Chlorhexidine Gluconate Cloth  6 each Topical Q0600  . furosemide  40 mg Oral Daily  . insulin aspart  0-9 Units Subcutaneous TID WC  .  insulin glargine  10 Units Subcutaneous Daily  . pantoprazole  40 mg Oral BID  . sertraline  75 mg Oral Daily  . sevelamer carbonate  2,400 mg Oral TID WC   And  . sevelamer carbonate  1,600 mg Oral BID   Continuous Infusions: . sodium chloride    . sodium chloride    . sodium chloride      Active Problems:   Shortness of breath   Iron deficiency anemia due to chronic blood loss   Guaiac positive stools   Symptomatic anemia   ESRD on dialysis (HCC)   GAVE (gastric antral vascular ectasia)   GERD (gastroesophageal reflux disease)   Anemia due to chronic blood loss   Hyperkalemia   Chronic occipital headaches normal neuro exam   Time spent:   Irwin Brakeman, MD [facility] 08/08/2019, 1:02 PM    LOS: 1 day  How to contact the [facility] Attending or Consulting provider [place] or covering provider during after hours [place], for this patient?  1. Check the care team in [facility] and look for a) attending/consulting [facility] provider listed and b) the [facility] team listed 2. Log into [email or web address] and use [place]'s universal password to access. If you do not have the password, please contact the hospital operator. 3. Locate the [facility] provider you are looking for under [facility] and page to a number that you can be directly reached. 4. If you still have difficulty reaching the provider, please page the [facility] (Director on Call) for the Hospitalists listed on amion for assistance.

## 2019-08-08 NOTE — Progress Notes (Signed)
Pt back to room from Endo via stretcher. Pt able to ambulate with one assist back to bed. States, "Sleepy". Denies c/o pain or n/v. Slight expiratory wheezing noted, pt placed back on O2 @ 2lpm [place]. IV intact and patent. Pt advised can have po fluids at 1630 per MD. Stated understanding.

## 2019-08-09 ENCOUNTER — Encounter (HOSPITAL_COMMUNITY): Payer: Self-pay | Admitting: Internal Medicine

## 2019-08-09 LAB — CBC
HCT: 25.3 % — ABNORMAL LOW (ref 36.0–46.0)
Hemoglobin: 8 g/dL — ABNORMAL LOW (ref 12.0–15.0)
MCH: 30 pg (ref 26.0–34.0)
MCHC: 31.6 g/dL (ref 30.0–36.0)
MCV: 94.8 fL (ref 80.0–100.0)
Platelets: 121 K/uL — ABNORMAL LOW (ref 150–400)
RBC: 2.67 MIL/uL — ABNORMAL LOW (ref 3.87–5.11)
RDW: 18.7 % — ABNORMAL HIGH (ref 11.5–15.5)
WBC: 3.6 K/uL — ABNORMAL LOW (ref 4.0–10.5)
nRBC: 0 % (ref 0.0–0.2)

## 2019-08-09 LAB — RENAL FUNCTION PANEL
Albumin: 3.7 g/dL (ref 3.5–5.0)
Anion gap: 15 (ref 5–15)
BUN: 48 mg/dL — ABNORMAL HIGH (ref 8–23)
CO2: 24 mmol/L (ref 22–32)
Calcium: 8.1 mg/dL — ABNORMAL LOW (ref 8.9–10.3)
Chloride: 93 mmol/L — ABNORMAL LOW (ref 98–111)
Creatinine, Ser: 7.81 mg/dL — ABNORMAL HIGH (ref 0.44–1.00)
GFR calc Af Amer: 6 mL/min — ABNORMAL LOW (ref >60)
GFR calc non Af Amer: 5 mL/min — ABNORMAL LOW (ref >60)
Glucose, Bld: 106 mg/dL — ABNORMAL HIGH (ref 70–99)
Phosphorus: 6.3 mg/dL — ABNORMAL HIGH (ref 2.5–4.6)
Potassium: 4.5 mmol/L (ref 3.5–5.1)
Sodium: 132 mmol/L — ABNORMAL LOW (ref 135–145)

## 2019-08-09 LAB — GLUCOSE, CAPILLARY
Glucose-Capillary: 110 mg/dL — ABNORMAL HIGH (ref 70–99)
Glucose-Capillary: 114 mg/dL — ABNORMAL HIGH (ref 70–99)
Glucose-Capillary: 146 mg/dL — ABNORMAL HIGH (ref 70–99)

## 2019-08-09 LAB — SARS CORONAVIRUS 2 BY RT PCR (HOSPITAL ORDER, PERFORMED IN ~~LOC~~ HOSPITAL LAB): SARS Coronavirus 2: NEGATIVE

## 2019-08-09 LAB — HAPTOGLOBIN: Haptoglobin: 119 mg/dL (ref 37–355)

## 2019-08-09 LAB — PREPARE RBC (CROSSMATCH)

## 2019-08-09 MED ORDER — ALBUMIN HUMAN 25 % IV SOLN: 25.0000 g | Freq: Once | INTRAVENOUS | Status: DC

## 2019-08-09 MED ORDER — SODIUM CHLORIDE 0.9% IV SOLUTION
Freq: Once | INTRAVENOUS | Status: AC
  Administered 2019-08-09: 13:00:00 via INTRAVENOUS

## 2019-08-09 MED ORDER — GABAPENTIN 300 MG PO CAPS: 300.0000 mg | ORAL_CAPSULE | Freq: Every day | ORAL | Status: AC | PRN

## 2019-08-09 MED ORDER — TRESIBA FLEXTOUCH 100 UNIT/ML ~~LOC~~ SOPN: 10.0000 [IU] | PEN_INJECTOR | Freq: Every morning | SUBCUTANEOUS | Status: AC

## 2019-08-09 MED ORDER — FEXOFENADINE HCL 180 MG PO TABS: 180.0000 mg | ORAL_TABLET | Freq: Every day | ORAL | Status: AC | PRN

## 2019-08-09 NOTE — Progress Notes (Signed)
Subjective:  Patient has no GI complaints but she is complaining of right frontal and occipital headache.  She states she has had a runny nose for the last several weeks and this morning she did notice epistaxis when she clear her nose.  She says Dr. Wynetta Emery is not aware as he had seen her earlier today.  Denies nausea vomiting or abdominal pain.  No melena or rectal bleeding reported.  Objective: Blood pressure (!) 127/53, pulse 63, temperature 98.1 F (36.7 C), resp. rate 16, height '5\' 2"'  (1.575 m), weight 106 kg, SpO2 95 %. Patient is alert.  She is sitting at bedside. Abdomen is full but soft and nontender with organomegaly or masses..  Labs/studies Results:  CBC Latest Ref Rng & Units 08/09/2019 08/08/2019 08/07/2019  WBC 4.0 - 10.5 K/uL 3.6(L) 4.2 3.4(L)  Hemoglobin 12.0 - 15.0 g/dL 8.0(L) 8.4(L) 8.2(L)  Hematocrit 36.0 - 46.0 % 25.3(L) 27.0(L) 25.7(L)  Platelets 150 - 400 K/uL 121(L) 140(L) 115(L)    CMP Latest Ref Rng & Units 08/09/2019 08/08/2019 08/07/2019  Glucose 70 - 99 mg/dL 106(H) 91 135(H)  BUN 8 - 23 mg/dL 48(H) 33(H) 75(H)  Creatinine 0.44 - 1.00 mg/dL 7.81(H) 5.89(H) 9.64(H)  Sodium 135 - 145 mmol/L 132(L) 133(L) 135  Potassium 3.5 - 5.1 mmol/L 4.5 4.3 5.9(H)  Chloride 98 - 111 mmol/L 93(L) 93(L) 100  CO2 22 - 32 mmol/L '24 28 23  ' Calcium 8.9 - 10.3 mg/dL 8.1(L) 7.9(L) 8.2(L)  Total Protein 6.5 - 8.1 g/dL - - -  Total Bilirubin 0.3 - 1.2 mg/dL - - -  Alkaline Phos 38 - 126 U/L - - -  AST 15 - 41 U/L - - -  ALT 0 - 44 U/L - - -    Hepatic Function Latest Ref Rng & Units 08/09/2019 08/08/2019 05/04/2019  Total Protein 6.5 - 8.1 g/dL - - 6.9  Albumin 3.5 - 5.0 g/dL 3.7 3.5 3.7  AST 15 - 41 U/L - - 19  ALT 0 - 44 U/L - - 16  Alk Phosphatase 38 - 126 U/L - - 83  Total Bilirubin 0.3 - 1.2 mg/dL - - 0.5    Serum haptoglobin 119 which is normal. Serum LDH 164.  Assessment:  #1.  Acute on chronic anemia.  Patient has required multiple transfusions over the last few  years.  She has received 2 units of PRBCs this admission.  She underwent EGD yesterday with APC ablation of gastric antral vascular ectasia but she was not bleeding from these lesions.  I believe anemia is not solely due to chronic GI blood loss and she may also have chronic disease anemia as well.  Serum haptoglobin and LDH are normal therefore unlikely that she has hemolysis. H&H gradually drifting down.  Given her clinical course it would be a good idea to give another unit of PRBCs if possible which may delay her next transfusion and her hospitalization. Please note that in the past have recommended for her to go to [facility] for small bowel endoscopy but she has declined  #2.  End-stage renal disease.  Patient due for hemodialysis today.  #3.  Pancytopenia.  She may have an underlying liver disease.  No recent ultrasound on file.  EGD did not reveal esophageal or gastric varices.  Would consider ultrasound on an outpatient basis.  #4.  Right frontal and occipital headache.  She had head CT yesterday and there was no evidence of sinusitis.  She experienced epistaxis this morning  when she clear her nose.  I wonder if she has allergic rhinitis.  Discussed with Dr. Wynetta Emery will make recommendations.   Recommendations  Consider another unit of PRBCs before she is discharged.  If not we will continue to follow her H&H and arrange for transfusion as needed. Nominal ultrasound with elastography on an outpatient basis. I would also recommend that staff at assisted living examiner stool for 1 week in let me know if she has melena or bright red blood per rectum.  Please note that patient has impaired vision and unable to do so.

## 2019-08-09 NOTE — Progress Notes (Signed)
[name]  Assessment/ Plan: Pt is a 68 y.o. yo female  with history of hypertension, DM, anxiety, anemia due to recurrent GI bleed from gastric antral vascular ectasia, ESRD on HD MWF at [facility] presented from SNF with a complaint of chest pain found to have hemoglobin of 6.7.  #ESRD on HD MWF: Status post HD 12/1 off schedule with UF of 3.5 L; getting HD today 12/3, can resume OP HD tomorrow 12/4 on schedule.    #Anemia due to GI bleed and CKD: Received 2 units of blood transfusion.  s/p EGD with APC of GAVE.  Getting another 1 u pRBCs with HD today.    #Hypertension/volume: Blood pressure acceptable. Continue home medication. UF during HD.  #Secondary hyperparathyroidism: Phosphorus level acceptable. Continue binders.  #Dispo: OK to go from renal perspective at any time.  Subjective: s/p EGD with APC ablation of gastric antral vascular ectasia.  Objective Vital signs in last 24 hours: Vitals:   08/08/19 2029 08/08/19 2112 08/09/19 0456 08/09/19 0500  BP:  (!) 152/58 (!) 127/53   Pulse:  64 63   Resp:  17 16   Temp:  98.1 F (36.7 C) 98.1 F (36.7 C)   TempSrc:      SpO2: (!) 88% 100% 95%   Weight:    106 kg  Height:       Weight change: -0.516 kg No intake or output data in the 24 hours ending 08/09/19 1137     Labs: Basic Metabolic Panel: Recent Labs  Lab 08/07/19 1347 08/08/19 0434 08/09/19 0451  NA 135 133* 132*  K 5.9* 4.3 4.5  CL 100 93* 93*  CO2 23 28 24   GLUCOSE 135* 91 106*  BUN 75* 33* 48*  CREATININE 9.64* 5.89* 7.81*  CALCIUM 8.2* 7.9* 8.1*  PHOS  --  4.5  4.5 6.3*   Liver Function Tests: Recent Labs  Lab 08/08/19 0434 08/09/19 0451  ALBUMIN 3.5 3.7   No results for input(s): LIPASE, AMYLASE in the last 168 hours. No results for input(s): AMMONIA in the last 168 hours. CBC: Recent Labs  Lab 08/06/19 1121 08/07/19 1347 08/08/19 0434 08/09/19 0451  WBC 3.9* 3.4* 4.2 3.6*  HGB 6.7*  8.2* 8.4* 8.0*  HCT 21.9* 25.7* 27.0* 25.3*  MCV 102.8* 95.9 96.1 94.8  PLT 131* 115* 140* 121*   Cardiac Enzymes: No results for input(s): CKTOTAL, CKMB, CKMBINDEX, TROPONINI in the last 168 hours. CBG: Recent Labs  Lab 08/08/19 1434 08/08/19 1630 08/08/19 2022 08/09/19 0305 08/09/19 0739  GLUCAP 76 104* 132* 110* 114*    Iron Studies: No results for input(s): IRON, TIBC, TRANSFERRIN, FERRITIN in the last 72 hours. Studies/Results: Ct Head Wo Contrast  Result Date: 08/08/2019 CLINICAL DATA:  Pt says she's been waking up with a headache every morning for at least 1 month, no known triggers. EXAM: CT HEAD WITHOUT CONTRAST TECHNIQUE: Contiguous axial images were obtained from the base of the skull through the vertex without intravenous contrast. COMPARISON:  None. FINDINGS: Brain: No evidence of acute infarction, hemorrhage, hydrocephalus, extra-axial collection or mass lesion/mass effect. Are mild areas of predominantly periventricular white matter hypoattenuation consistent with chronic microvascular ischemic change. Vascular: No hyperdense vessel or unexpected calcification. Skull: Normal. Negative for fracture or focal lesion. Sinuses/Orbits: Globes and orbits are unremarkable. The sinuses and mastoid air cells are clear. Other: None. IMPRESSION: 1. No acute intracranial abnormalities. 2. Mild chronic microvascular ischemic change. Electronically Signed   By: Shanon Brow  [place]rmond M.D.   On: 08/08/2019 10:32    Medications: Infusions: . sodium chloride    . sodium chloride    . sodium chloride      Scheduled Medications: . sodium chloride   Intravenous Once  . ALPRAZolam  0.25 mg Oral BID  . amLODipine  5 mg Oral QHS  . buPROPion  75 mg Oral Daily  . carvedilol  6.25 mg Oral BID WC  . Chlorhexidine Gluconate Cloth  6 each Topical Q0600  . Chlorhexidine Gluconate Cloth  6 each Topical Q0600  . furosemide  40 mg Oral Daily  . insulin aspart  0-9 Units Subcutaneous TID WC  . insulin  glargine  10 Units Subcutaneous Daily  . pantoprazole  40 mg Oral BID  . sertraline  75 mg Oral Daily  . sevelamer carbonate  2,400 mg Oral TID WC   And  . sevelamer carbonate  1,600 mg Oral BID    have reviewed scheduled and prn medications.  Physical Exam: General:NAD, comfortable Heart:RRR, s1s2 nl Lungs:clear anteriorly Abdomen:soft, Non-tender, non-distended Extremities: 1+ LE edema to the shin Dialysis Access:  RUE AV fistula has good thrill and bruit Neurology: Alert, awake and following commands, some mild asterixis.  Rishard Delange 08/09/2019,11:37 AM  LOS: 2 days  Pager: [phone]

## 2019-08-09 NOTE — TOC Transition Note (Signed)
Transition of Care [facility]) - CM/SW Discharge Note   Patient Details  Name: SUANN KLIER MRN: [id number] Date of Birth: April 27, 1951  [facility] CM/SW Contact:  Ihor Gully, LCSW Phone Number: 08/09/2019, 2:02 PM   Clinical Narrative:    Discharge cllinicals sent to facility. Juliann Pulse at [facility] notified of discharge.  Facility to pick patient up when RN is ready.   Final next level of care: Assisted Living Barriers to Discharge: Continued Medical Work up   Patient Goals and CMS Choice        Discharge Placement                       Discharge Plan and Services                                     Social Determinants of Health (SDOH) Interventions     Readmission Risk Interventions No flowsheet data found.

## 2019-08-09 NOTE — NC FL2 (Signed)
[place] LEVEL OF CARE SCREENING TOOL     IDENTIFICATION  Patient Name: Tanya Lucero Birthdate: 11-Jun-1951 Sex: female Admission Date (Current Location): 08/06/2019  [facility] and [place] Number:  [employer] and Address:  [facility] [address] [place]      Provider Number: [id number]  Attending Physician Name and Address:  Murlean Iba, MD  Relative Name and Phone Number:       Current Level of Care: Hospital Recommended Level of Care: [place] Prior Approval Number:    Date Approved/Denied:   PASRR Number:    Discharge Plan: Domiciliary (Rest home)    Current Diagnoses: Patient Active Problem List   Diagnosis Date Noted  . Chronic occipital headaches normal neuro exam 08/08/2019  . Hyperkalemia 08/07/2019  . GERD (gastroesophageal reflux disease) 06/05/2019  . Anemia due to chronic blood loss 06/05/2019  . 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 Heme positive stool 11/20/2018  . Symptomatic anemia 11/09/2018  . ESRD on dialysis ([place]) 11/09/2018  . Anxiety and depression 11/09/2018  . Secondary hyperparathyroidism of renal origin ([place]) 11/09/2018  . Diabetic neuropathy ([place]) 11/09/2018  . Guaiac positive stools 07/19/2017  . GI bleed 07/05/2017  . Iron deficiency anemia due to chronic blood loss 10/27/2016  . RENAL INSUFFICIENCY 03/04/2010  . Type 2 diabetes with nephropathy ([place]) 02/17/2010  . Essential hypertension 02/17/2010  . OTHER MALAISE AND FATIGUE 02/17/2010  . Shortness of breath 02/17/2010  . CHEST PAIN, PRECORDIAL 02/17/2010    Orientation RESPIRATION BLADDER Height & Weight     Self, Time, Situation, Place  Normal Continent Weight: (bedscale inoperable) Height:  5\' 2"  (157.5 cm)  BEHAVIORAL SYMPTOMS/MOOD NEUROLOGICAL BOWEL NUTRITION STATUS      Continent Diet(Renal/carb modified with fluid restriction. 1238mL fluid. Fluid consistency: Thin.)   AMBULATORY STATUS COMMUNICATION OF NEEDS Skin   Limited Assist Verbally Normal                       Personal Care Assistance Level of Assistance  Bathing, Dressing, Feeding Bathing Assistance: Limited assistance Feeding assistance: Independent Dressing Assistance: Limited assistance     Functional Limitations Info  Sight, Speech, Hearing Sight Info: Adequate Hearing Info: Adequate Speech Info: Adequate    SPECIAL CARE FACTORS FREQUENCY                       Contractures Contractures Info: Not present    Additional Factors Info  Code Status, Allergies, Psychotropic Code Status Info: DNR Allergies Info: Penicillins Psychotropic Info: Wellbutrin, Zoloft, Desyrel         Current Medications (08/09/2019):  This is the current hospital active medication list Current Facility-Administered Medications  Medication Dose Route Frequency Provider Last Rate Last Dose  . 0.9 %  sodium chloride infusion  10 mL/hr Intravenous Once Fredia Sorrow, MD      . 0.9 %  sodium chloride infusion  100 mL Intravenous PRN Rosita Fire, MD      . 0.9 %  sodium chloride infusion  100 mL Intravenous PRN Rosita Fire, MD      . acetaminophen (TYLENOL) tablet 1,000 mg  1,000 mg Oral Q6H PRN Wynetta Emery, Clanford L, MD   1,000 mg at 08/09/19 0501  . albumin human 25 % solution 25 g  25 g Intravenous Once Madelon Lips, MD      . ALPRAZolam Duanne Moron) tablet 0.25 mg  0.25  mg Oral BID Emokpae, Ejiroghene E, MD   0.25 mg at 08/08/19 2203  . amLODipine (NORVASC) tablet 5 mg  5 mg Oral QHS Emokpae, Ejiroghene E, MD   5 mg at 08/08/19 2204  . buPROPion [facility]) tablet 75 mg  75 mg Oral Daily Emokpae, Ejiroghene E, MD   75 mg at 08/09/19 0845  . carvedilol (COREG) tablet 6.25 mg  6.25 mg Oral BID WC Emokpae, Ejiroghene E, MD   6.25 mg at 08/09/19 0843  . Chlorhexidine Gluconate Cloth 2 % PADS 6 each  6 each Topical Q0600 Rosita Fire, MD   6 each at 08/09/19 0503  .  Chlorhexidine Gluconate Cloth 2 % PADS 6 each  6 each Topical Q0600 Rosita Fire, MD   6 each at 08/09/19 [phone]  . furosemide (LASIX) tablet 40 mg  40 mg Oral Daily Emokpae, Ejiroghene E, MD   40 mg at 08/09/19 0844  . insulin aspart (novoLOG) injection 0-9 Units  0-9 Units Subcutaneous TID WC Emokpae, Ejiroghene E, MD   1 Units at 08/09/19 1243  . insulin glargine (LANTUS) injection 10 Units  10 Units Subcutaneous Daily Irwin Brakeman L, MD   10 Units at 08/09/19 [phone]  . lidocaine (PF) (XYLOCAINE) 1 % injection 5 mL  5 mL Intradermal PRN Rosita Fire, MD      . lidocaine-prilocaine (EMLA) cream 1 application  1 application Topical PRN Rosita Fire, MD      . ondansetron [facility]) tablet 4 mg  4 mg Oral Q6H PRN Emokpae, Ejiroghene E, MD       Or  . ondansetron (ZOFRAN) injection 4 mg  4 mg Intravenous Q6H PRN Emokpae, Ejiroghene E, MD   4 mg at 08/08/19 0948  . pantoprazole (PROTONIX) EC tablet 40 mg  40 mg Oral BID Emokpae, Ejiroghene E, MD   40 mg at 08/09/19 0844  . pentafluoroprop-tetrafluoroeth (GEBAUERS) aerosol 1 application  1 application Topical PRN Rosita Fire, MD      . polyethylene glycol (MIRALAX / GLYCOLAX) packet 17 g  17 g Oral Daily PRN Emokpae, Ejiroghene E, MD      . sertraline (ZOLOFT) tablet 75 mg  75 mg Oral Daily Emokpae, Ejiroghene E, MD   75 mg at 08/08/19 1116  . sevelamer carbonate (RENVELA) tablet 2,400 mg  2,400 mg Oral TID WC Emokpae, Ejiroghene E, MD   2,400 mg at 08/09/19 0845   And  . sevelamer carbonate (RENVELA) tablet 1,600 mg  1,600 mg Oral BID Emokpae, Ejiroghene E, MD   1,600 mg at 08/08/19 2203  . traZODone (DESYREL) tablet 25 mg  25 mg Oral QHS PRN Emokpae, Ejiroghene E, MD   25 mg at 08/08/19 2207     Discharge Medications: STOP taking these medications   glipiZIDE 5 MG tablet Commonly known as: GLUCOTROL   insulin lispro 100 UNIT/ML KiwkPen Commonly known as: HUMALOG     TAKE these medications    acetaminophen 500 MG tablet Commonly known as: TYLENOL Take 1,000 mg by mouth every 6 (six) hours as needed for mild pain (FOR HEADACHES/MINOR DISCOMFORT).   ALPRAZolam 0.25 MG tablet Commonly known as: XANAX Take 0.25 mg by mouth 2 (two) times daily.   aluminum-magnesium hydroxide-simethicone 400-867-61 MG/5ML Susp Commonly known as: MAALOX Take 30 mLs by mouth 4 (four) times daily as needed (heartburn/indigestion).   amLODipine 5 MG tablet Commonly known as: NORVASC Take 1 tablet (5 mg total) by mouth at bedtime.   b complex vitamins  capsule Take 1 capsule by mouth daily.   buPROPion 75 MG tablet Commonly known as: WELLBUTRIN Take 75 mg by mouth daily.   carvedilol 6.25 MG tablet Commonly known as: COREG Take 1 tablet (6.25 mg total) by mouth 2 (two) times a day.   cholecalciferol 25 MCG (1000 UT) tablet Commonly known as: VITAMIN D3 Take 1,000 Units by mouth daily.   fexofenadine 180 MG tablet Commonly known as: ALLEGRA Take 1 tablet (180 mg total) by mouth daily as needed for allergies or rhinitis. What changed:   when to take this  reasons to take this   furosemide 20 MG tablet Commonly known as: LASIX Take 20 mg by mouth daily as needed for edema.   furosemide 40 MG tablet Commonly known as: LASIX Take 40 mg by mouth daily.   gabapentin 300 MG capsule Commonly known as: NEURONTIN Take 1 capsule (300 mg total) by mouth daily as needed (nerve pain). Patient takes 300mg  at 0800 and at 1400; patient then takes 600mg  at 2000 What changed:   how much to take  when to take this  reasons to take this   guaiFENesin-codeine 100-10 MG/5ML syrup Commonly known as: ROBITUSSIN AC Take 5 mLs by mouth 4 (four) times daily as needed for cough.   hydroxypropyl methylcellulose / hypromellose 2.5 % ophthalmic solution Commonly known as: ISOPTO TEARS / GONIOVISC Place 1 drop into both eyes every 4 (four) hours as needed for dry eyes.   loperamide 2  MG capsule Commonly known as: IMODIUM Take 1 capsule (2 mg total) by mouth 3 (three) times daily before meals. What changed: when to take this   Melatonin 5 MG Caps Take 10 mg by mouth at bedtime.   pantoprazole 40 MG tablet Commonly known as: PROTONIX Take 1 tablet (40 mg total) by mouth 2 (two) times daily.   sertraline 50 MG tablet Commonly known as: ZOLOFT Take 75 mg by mouth daily.   sevelamer carbonate 800 MG tablet Commonly known as: RENVELA Take 1,600-2,400 mg by mouth See admin instructions. 2400 mg 3 times daily with meals and 1600 mg twice daily with snacks   traZODone 50 MG tablet Commonly known as: DESYREL Take 25 mg by mouth at bedtime as needed for sleep.   Tyler Aas FlexTouch 100 UNIT/ML Sopn FlexTouch Pen Generic drug: insulin degludec Inject 0.1 mLs (10 Units total) into the skin every morning. Start taking on: August 10, 2019 What changed: how much to take     Relevant Imaging Results:  Relevant Lab Results:   Additional Information    Korin Setzler, Clydene Pugh, LCSW

## 2019-08-09 NOTE — Discharge Summary (Addendum)
Physician Discharge Summary  Tanya Lucero RWE:[id number] DOB: 1951-03-09 DOA: 08/06/2019  PCP: Scotty Court, DO GI: Rehman  Admit date: 08/06/2019 Discharge date: 08/09/2019  Admitted From: ALF Disposition: ALF  Recommendations for Outpatient Follow-up:  1. Please resume regular outpatient hemodialysis schedule 2. Please follow up with GI as scheduled 3. Please follow up with PCP as scheduled  Staff at assisted living facility  Please examine patient's stool for 1 week and notify Dr. Laural Golden her GI specialist if she has melena or bright red blood per rectum.  Please note that patient has impaired vision and unable to do so.  Discharge Condition: STABLE   CODE STATUS: FULL    Brief Hospitalization Summary: Please see all hospital notes, images, labs for full details of the hospitalization. Dr. Talmadge Coventry HPI: Tanya Lucero is a 68 y.o. female with medical history significant for ESRD, DM2, HTN, GAVE, Anxiety and depression. Presented to the Ed with c/o central to slightly left-sided chest pain that started this morning when she was moving around trying to get ready to go for her dialysis.  Describes pain as sharp and burning.  She reports associated difficulty catching her breath.  No nausea no vomiting.  Symptoms resolved when she rested.  She reports prior episodes similar symptoms in the past with exertion. She reports black stools over the past several weeks.  No vomiting, no abdominal pain.  She denies NSAID use.  She is on Lasix and reports she still makes urine.  HD schedule Monday Wednesday Friday, her last dialysis was on Friday.  She did not get dialyzed today.   ED Course: O2 sats 90 to 96% on room air.  Hgb 6.7. HS troponin 17 > 18.  Portable chest x-ray showed chronic interstitial prominence with possible superimposed interstitial edema.  2 units PRBCs ordered for transfusion.  But there is a delay in getting blood, plans to get blood from [facility]. EDP talked to  nephrology, patient will be dialyzed tomorrow  _____________________________________________________________________  Brief Admission Hx: 69 year old female with ESRD, type 2 diabetes mellitus, hypertension, [address], anxiety depression presented complaining of shortness of breath and left-sided chest pain.  She had missed her last dialysis treatment.  She has had multiple admissions for symptomatic anemia and has received packed red blood cell transfusions.  MDM/Assessment & Plan:   1. Symptomatic anemia -patient now s/p 2 units of packed red blood cells and Hg 8.0.  Discussed with Dr. Laural Golden, will order for 1 additional unit of PRBC prior to DC.  Pt will have H/H testing and follow up with Dr. Laural Golden.  She remains on Protonix 40 mg twice daily.  GI followed during hospitalization. EGD was done 08/08/19. 2. Guaiac positive stool - EGD was done 08/08/19:  Noted below.  3. Chest pain-likely secondary to volume overload and shortness of breath.  Her troponins have been insignificant. 4. ESRD-patient missed Friday HD session.  Nephrology has been consulted and they have made arrangements for hemodialysis tomorrow which is her regularly scheduled HD day.  5. Chronic headache - Pt reporting persistent chronic occipital headache for last 3 months, Given the persistence will obtain CT head for further work up.  Analgesics ordered as needed.   6. Hyperkalemia - resolve now, treated with hemodialysis.  7. Anxiety and depression-chronic meds have been resumed. 8. Type 2 diabetes mellitus with neuropathy-she had hypoglycemia on arrival. Discontinued glipizide and reduced basal insulin to 10 units daily. Continue to monitor blood sugars at home.  9. Essential  hypertension-much better controlled.  She has been restarted on her home Coreg and Norvasc. 10. Allergic rhinitis - resuming home fexofenadine.   DVT prophylaxis: SCDs Code Status: DNR Family Communication: Patient updated at bedside no family was at the  bedside today during rounds Disposition Plan: return to ALF after hemodialysis  Consultants:  Nephrology  GI  Procedures:  Hemodialysis EGD 08/08/19 Brief EGD note. Because of the esophagus and GE junction. Nodular antral mucosa with scattered telangiectasia but without stigmata of bleed. Majority of these lesions were ablated with APC. Small polyp at gastric fundus unchanged since last EGD.  It was left alone.  Discharge Diagnoses:  Active Problems:   Shortness of breath   Iron deficiency anemia due to chronic blood loss   Guaiac positive stools   Symptomatic anemia   ESRD on dialysis (HCC)   GAVE (gastric antral vascular ectasia)   GERD (gastroesophageal reflux disease)   Anemia due to chronic blood loss   Hyperkalemia   Chronic occipital headaches normal neuro exam   Discharge Instructions:  Allergies as of 08/09/2019      Reactions   Penicillins Rash   Did it involve swelling of the face/tongue/throat, SOB, or low BP? Unknown Did it involve sudden or severe rash/hives, skin peeling, or any reaction on the inside of your mouth or nose? Unknown Did you need to seek medical attention at a hospital or doctor's office? Unknown When did it last happen?unknown If all above answers are "NO", may proceed with cephalosporin use.      Medication List    STOP taking these medications   glipiZIDE 5 MG tablet Commonly known as: GLUCOTROL   insulin lispro 100 UNIT/ML KiwkPen Commonly known as: HUMALOG     TAKE these medications   acetaminophen 500 MG tablet Commonly known as: TYLENOL Take 1,000 mg by mouth every 6 (six) hours as needed for mild pain (FOR HEADACHES/MINOR DISCOMFORT).   ALPRAZolam 0.25 MG tablet Commonly known as: XANAX Take 0.25 mg by mouth 2 (two) times daily.   aluminum-magnesium hydroxide-simethicone 196-222-97 MG/5ML Susp Commonly known as: MAALOX Take 30 mLs by mouth 4 (four) times daily as needed (heartburn/indigestion).    amLODipine 5 MG tablet Commonly known as: NORVASC Take 1 tablet (5 mg total) by mouth at bedtime.   b complex vitamins capsule Take 1 capsule by mouth daily.   buPROPion 75 MG tablet Commonly known as: WELLBUTRIN Take 75 mg by mouth daily.   carvedilol 6.25 MG tablet Commonly known as: COREG Take 1 tablet (6.25 mg total) by mouth 2 (two) times a day.   cholecalciferol 25 MCG (1000 UT) tablet Commonly known as: VITAMIN D3 Take 1,000 Units by mouth daily.   fexofenadine 180 MG tablet Commonly known as: ALLEGRA Take 1 tablet (180 mg total) by mouth daily as needed for allergies or rhinitis. What changed:   when to take this  reasons to take this   furosemide 20 MG tablet Commonly known as: LASIX Take 20 mg by mouth daily as needed for edema.   furosemide 40 MG tablet Commonly known as: LASIX Take 40 mg by mouth daily.   gabapentin 300 MG capsule Commonly known as: NEURONTIN Take 1 capsule (300 mg total) by mouth daily as needed (nerve pain). Patient takes 300mg  at 0800 and at 1400; patient then takes 600mg  at 2000 What changed:   how much to take  when to take this  reasons to take this   guaiFENesin-codeine 100-10 MG/5ML syrup Commonly known as: ROBITUSSIN  AC Take 5 mLs by mouth 4 (four) times daily as needed for cough.   hydroxypropyl methylcellulose / hypromellose 2.5 % ophthalmic solution Commonly known as: ISOPTO TEARS / GONIOVISC Place 1 drop into both eyes every 4 (four) hours as needed for dry eyes.   loperamide 2 MG capsule Commonly known as: IMODIUM Take 1 capsule (2 mg total) by mouth 3 (three) times daily before meals. What changed: when to take this   Melatonin 5 MG Caps Take 10 mg by mouth at bedtime.   pantoprazole 40 MG tablet Commonly known as: PROTONIX Take 1 tablet (40 mg total) by mouth 2 (two) times daily.   sertraline 50 MG tablet Commonly known as: ZOLOFT Take 75 mg by mouth daily.   sevelamer carbonate 800 MG  tablet Commonly known as: RENVELA Take 1,600-2,400 mg by mouth See admin instructions. 2400 mg 3 times daily with meals and 1600 mg twice daily with snacks   traZODone 50 MG tablet Commonly known as: DESYREL Take 25 mg by mouth at bedtime as needed for sleep.   Tyler Aas FlexTouch 100 UNIT/ML Sopn FlexTouch Pen Generic drug: insulin degludec Inject 0.1 mLs (10 Units total) into the skin every morning. Start taking on: August 10, 2019 What changed: how much to take      Contact information for after-discharge care    Destination    [facility] .   Service: Assisted Living Contact information: [address] [place] [address] [phone]             Allergies  Allergen Reactions  . Penicillins Rash    Did it involve swelling of the face/tongue/throat, SOB, or low BP? Unknown Did it involve sudden or severe rash/hives, skin peeling, or any reaction on the inside of your mouth or nose? Unknown Did you need to seek medical attention at a hospital or doctor's office? Unknown When did it last happen?unknown If all above answers are "NO", may proceed with cephalosporin use.     Allergies as of 08/09/2019      Reactions   Penicillins Rash   Did it involve swelling of the face/tongue/throat, SOB, or low BP? Unknown Did it involve sudden or severe rash/hives, skin peeling, or any reaction on the inside of your mouth or nose? Unknown Did you need to seek medical attention at a hospital or doctor's office? Unknown When did it last happen?unknown If all above answers are "NO", may proceed with cephalosporin use.      Medication List    STOP taking these medications   glipiZIDE 5 MG tablet Commonly known as: GLUCOTROL   insulin lispro 100 UNIT/ML KiwkPen Commonly known as: HUMALOG     TAKE these medications   acetaminophen 500 MG tablet Commonly known as: TYLENOL Take 1,000 mg by mouth every 6 (six) hours as needed for mild  pain (FOR HEADACHES/MINOR DISCOMFORT).   ALPRAZolam 0.25 MG tablet Commonly known as: XANAX Take 0.25 mg by mouth 2 (two) times daily.   aluminum-magnesium hydroxide-simethicone 355-732-20 MG/5ML Susp Commonly known as: MAALOX Take 30 mLs by mouth 4 (four) times daily as needed (heartburn/indigestion).   amLODipine 5 MG tablet Commonly known as: NORVASC Take 1 tablet (5 mg total) by mouth at bedtime.   b complex vitamins capsule Take 1 capsule by mouth daily.   buPROPion 75 MG tablet Commonly known as: WELLBUTRIN Take 75 mg by mouth daily.   carvedilol 6.25 MG tablet Commonly known as: COREG Take 1 tablet (6.25  mg total) by mouth 2 (two) times a day.   cholecalciferol 25 MCG (1000 UT) tablet Commonly known as: VITAMIN D3 Take 1,000 Units by mouth daily.   fexofenadine 180 MG tablet Commonly known as: ALLEGRA Take 1 tablet (180 mg total) by mouth daily as needed for allergies or rhinitis. What changed:   when to take this  reasons to take this   furosemide 20 MG tablet Commonly known as: LASIX Take 20 mg by mouth daily as needed for edema.   furosemide 40 MG tablet Commonly known as: LASIX Take 40 mg by mouth daily.   gabapentin 300 MG capsule Commonly known as: NEURONTIN Take 1 capsule (300 mg total) by mouth daily as needed (nerve pain). Patient takes 300mg  at 0800 and at 1400; patient then takes 600mg  at 2000 What changed:   how much to take  when to take this  reasons to take this   guaiFENesin-codeine 100-10 MG/5ML syrup Commonly known as: ROBITUSSIN AC Take 5 mLs by mouth 4 (four) times daily as needed for cough.   hydroxypropyl methylcellulose / hypromellose 2.5 % ophthalmic solution Commonly known as: ISOPTO TEARS / GONIOVISC Place 1 drop into both eyes every 4 (four) hours as needed for dry eyes.   loperamide 2 MG capsule Commonly known as: IMODIUM Take 1 capsule (2 mg total) by mouth 3 (three) times daily before meals. What changed: when to  take this   Melatonin 5 MG Caps Take 10 mg by mouth at bedtime.   pantoprazole 40 MG tablet Commonly known as: PROTONIX Take 1 tablet (40 mg total) by mouth 2 (two) times daily.   sertraline 50 MG tablet Commonly known as: ZOLOFT Take 75 mg by mouth daily.   sevelamer carbonate 800 MG tablet Commonly known as: RENVELA Take 1,600-2,400 mg by mouth See admin instructions. 2400 mg 3 times daily with meals and 1600 mg twice daily with snacks   traZODone 50 MG tablet Commonly known as: DESYREL Take 25 mg by mouth at bedtime as needed for sleep.   Tyler Aas FlexTouch 100 UNIT/ML Sopn FlexTouch Pen Generic drug: insulin degludec Inject 0.1 mLs (10 Units total) into the skin every morning. Start taking on: August 10, 2019 What changed: how much to take       Procedures/Studies: Ct Head Wo Contrast  Result Date: 08/08/2019 CLINICAL DATA:  Pt says she's been waking up with a headache every morning for at least 1 month, no known triggers. EXAM: CT HEAD WITHOUT CONTRAST TECHNIQUE: Contiguous axial images were obtained from the base of the skull through the vertex without intravenous contrast. COMPARISON:  None. FINDINGS: Brain: No evidence of acute infarction, hemorrhage, hydrocephalus, extra-axial collection or mass lesion/mass effect. Are mild areas of predominantly periventricular white matter hypoattenuation consistent with chronic microvascular ischemic change. Vascular: No hyperdense vessel or unexpected calcification. Skull: Normal. Negative for fracture or focal lesion. Sinuses/Orbits: Globes and orbits are unremarkable. The sinuses and mastoid air cells are clear. Other: None. IMPRESSION: 1. No acute intracranial abnormalities. 2. Mild chronic microvascular ischemic change. Electronically Signed   By: Lajean Manes M.D.   On: 08/08/2019 10:32   Dg Chest Portable 1 View  Result Date: 08/06/2019 CLINICAL DATA:  Chest pain EXAM: PORTABLE CHEST 1 VIEW COMPARISON:  July 04, 2019  FINDINGS: Suboptimal evaluation due to body habitus and portable technique. Chronic interstitial prominence with possible superimposed interstitial edema. Probable atelectasis at the right lung base. No pleural effusion. Stable cardiomegaly. IMPRESSION: Chronic interstitial prominence with possible superimposed interstitial  edema. Probable right basilar atelectasis. Electronically Signed   By: Macy Mis M.D.   On: 08/06/2019 11:13      Subjective: Pt says she is feeling better, she had a small nose bleed this morning but resolved now.   Discharge Exam: Vitals:   08/09/19 1145 08/09/19 1215  BP: (!) 160/55 (!) 124/51  Pulse: 67 68  Resp:    Temp:    SpO2:     Vitals:   08/09/19 0500 08/09/19 1130 08/09/19 1145 08/09/19 1215  BP:  (!) 164/80 (!) 160/55 (!) 124/51  Pulse:  71 67 68  Resp:  16    Temp:      TempSrc:  Oral    SpO2:  99%    Weight: 106 kg     Height:       General exam: Elderly female chronically ill-appearing.  No apparent distress at this time. Respiratory system: BBS CTA. No increased work of breathing. Cardiovascular system: S1 & S2 heard. No JVD, murmurs, gallops, clicks or pedal edema. Gastrointestinal system: Abdomen is nondistended, soft and nontender. Normal bowel sounds heard. Central nervous system: Alert and oriented. No focal neurological deficits. Extremities: trace bilateral lower extremities.   The results of significant diagnostics from this hospitalization (including imaging, microbiology, ancillary and laboratory) are listed below for reference.     Microbiology: Recent Results (from the past 240 hour(s))  SARS CORONAVIRUS 2 (TAT 6-24 HRS) Nasopharyngeal Nasopharyngeal Swab     Status: None   Collection Time: 08/06/19 11:59 AM   Specimen: Nasopharyngeal Swab  Result Value Ref Range Status   SARS Coronavirus 2 NEGATIVE NEGATIVE Final    Comment: (NOTE) SARS-CoV-2 target nucleic acids are NOT DETECTED. The SARS-CoV-2 RNA is generally  detectable in upper and lower respiratory specimens during the acute phase of infection. Negative results do not preclude SARS-CoV-2 infection, do not rule out co-infections with other pathogens, and should not be used as the sole basis for treatment or other patient management decisions. Negative results must be combined with clinical observations, patient history, and epidemiological information. The expected result is Negative. Fact Sheet for Patients: SugarRoll.be Fact Sheet for Healthcare Providers: https://www.woods-mathews.com/ This test is not yet approved or cleared by the [place] FDA and  has been authorized for detection and/or diagnosis of SARS-CoV-2 by FDA under an Emergency Use Authorization (EUA). This EUA will remain  in effect (meaning this test can be used) for the duration of the COVID-19 declaration under Section 56 4(b)(1) of the Act, 21 U.S.C. section 360bbb-3(b)(1), unless the authorization is terminated or revoked sooner. Performed at [facility], [place] [address], [place], [place] [address]      Labs: BNP (last 3 results) No results for input(s): BNP in the last 8760 hours. Basic Metabolic Panel: Recent Labs  Lab 08/06/19 1121 08/07/19 1347 08/08/19 0434 08/09/19 0451  NA 136 135 133* 132*  K 5.4* 5.9* 4.3 4.5  CL 97* 100 93* 93*  CO2 25 23 28 24   GLUCOSE 159* 135* 91 106*  BUN 66* 75* 33* 48*  CREATININE 9.09* 9.64* 5.89* 7.81*  CALCIUM 8.9 8.2* 7.9* 8.1*  PHOS  --   --  4.5  4.5 6.3*   Liver Function Tests: Recent Labs  Lab 08/08/19 0434 08/09/19 0451  ALBUMIN 3.5 3.7   No results for input(s): LIPASE, AMYLASE in the last 168 hours. No results for input(s): AMMONIA in the last 168 hours. CBC: Recent Labs  Lab 08/06/19 1121 08/07/19 1347 08/08/19  7858 08/09/19 0451  WBC 3.9* 3.4* 4.2 3.6*  HGB 6.7* 8.2* 8.4* 8.0*  HCT 21.9* 25.7* 27.0* 25.3*  MCV 102.8* 95.9 96.1 94.8  PLT  131* 115* 140* 121*   Cardiac Enzymes: No results for input(s): CKTOTAL, CKMB, CKMBINDEX, TROPONINI in the last 168 hours. BNP: Invalid input(s): POCBNP CBG: Recent Labs  Lab 08/08/19 1630 08/08/19 2022 08/09/19 0305 08/09/19 0739 08/09/19 1213  GLUCAP 104* 132* 110* 114* 146*   D-Dimer No results for input(s): DDIMER in the last 72 hours. Hgb A1c No results for input(s): HGBA1C in the last 72 hours. Lipid Profile No results for input(s): CHOL, HDL, LDLCALC, TRIG, CHOLHDL, LDLDIRECT in the last 72 hours. Thyroid function studies No results for input(s): TSH, T4TOTAL, T3FREE, THYROIDAB in the last 72 hours.  Invalid input(s): FREET3 Anemia work up No results for input(s): VITAMINB12, FOLATE, FERRITIN, TIBC, IRON, RETICCTPCT in the last 72 hours. Urinalysis No results found for: COLORURINE, APPEARANCEUR, [place], [place], [place], [place], [place], [place], PROTEINUR, UROBILINOGEN, NITRITE, LEUKOCYTESUR Sepsis Labs Invalid input(s): PROCALCITONIN,  WBC,  LACTICIDVEN Microbiology Recent Results (from the past 240 hour(s))  SARS CORONAVIRUS 2 (TAT 6-24 HRS) Nasopharyngeal Nasopharyngeal Swab     Status: None   Collection Time: 08/06/19 11:59 AM   Specimen: Nasopharyngeal Swab  Result Value Ref Range Status   SARS Coronavirus 2 NEGATIVE NEGATIVE Final    Comment: (NOTE) SARS-CoV-2 target nucleic acids are NOT DETECTED. The SARS-CoV-2 RNA is generally detectable in upper and lower respiratory specimens during the acute phase of infection. Negative results do not preclude SARS-CoV-2 infection, do not rule out co-infections with other pathogens, and should not be used as the sole basis for treatment or other patient management decisions. Negative results must be combined with clinical observations, patient history, and epidemiological information. The expected result is Negative. Fact Sheet for Patients: SugarRoll.be Fact Sheet for  Healthcare Providers: https://www.woods-mathews.com/ This test is not yet approved or cleared by the [place] FDA and  has been authorized for detection and/or diagnosis of SARS-CoV-2 by FDA under an Emergency Use Authorization (EUA). This EUA will remain  in effect (meaning this test can be used) for the duration of the COVID-19 declaration under Section 56 4(b)(1) of the Act, 21 U.S.C. section 360bbb-3(b)(1), unless the authorization is terminated or revoked sooner. Performed at [facility], [place] [address], [place], [place] [address]    Time coordinating discharge: 33 minutes   SIGNED:  Irwin Brakeman, MD  [facility] 08/09/2019, 12:42 PM How to contact the [facility] Attending or Consulting provider [place] or covering provider during after hours [place], for this patient?  1. Check the care team in [facility] and look for a) attending/consulting [facility] provider listed and b) the [facility] team listed 2. Log into [email or web address] and use [place]'s universal password to access. If you do not have the password, please contact the hospital operator. 3. Locate the [facility] provider you are looking for under [facility] and page to a number that you can be directly reached. 4. If you still have difficulty reaching the provider, please page the [facility] (Director on Call) for the Hospitalists listed on amion for assistance.

## 2019-08-09 NOTE — Discharge Instructions (Signed)
Staff at assisted living facility  Please examine stool for 1 week and notify Dr. Laural Golden her GI specialist if she has melena or bright red blood per rectum.  Please note that patient has impaired vision and unable to do so.

## 2019-08-10 LAB — BPAM RBC
Blood Product Expiration Date: 202012112359
Blood Product Expiration Date: 202012272359
Blood Product Expiration Date: 202012302359
ISSUE DATE / TIME: 202012010217
ISSUE DATE / TIME: 202012010632
ISSUE DATE / TIME: 202012031303
Unit Type and Rh: 5100
Unit Type and Rh: 5100
Unit Type and Rh: 9500

## 2019-08-10 LAB — TYPE AND SCREEN
ABO/RH(D): O POS
Antibody Screen: POSITIVE
Donor AG Type: NEGATIVE
Donor AG Type: NEGATIVE
Donor AG Type: NEGATIVE
Unit division: 0
Unit division: 0
Unit division: 0

## 2019-08-25 ENCOUNTER — Other Ambulatory Visit: Payer: Self-pay | Admitting: Physician Assistant

## 2019-09-07 DEATH — deceased

## 2019-09-11 ENCOUNTER — Ambulatory Visit: Payer: Self-pay | Admitting: Physician Assistant

## 2019-12-04 ENCOUNTER — Ambulatory Visit (INDEPENDENT_AMBULATORY_CARE_PROVIDER_SITE_OTHER): Payer: Medicare Other | Admitting: Internal Medicine

## 2020-03-30 IMAGING — CR PORTABLE RIGHT KNEE - 1-2 VIEW
2 series · 2 of 2 positions shown · non-contrast
Comparison: Right knee x-rays dated March 31, 2018.

CLINICAL DATA: Right knee pain after fall.

EXAM:
PORTABLE RIGHT KNEE - 1-2 VIEW

[ap]
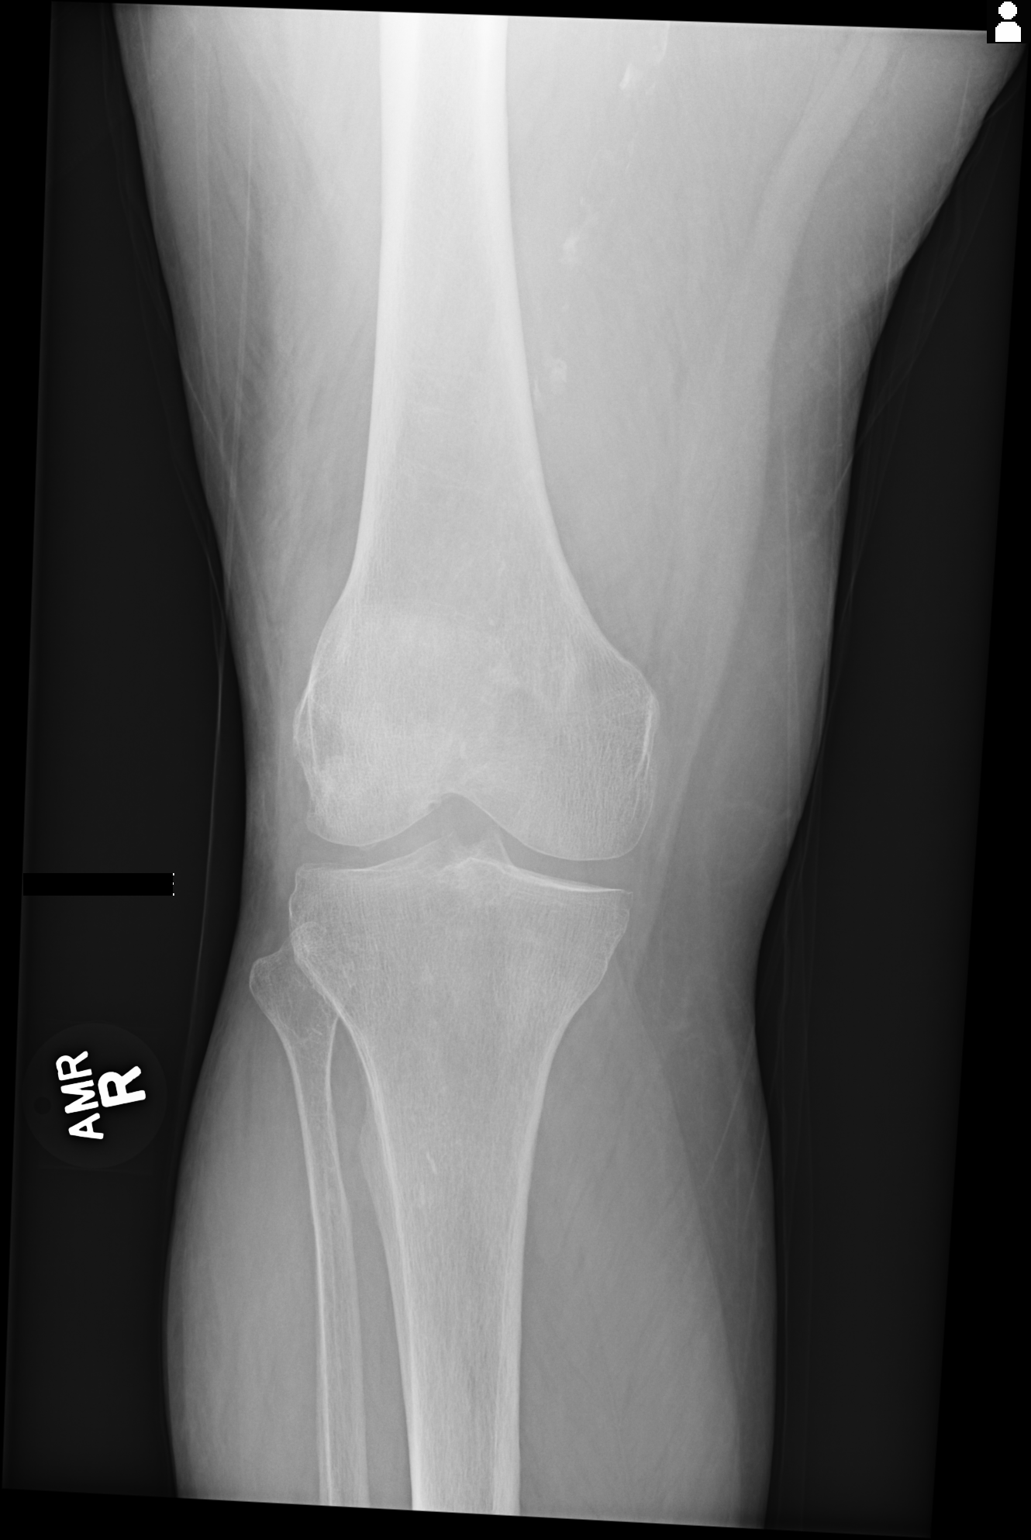

[lat]
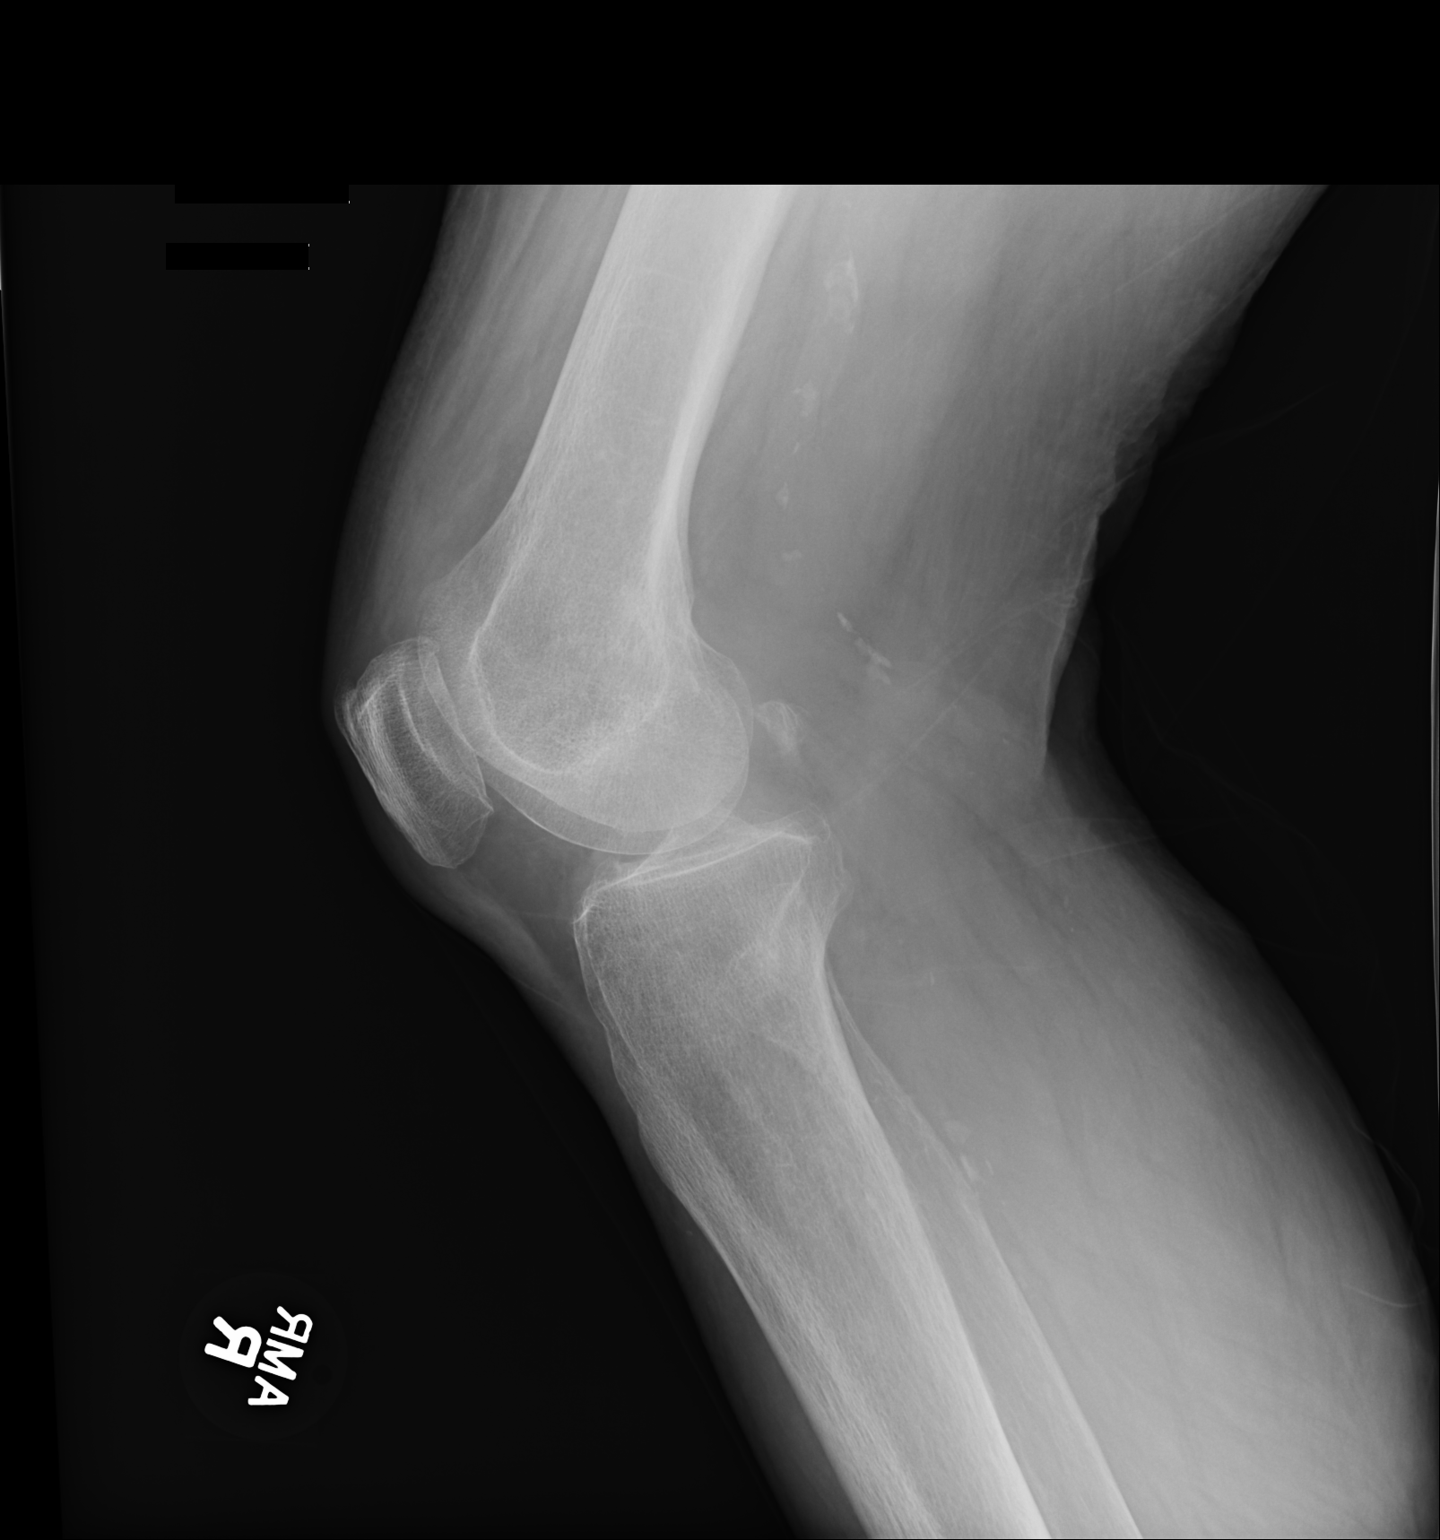

[2 of 2 positions shown; findings below may reference images not displayed]

FINDINGS: No acute fracture or dislocation. Small joint effusion. Unchanged
minimal lateral compartment joint space narrowing. Osteopenia.
Vascular calcifications.
IMPRESSION: 1. No acute osseous abnormality.  Small joint effusion.

## 2020-03-30 IMAGING — CR PORTABLE CHEST - 1 VIEW
1 series · 1 of 1 positions shown · non-contrast
Comparison: 03/14/2017

CLINICAL DATA: 68-year-old female with fall and left-sided rib pain

EXAM:
PORTABLE CHEST 1 VIEW

[ap]
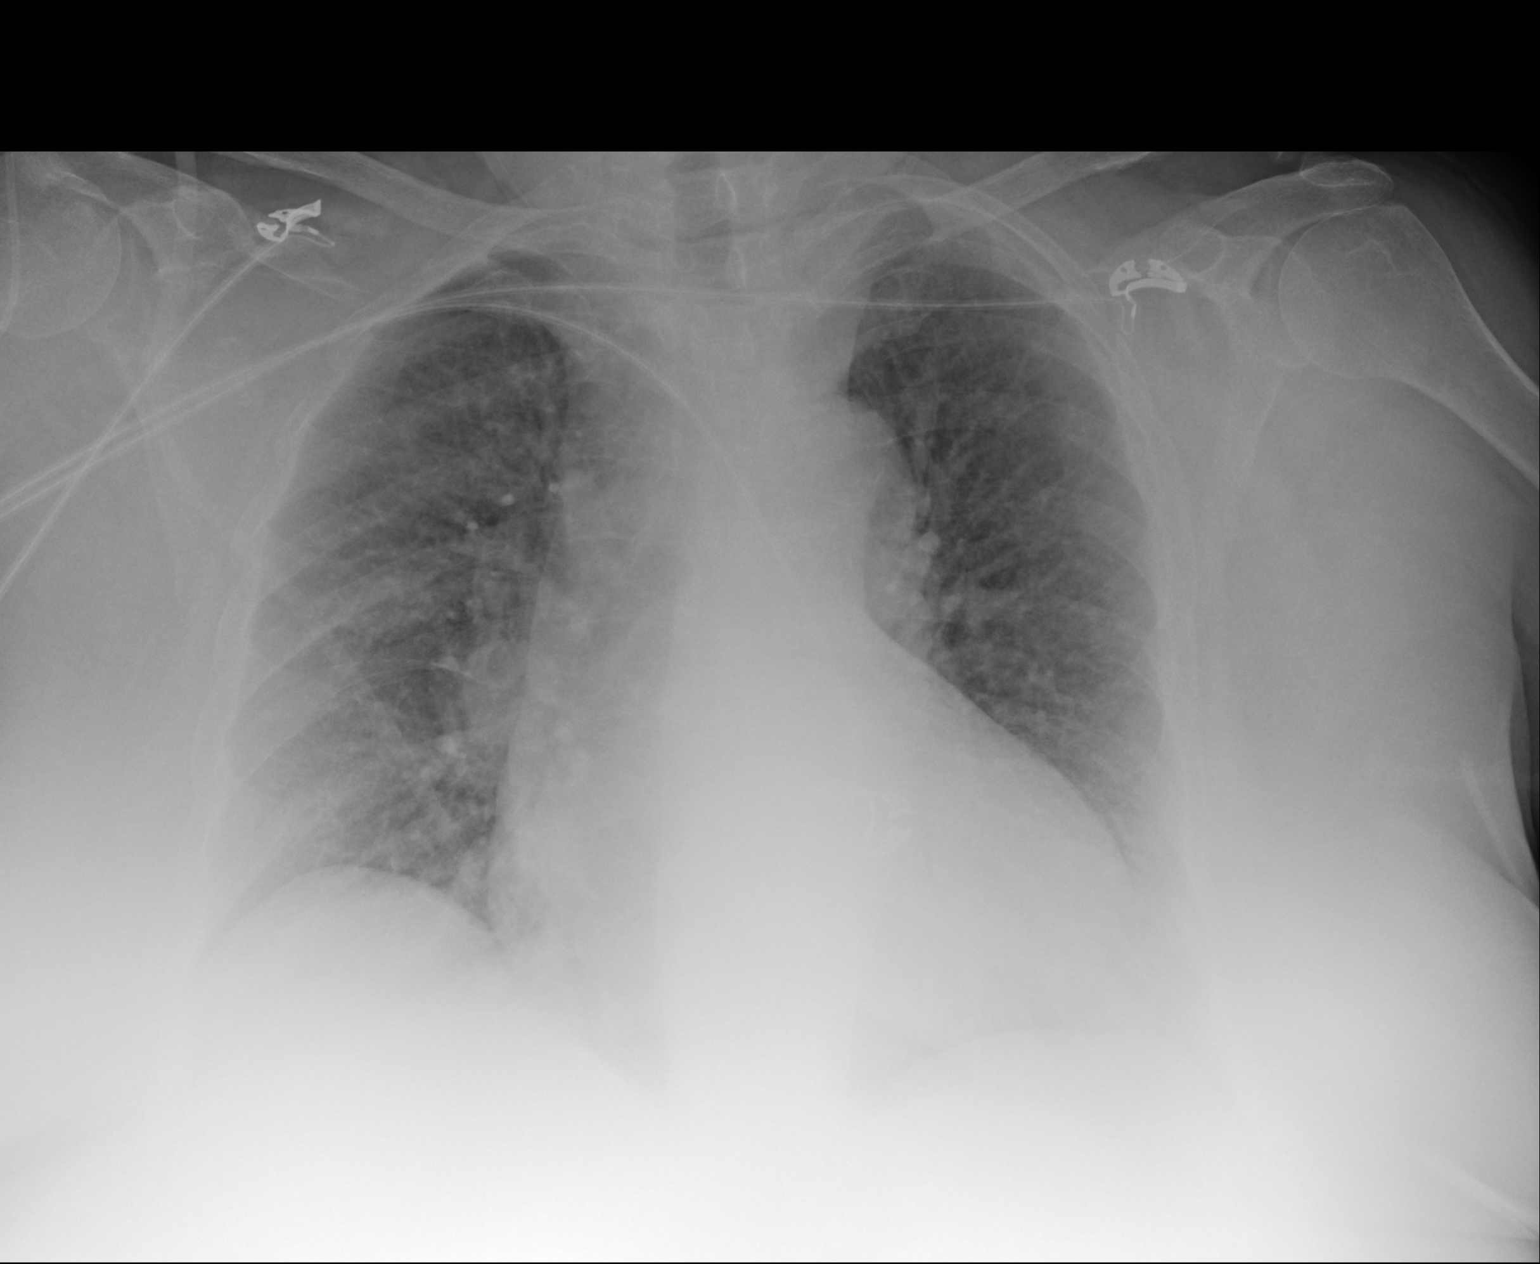

[1 of 1 positions shown; findings below may reference images not displayed]

FINDINGS: Cardiomediastinal silhouette unchanged in size and contour.

Low lung volumes with coarsened interstitial markings, similar to
the prior. No pleural effusion or pneumothorax. No confluent
airspace disease.

No acute displaced fracture identified.

Lytic changes of the left distal clavicle, unchanged
IMPRESSION: Low lung volumes with likely chronic changes and no definite
evidence of acute cardiopulmonary disease

No acute displaced fracture identified

## 2020-09-20 IMAGING — DX DG CHEST 1V PORT
1 series · 1 of 1 positions shown · non-contrast
Comparison: Prior radiograph from 04/26/2019.

CLINICAL DATA: Initial evaluation for acute shortness of breath.

EXAM:
PORTABLE CHEST 1 VIEW

[chest ap]
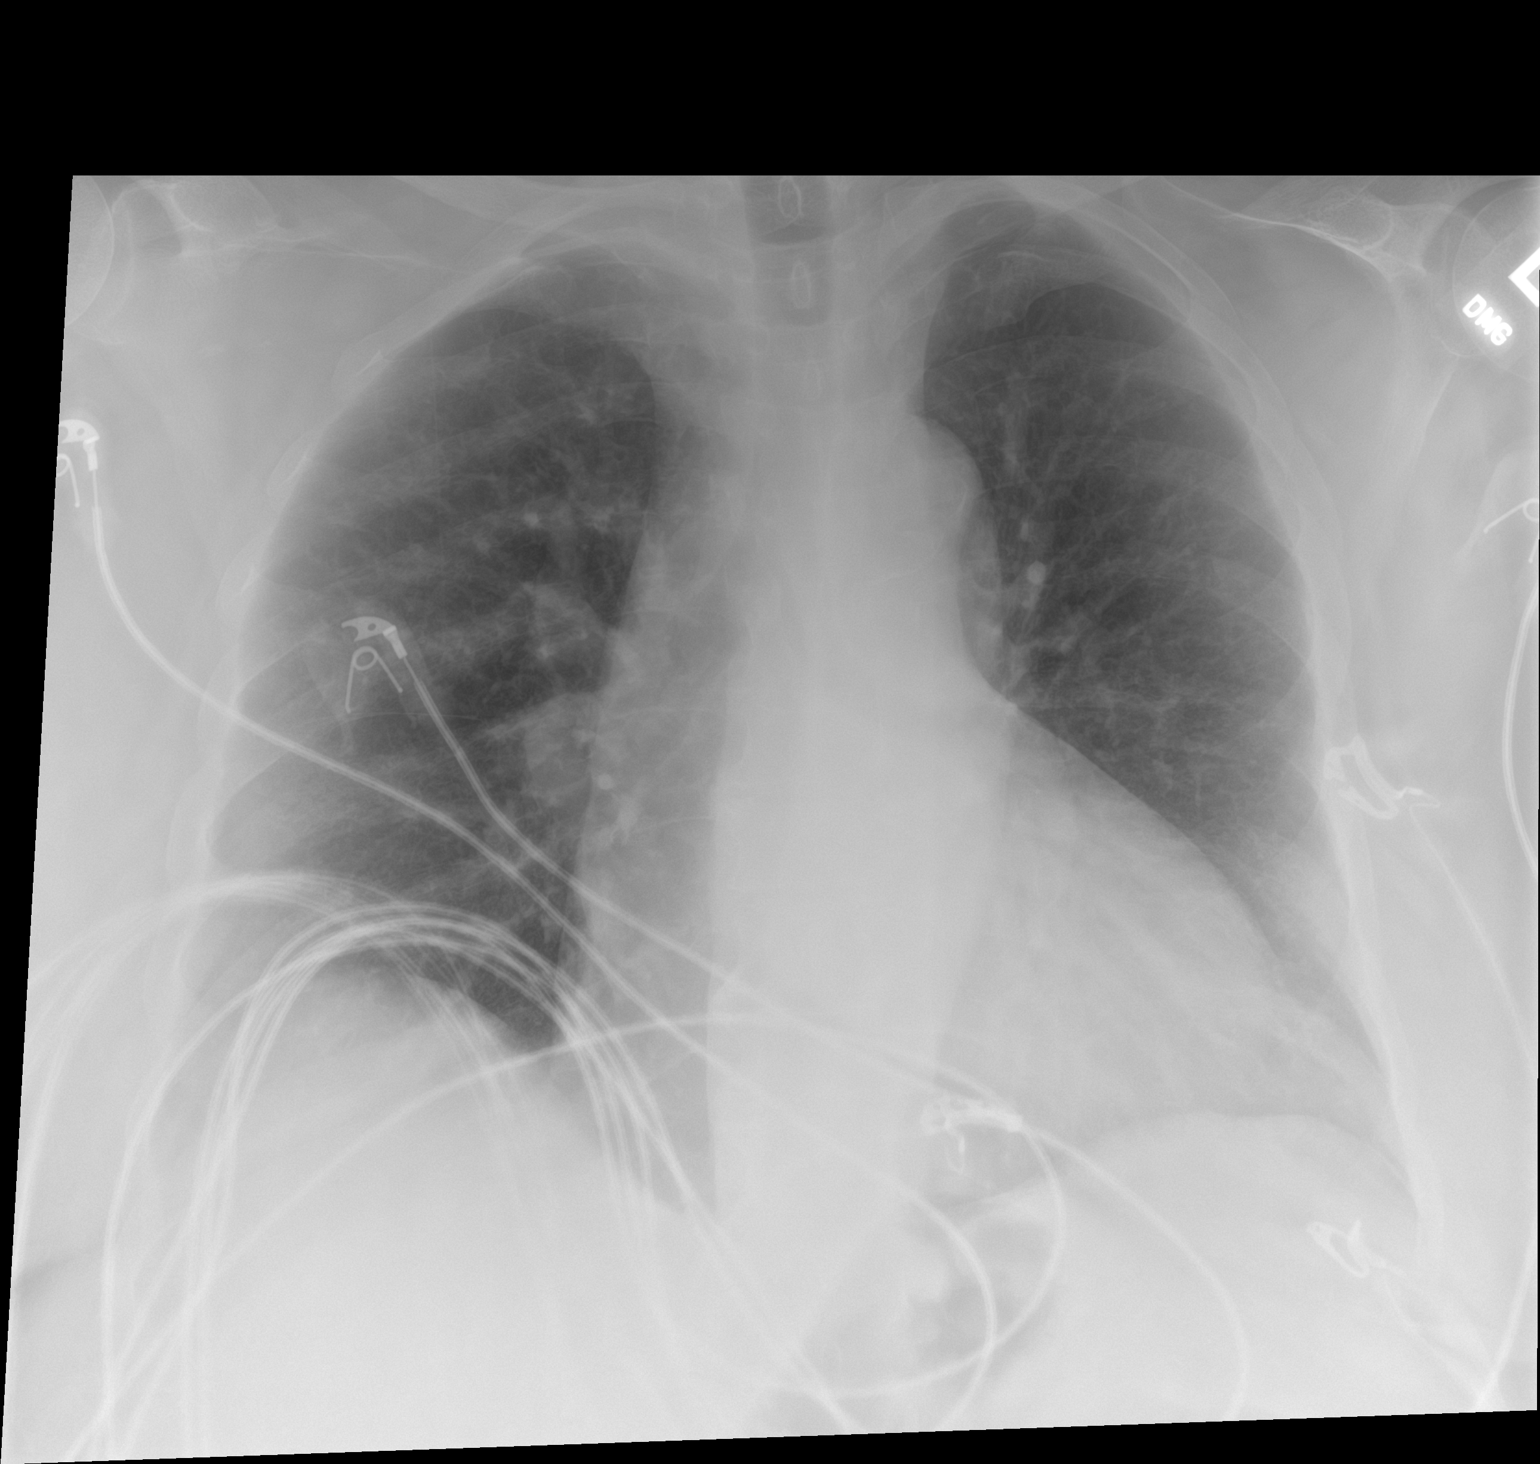

[1 of 1 positions shown; findings below may reference images not displayed]

FINDINGS: Cardiomegaly, stable.  Mediastinal silhouette within normal limits.

Lungs hypoinflated. No focal infiltrates. No edema or effusion. No
pneumothorax.

No acute osseous finding.
IMPRESSION: 1. No active cardiopulmonary disease.
2. Cardiomegaly, stable.

## 2020-10-25 IMAGING — CT CT HEAD W/O CM
3 series · 16 of 47 positions shown, 19 images · non-contrast
Comparison: None.

CLINICAL DATA: Pt says she's been waking up with a headache every
morning for at least 1 month, no known triggers.

EXAM:
CT HEAD WITHOUT CONTRAST
TECHNIQUE: Contiguous axial images were obtained from the base of the skull
through the vertex without intravenous contrast.

[Series 2: head w o · axial · 0.43mm/px · z∈[+15,+140]mm · 10 of 31 slices shown, 13 images]
[im 3/31  brain]
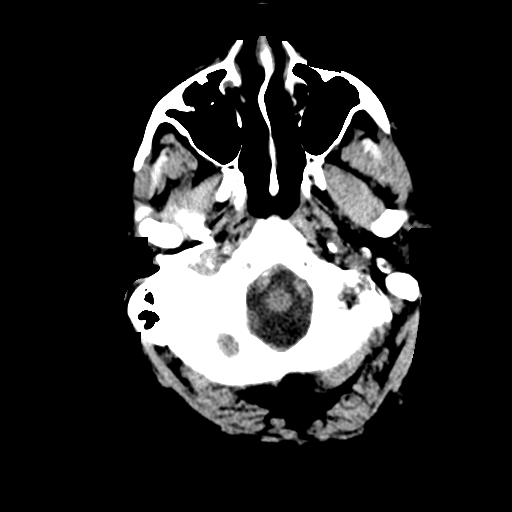
[im 3/31  bone]
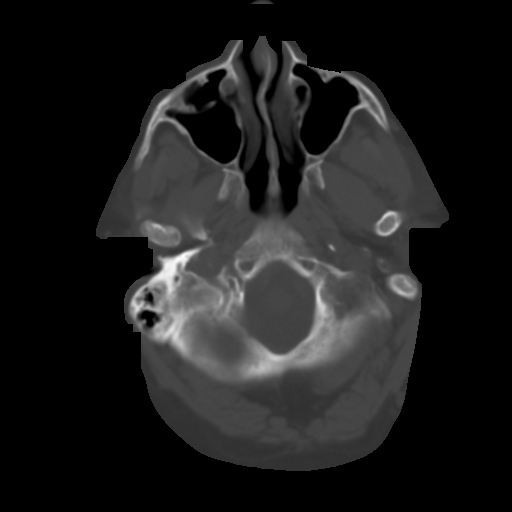
[im 6/31  brain]
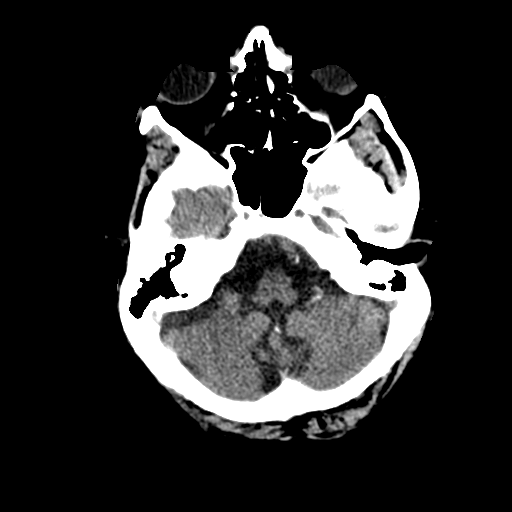
[im 9/31  brain]
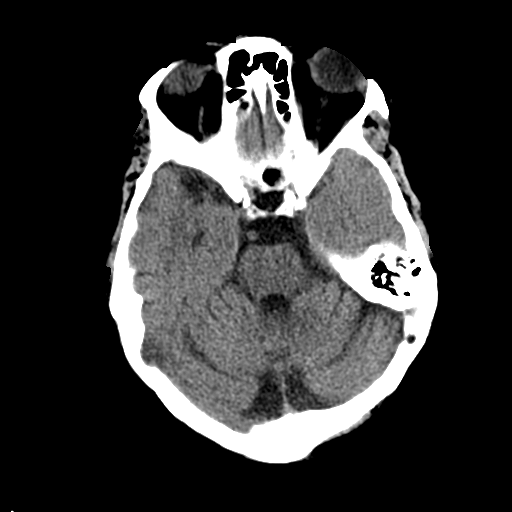
[im 11/31  brain]
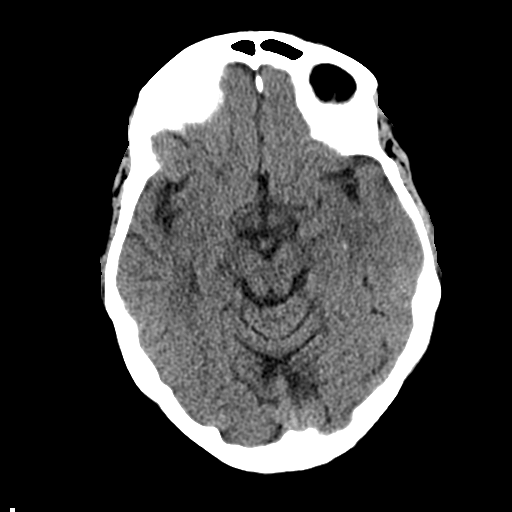
[im 14/31  brain]
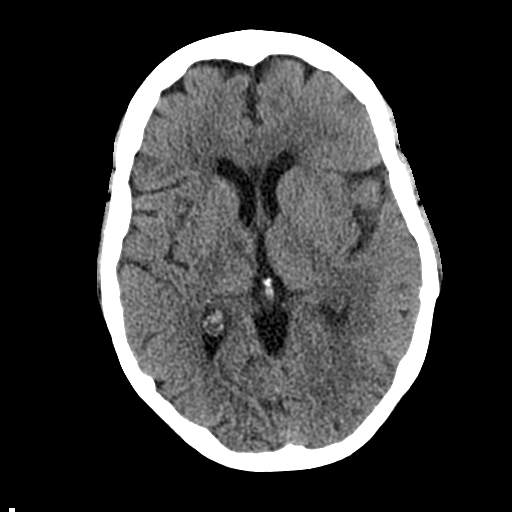
[im 14/31  bone]
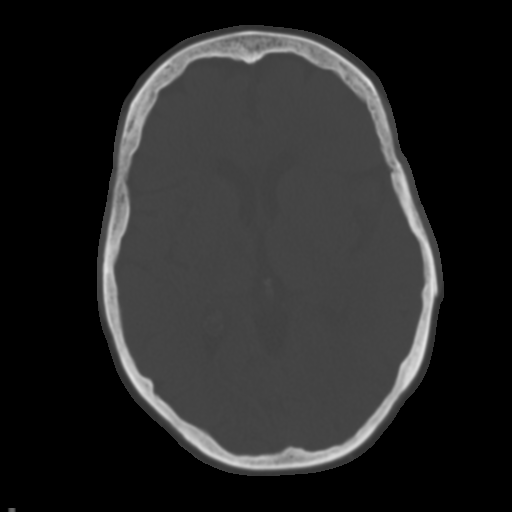
[im 17/31  brain]
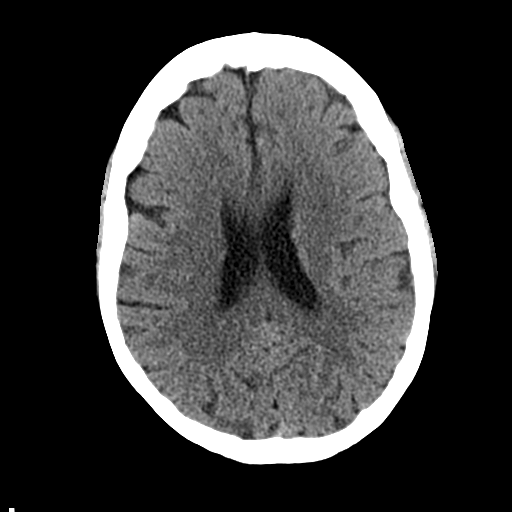
[im 20/31  brain]
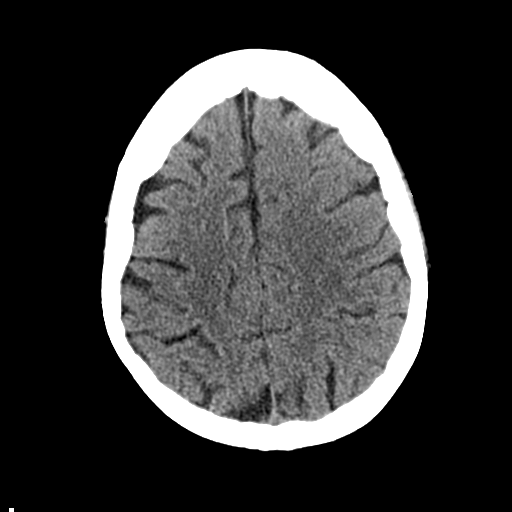
[im 23/31  brain]
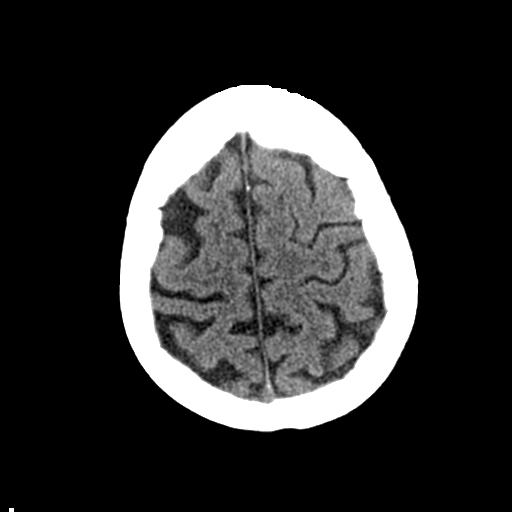
[im 25/31  brain]
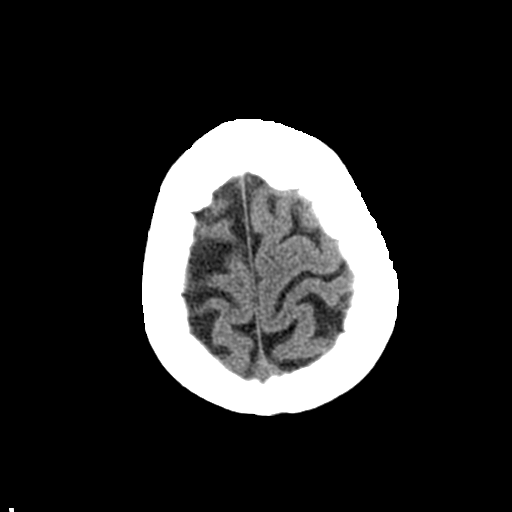
[im 25/31  bone]
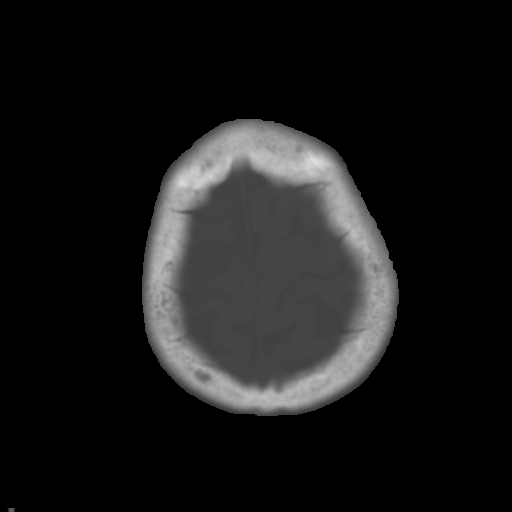
[im 28/31  brain]
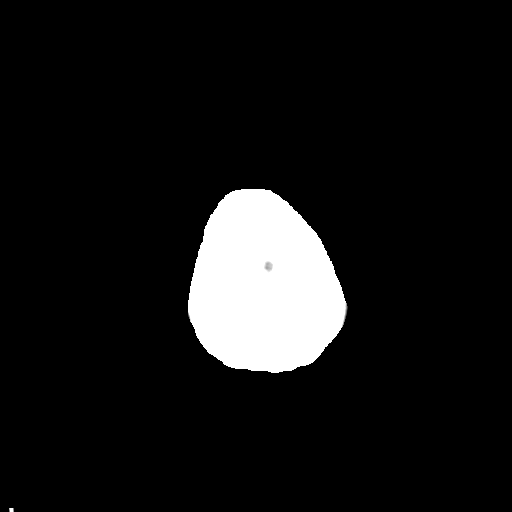

[Series 4: coronal soft · coronal · 0.32mm/px · 3 of 71 slices shown]
[im 24/71  brain]
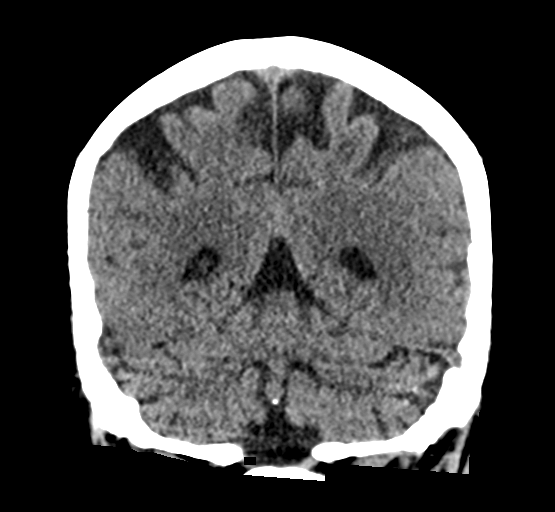
[im 32/71  brain]
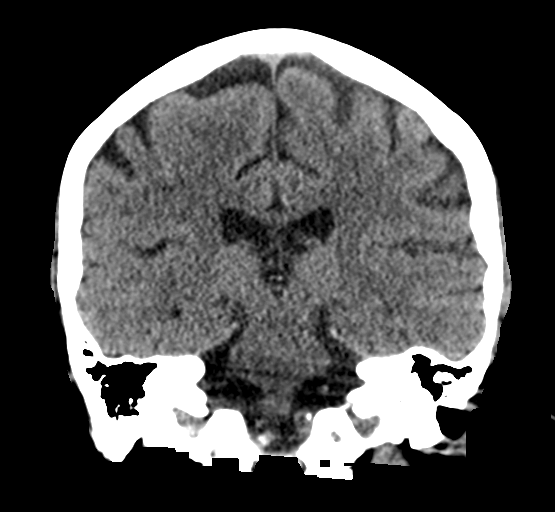
[im 39/71  brain]
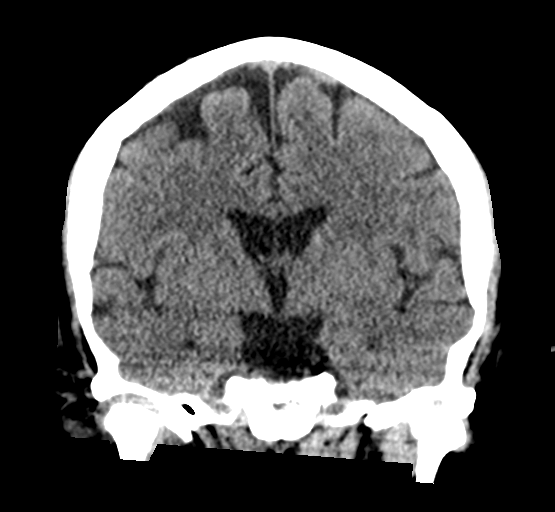

[Series 5: sagittal soft · sagittal · 0.34mm/px · 3 of 57 slices shown]
[im 19/57  brain]
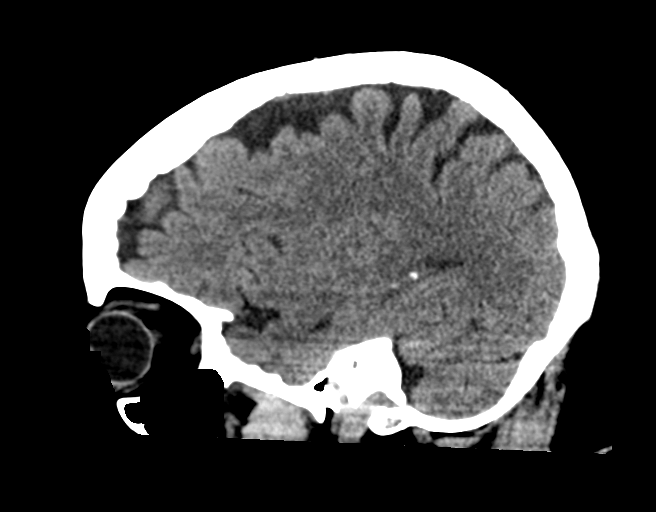
[im 29/57  brain]
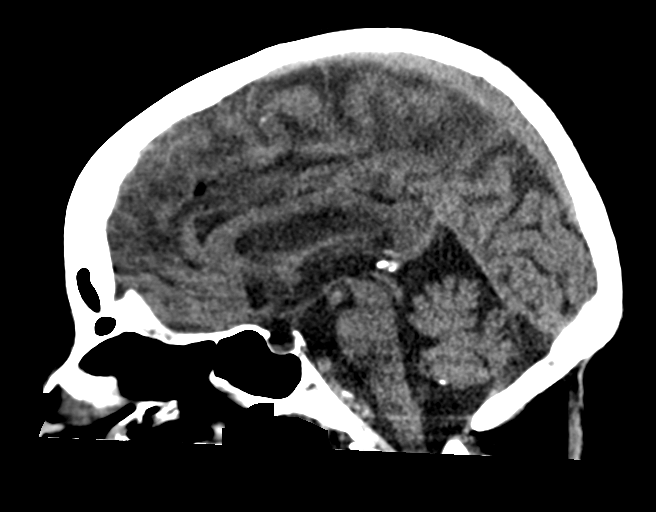
[im 38/57  brain]
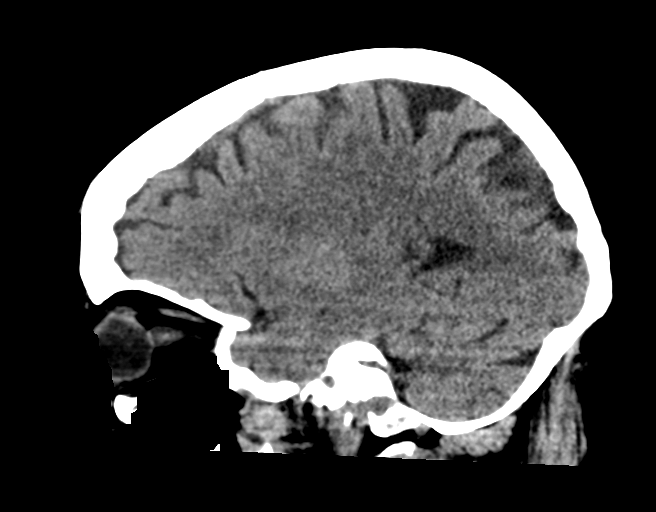

[16 of 47 positions shown; findings below may reference images not displayed]

FINDINGS: Brain: No evidence of acute infarction, hemorrhage, hydrocephalus,
extra-axial collection or mass lesion/mass effect.

Are mild areas of predominantly periventricular white matter
hypoattenuation consistent with chronic microvascular ischemic
change.

Vascular: No hyperdense vessel or unexpected calcification.

Skull: Normal. Negative for fracture or focal lesion.

Sinuses/Orbits: Globes and orbits are unremarkable. The sinuses and
mastoid air cells are clear.

Other: None.
IMPRESSION: 1. No acute intracranial abnormalities.
2. Mild chronic microvascular ischemic change.
